# Patient Record
Sex: Male | Born: 1935 | Race: Black or African American | Hispanic: No | Marital: Married | Smoking: Never smoker
Health system: Southern US, Community
[De-identification: ages and names within clinical notes are randomized; demographics above are authoritative.]

## PROBLEM LIST (undated history)

## (undated) DIAGNOSIS — D473 Essential (hemorrhagic) thrombocythemia: Secondary | ICD-10-CM

## (undated) DIAGNOSIS — R911 Solitary pulmonary nodule: Secondary | ICD-10-CM

## (undated) DIAGNOSIS — K635 Polyp of colon: Secondary | ICD-10-CM

## (undated) DIAGNOSIS — E785 Hyperlipidemia, unspecified: Secondary | ICD-10-CM

## (undated) DIAGNOSIS — I10 Essential (primary) hypertension: Secondary | ICD-10-CM

## (undated) HISTORY — DX: Essential (hemorrhagic) thrombocythemia: D47.3

## (undated) HISTORY — DX: Hyperlipidemia, unspecified: E78.5

## (undated) HISTORY — DX: Essential (primary) hypertension: I10

## (undated) HISTORY — DX: Polyp of colon: K63.5

## (undated) HISTORY — DX: Solitary pulmonary nodule: R91.1

## (undated) HISTORY — PX: COLONOSCOPY: SHX174

---

## 2002-03-01 LAB — HM COLONOSCOPY

## 2004-12-30 DIAGNOSIS — R911 Solitary pulmonary nodule: Secondary | ICD-10-CM

## 2004-12-30 HISTORY — DX: Solitary pulmonary nodule: R91.1

## 2005-01-26 ENCOUNTER — Ambulatory Visit: Payer: Self-pay | Admitting: Internal Medicine

## 2005-06-08 ENCOUNTER — Other Ambulatory Visit: Payer: Self-pay

## 2005-06-08 ENCOUNTER — Ambulatory Visit: Payer: Self-pay | Admitting: Otolaryngology

## 2005-06-10 ENCOUNTER — Ambulatory Visit: Payer: Self-pay | Admitting: Otolaryngology

## 2005-06-11 HISTORY — PX: NASAL SINUS SURGERY: SHX719

## 2006-02-16 ENCOUNTER — Ambulatory Visit: Payer: Self-pay | Admitting: Gastroenterology

## 2006-04-27 ENCOUNTER — Ambulatory Visit: Payer: Self-pay | Admitting: Infectious Diseases

## 2006-09-22 ENCOUNTER — Other Ambulatory Visit: Payer: Self-pay

## 2006-09-22 ENCOUNTER — Emergency Department: Payer: Self-pay | Admitting: Emergency Medicine

## 2006-11-30 ENCOUNTER — Ambulatory Visit: Payer: Self-pay | Admitting: Internal Medicine

## 2006-12-14 ENCOUNTER — Ambulatory Visit: Payer: Self-pay | Admitting: Internal Medicine

## 2006-12-31 ENCOUNTER — Ambulatory Visit: Payer: Self-pay | Admitting: Internal Medicine

## 2007-01-18 HISTORY — PX: BONE MARROW BIOPSY: SHX199

## 2007-01-30 ENCOUNTER — Ambulatory Visit: Payer: Self-pay | Admitting: Internal Medicine

## 2007-03-02 ENCOUNTER — Ambulatory Visit: Payer: Self-pay | Admitting: Internal Medicine

## 2007-04-02 ENCOUNTER — Ambulatory Visit: Payer: Self-pay | Admitting: Internal Medicine

## 2007-04-19 ENCOUNTER — Ambulatory Visit: Payer: Self-pay | Admitting: Internal Medicine

## 2007-04-30 ENCOUNTER — Ambulatory Visit: Payer: Self-pay | Admitting: Internal Medicine

## 2007-05-08 ENCOUNTER — Ambulatory Visit: Payer: Self-pay | Admitting: Internal Medicine

## 2007-05-31 ENCOUNTER — Ambulatory Visit: Payer: Self-pay | Admitting: Internal Medicine

## 2007-06-30 ENCOUNTER — Ambulatory Visit: Payer: Self-pay | Admitting: Internal Medicine

## 2007-07-31 ENCOUNTER — Ambulatory Visit: Payer: Self-pay | Admitting: Internal Medicine

## 2007-08-09 ENCOUNTER — Ambulatory Visit: Payer: Self-pay | Admitting: Specialist

## 2007-08-30 ENCOUNTER — Ambulatory Visit: Payer: Self-pay | Admitting: Internal Medicine

## 2007-09-13 ENCOUNTER — Ambulatory Visit: Payer: Self-pay | Admitting: Internal Medicine

## 2007-09-30 ENCOUNTER — Ambulatory Visit: Payer: Self-pay | Admitting: Internal Medicine

## 2007-10-31 ENCOUNTER — Ambulatory Visit: Payer: Self-pay | Admitting: Internal Medicine

## 2007-11-30 ENCOUNTER — Ambulatory Visit: Payer: Self-pay | Admitting: Internal Medicine

## 2007-12-08 ENCOUNTER — Ambulatory Visit: Payer: Self-pay | Admitting: Orthopedic Surgery

## 2007-12-31 ENCOUNTER — Ambulatory Visit: Payer: Self-pay | Admitting: Internal Medicine

## 2007-12-31 ENCOUNTER — Ambulatory Visit: Payer: Self-pay | Admitting: Orthopedic Surgery

## 2008-01-30 ENCOUNTER — Ambulatory Visit: Payer: Self-pay | Admitting: Orthopedic Surgery

## 2008-01-30 ENCOUNTER — Ambulatory Visit: Payer: Self-pay | Admitting: Internal Medicine

## 2008-03-01 ENCOUNTER — Ambulatory Visit: Payer: Self-pay | Admitting: Internal Medicine

## 2008-03-05 ENCOUNTER — Ambulatory Visit: Payer: Self-pay | Admitting: Internal Medicine

## 2008-03-12 ENCOUNTER — Ambulatory Visit: Payer: Self-pay | Admitting: Gastroenterology

## 2008-04-01 ENCOUNTER — Ambulatory Visit: Payer: Self-pay | Admitting: Internal Medicine

## 2008-04-29 ENCOUNTER — Ambulatory Visit: Payer: Self-pay | Admitting: Internal Medicine

## 2008-05-30 ENCOUNTER — Ambulatory Visit: Payer: Self-pay | Admitting: Internal Medicine

## 2008-06-29 ENCOUNTER — Ambulatory Visit: Payer: Self-pay | Admitting: Internal Medicine

## 2008-07-16 ENCOUNTER — Ambulatory Visit: Payer: Self-pay | Admitting: Internal Medicine

## 2008-07-30 ENCOUNTER — Ambulatory Visit: Payer: Self-pay | Admitting: Internal Medicine

## 2008-08-29 ENCOUNTER — Ambulatory Visit: Payer: Self-pay | Admitting: Internal Medicine

## 2008-09-12 ENCOUNTER — Ambulatory Visit: Payer: Self-pay | Admitting: Internal Medicine

## 2008-09-29 ENCOUNTER — Ambulatory Visit: Payer: Self-pay | Admitting: Internal Medicine

## 2008-10-30 ENCOUNTER — Ambulatory Visit: Payer: Self-pay | Admitting: Internal Medicine

## 2008-11-03 ENCOUNTER — Emergency Department: Payer: Self-pay | Admitting: Emergency Medicine

## 2008-11-29 ENCOUNTER — Ambulatory Visit: Payer: Self-pay | Admitting: Internal Medicine

## 2008-12-24 ENCOUNTER — Ambulatory Visit: Payer: Self-pay | Admitting: Internal Medicine

## 2008-12-30 ENCOUNTER — Ambulatory Visit: Payer: Self-pay | Admitting: Internal Medicine

## 2009-01-29 ENCOUNTER — Ambulatory Visit: Payer: Self-pay | Admitting: Internal Medicine

## 2009-02-26 ENCOUNTER — Ambulatory Visit: Payer: Self-pay | Admitting: Internal Medicine

## 2009-03-01 ENCOUNTER — Ambulatory Visit: Payer: Self-pay | Admitting: Internal Medicine

## 2009-04-29 ENCOUNTER — Ambulatory Visit: Payer: Self-pay | Admitting: Internal Medicine

## 2009-05-27 ENCOUNTER — Ambulatory Visit: Payer: Self-pay | Admitting: Internal Medicine

## 2009-05-30 ENCOUNTER — Ambulatory Visit: Payer: Self-pay | Admitting: Internal Medicine

## 2009-07-30 ENCOUNTER — Ambulatory Visit: Payer: Self-pay | Admitting: Internal Medicine

## 2009-08-19 ENCOUNTER — Ambulatory Visit: Payer: Self-pay | Admitting: Internal Medicine

## 2009-08-29 ENCOUNTER — Ambulatory Visit: Payer: Self-pay | Admitting: Internal Medicine

## 2009-10-14 ENCOUNTER — Ambulatory Visit: Payer: Self-pay | Admitting: Ophthalmology

## 2009-10-21 ENCOUNTER — Ambulatory Visit: Payer: Self-pay | Admitting: Ophthalmology

## 2009-10-30 ENCOUNTER — Ambulatory Visit: Payer: Self-pay | Admitting: Internal Medicine

## 2009-11-25 ENCOUNTER — Ambulatory Visit: Payer: Self-pay | Admitting: Internal Medicine

## 2009-11-29 ENCOUNTER — Ambulatory Visit: Payer: Self-pay | Admitting: Internal Medicine

## 2010-03-03 ENCOUNTER — Ambulatory Visit: Payer: Self-pay | Admitting: Internal Medicine

## 2010-04-01 ENCOUNTER — Ambulatory Visit: Payer: Self-pay | Admitting: Internal Medicine

## 2010-04-14 ENCOUNTER — Ambulatory Visit: Payer: Self-pay | Admitting: Internal Medicine

## 2010-04-24 ENCOUNTER — Ambulatory Visit: Payer: Self-pay | Admitting: Internal Medicine

## 2010-06-09 ENCOUNTER — Ambulatory Visit: Payer: Self-pay | Admitting: Internal Medicine

## 2010-06-30 ENCOUNTER — Ambulatory Visit: Payer: Self-pay | Admitting: Internal Medicine

## 2010-09-22 ENCOUNTER — Ambulatory Visit: Payer: Self-pay | Admitting: Internal Medicine

## 2010-09-30 ENCOUNTER — Ambulatory Visit: Payer: Self-pay | Admitting: Internal Medicine

## 2010-10-13 ENCOUNTER — Encounter: Payer: Self-pay | Admitting: Internal Medicine

## 2010-11-20 ENCOUNTER — Ambulatory Visit (INDEPENDENT_AMBULATORY_CARE_PROVIDER_SITE_OTHER): Payer: PRIVATE HEALTH INSURANCE | Admitting: Internal Medicine

## 2010-11-20 ENCOUNTER — Encounter: Payer: Self-pay | Admitting: Internal Medicine

## 2010-11-20 DIAGNOSIS — L723 Sebaceous cyst: Secondary | ICD-10-CM

## 2010-11-20 DIAGNOSIS — L729 Follicular cyst of the skin and subcutaneous tissue, unspecified: Secondary | ICD-10-CM

## 2010-11-20 MED ORDER — DOXYCYCLINE HYCLATE 50 MG PO CAPS: 50.0000 mg | ORAL_CAPSULE | Freq: Two times a day (BID) | ORAL | Status: AC

## 2010-11-20 NOTE — Patient Instructions (Signed)
You may change the dressing tomorrow and apply a bandage.  There were no tick particles found in the cyst.   I have prescribed doxycycline to take twice daily with food for the next 7 days to prevent infection.

## 2010-11-20 NOTE — Progress Notes (Signed)
Subjective:    Patient ID: Jimmy Jimenez, male    DOB: 03/13/1925, 77 y.o.   MRN: [id number]  HPI  77 yo AA male with history of ET, hypertension, presents with small cyst on his shoulder that has been present for one to two months and occurred after his wife removed a tick from the site. He is concerned that there are retained tick parts in the area that is causing the swelling.  He denies redness, pain, arthralgias, rashes  and fevers since receiving the tick bite.   Past Medical History  Diagnosis Date  . Lung nodule 12/2004    found on CXR  . Essential thrombocytosis   . Hyperlipidemia   . Hypertension   . Osteoporosis    Current Outpatient Prescriptions on File Prior to Visit  Medication Sig Dispense Refill  . alendronate (FOSAMAX) 70 MG tablet Take 70 mg by mouth every 7 (seven) days. Take with a full glass of water on an empty stomach.       [name] aspirin 81 MG tablet Take 81 mg by mouth daily.        . calcium carbonate (OS-CAL) 600 MG TABS Take 600 mg by mouth 2 (two) times daily with a meal.        . hydrALAZINE (APRESOLINE) 50 MG tablet Take 50 mg by mouth 3 (three) times daily.        [name] lisinopril (PRINIVIL,ZESTRIL) 40 MG tablet Take 40 mg by mouth daily.        [name] losartan (COZAAR) 100 MG tablet Take 100 mg by mouth daily.        [name] lovastatin (MEVACOR) 40 MG tablet Take 40 mg by mouth at bedtime.        . Nebivolol HCl (BYSTOLIC) 20 MG TABS Take 1 tablet by mouth daily.           Review of Systems  Constitutional: Negative for fever, chills, diaphoresis, activity change, appetite change, fatigue and unexpected weight change.  HENT: Negative for hearing loss, ear pain, nosebleeds, congestion, sore throat, facial swelling, rhinorrhea, sneezing, drooling, mouth sores, trouble swallowing, neck pain, neck stiffness, dental problem, voice change, postnasal drip, sinus pressure, tinnitus and ear discharge.   Eyes: Negative for photophobia, pain, discharge, redness, itching and visual  disturbance.  Respiratory: Negative for apnea, cough, choking, chest tightness, shortness of breath, wheezing and stridor.   Cardiovascular: Negative for chest pain, palpitations and leg swelling.  Gastrointestinal: Negative for nausea, vomiting, abdominal pain, diarrhea, constipation, blood in stool, abdominal distention, anal bleeding and rectal pain.  Genitourinary: Negative for dysuria, urgency, frequency, hematuria, flank pain, decreased urine volume, scrotal swelling, difficulty urinating and testicular pain.  Musculoskeletal: Negative for myalgias, back pain, joint swelling, arthralgias and gait problem.  Skin: Negative for color change, rash and wound.  Neurological: Negative for dizziness, tremors, seizures, syncope, speech difficulty, weakness, light-headedness, numbness and headaches.  Psychiatric/Behavioral: Negative for suicidal ideas, hallucinations, behavioral problems, confusion, sleep disturbance, dysphoric mood, decreased concentration and agitation. The patient is not nervous/anxious.   All other systems reviewed and are negative.      BP 171/72  Pulse 57  Temp(Src) 98.2 F (36.8 C) (Oral)  Resp 14  Wt 183 lb 4 oz (83.122 kg)  SpO2 96%  Objective:   Physical Exam  Constitutional: He is oriented to person, place, and time.  HENT:  Head: Normocephalic and atraumatic.  Mouth/Throat: Oropharynx is clear and moist.  Eyes: Conjunctivae and EOM are normal.  Neck: Normal range of  motion. Neck supple. No JVD present. No thyromegaly present.  Cardiovascular: Normal rate, regular rhythm and normal heart sounds.   Pulmonary/Chest: Effort normal and breath sounds normal. He has no wheezes. He has no rales.  Abdominal: Soft. Bowel sounds are normal. He exhibits no mass. There is no tenderness. There is no rebound.  Musculoskeletal: Normal range of motion. He exhibits no edema.  Neurological: He is alert and oriented to person, place, and time.  Skin: Skin is warm and dry.       Psychiatric: He has a normal mood and affect.          Assessment & Plan:  Cyst:  The cyst was incised and probed today under sterile conditions after informed consent was obtained.  No immediate vomplicatiosn and no tick parts were found.                                                                                     [facility] [address] [place] [address] Phone: [phone] Fax: [phone]   11/20/2010   I hereby consent to, and authorize:  Duncan Dull, MD  to perform the following procedure:   Incision and drainage of cyst  on patient:  Jimmy Jimenez [id number]   utilizing the following anesthetic or anesthesia:  Local  administered by the following individual:  Duncan Dull, MD  My physician has explained the following to me in language that I understand:  The nature of the treatment/procedure, the risks of the procedure, the possible complications of the procedure, the expected benefits or effects of the treatment/procedure, and any alternatives to the procedure and their risks and benefits.  I understand that no guarantees have been made to me concerning the results of treatment, surgery, or other procedures.  If unforeseen conditions require additional procedures, and it is not reasonably practical to obtain my consent, I authorize my physician to proceed as he/she considers advisable and in my best interest, unless otherwise specified as follows.  I have read the previous information, and I understand it.  Any questions which may have occurred to me have been answered to my satisfaction.     _______________________________________________  ________________  ___________ Patient Signature        Date        Time  _______________________________________________  ________________  ___________ Print Name of Parent/Guardian/Legal Representative    Date         Time (relationship)  _______________________________________________   Signature of Parent/Guardian/Legal Representative      _______________________________________________  ________________  ___________ Print Name of Witness       Date       Time  _______________________________________________ Signature of Witness

## 2010-11-21 ENCOUNTER — Encounter: Payer: Self-pay | Admitting: Internal Medicine

## 2010-11-21 DIAGNOSIS — I10 Essential (primary) hypertension: Secondary | ICD-10-CM | POA: Insufficient documentation

## 2010-11-21 DIAGNOSIS — L729 Follicular cyst of the skin and subcutaneous tissue, unspecified: Secondary | ICD-10-CM | POA: Insufficient documentation

## 2010-11-21 DIAGNOSIS — D473 Essential (hemorrhagic) thrombocythemia: Secondary | ICD-10-CM | POA: Insufficient documentation

## 2010-11-21 DIAGNOSIS — E785 Hyperlipidemia, unspecified: Secondary | ICD-10-CM | POA: Insufficient documentation

## 2010-11-21 DIAGNOSIS — M810 Age-related osteoporosis without current pathological fracture: Secondary | ICD-10-CM | POA: Insufficient documentation

## 2010-11-21 NOTE — Assessment & Plan Note (Signed)
The cyst on his shoulder occurred after his wife removed a tick 2 months ago.  After receiving informed consent,  The area was cleaned with alcohol, injected with lidocaine/epi, and opened an d probed with a scalpel.  No tick body parts were founf and only cear fluid was obtained above very fibrous tissue.  Minimal bleeding occurred. Empiric doxycyline to cover against MRSA was rxd for one week.

## 2010-11-25 ENCOUNTER — Telehealth: Payer: Self-pay | Admitting: *Deleted

## 2010-11-25 NOTE — Telephone Encounter (Signed)
Patient notified

## 2010-11-25 NOTE — Telephone Encounter (Signed)
Patient was started on doxycycline on Friday. He says that since he has been itching allover and thinks that it is from the medication. He is asking if he can be switched to something else. Uses [facility] [address].

## 2010-11-25 NOTE — Telephone Encounter (Signed)
Yes, please have him stop the doxycyline.,  He does not need any more antibiotics.

## 2011-01-01 ENCOUNTER — Other Ambulatory Visit: Payer: Self-pay | Admitting: Internal Medicine

## 2011-01-01 DIAGNOSIS — I10 Essential (primary) hypertension: Secondary | ICD-10-CM

## 2011-01-01 NOTE — Telephone Encounter (Signed)
Wife called req RF of bystolic. She says patient was told to take 2 a day if BP is over 150.

## 2011-01-01 NOTE — Telephone Encounter (Signed)
Refill sent.

## 2011-01-05 ENCOUNTER — Other Ambulatory Visit: Payer: Self-pay | Admitting: Internal Medicine

## 2011-01-19 ENCOUNTER — Ambulatory Visit: Payer: Self-pay | Admitting: Internal Medicine

## 2011-01-30 ENCOUNTER — Ambulatory Visit: Payer: Self-pay | Admitting: Internal Medicine

## 2011-02-16 ENCOUNTER — Telehealth: Payer: Self-pay | Admitting: Internal Medicine

## 2011-02-16 ENCOUNTER — Ambulatory Visit (INDEPENDENT_AMBULATORY_CARE_PROVIDER_SITE_OTHER): Payer: Medicare Other | Admitting: Internal Medicine

## 2011-02-16 ENCOUNTER — Encounter: Payer: Self-pay | Admitting: Internal Medicine

## 2011-02-16 VITALS — BP 156/60 | HR 71 | Temp 98.4°F | Wt 188.0 lb

## 2011-02-16 DIAGNOSIS — M545 Low back pain: Secondary | ICD-10-CM

## 2011-02-16 DIAGNOSIS — R6883 Chills (without fever): Secondary | ICD-10-CM

## 2011-02-16 DIAGNOSIS — J069 Acute upper respiratory infection, unspecified: Secondary | ICD-10-CM | POA: Insufficient documentation

## 2011-02-16 LAB — POCT URINALYSIS DIPSTICK
Blood, UA: NEGATIVE
Spec Grav, UA: 1.02
Urobilinogen, UA: 4
pH, UA: 6

## 2011-02-16 MED ORDER — DOXYCYCLINE HYCLATE 100 MG PO TABS: 100.0000 mg | ORAL_TABLET | Freq: Two times a day (BID) | ORAL | Status: AC

## 2011-02-16 MED ORDER — BENZONATATE 200 MG PO CAPS: 200.0000 mg | ORAL_CAPSULE | Freq: Two times a day (BID) | ORAL | Status: AC | PRN

## 2011-02-16 NOTE — Telephone Encounter (Signed)
Patient's wife called stating that the patient has low back pain, no burning with urination or other urinary symptoms. Patient also has a productive cough-white, no fever, but thinks that he may have the flu. Please advise. Pharmacy [facility]

## 2011-02-16 NOTE — Telephone Encounter (Signed)
[phone] Pt called wanted to be seen today for  Back pain/chest congestion/

## 2011-02-16 NOTE — Progress Notes (Signed)
Subjective:    Patient ID: Jimmy Jimenez, male    DOB: 03/13/1925, 77 y.o.   MRN: [id number]  HPI Jimmy Jimenez is an elderly male with a hitory of hypertensin, ET who presents with cold symptoms for 2 weeks,including cough productive of yellow sputum,,  Nasal drainage often streaked with blood, and occasional back pain.  He has ben using mucines and a sinus rinse twice daily  With no major improvement.  Denies feeers and myalgias.  Has had his flu shot./   Past Medical History  Diagnosis Date  . Lung nodule 12/2004    found on CXR  . Essential thrombocytosis   . Hyperlipidemia   . Hypertension   . Osteoporosis   . Essential thrombocytosis    Current Outpatient Prescriptions on File Prior to Visit  Medication Sig Dispense Refill  . alendronate (FOSAMAX) 70 MG tablet Take 70 mg by mouth every 7 (seven) days. Take with a full glass of water on an empty stomach.       [name] aspirin 81 MG tablet Take 81 mg by mouth daily.        [name] BYSTOLIC 20 MG TABS TAKE ONE TABLET BY MOUTH EVERY DAY  45 each  5  . calcium carbonate (OS-CAL) 600 MG TABS Take 600 mg by mouth 2 (two) times daily with a meal.        . hydrALAZINE (APRESOLINE) 50 MG tablet Take 50 mg by mouth 3 (three) times daily.        [name] lisinopril (PRINIVIL,ZESTRIL) 40 MG tablet Take 40 mg by mouth daily.        [name] losartan (COZAAR) 100 MG tablet Take 100 mg by mouth daily.        [name] lovastatin (MEVACOR) 40 MG tablet Take 40 mg by mouth at bedtime.          Review of Systems  Constitutional: Negative for fever, chills, diaphoresis, activity change, appetite change, fatigue and unexpected weight change.  HENT: Positive for congestion and postnasal drip. Negative for hearing loss, ear pain, nosebleeds, sore throat, facial swelling, rhinorrhea, sneezing, drooling, mouth sores, trouble swallowing, neck pain, neck stiffness, dental problem, voice change, sinus pressure, tinnitus and ear discharge.   Eyes: Negative for photophobia, pain, discharge,  redness, itching and visual disturbance.  Respiratory: Positive for cough. Negative for apnea, choking, chest tightness, shortness of breath, wheezing and stridor.   Cardiovascular: Negative for chest pain, palpitations and leg swelling.  Gastrointestinal: Negative for nausea, vomiting, abdominal pain, diarrhea, constipation, blood in stool, abdominal distention, anal bleeding and rectal pain.  Genitourinary: Negative for dysuria, urgency, frequency, hematuria, flank pain, decreased urine volume, scrotal swelling, difficulty urinating and testicular pain.  Musculoskeletal: Positive for back pain. Negative for myalgias, joint swelling, arthralgias and gait problem.  Skin: Negative for color change, rash and wound.  Neurological: Negative for dizziness, tremors, seizures, syncope, speech difficulty, weakness, light-headedness, numbness and headaches.  Psychiatric/Behavioral: Negative for suicidal ideas, hallucinations, behavioral problems, confusion, sleep disturbance, dysphoric mood, decreased concentration and agitation. The patient is not nervous/anxious.        Objective:   Physical Exam  Constitutional: He is oriented to person, place, and time.  HENT:  Head: Normocephalic and atraumatic.  Mouth/Throat: Oropharynx is clear and moist.  Eyes: Conjunctivae and EOM are normal.  Neck: Normal range of motion. Neck supple. No JVD present. No thyromegaly present.  Cardiovascular: Normal rate, regular rhythm and normal heart sounds.   Pulmonary/Chest: Effort normal and breath sounds normal. He has  no wheezes. He has no rales.  Abdominal: Soft. Bowel sounds are normal. He exhibits no mass. There is no tenderness. There is no rebound.  Musculoskeletal: Normal range of motion. He exhibits no edema.  Neurological: He is alert and oriented to person, place, and time.  Skin: Skin is warm and dry.  Psychiatric: He has a normal mood and affect.          Assessment & Plan:

## 2011-02-16 NOTE — Patient Instructions (Addendum)
I am treating your with doxycycline,  an antibiotic for an upper respiratory infection, and tessalon,  A pill for the cough.  Continue the sinus rinse and mucinex and try moistening the inside of your nose with vaseline on a q  tip

## 2011-02-16 NOTE — Telephone Encounter (Signed)
Please give him the 4:14 SLOT TODAY,  REMIND HIM THAT THIS IS A QUICK VISIT.  GET A RAPID FLU TEST ON HIM AND A UA AS SOON AS HE COMES IN SO I HAVE THE RESULTS WHEN I SEE HIM

## 2011-02-16 NOTE — Assessment & Plan Note (Signed)
No symptoms concerning for influenza, but given the chronicity of his symptoms will treat empirically for mucopurulent bronchitis with doxycycline

## 2011-02-16 NOTE — Telephone Encounter (Signed)
Patient notified as instructed by telephone. Appointment scheduled for today per Dr. Darrick Huntsman.

## 2011-02-16 NOTE — Telephone Encounter (Signed)
Message left on machine for patient to call back.

## 2011-02-17 NOTE — Progress Notes (Signed)
Addended by: Jobie Quaker on: 02/17/2011 05:58 PM   Modules accepted: Orders

## 2011-03-23 ENCOUNTER — Other Ambulatory Visit: Payer: Self-pay | Admitting: Internal Medicine

## 2011-04-23 ENCOUNTER — Ambulatory Visit (INDEPENDENT_AMBULATORY_CARE_PROVIDER_SITE_OTHER): Payer: Medicare Other | Admitting: Internal Medicine

## 2011-04-23 ENCOUNTER — Encounter: Payer: Self-pay | Admitting: Internal Medicine

## 2011-04-23 VITALS — BP 122/52 | HR 50 | Temp 98.9°F | Wt 184.0 lb

## 2011-04-23 DIAGNOSIS — J069 Acute upper respiratory infection, unspecified: Secondary | ICD-10-CM

## 2011-04-23 DIAGNOSIS — J329 Chronic sinusitis, unspecified: Secondary | ICD-10-CM

## 2011-04-23 MED ORDER — AZITHROMYCIN 500 MG PO TABS: 500.0000 mg | ORAL_TABLET | Freq: Every day | ORAL | Status: DC

## 2011-04-23 MED ORDER — NEBIVOLOL HCL 20 MG PO TABS: 1.0000 | ORAL_TABLET | Freq: Every day | ORAL | Status: DC

## 2011-04-23 NOTE — Patient Instructions (Addendum)
Please use Benadryl (dipenhydramine) 25 mg every 8 hours for the runny nose,  And  Use Afrin nasal spray once ot twice daily for 5 days total for nasal congestion  I wil call in an antibiotic to take for 7 days  Azithromycin  one tablet  Daily for 7 days   After you finish the antbiotic, you may continue the benadryl for treatment of allergic rhinitis. If it makes you too sleepy, you can try OTC Allegra instead or claritin or zyrtec ;  They areall once daily antihistamines for allergic rhinitis.

## 2011-04-23 NOTE — Progress Notes (Signed)
Subjective:    Patient ID: Jimmy Jimenez, male    DOB: 03/13/1925, 78 y.o.   MRN: [id number]  HPI  Jimmy Jimenez is an 78 yr old white male with a history of hypertension, Essential thrombocytosis who presents with a 2 week history of persistent sinus congestion and drainage.  Symptoms are accompanied bynonproductive cough  purulent drainage occasionally streaked with blood.  He has been using saline nasal rinse twice daily since his last episode of sinusitis two months ago.  Symptoms have imporved with last antibiotic treatment using doxycycline but never completely resolved, and he felt a significant change in symptoms 2 weeks ago.   Past Medical History  Diagnosis Date  . Lung nodule 12/2004    found on CXR  . Essential thrombocytosis   . Hyperlipidemia   . Hypertension   . Osteoporosis   . Essential thrombocytosis    Current Outpatient Prescriptions on File Prior to Visit  Medication Sig Dispense Refill  . alendronate (FOSAMAX) 70 MG tablet Take 70 mg by mouth every 7 (seven) days. Take with a full glass of water on an empty stomach.       [name] aspirin 81 MG tablet Take 81 mg by mouth daily.        . calcium carbonate (OS-CAL) 600 MG TABS Take 600 mg by mouth 2 (two) times daily with a meal.        . hydrALAZINE (APRESOLINE) 50 MG tablet Take 50 mg by mouth 3 (three) times daily.        [name] lisinopril (PRINIVIL,ZESTRIL) 40 MG tablet Take 40 mg by mouth daily.        [name] losartan (COZAAR) 100 MG tablet TAKE ONE TABLET BY MOUTH EVERY DAY  30 tablet  5  . lovastatin (MEVACOR) 40 MG tablet Take 40 mg by mouth at bedtime.            Review of Systems  Constitutional: Negative for fever, chills, diaphoresis, activity change, appetite change, fatigue and unexpected weight change.  HENT: Positive for congestion, rhinorrhea and postnasal drip. Negative for hearing loss, ear pain, nosebleeds, sore throat, facial swelling, sneezing, drooling, mouth sores, trouble swallowing, neck pain, neck stiffness,  dental problem, voice change, sinus pressure, tinnitus and ear discharge.   Eyes: Negative for photophobia, pain, discharge, redness, itching and visual disturbance.  Respiratory: Positive for cough. Negative for apnea, choking, chest tightness, shortness of breath, wheezing and stridor.   Cardiovascular: Negative for chest pain, palpitations and leg swelling.  Gastrointestinal: Negative for nausea, vomiting, abdominal pain, diarrhea, constipation, blood in stool, abdominal distention, anal bleeding and rectal pain.  Genitourinary: Negative for dysuria, urgency, frequency, hematuria, flank pain, decreased urine volume, scrotal swelling, difficulty urinating and testicular pain.  Musculoskeletal: Negative for myalgias, back pain, joint swelling, arthralgias and gait problem.  Skin: Negative for color change, rash and wound.  Neurological: Negative for dizziness, tremors, seizures, syncope, speech difficulty, weakness, light-headedness, numbness and headaches.  Psychiatric/Behavioral: Negative for suicidal ideas, hallucinations, behavioral problems, confusion, sleep disturbance, dysphoric mood, decreased concentration and agitation. The patient is not nervous/anxious.        Objective:   Physical Exam  Constitutional: He is oriented to person, place, and time.  HENT:  Head: Normocephalic and atraumatic.  Mouth/Throat: Oropharynx is clear and moist.  Eyes: Conjunctivae and EOM are normal.  Neck: Normal range of motion. Neck supple. No JVD present. No thyromegaly present.  Cardiovascular: Normal rate, regular rhythm and normal heart sounds.   Pulmonary/Chest: Effort normal and  breath sounds normal. He has no wheezes. He has no rales.  Abdominal: Soft. Bowel sounds are normal. He exhibits no mass. There is no tenderness. There is no rebound.  Musculoskeletal: Normal range of motion. He exhibits no edema.  Neurological: He is alert and oriented to person, place, and time.  Skin: Skin is warm and  dry.  Psychiatric: He has a normal mood and affect.      Assessment & Plan:   Upper respiratory infection Currently appears to be viral syndrome,  Will treat symptoms, add azithromycin in a few days if no improvement     Updated Medication List Outpatient Encounter Prescriptions as of 04/23/2011  Medication Sig Dispense Refill  . alendronate (FOSAMAX) 70 MG tablet Take 70 mg by mouth every 7 (seven) days. Take with a full glass of water on an empty stomach.       [name] aspirin 81 MG tablet Take 81 mg by mouth daily.        . calcium carbonate (OS-CAL) 600 MG TABS Take 600 mg by mouth 2 (two) times daily with a meal.        . Calcium Citrate-Vitamin D (CITRACAL + D PO) Take 2 by mouth daily, calcium 630 and vitamin D 500 each.      . Cholecalciferol (VITAMIN D3) 1000 UNITS CAPS Take 2 by mouth daily      . hydrALAZINE (APRESOLINE) 50 MG tablet Take 50 mg by mouth 3 (three) times daily.        [name] lisinopril (PRINIVIL,ZESTRIL) 40 MG tablet Take 40 mg by mouth daily.        [name] losartan (COZAAR) 100 MG tablet TAKE ONE TABLET BY MOUTH EVERY DAY  30 tablet  5  . lovastatin (MEVACOR) 40 MG tablet Take 40 mg by mouth at bedtime.        . Nebivolol HCl (BYSTOLIC) 20 MG TABS Take 1 tablet (20 mg total) by mouth daily.  60 each  5  . DISCONTD: BYSTOLIC 20 MG TABS TAKE ONE TABLET BY MOUTH EVERY DAY  45 each  5  . azithromycin (ZITHROMAX) 500 MG tablet Take 1 tablet (500 mg total) by mouth daily.  7 tablet  0

## 2011-04-25 NOTE — Assessment & Plan Note (Signed)
Currently appears to be viral syndrome,  Will treat symptoms, add azithromycin in a few days if no improvement

## 2011-05-19 ENCOUNTER — Ambulatory Visit: Payer: Self-pay | Admitting: Internal Medicine

## 2011-05-19 LAB — COMPREHENSIVE METABOLIC PANEL
Albumin: 4.1 g/dL (ref 3.4–5.0)
Alkaline Phosphatase: 74 U/L (ref 50–136)
Anion Gap: 5 — ABNORMAL LOW (ref 7–16)
BUN: 8 mg/dL (ref 7–18)
Bilirubin,Total: 1.2 mg/dL — ABNORMAL HIGH (ref 0.2–1.0)
Chloride: 99 mmol/L (ref 98–107)
Co2: 32 mmol/L (ref 21–32)
EGFR (African American): >60
EGFR (Non-African Amer.): >60
Potassium: 4.3 mmol/L (ref 3.5–5.1)
SGOT(AST): 27 U/L (ref 15–37)
SGPT (ALT): 21 U/L
Total Protein: 7.4 g/dL (ref 6.4–8.2)

## 2011-05-19 LAB — CBC CANCER CENTER
Basophil %: 0.4 %
Eosinophil #: 0.2 x10 3/mm (ref 0.0–0.7)
Eosinophil %: 3.2 %
HCT: 42.1 % (ref 40.0–52.0)
HGB: 14.4 g/dL (ref 13.0–18.0)
Lymphocyte #: 1.8 x10 3/mm (ref 1.0–3.6)
MCH: 37.9 pg — ABNORMAL HIGH (ref 26.0–34.0)
MCHC: 34.3 g/dL (ref 32.0–36.0)
MCV: 111 fL — ABNORMAL HIGH (ref 80–100)
Monocyte #: 0.6 x10 3/mm (ref 0.0–0.7)
Neutrophil #: 4.6 x10 3/mm (ref 1.4–6.5)
RBC: 3.8 10*6/uL — ABNORMAL LOW (ref 4.40–5.90)

## 2011-05-23 ENCOUNTER — Other Ambulatory Visit: Payer: Self-pay | Admitting: Internal Medicine

## 2011-05-31 ENCOUNTER — Ambulatory Visit: Payer: Self-pay | Admitting: Internal Medicine

## 2011-07-01 ENCOUNTER — Telehealth: Payer: Self-pay | Admitting: Internal Medicine

## 2011-07-01 ENCOUNTER — Other Ambulatory Visit: Payer: Self-pay | Admitting: Internal Medicine

## 2011-07-01 MED ORDER — ALENDRONATE SODIUM 70 MG PO TABS: 70.0000 mg | ORAL_TABLET | ORAL | Status: DC

## 2011-07-01 NOTE — Telephone Encounter (Signed)
Done

## 2011-07-01 NOTE — Telephone Encounter (Signed)
Rx called in 

## 2011-07-01 NOTE — Telephone Encounter (Signed)
[phone] Pt spouse called he needs refill on alendronate [facility] [address]

## 2011-09-03 ENCOUNTER — Other Ambulatory Visit: Payer: Self-pay | Admitting: Internal Medicine

## 2011-09-03 NOTE — Telephone Encounter (Signed)
Spouse calling for refill on hydralazine 50 mg, has enough to last throughout the weekend, uses [facility] [phone], has already contacted pharmacy.

## 2011-09-07 ENCOUNTER — Other Ambulatory Visit: Payer: Self-pay | Admitting: Internal Medicine

## 2011-09-08 ENCOUNTER — Ambulatory Visit: Payer: Self-pay | Admitting: Internal Medicine

## 2011-09-08 LAB — CBC CANCER CENTER
Basophil %: 0.7 %
Eosinophil #: 0.2 x10 3/mm (ref 0.0–0.7)
Eosinophil %: 2.4 %
Lymphocyte #: 1.7 x10 3/mm (ref 1.0–3.6)
MCHC: 33.4 g/dL (ref 32.0–36.0)
MCV: 113 fL — ABNORMAL HIGH (ref 80–100)
Monocyte #: 0.7 x10 3/mm (ref 0.2–1.0)
Platelet: 230 x10 3/mm (ref 150–440)
RDW: 12.4 % (ref 11.5–14.5)
WBC: 6.8 x10 3/mm (ref 3.8–10.6)

## 2011-09-08 LAB — HEPATIC FUNCTION PANEL A (ARMC)
Alkaline Phosphatase: 76 U/L (ref 50–136)
Bilirubin,Total: 1.1 mg/dL — ABNORMAL HIGH (ref 0.2–1.0)
SGPT (ALT): 20 U/L
Total Protein: 7.3 g/dL (ref 6.4–8.2)

## 2011-09-08 LAB — CREATININE, SERUM
Creatinine: 1.17 mg/dL (ref 0.60–1.30)
EGFR (African American): >60
EGFR (Non-African Amer.): 57 — ABNORMAL LOW

## 2011-09-23 ENCOUNTER — Other Ambulatory Visit: Payer: Self-pay | Admitting: Internal Medicine

## 2011-09-30 ENCOUNTER — Ambulatory Visit: Payer: Self-pay | Admitting: Internal Medicine

## 2011-10-07 ENCOUNTER — Encounter: Payer: Self-pay | Admitting: Internal Medicine

## 2011-10-28 ENCOUNTER — Encounter: Payer: Self-pay | Admitting: Internal Medicine

## 2011-10-28 ENCOUNTER — Ambulatory Visit (INDEPENDENT_AMBULATORY_CARE_PROVIDER_SITE_OTHER): Payer: Medicare Other | Admitting: Internal Medicine

## 2011-10-28 VITALS — BP 158/64 | HR 50 | Temp 98.3°F | Resp 14 | Wt 184.5 lb

## 2011-10-28 DIAGNOSIS — L309 Dermatitis, unspecified: Secondary | ICD-10-CM

## 2011-10-28 DIAGNOSIS — D473 Essential (hemorrhagic) thrombocythemia: Secondary | ICD-10-CM

## 2011-10-28 DIAGNOSIS — L259 Unspecified contact dermatitis, unspecified cause: Secondary | ICD-10-CM

## 2011-10-28 DIAGNOSIS — R609 Edema, unspecified: Secondary | ICD-10-CM

## 2011-10-28 DIAGNOSIS — R600 Localized edema: Secondary | ICD-10-CM

## 2011-10-28 DIAGNOSIS — I10 Essential (primary) hypertension: Secondary | ICD-10-CM

## 2011-10-28 MED ORDER — HYDROCHLOROTHIAZIDE 12.5 MG PO TABS: ORAL_TABLET | ORAL | Status: DC

## 2011-10-28 NOTE — Progress Notes (Signed)
Patient ID: Jimmy Jimenez, male   DOB: 03/13/1925, 78 y.o.   MRN: [id number]  Patient Active Problem List  Diagnosis  . Essential thrombocytosis  . Hyperlipidemia  . Hypertension  . Osteoporosis  . Upper respiratory infection  . Edema of both legs  . Dermatitis    Subjective:  CC:   Chief Complaint  Patient presents with  . Joint Swelling    HPI:   Jimmy Jimenez a 78 y.o. male who presents for followup on hypertension.   He has been having bilateral ankle edema accompanied by itching of lower extremities.  Symptoms have been present for 5 months. No new medications or insect bites.  No redness,  joint pain , fevers or malaise. He wears compression knee highs occasionally but edema occurs in spite of wearing them.  The edema is Somewhat better in the morning,  but becomes worse as the day progressions.   it is partially relieved with leg elevation. He has developed itching and scaling of the skin in his lower extremities bilaterally. He has been applying hydrocortisone cream with no significant change in symptoms or appearance.     Past Medical History  Diagnosis Date  . Lung nodule 12/2004    found on CXR  . Essential thrombocytosis   . Hyperlipidemia   . Hypertension   . Osteoporosis   . Essential thrombocytosis     Past Surgical History  Procedure Date  . Nasal sinus surgery          The following portions of the patient's history were reviewed and updated as appropriate: Allergies, current medications, and problem list.    Review of Systems:   12 Pt  review of systems was negative except those addressed in the HPI,     History   Social History  . Marital Status: Married    Spouse Name: N/A    Number of Children: N/A  . Years of Education: N/A   Occupational History  . Retired     former Hotel manager, Electronics engineer   Social History Main Topics  . Smoking status: Never Smoker   . Smokeless tobacco: Never Used  . Alcohol Use: No  . Drug Use: No  .  Sexually Active: Not on file   Other Topics Concern  . Not on file   Social History Narrative  . No narrative on file    Objective:  BP 158/64  Pulse 50  Temp 98.3 F (36.8 C) (Oral)  Resp 14  Wt 184 lb 8 oz (83.689 kg)  SpO2 93%  General appearance: alert, cooperative and appears stated age Neck: no adenopathy, no carotid bruit, supple, symmetrical, trachea midline and thyroid not enlarged, symmetric, no tenderness/mass/nodules Back: symmetric, no curvature. ROM normal. No CVA tenderness. Lungs: clear to auscultation bilaterally Heart: regular rate and rhythm, S1, S2 normal, no murmur, click, rub or gallop Abdomen: soft, non-tender; bowel sounds normal; no masses,  no organomegaly Pulses: 2+ and symmetric Skin: Skin color, normal.  Nonpitting edema to lower third tibia bilaterally. Dry skin noted. No rashes or lesions.  MSK: Full range of motion in both ankles without pain or effusions.  Lymph nodes: Cervical, supraclavicular, and axillary nodes normal.  Assessment and Plan:  Edema of both legs Secondary to underlying venous insufficiency, aggravated by use of vasodilators for management of hypertension. He has multiple intolerances to alternative antihypertensives including dizziness and electrolyte deficiencies to amlodipine and furosemide as well as Maxzide. I recommended continued use of compression stockings daily and use of low-dose  hydrochlorothiazide every other day.  Dermatitis He has no rash on exam only excessively dry skin. Recommended he stop using the hydrocortisone cream start using a good moisturizer like Eucerin.   Essential thrombocytosis His myeloproliferative disorder is essential thrombocytosis ptosis/polycythemia vera. Managed with hydroxyurea. He has been reassigned to Dr. candidate after the loss of Dr. Haze Rushing left [facility] He has had no recent TIAs and his counts are stable. Ideally he should have CBCs every 2 months but he does not want to go to the  cancer Center more than every 4 months.  Hypertension His blood pressure is elevated today but her home checks he is within range for his age on current regimen. Renal function is checked today as well as electrolytes. He will return in one month for repeat the medicines we are resuming every other day hydrochlorothiazide for management of edema.   Updated Medication List Outpatient Encounter Prescriptions as of 10/28/2011  Medication Sig Dispense Refill  . alendronate (FOSAMAX) 70 MG tablet Take 1 tablet (70 mg total) by mouth every 7 (seven) days. Take with a full glass of water on an empty stomach.  12 tablet  1  . aspirin 81 MG tablet Take 81 mg by mouth daily.        . Calcium Citrate-Vitamin D (CITRACAL + D PO) Take 2 by mouth daily, calcium 630 and vitamin D 500 each.      . Cholecalciferol (VITAMIN D3) 1000 UNITS CAPS Take 2 by mouth daily      . hydrALAZINE (APRESOLINE) 50 MG tablet TAKE ONE TABLET BY MOUTH THREE TIMES DAILY  270 tablet  3  . hydroxyurea (HYDREA) 500 MG capsule Take 500 mg by mouth daily. May take with food to minimize GI side effects.      [name] lisinopril (PRINIVIL,ZESTRIL) 40 MG tablet TAKE ONE TABLET BY MOUTH EVERY DAY  90 tablet  3  . losartan (COZAAR) 100 MG tablet TAKE ONE TABLET BY MOUTH EVERY DAY  30 tablet  5  . lovastatin (MEVACOR) 40 MG tablet TAKE ONE TABLET BY MOUTH EVERY DAY  90 tablet  2  . Nebivolol HCl (BYSTOLIC) 20 MG TABS Take 1 tablet (20 mg total) by mouth daily.  60 each  5  . hydrochlorothiazide (HYDRODIURIL) 12.5 MG tablet Every other day  In the morning  30 tablet  1  . DISCONTD: azithromycin (ZITHROMAX) 500 MG tablet Take 1 tablet (500 mg total) by mouth daily.  7 tablet  0  . DISCONTD: calcium carbonate (OS-CAL) 600 MG TABS Take 600 mg by mouth 2 (two) times daily with a meal.

## 2011-10-28 NOTE — Patient Instructions (Addendum)
Your legs do not have a rash. . The are itching because of dry skin.  Try Eucerin skin cream ; apply after  your shower .  Avoid soapsl like Dial because thery are drying.,  Lever 2000 is fine.    Your swelling is probably due to hydralazine which you take for blood pressure.  We are limited in what else we can try  Because of your previous intoleranaces to other blood pressure medications,  So continue taking the hydralazine and add hctz every other day in the morning.  I am checking your urine and your sodium levels today.    Please return in one month for a repeat sodium and blood pressure check.   Continue wearing your compression stockings as often as you can daily

## 2011-10-29 LAB — BASIC METABOLIC PANEL
BUN: 16 mg/dL (ref 6–23)
Chloride: 101 mEq/L (ref 96–112)
Creatinine, Ser: 1 mg/dL (ref 0.4–1.5)
Glucose, Bld: 89 mg/dL (ref 70–99)
Potassium: 4 mEq/L (ref 3.5–5.1)

## 2011-10-29 LAB — MICROALBUMIN / CREATININE URINE RATIO: Creatinine,U: 129.4 mg/dL

## 2011-10-30 ENCOUNTER — Encounter: Payer: Self-pay | Admitting: Internal Medicine

## 2011-10-30 DIAGNOSIS — L309 Dermatitis, unspecified: Secondary | ICD-10-CM | POA: Insufficient documentation

## 2011-10-30 DIAGNOSIS — R600 Localized edema: Secondary | ICD-10-CM | POA: Insufficient documentation

## 2011-10-30 NOTE — Assessment & Plan Note (Signed)
His blood pressure is elevated today but her home checks he is within range for his age on current regimen. Renal function is checked today as well as electrolytes. He will return in one month for repeat the medicines we are resuming every other day hydrochlorothiazide for management of edema.

## 2011-10-30 NOTE — Assessment & Plan Note (Signed)
He has no rash on exam only excessively dry skin. Recommended he stop using the hydrocortisone cream start using a good moisturizer like Eucerin.

## 2011-10-30 NOTE — Assessment & Plan Note (Addendum)
Secondary to underlying venous insufficiency, aggravated by use of vasodilators for management of hypertension. He has multiple intolerances to alternative antihypertensives including dizziness and electrolyte deficiencies to amlodipine and furosemide as well as Maxzide. I recommended continued use of compression stockings daily and use of low-dose hydrochlorothiazide every other day.

## 2011-10-30 NOTE — Assessment & Plan Note (Signed)
His myeloproliferative disorder is essential thrombocytosis ptosis/polycythemia vera. Managed with hydroxyurea. He has been reassigned to Dr. candidate after the loss of Dr. Haze Rushing left [facility]. He has had no recent TIAs and his counts are stable. Ideally he should have CBCs every 2 months but he does not want to go to the cancer Center more than every 4 months.

## 2011-11-11 ENCOUNTER — Ambulatory Visit (INDEPENDENT_AMBULATORY_CARE_PROVIDER_SITE_OTHER): Payer: Medicare Other | Admitting: Internal Medicine

## 2011-11-11 DIAGNOSIS — Z23 Encounter for immunization: Secondary | ICD-10-CM

## 2011-11-23 ENCOUNTER — Ambulatory Visit (INDEPENDENT_AMBULATORY_CARE_PROVIDER_SITE_OTHER): Payer: Medicare Other | Admitting: Internal Medicine

## 2011-11-23 DIAGNOSIS — Z23 Encounter for immunization: Secondary | ICD-10-CM

## 2011-11-24 ENCOUNTER — Telehealth: Payer: Self-pay | Admitting: Internal Medicine

## 2011-11-24 NOTE — Telephone Encounter (Signed)
[facility] called and stated patients insurance is rejecting his BP meds because they said he should not be on both lisinopril and losartan.  Please advise on why he is on both so insurance will pay for his medications.

## 2011-11-24 NOTE — Telephone Encounter (Signed)
He is on both because he has multiple intolerances to other options, and his kidney function is stable.

## 2011-11-25 NOTE — Telephone Encounter (Signed)
Pharmacy notified.

## 2011-11-30 ENCOUNTER — Encounter: Payer: Self-pay | Admitting: Internal Medicine

## 2011-11-30 ENCOUNTER — Ambulatory Visit (INDEPENDENT_AMBULATORY_CARE_PROVIDER_SITE_OTHER): Payer: Medicare Other | Admitting: Internal Medicine

## 2011-11-30 VITALS — BP 144/67 | HR 47 | Temp 98.8°F | Ht 69.0 in | Wt 180.0 lb

## 2011-11-30 DIAGNOSIS — R609 Edema, unspecified: Secondary | ICD-10-CM

## 2011-11-30 DIAGNOSIS — R600 Localized edema: Secondary | ICD-10-CM

## 2011-11-30 DIAGNOSIS — Z79899 Other long term (current) drug therapy: Secondary | ICD-10-CM

## 2011-11-30 DIAGNOSIS — E871 Hypo-osmolality and hyponatremia: Secondary | ICD-10-CM

## 2011-11-30 DIAGNOSIS — I10 Essential (primary) hypertension: Secondary | ICD-10-CM

## 2011-11-30 LAB — BASIC METABOLIC PANEL
GFR: 72.19 mL/min (ref >60.00)
Potassium: 4 mEq/L (ref 3.5–5.1)
Sodium: 133 mEq/L — ABNORMAL LOW (ref 135–145)

## 2011-11-30 MED ORDER — NEBIVOLOL HCL 20 MG PO TABS: ORAL_TABLET | ORAL | Status: DC

## 2011-11-30 NOTE — Progress Notes (Signed)
Patient ID: Jimmy Jimenez, male   DOB: 03/13/1925, 78 y.o.   MRN: [id number] Patient Active Problem List  Diagnosis  . Essential thrombocytosis  . Hyperlipidemia  . Hypertension  . Osteoporosis  . Upper respiratory infection  . Edema of both legs  . Dermatitis  . Hyponatremia    Subjective:  CC:   Chief Complaint  Patient presents with  . Follow-up    blood pressure    HPI:   Jimmy Jimenez a 78 y.o. male who presents One month followup on hypertension and lower extremity edema. Last month but resumed every other day low-dose hydrochlorothiazide for management of lower extremity edema. This has worked well for him. He states that with the current regimen his penis is resolved. He has run out of his losartan and has not been able to refill it because his insurance is now requesting prior authorization even though he's been on for over a year. He has multiple intolerances to other blood pressure medications including amlodipine, atenolol and metoprolol.   Past Medical History  Diagnosis Date  . Lung nodule 12/2004    found on CXR  . Essential thrombocytosis   . Hyperlipidemia   . Hypertension   . Osteoporosis   . Essential thrombocytosis     Past Surgical History  Procedure Date  . Nasal sinus surgery      The following portions of the patient's history were reviewed and updated as appropriate: Allergies, current medications, and problem list.    Review of Systems:   12 Pt  review of systems was negative .     History   Social History  . Marital Status: Married    Spouse Name: N/A    Number of Children: N/A  . Years of Education: N/A   Occupational History  . Retired     former Hotel manager, Electronics engineer   Social History Main Topics  . Smoking status: Never Smoker   . Smokeless tobacco: Never Used  . Alcohol Use: No  . Drug Use: No  . Sexually Active: Not on file   Other Topics Concern  . Not on file   Social History Narrative  . No narrative on file     Objective:  BP 144/67  Pulse 47  Temp 98.8 F (37.1 C)  Ht 5\' 9"  (1.753 m)  Wt 180 lb (81.647 kg)  BMI 26.58 kg/m2  SpO2 97%  General appearance: alert, cooperative and appears stated age Ears: normal TM's and external ear canals both ears Throat: lips, mucosa, and tongue normal; teeth and gums normal Neck: no adenopathy, no carotid bruit, supple, symmetrical, trachea midline and thyroid not enlarged, symmetric, no tenderness/mass/nodules Back: symmetric, no curvature. ROM normal. No CVA tenderness. Lungs: clear to auscultation bilaterally Heart: regular rate and rhythm, S1, S2 normal, no murmur, click, rub or gallop Abdomen: soft, non-tender; bowel sounds normal; no masses,  no organomegaly Pulses: 2+ and symmetric Skin: Skin color, texture, turgor normal. No rashes or lesions Lymph nodes: Cervical, supraclavicular, and axillary nodes normal.  Assessment and Plan:  Edema of both legs imporved with every other day hctz low dose.  Aggravated by use of hydralazine. Checking BMET today  Hypertension Well-controlled on current regimen. However his losartan has now been refused by his insurance company to prior authorization proving intolerance to other medications has been done. This has been done today. Repeat be made will be done today for  Hyponatremia Recurrent but mild. Continue hydrochlorothiazide every other day.   Updated Medication List Outpatient Encounter  Prescriptions as of 11/30/2011  Medication Sig Dispense Refill  . alendronate (FOSAMAX) 70 MG tablet Take 1 tablet (70 mg total) by mouth every 7 (seven) days. Take with a full glass of water on an empty stomach.  12 tablet  1  . aspirin 81 MG tablet Take 81 mg by mouth daily.        . Calcium Citrate-Vitamin D (CITRACAL + D PO) Take 2 by mouth daily, calcium 630 and vitamin D 500 each.      . Cholecalciferol (VITAMIN D3) 1000 UNITS CAPS Take 2 by mouth daily      . hydrALAZINE (APRESOLINE) 50 MG tablet TAKE ONE  TABLET BY MOUTH THREE TIMES DAILY  270 tablet  3  . hydrochlorothiazide (HYDRODIURIL) 12.5 MG tablet Every other day  In the morning  30 tablet  1  . hydroxyurea (HYDREA) 500 MG capsule Take 500 mg by mouth daily. May take with food to minimize GI side effects.      [name] lisinopril (PRINIVIL,ZESTRIL) 40 MG tablet TAKE ONE TABLET BY MOUTH EVERY DAY  90 tablet  3  . losartan (COZAAR) 100 MG tablet TAKE ONE TABLET BY MOUTH EVERY DAY  30 tablet  5  . lovastatin (MEVACOR) 40 MG tablet TAKE ONE TABLET BY MOUTH EVERY DAY  90 tablet  2  . Nebivolol HCl (BYSTOLIC) 20 MG TABS 1 or 2 tablets daily  60 each  5  . DISCONTD: Nebivolol HCl (BYSTOLIC) 20 MG TABS Take 1 tablet (20 mg total) by mouth daily.  60 each  5  . DISCONTD: Nebivolol HCl (BYSTOLIC) 20 MG TABS 1 or 2 tablets daily  60 each  5     Orders Placed This Encounter  Procedures  . Basic metabolic panel    No Follow-up on file.

## 2011-11-30 NOTE — Assessment & Plan Note (Signed)
imporved with every other day hctz low dose.  Aggravated by use of hydralazine. Checking BMET today

## 2011-12-01 ENCOUNTER — Encounter: Payer: Self-pay | Admitting: Internal Medicine

## 2011-12-01 DIAGNOSIS — E871 Hypo-osmolality and hyponatremia: Secondary | ICD-10-CM | POA: Insufficient documentation

## 2011-12-01 NOTE — Assessment & Plan Note (Signed)
Well-controlled on current regimen. However his losartan has now been refused by his insurance company to prior authorization proving intolerance to other medications has been done. This has been done today. Repeat be made will be done today for

## 2011-12-01 NOTE — Assessment & Plan Note (Signed)
Recurrent but mild. Continue hydrochlorothiazide every other day.

## 2011-12-03 NOTE — Progress Notes (Signed)
Quick Note:  Patient Informed and voiced understanding ______ 

## 2011-12-08 ENCOUNTER — Other Ambulatory Visit: Payer: Self-pay

## 2011-12-08 MED ORDER — LOSARTAN POTASSIUM 100 MG PO TABS: 100.0000 mg | ORAL_TABLET | Freq: Every day | ORAL | Status: DC

## 2011-12-08 NOTE — Telephone Encounter (Signed)
Losartan 100 mg #30 5 R sent in to [facility] pharmacy [facility])

## 2011-12-29 ENCOUNTER — Ambulatory Visit: Payer: Self-pay | Admitting: Internal Medicine

## 2011-12-29 LAB — CBC CANCER CENTER
Basophil %: 0.7 %
Eosinophil #: 0.2 x10 3/mm (ref 0.0–0.7)
Eosinophil %: 2.5 %
HCT: 41.7 % (ref 40.0–52.0)
HGB: 13.9 g/dL (ref 13.0–18.0)
Lymphocyte #: 1.4 x10 3/mm (ref 1.0–3.6)
Lymphocyte %: 23.4 %
Neutrophil %: 62.3 %
Platelet: 247 x10 3/mm (ref 150–440)
RBC: 3.76 10*6/uL — ABNORMAL LOW (ref 4.40–5.90)

## 2011-12-29 LAB — HEPATIC FUNCTION PANEL A (ARMC)
Albumin: 4 g/dL (ref 3.4–5.0)
Alkaline Phosphatase: 66 U/L (ref 50–136)
Bilirubin, Direct: 0.2 mg/dL (ref 0.00–0.20)
Bilirubin,Total: 1 mg/dL (ref 0.2–1.0)
SGOT(AST): 19 U/L (ref 15–37)
Total Protein: 7 g/dL (ref 6.4–8.2)

## 2011-12-29 LAB — CREATININE, SERUM
Creatinine: 1.14 mg/dL (ref 0.60–1.30)
EGFR (African American): >60
EGFR (Non-African Amer.): 59 — ABNORMAL LOW

## 2011-12-31 ENCOUNTER — Ambulatory Visit: Payer: Self-pay | Admitting: Internal Medicine

## 2012-01-06 ENCOUNTER — Other Ambulatory Visit: Payer: Self-pay | Admitting: Internal Medicine

## 2012-01-07 ENCOUNTER — Other Ambulatory Visit: Payer: Self-pay

## 2012-04-18 ENCOUNTER — Ambulatory Visit: Payer: Self-pay | Admitting: Internal Medicine

## 2012-04-19 LAB — CBC CANCER CENTER
Eosinophil #: 0.2 x10 3/mm (ref 0.0–0.7)
Eosinophil %: 2.9 %
Lymphocyte #: 2 x10 3/mm (ref 1.0–3.6)
Lymphocyte %: 28.1 %
MCHC: 34.3 g/dL (ref 32.0–36.0)
MCV: 110 fL — ABNORMAL HIGH (ref 80–100)
Monocyte %: 10.4 %
Neutrophil #: 4.1 x10 3/mm (ref 1.4–6.5)
Neutrophil %: 57.5 %
RDW: 13.2 % (ref 11.5–14.5)

## 2012-04-19 LAB — HEPATIC FUNCTION PANEL A (ARMC)
Albumin: 4 g/dL (ref 3.4–5.0)
Bilirubin,Total: 0.9 mg/dL (ref 0.2–1.0)
SGOT(AST): 20 U/L (ref 15–37)
SGPT (ALT): 20 U/L (ref 12–78)

## 2012-04-19 LAB — CREATININE, SERUM
Creatinine: 1.19 mg/dL (ref 0.60–1.30)
EGFR (Non-African Amer.): 55 — ABNORMAL LOW

## 2012-04-29 ENCOUNTER — Ambulatory Visit: Payer: Self-pay | Admitting: Internal Medicine

## 2012-05-03 ENCOUNTER — Other Ambulatory Visit: Payer: Self-pay | Admitting: Internal Medicine

## 2012-05-03 NOTE — Telephone Encounter (Signed)
Ok to fill 

## 2012-07-03 ENCOUNTER — Other Ambulatory Visit: Payer: Self-pay | Admitting: Internal Medicine

## 2012-07-30 ENCOUNTER — Ambulatory Visit: Payer: Self-pay | Admitting: Internal Medicine

## 2012-08-09 LAB — CBC CANCER CENTER
Basophil #: 0 x10 3/mm (ref 0.0–0.1)
Eosinophil #: 0.1 x10 3/mm (ref 0.0–0.7)
Eosinophil %: 2.5 %
HCT: 38.2 % — ABNORMAL LOW (ref 40.0–52.0)
HGB: 13.3 g/dL (ref 13.0–18.0)
Lymphocyte %: 22.9 %
MCHC: 34.8 g/dL (ref 32.0–36.0)
Monocyte #: 0.6 x10 3/mm (ref 0.2–1.0)
Monocyte %: 10.7 %
Neutrophil #: 3.7 x10 3/mm (ref 1.4–6.5)
Platelet: 214 x10 3/mm (ref 150–440)
RBC: 3.45 10*6/uL — ABNORMAL LOW (ref 4.40–5.90)
RDW: 12.4 % (ref 11.5–14.5)

## 2012-08-09 LAB — HEPATIC FUNCTION PANEL A (ARMC)
Albumin: 3.9 g/dL (ref 3.4–5.0)
Alkaline Phosphatase: 54 U/L (ref 50–136)
Bilirubin, Direct: 0.2 mg/dL (ref 0.00–0.20)
Bilirubin,Total: 1 mg/dL (ref 0.2–1.0)

## 2012-08-09 LAB — CREATININE, SERUM: Creatinine: 1.32 mg/dL — ABNORMAL HIGH (ref 0.60–1.30)

## 2012-08-11 ENCOUNTER — Telehealth: Payer: Self-pay | Admitting: Internal Medicine

## 2012-08-11 NOTE — Telephone Encounter (Signed)
Tried to reach patient no answer and no voicemail. 

## 2012-08-11 NOTE — Telephone Encounter (Signed)
Received labs from Dr Darden Dates offcie noting there had been a change in his kidney function.  He was due for 6 month follow up in April with me. Ask him to make appt.

## 2012-08-21 NOTE — Telephone Encounter (Signed)
Patient has 6 month follow up scheduled for 08/30/12

## 2012-08-29 ENCOUNTER — Ambulatory Visit: Payer: Self-pay | Admitting: Internal Medicine

## 2012-08-29 ENCOUNTER — Ambulatory Visit: Payer: Medicare Other | Admitting: Internal Medicine

## 2012-08-30 ENCOUNTER — Encounter: Payer: Self-pay | Admitting: *Deleted

## 2012-08-30 ENCOUNTER — Ambulatory Visit (INDEPENDENT_AMBULATORY_CARE_PROVIDER_SITE_OTHER): Payer: Medicare Other | Admitting: Internal Medicine

## 2012-08-30 VITALS — BP 158/60 | HR 47 | Temp 97.8°F | Resp 14 | Wt 176.8 lb

## 2012-08-30 DIAGNOSIS — R001 Bradycardia, unspecified: Secondary | ICD-10-CM

## 2012-08-30 DIAGNOSIS — I498 Other specified cardiac arrhythmias: Secondary | ICD-10-CM

## 2012-08-30 DIAGNOSIS — Z79899 Other long term (current) drug therapy: Secondary | ICD-10-CM

## 2012-08-30 DIAGNOSIS — I10 Essential (primary) hypertension: Secondary | ICD-10-CM

## 2012-08-30 DIAGNOSIS — M7989 Other specified soft tissue disorders: Secondary | ICD-10-CM

## 2012-08-30 LAB — MICROALBUMIN / CREATININE URINE RATIO
Creatinine,U: 140 mg/dL
Microalb Creat Ratio: 0.5 mg/g (ref 0.0–30.0)

## 2012-08-30 LAB — COMPREHENSIVE METABOLIC PANEL
ALT: 13 U/L (ref 0–53)
CO2: 30 mEq/L (ref 19–32)
Calcium: 9.9 mg/dL (ref 8.4–10.5)
Chloride: 99 mEq/L (ref 96–112)
GFR: 69.73 mL/min (ref >60.00)
Potassium: 4.7 mEq/L (ref 3.5–5.1)
Sodium: 136 mEq/L (ref 135–145)
Total Protein: 7.4 g/dL (ref 6.0–8.3)

## 2012-08-30 NOTE — Patient Instructions (Addendum)
Your blood pressures are varaible but fine.,  No changes today  We are checking your  kidneys today with urine and blood tests.   We are getting an ultrasound of your right brachial artery/vein to make sure you don't have an aneursym in that area.   You can use Afrin nasal spray once or twice daily for no more than 5 days to resolve your congestion  You can use simply saline twice as long as lon gas you like to flush sinuses    Avoid "sinus" type medications (anything with a  "D"added at the end  needs to be avoided: they  contain phenylephrine,  pseuodephedrine or neosynephrine : thse are bad for blood pressure)  nitiall

## 2012-08-30 NOTE — Progress Notes (Signed)
Patient ID: Jimmy Jimenez, male   DOB: 03/13/1925, 78 y.o.   MRN: [id number]   Patient Active Problem List   Diagnosis Date Noted  . Bradycardia, sinus 09/02/2012  . Hyponatremia 12/01/2011  . Edema of both legs 10/30/2011  . Dermatitis 10/30/2011  . Upper respiratory infection 02/16/2011  . Essential thrombocytosis   . Hyperlipidemia   . Hypertension   . Osteoporosis     Subjective:  CC:   Chief Complaint  Patient presents with  . Follow-up    6 month follow up/ Patient concerned about pulse rate being low.    HPI:  Patient Active Problem List   Diagnosis Date Noted  . Bradycardia, sinus 09/02/2012  . Swelling of arm 09/02/2012  . Hyponatremia 12/01/2011  . Edema of both legs 10/30/2011  . Dermatitis 10/30/2011  . Upper respiratory infection 02/16/2011  . Essential thrombocytosis   . Hyperlipidemia   . Hypertension   . Osteoporosis     Subjective:  CC:   Chief Complaint  Patient presents with  . Follow-up    6 month follow up/ Patient concerned about pulse rate being low.    HPI:   Jimmy Jimenez a 79 y.o. male who presents Follow up on hypertension.  Last seen 9 months ago.  Multiple intolerances to medications.  Taking current medications, as instructed.  Home bps have been recorded and  Brought to visit and range from 130 systolic to 160 ,  pulse has been in the low 50s.  No syncopal events,  No complaints of fatigue or presyncope.   2) Right antecubital swelling, has been present for the past  6 to 7  Months,  Initially occurred after having a phlebotomist at the Cancer center draw blood from the brachial vein. It has not resolved.  He is not receiving any blood draws from that side anymore .  No workup was done for aneurysm or DVT.   Past Medical History  Diagnosis Date  . Lung nodule 12/2004    found on CXR  . Essential thrombocytosis   . Hyperlipidemia   . Hypertension   . Osteoporosis   . Essential thrombocytosis     Past Surgical History   Procedure Laterality Date  . Nasal sinus surgery         The following portions of the patient's history were reviewed and updated as appropriate: Allergies, current medications, and problem list.    Review of Systems:   Patient denies headache, fevers, malaise, unintentional weight loss, skin rash, eye pain, sinus congestion and sinus pain, sore throat, dysphagia,  hemoptysis , cough, dyspnea, wheezing, chest pain, palpitations, orthopnea, edema, abdominal pain, nausea, melena, diarrhea, constipation, flank pain, dysuria, hematuria, urinary  Frequency, nocturia, numbness, tingling, seizures,  Focal weakness, Loss of consciousness,  Tremor, insomnia, depression, anxiety, and suicidal ideation.     History   Social History  . Marital Status: Married    Spouse Name: N/A    Number of Children: N/A  . Years of Education: N/A   Occupational History  . Retired     former Hotel manager, Electronics engineer   Social History Main Topics  . Smoking status: Never Smoker   . Smokeless tobacco: Never Used  . Alcohol Use: No  . Drug Use: No  . Sexually Active: Not on file   Other Topics Concern  . Not on file   Social History Narrative  . No narrative on file    Objective:  BP 158/60  Pulse 47  Temp(Src) 97.8 F (  36.6 C) (Oral)  Resp 14  Wt 176 lb 12 oz (80.173 kg)  BMI 26.09 kg/m2  SpO2 96%  General appearance: alert, cooperative and appears stated age Ears: normal TM's and external ear canals both ears Throat: lips, mucosa, and tongue normal; teeth and gums normal Neck: no adenopathy, no carotid bruit, supple, symmetrical, trachea midline and thyroid not enlarged, symmetric, no tenderness/mass/nodules Back: symmetric, no curvature. ROM normal. No CVA tenderness. Lungs: clear to auscultation bilaterally Heart: regular rate and rhythm, S1, S2 normal, no murmur, click, rub or gallop Abdomen: soft, non-tender; bowel sounds normal; no masses,  no organomegaly Pulses: 2+ and symmetric Ext:  right antecubital fossa swelling without  Erythema , pulsatile Skin: Skin color, texture, turgor normal. No rashes or lesions Lymph nodes: Cervical, supraclavicular, and axillary nodes normal.  Assessment and Plan:  Bradycardia, sinus Mild, without symptoms .  He is takign bystolic as part of his antihypertensive regimen due to multiple intolerances to other medications.  No changes today unless he becomes symptomatic.  Hypertension Well-controlled on current regimen for his age.  continue cureent medications unless his bradycardia becomes symptomatic.  Prior intolerances to amlodipine, furosemide and maxzide due to excessive edema an dhypnatremia.,  Using hctz every other day . Sodium and lytes are normal.  Swelling of arm His subacute antecubital swelling may be due to aneurysm of brachial artery or a brachial vein thrombosis.  Ordering an ultrasound of the area for  further evaluation.   Osteoporosis Resolved with alternate day use of HCTZ.    Updated Medication List Outpatient Encounter Prescriptions as of 08/30/2012  Medication Sig Dispense Refill  . alendronate (FOSAMAX) 70 MG tablet Take 1 tablet (70 mg total) by mouth every 7 (seven) days. Take with a full glass of water on an empty stomach.  12 tablet  1  . alendronate (FOSAMAX) 70 MG tablet TAKE ONE TABLET BY MOUTH ONCE A WEEK IN THE MORNING TAKE WITH WATER 40 MINUTES PRIOR TO ANY FOOD AND REMAIN SITTING UP  12 tablet  0  . aspirin 81 MG tablet Take 81 mg by mouth daily.        . Calcium Citrate-Vitamin D (CITRACAL + D PO) Take 2 by mouth daily, calcium 630 and vitamin D 500 each.      . Cholecalciferol (VITAMIN D3) 1000 UNITS CAPS Take 2 by mouth daily      . hydrALAZINE (APRESOLINE) 50 MG tablet TAKE ONE TABLET BY MOUTH THREE TIMES DAILY  270 tablet  3  . hydroxyurea (HYDREA) 500 MG capsule Take 500 mg by mouth daily. May take with food to minimize GI side effects.      [name] lisinopril (PRINIVIL,ZESTRIL) 40 MG tablet TAKE ONE  TABLET BY MOUTH EVERY DAY  90 tablet  0  . lovastatin (MEVACOR) 40 MG tablet TAKE ONE TABLET BY MOUTH EVERY DAY  90 tablet  0  . Nebivolol HCl (BYSTOLIC) 20 MG TABS 1 or 2 tablets daily  60 each  5  . hydrochlorothiazide (HYDRODIURIL) 12.5 MG tablet Every other day  In the morning  30 tablet  1  . losartan (COZAAR) 100 MG tablet Take 1 tablet (100 mg total) by mouth daily.  30 tablet  5   No facility-administered encounter medications on file as of 08/30/2012.     Orders Placed This Encounter  Procedures  . Comprehensive metabolic panel  . Microalbumin / creatinine urine ratio  . Ambulatory referral to Vascular Surgery    No Follow-up on file.

## 2012-09-02 ENCOUNTER — Encounter: Payer: Self-pay | Admitting: Internal Medicine

## 2012-09-02 DIAGNOSIS — R001 Bradycardia, unspecified: Secondary | ICD-10-CM | POA: Insufficient documentation

## 2012-09-02 DIAGNOSIS — M7989 Other specified soft tissue disorders: Secondary | ICD-10-CM | POA: Insufficient documentation

## 2012-09-02 NOTE — Assessment & Plan Note (Addendum)
Mild, without symptoms .  He is takign bystolic as part of his antihypertensive regimen due to multiple intolerances to other medications.  No changes today unless he becomes symptomatic.

## 2012-09-02 NOTE — Assessment & Plan Note (Addendum)
Well-controlled on current regimen for his age.  continue cureent medications unless his bradycardia becomes symptomatic.  Prior intolerances to amlodipine, furosemide and maxzide due to excessive edema an dhypnatremia.,  Using hctz every other day . Sodium and lytes are normal.

## 2012-09-02 NOTE — Assessment & Plan Note (Signed)
Resolved with alternate day use of HCTZ.

## 2012-09-02 NOTE — Assessment & Plan Note (Signed)
His subacute antecubital swelling may be due to aneurysm of brachial artery or a brachial vein thrombosis.  Ordering an ultrasound of the area for  further evaluation.

## 2012-09-08 MED ORDER — ALENDRONATE SODIUM 70 MG PO TABS: ORAL_TABLET | ORAL | Status: DC

## 2012-09-11 ENCOUNTER — Other Ambulatory Visit: Payer: Self-pay | Admitting: Internal Medicine

## 2012-09-15 ENCOUNTER — Other Ambulatory Visit: Payer: Self-pay | Admitting: Internal Medicine

## 2012-10-02 ENCOUNTER — Other Ambulatory Visit: Payer: Self-pay | Admitting: *Deleted

## 2012-10-02 ENCOUNTER — Other Ambulatory Visit: Payer: Self-pay | Admitting: Internal Medicine

## 2012-10-02 DIAGNOSIS — E785 Hyperlipidemia, unspecified: Secondary | ICD-10-CM

## 2012-10-02 DIAGNOSIS — Z79899 Other long term (current) drug therapy: Secondary | ICD-10-CM

## 2012-10-02 MED ORDER — HYDRALAZINE HCL 50 MG PO TABS: ORAL_TABLET | ORAL | Status: DC

## 2012-10-02 NOTE — Telephone Encounter (Signed)
Received refill request for Lovastatin. Last lipid panel 5/12. Do you want me to order lipid panel, anything else?

## 2012-10-13 NOTE — Addendum Note (Signed)
Addended by: Sherlene Shams on: 10/13/2012 11:35 AM   Modules accepted: Orders

## 2012-10-16 ENCOUNTER — Ambulatory Visit (INDEPENDENT_AMBULATORY_CARE_PROVIDER_SITE_OTHER): Payer: Medicare Other | Admitting: Internal Medicine

## 2012-10-16 ENCOUNTER — Encounter: Payer: Self-pay | Admitting: Internal Medicine

## 2012-10-16 VITALS — BP 142/68 | HR 55 | Temp 98.2°F | Resp 12 | Wt 176.0 lb

## 2012-10-16 DIAGNOSIS — R351 Nocturia: Secondary | ICD-10-CM

## 2012-10-16 DIAGNOSIS — R0989 Other specified symptoms and signs involving the circulatory and respiratory systems: Secondary | ICD-10-CM

## 2012-10-16 DIAGNOSIS — I110 Hypertensive heart disease with heart failure: Secondary | ICD-10-CM | POA: Insufficient documentation

## 2012-10-16 DIAGNOSIS — R911 Solitary pulmonary nodule: Secondary | ICD-10-CM | POA: Insufficient documentation

## 2012-10-16 DIAGNOSIS — M7989 Other specified soft tissue disorders: Secondary | ICD-10-CM

## 2012-10-16 DIAGNOSIS — I10 Essential (primary) hypertension: Secondary | ICD-10-CM

## 2012-10-16 DIAGNOSIS — Z0000 Encounter for general adult medical examination without abnormal findings: Secondary | ICD-10-CM

## 2012-10-16 LAB — URINALYSIS
Hgb urine dipstick: NEGATIVE
Ketones, ur: NEGATIVE
Urine Glucose: NEGATIVE
Urobilinogen, UA: 0.2 (ref 0.0–1.0)

## 2012-10-16 MED ORDER — TETANUS-DIPHTH-ACELL PERTUSSIS 5-2.5-18.5 LF-MCG/0.5 IM SUSP: 0.5000 mL | Freq: Once | INTRAMUSCULAR | Status: DC

## 2012-10-16 MED ORDER — DOXAZOSIN MESYLATE 1 MG PO TABS: 1.0000 mg | ORAL_TABLET | Freq: Every day | ORAL | Status: DC

## 2012-10-16 NOTE — Assessment & Plan Note (Signed)
Well controlled on current regimen. Renal function stable, no changes today. 

## 2012-10-16 NOTE — Assessment & Plan Note (Signed)
No evidence of DVT by recent vascular evaluation

## 2012-10-16 NOTE — Assessment & Plan Note (Signed)
With cxr suggestive of chf.  BNP, Echo ordered

## 2012-10-16 NOTE — Progress Notes (Signed)
Patient ID: Jimmy Jimenez, male   DOB: 03/13/1925, 79 y.o.   MRN: [id number] Patient Active Problem List   Diagnosis Date Noted  . Solitary pulmonary nodule 10/16/2012  . Abnormal chest sounds 10/16/2012  . Bradycardia, sinus 09/02/2012  . Swelling of arm 09/02/2012  . Hyponatremia 12/01/2011  . Edema of both legs 10/30/2011  . Dermatitis 10/30/2011  . Upper respiratory infection 02/16/2011  . Essential thrombocytosis   . Hyperlipidemia   . Hypertension   . Osteoporosis     Subjective:  CC:   Chief Complaint  Patient presents with  . Follow-up    [facility]  follow up on X-ray    HPI:   Jimmy Jimenez a 79 y.o. male who presents Follow up on hypertension.  Last seen 9 months ago.  Multiple intolerances to medications.  Taking current medications, as instructed.  Home bps have been recorded and  Brought to visit and range from 130 systolic to 160 ,  pulse has been in the low 50s.  No syncopal events,  No complaints of fatigue or presyncope.   2) Right antecubital swelling, has been present for the past  6 to 7  Months,  Initially occurred after [facility] draw blood from the brachial vein. It has not resolved.  He is not receiving any blood draws from that side anymore .  No workup was done for aneurysm or DVT.    Past Medical History  Diagnosis Date  . Lung nodule 12/2004    found on CXR  . Essential thrombocytosis   . Hyperlipidemia   . Hypertension   . Osteoporosis   . Essential thrombocytosis     Past Surgical History  Procedure Laterality Date  . Nasal sinus surgery         The following portions of the patient's history were reviewed and updated as appropriate: Allergies, current medications, and problem list.    Review of Systems:   12 Pt  review of systems was negative except those addressed in the HPI,     History   Social History  . Marital Status: Married    Spouse Name: N/A    Number of Children: N/A  . Years of  Education: N/A   Occupational History  . Retired     former Hotel manager, Electronics engineer   Social History Main Topics  . Smoking status: Never Smoker   . Smokeless tobacco: Never Used  . Alcohol Use: No  . Drug Use: No  . Sexual Activity: Not on file   Other Topics Concern  . Not on file   Social History Narrative  . No narrative on file    Objective:  Filed Vitals:   10/16/12 0952  BP: 142/68  Pulse: 55  Temp: 98.2 F (36.8 C)  Resp: 12     General appearance: alert, cooperative and appears stated age Ears: normal TM's and external ear canals both ears Throat: lips, mucosa, and tongue normal; teeth and gums normal Neck: no adenopathy, no carotid bruit, supple, symmetrical, trachea midline and thyroid not enlarged, symmetric, no tenderness/mass/nodules Back: symmetric, no curvature. ROM normal. No CVA tenderness. Lungs: rales bilaterally at the bases Heart: regular rate and rhythm, S1, S2 normal, no murmur, click, rub or gallop Abdomen: soft, non-tender; bowel sounds normal; no masses,  no organomegaly Pulses: 2+ and symmetric Skin: Skin color, texture, turgor normal. No rashes or lesions Lymph nodes: Cervical, supraclavicular, and axillary nodes normal.  Assessment and Plan:  Abnormal chest sounds  With cxr suggestive of chf.  BNP, Echo ordered    Swelling of arm No evidence of DVT by recent vascular evaluation   Hypertension Well controlled on current regimen. Renal function stable, no changes today.  A total of 40 minutes was spent with patient more than half of which was spent in counseling, reviewing records from other prviders and coordination of care.  Updated Medication List Outpatient Encounter Prescriptions as of 10/16/2012  Medication Sig Dispense Refill  . alendronate (FOSAMAX) 70 MG tablet Take 1 tablet (70 mg total) by mouth every 7 (seven) days. Take with a full glass of water on an empty stomach.  12 tablet  1  . aspirin 81 MG tablet Take 81 mg by mouth  daily.        . Calcium Citrate-Vitamin D (CITRACAL + D PO) Take 2 by mouth daily, calcium 630 and vitamin D 500 each.      . cetirizine (ZYRTEC) 10 MG tablet Take 1 tablet by mouth daily.      . Cholecalciferol (VITAMIN D3) 1000 UNITS CAPS Take 2 by mouth daily      . doxycycline (VIBRAMYCIN) 100 MG capsule Take 1 capsule by mouth 2 (two) times daily.      . fluticasone (FLONASE) 50 MCG/ACT nasal spray Place 2 sprays into the nose daily.      . hydrALAZINE (APRESOLINE) 50 MG tablet TAKE ONE TABLET BY MOUTH THREE TIMES DAILY  270 tablet  1  . hydroxyurea (HYDREA) 500 MG capsule Take 500 mg by mouth daily. May take with food to minimize GI side effects.      [name] lisinopril (PRINIVIL,ZESTRIL) 40 MG tablet TAKE ONE TABLET BY MOUTH EVERY DAY  90 tablet  0  . lovastatin (MEVACOR) 40 MG tablet TAKE ONE TABLET BY MOUTH ONCE DAILY *NEEDS OFFICE VISIT*  30 tablet  0  . Nebivolol HCl (BYSTOLIC) 20 MG TABS 1 or 2 tablets daily  60 each  5  . doxazosin (CARDURA) 1 MG tablet Take 1 tablet (1 mg total) by mouth at bedtime.  30 tablet  3  . hydrochlorothiazide (HYDRODIURIL) 12.5 MG tablet Every other day  In the morning  30 tablet  1  . losartan (COZAAR) 100 MG tablet Take 1 tablet (100 mg total) by mouth daily.  30 tablet  5  . TDaP (BOOSTRIX) 5-2.5-18.5 LF-MCG/0.5 injection Inject 0.5 mL into the muscle once.  0.5 mL  0  . [DISCONTINUED] alendronate (FOSAMAX) 70 MG tablet TAKE ONE TABLET BY MOUTH ONCE A WEEK IN THE MORNING  12 tablet  0  . [DISCONTINUED] alendronate (FOSAMAX) 70 MG tablet TAKE ONE TABLET BY MOUTH ONCE A WEEK IN THE MORNING. TAKE WITH WATER 40 MINUTES PRIOR TO ANY FOOD AND REMAIN SITTING UP  12 tablet  4   No facility-administered encounter medications on file as of 10/16/2012.

## 2012-10-16 NOTE — Patient Instructions (Addendum)
I am [facility] an [facility] of [facility] to see if  You have heart failure   I am starting doxazosin once tablet daily for your prostate .  Take it in the evening with your lovastatin. If it makes you dizzy or swell,  Stop it and we will try an alternative medication     Take your calcium supplement with meals  Take your alendronate by itself once a week in the morning well before breakfast and stay sitting up for at least 40 minutes  All of your other medications that are taken once daily can be taken in the morning.  You need to get your TDaP vaccine at the [facility]

## 2012-10-18 ENCOUNTER — Other Ambulatory Visit: Payer: Self-pay | Admitting: Internal Medicine

## 2012-10-31 ENCOUNTER — Other Ambulatory Visit: Payer: Medicare Other

## 2012-11-10 ENCOUNTER — Other Ambulatory Visit: Payer: Self-pay | Admitting: Internal Medicine

## 2012-11-10 NOTE — Telephone Encounter (Signed)
Eprescribed.

## 2012-11-15 ENCOUNTER — Ambulatory Visit (INDEPENDENT_AMBULATORY_CARE_PROVIDER_SITE_OTHER): Payer: Medicare Other | Admitting: *Deleted

## 2012-11-15 DIAGNOSIS — Z23 Encounter for immunization: Secondary | ICD-10-CM

## 2012-11-21 ENCOUNTER — Ambulatory Visit (INDEPENDENT_AMBULATORY_CARE_PROVIDER_SITE_OTHER): Payer: Medicare Other | Admitting: *Deleted

## 2012-11-21 DIAGNOSIS — R609 Edema, unspecified: Secondary | ICD-10-CM

## 2012-11-21 DIAGNOSIS — R0989 Other specified symptoms and signs involving the circulatory and respiratory systems: Secondary | ICD-10-CM

## 2012-11-21 DIAGNOSIS — I509 Heart failure, unspecified: Secondary | ICD-10-CM

## 2012-11-23 ENCOUNTER — Other Ambulatory Visit: Payer: Self-pay | Admitting: *Deleted

## 2012-11-23 MED ORDER — NEBIVOLOL HCL 20 MG PO TABS: ORAL_TABLET | ORAL | Status: DC

## 2012-11-23 NOTE — Telephone Encounter (Signed)
Eprescribed.

## 2012-12-08 ENCOUNTER — Other Ambulatory Visit: Payer: Medicare Other

## 2012-12-12 ENCOUNTER — Ambulatory Visit (INDEPENDENT_AMBULATORY_CARE_PROVIDER_SITE_OTHER): Payer: Medicare Other | Admitting: Internal Medicine

## 2012-12-12 ENCOUNTER — Telehealth: Payer: Self-pay | Admitting: Internal Medicine

## 2012-12-12 ENCOUNTER — Encounter: Payer: Self-pay | Admitting: Internal Medicine

## 2012-12-12 VITALS — BP 184/76 | HR 63 | Temp 98.8°F | Resp 12 | Wt 178.0 lb

## 2012-12-12 DIAGNOSIS — D473 Essential (hemorrhagic) thrombocythemia: Secondary | ICD-10-CM

## 2012-12-12 DIAGNOSIS — J310 Chronic rhinitis: Secondary | ICD-10-CM

## 2012-12-12 DIAGNOSIS — Z79899 Other long term (current) drug therapy: Secondary | ICD-10-CM

## 2012-12-12 DIAGNOSIS — I10 Essential (primary) hypertension: Secondary | ICD-10-CM

## 2012-12-12 DIAGNOSIS — E871 Hypo-osmolality and hyponatremia: Secondary | ICD-10-CM

## 2012-12-12 DIAGNOSIS — E785 Hyperlipidemia, unspecified: Secondary | ICD-10-CM

## 2012-12-12 DIAGNOSIS — I272 Pulmonary hypertension, unspecified: Secondary | ICD-10-CM

## 2012-12-12 DIAGNOSIS — I110 Hypertensive heart disease with heart failure: Secondary | ICD-10-CM

## 2012-12-12 DIAGNOSIS — M7989 Other specified soft tissue disorders: Secondary | ICD-10-CM

## 2012-12-12 DIAGNOSIS — I2789 Other specified pulmonary heart diseases: Secondary | ICD-10-CM

## 2012-12-12 DIAGNOSIS — I5030 Unspecified diastolic (congestive) heart failure: Secondary | ICD-10-CM

## 2012-12-12 LAB — COMPREHENSIVE METABOLIC PANEL
AST: 21 U/L (ref 0–37)
Albumin: 4.4 g/dL (ref 3.5–5.2)
Alkaline Phosphatase: 45 U/L (ref 39–117)
BUN: 11 mg/dL (ref 6–23)
CO2: 33 mEq/L — ABNORMAL HIGH (ref 19–32)
Calcium: 10.1 mg/dL (ref 8.4–10.5)
Creatinine, Ser: 0.9 mg/dL (ref 0.4–1.5)
GFR: 82.96 mL/min (ref >60.00)
Glucose, Bld: 104 mg/dL — ABNORMAL HIGH (ref 70–99)
Potassium: 4.8 mEq/L (ref 3.5–5.1)

## 2012-12-12 MED ORDER — LOVASTATIN 40 MG PO TABS: ORAL_TABLET | ORAL | Status: DC

## 2012-12-12 NOTE — Telephone Encounter (Signed)
Pt forgot to mention at appt this morning that he is out of lovastatin, has been completely out for 2+ weeks.  [facility] [address]

## 2012-12-12 NOTE — Progress Notes (Signed)
Patient ID: Jimmy Jimenez, male   DOB: 03/13/1925, 79 y.o.   MRN: [id number]  Patient Active Problem List   Diagnosis Date Noted  . Pulmonary hypertension, mild 12/14/2012  . Rhinitis, nonallergic 12/14/2012  . Solitary pulmonary nodule 10/16/2012  . Heart failure, diastolic, due to HTN 10/16/2012  . Bradycardia, sinus 09/02/2012  . Swelling of arm 09/02/2012  . Hyponatremia 12/01/2011  . Edema of both legs 10/30/2011  . Dermatitis 10/30/2011  . Essential thrombocytosis   . Hyperlipidemia   . Hypertension   . Osteoporosis     Subjective:  CC:   Chief Complaint  Patient presents with  . Sinusitis    x 2-4 weeks    HPI:   Jimmy Jimenez a 79 y.o. male who presents for follow up on acute and chronic issues.    1)2 to 3 week history of sinus drainage.  Symptoms started with sneezing, sore throat.  No fevers or facial pain   Sore throat. A little better   "my nose is running and I m having excessive saliva. Not taking anything for it currently.  Tried zyrtec and mucinex which helped with the thick mucus. Drinking gatorade one or two daily due to recent hyponatremia.   2) Hypertension:  Home readings reviewed.  History of labile HTN  With intolerances to multiple medications.    Last 10 days  Systolics have ranged from  125 to 158 .  Recent ECHO showed normal EF, mild  concentric LVH with impaired relaxation  and elevated right sided arterial pressures indicative of pulmonary hypertension  (70 mm hb)  3) Hyponatremia:  patient was advised to liberalize salt in diet,  Has been drinking gatorade, repeat Na due     Past Medical History  Diagnosis Date  . Lung nodule 12/2004    found on CXR  . Essential thrombocytosis   . Hyperlipidemia   . Hypertension   . Osteoporosis   . Essential thrombocytosis     Past Surgical History  Procedure Laterality Date  . Nasal sinus surgery         The following portions of the patient's history were reviewed and updated as appropriate:  Allergies, current medications, and problem list.    Review of Systems:   12 Pt  review of systems was negative except those addressed in the HPI,     History   Social History  . Marital Status: Married    Spouse Name: N/A    Number of Children: N/A  . Years of Education: N/A   Occupational History  . Retired     former Hotel manager, Electronics engineer   Social History Main Topics  . Smoking status: Never Smoker   . Smokeless tobacco: Never Used  . Alcohol Use: No  . Drug Use: No  . Sexual Activity: Not on file   Other Topics Concern  . Not on file   Social History Narrative  . No narrative on file    Objective:  Filed Vitals:   12/12/12 1153  BP: 184/76  Pulse: 63  Temp: 98.8 F (37.1 C)  Resp: 12     General appearance: alert, cooperative and appears stated age Ears: normal TM's and external ear canals both ears Throat: lips, mucosa, and tongue normal; teeth and gums normal Neck: no adenopathy, no carotid bruit, supple, symmetrical, trachea midline and thyroid not enlarged, symmetric, no tenderness/mass/nodules Back: symmetric, no curvature. ROM normal. No CVA tenderness. Lungs: clear to auscultation bilaterally Heart: regular rate and rhythm, S1, S2 normal,  no murmur, click, rub or gallop Abdomen: soft, non-tender; bowel sounds normal; no masses,  no organomegaly Pulses: 2+ and symmetric Skin: Skin color, texture, turgor normal. No rashes or lesions Lymph nodes: Cervical, supraclavicular, and axillary nodes normal.  Assessment and Plan:  Heart failure, diastolic, due to HTN Mild concentric LVH with impaired relaxation.  Tolerating nebivolol, losartan, lisinopril and hydralazine .  INtolerant or metoprolol., amlodipine .  No changes today  Pulmonary hypertension, mild Asymptomatic currently.  No history of PE or COPD.  Right sided pressures noted on ECHO  Swelling of arm DVT ruled out.  Swelling resolved.   Essential thrombocytosis Managed with hydroxyurea and  periodic phlebotomy by Dr Sher[facility]lyn Hock at [facility]     Hyponatremia Secondary to use of hctz, . Currently resolved with drink gatorade daily   Rhinitis, nonallergic Trial of benadryl   Hypertension reasonably well controlled on current regime conmplicated by multiple drug intolerances .  Renal function stable, no changes today.  Hyperlipidemia Managed with lovastatin.  Liver enzymes normal.  Refill today  A total of 40 minutes was spent with patient more than half of which was spent in counseling, reviewing records from other prviders and coordination of care. Updated Medication List Outpatient Encounter Prescriptions as of 12/12/2012  Medication Sig Dispense Refill  . alendronate (FOSAMAX) 70 MG tablet Take 1 tablet (70 mg total) by mouth every 7 (seven) days. Take with a full glass of water on an empty stomach.  12 tablet  1  . aspirin 81 MG tablet Take 81 mg by mouth daily.        . Calcium Citrate-Vitamin D (CITRACAL + D PO) Take 2 by mouth daily, calcium 630 and vitamin D 500 each.      . cetirizine (ZYRTEC) 10 MG tablet Take 1 tablet by mouth daily.      . Cholecalciferol (VITAMIN D3) 1000 UNITS CAPS Take 2 by mouth daily      . doxazosin (CARDURA) 1 MG tablet Take 1 tablet (1 mg total) by mouth at bedtime.  30 tablet  3  . fluticasone (FLONASE) 50 MCG/ACT nasal spray Place 2 sprays into the nose daily.      . hydrALAZINE (APRESOLINE) 50 MG tablet TAKE ONE TABLET BY MOUTH THREE TIMES DAILY  270 tablet  1  . hydroxyurea (HYDREA) 500 MG capsule Take 500 mg by mouth daily. May take with food to minimize GI side effects.      [name] lisinopril (PRINIVIL,ZESTRIL) 40 MG tablet TAKE ONE TABLET BY MOUTH ONCE DAILY *NEEDS OFFICE VISIT*  90 tablet  1  . losartan (COZAAR) 100 MG tablet Take 1 tablet (100 mg total) by mouth daily.  30 tablet  5  . Nebivolol HCl 20 MG TABS       . [DISCONTINUED] lovastatin (MEVACOR) 40 MG tablet TAKE ONE TABLET BY MOUTH ONCE DAILY*NEEDS OFFICE VISIT*  15 tablet  0  .  [DISCONTINUED] Nebivolol HCl (BYSTOLIC) 20 MG TABS 1 or 2 tablets daily  60 each  5  . lovastatin (MEVACOR) 40 MG tablet TAKE ONE TABLET BY MOUTH ONCE DAILY  30 tablet  5  . TDaP (BOOSTRIX) 5-2.5-18.5 LF-MCG/0.5 injection Inject 0.5 mL into the muscle once.  0.5 mL  0  . [DISCONTINUED] doxycycline (VIBRAMYCIN) 100 MG capsule Take 1 capsule by mouth 2 (two) times daily.      . [DISCONTINUED] hydrochlorothiazide (HYDRODIURIL) 12.5 MG tablet Every other day  In the morning  30 tablet  1   No facility-administered encounter  medications on file as of 12/12/2012.     Orders Placed This Encounter  Procedures  . Comp Met (CMET)    No Follow-up on file.

## 2012-12-12 NOTE — Patient Instructions (Signed)
Your morning cough and sore throat  may be coming from  post nasal drip (PND).  PND can be treated with benadryl (dipenhydramine 25 mg) taken at bedtime .  You can continue daytime  Zyrtec for allergies if it helps your sneezing  Please use  Simply Saline or Neil's Sinus rinse to flush your sinuses twice daily;  This helpd prevent congestion and infection from viruses  And bacterai that you come into contact with

## 2012-12-12 NOTE — Telephone Encounter (Signed)
Rx sent to pharmacy by escript  

## 2012-12-13 ENCOUNTER — Encounter: Payer: Self-pay | Admitting: *Deleted

## 2012-12-14 DIAGNOSIS — I272 Pulmonary hypertension, unspecified: Secondary | ICD-10-CM | POA: Insufficient documentation

## 2012-12-14 DIAGNOSIS — J310 Chronic rhinitis: Secondary | ICD-10-CM | POA: Insufficient documentation

## 2012-12-14 MED ORDER — LOVASTATIN 40 MG PO TABS: ORAL_TABLET | ORAL | Status: DC

## 2012-12-14 NOTE — Assessment & Plan Note (Addendum)
Secondary to use of hctz, . Currently resolved with drink gatorade daily

## 2012-12-14 NOTE — Assessment & Plan Note (Signed)
Managed with lovastatin.  Liver enzymes normal.  Refill today

## 2012-12-14 NOTE — Assessment & Plan Note (Signed)
DVT ruled out.  Swelling resolved.

## 2012-12-14 NOTE — Assessment & Plan Note (Signed)
Trial of benadryl

## 2012-12-14 NOTE — Assessment & Plan Note (Addendum)
Managed with hydroxyurea and periodic phlebotomy by Dr Sherrlyn Hock at [facility]

## 2012-12-14 NOTE — Assessment & Plan Note (Signed)
Asymptomatic currently.  No history of PE or COPD.  Right sided pressures noted on ECHO

## 2012-12-14 NOTE — Assessment & Plan Note (Addendum)
Mild concentric LVH with impaired relaxation.  Tolerating nebivolol, losartan, lisinopril and hydralazine .  INtolerant or metoprolol., amlodipine .  No changes today

## 2012-12-14 NOTE — Assessment & Plan Note (Signed)
reasonably well controlled on current regime conmplicated by multiple drug intolerances .  Renal function stable, no changes today.

## 2013-01-19 ENCOUNTER — Ambulatory Visit: Payer: Self-pay | Admitting: Internal Medicine

## 2013-01-19 LAB — CBC CANCER CENTER
Basophil #: 0.1 x10 3/mm (ref 0.0–0.1)
Basophil %: 1.2 %
Eosinophil #: 0.2 x10 3/mm (ref 0.0–0.7)
Eosinophil %: 3.1 %
HCT: 40.3 % (ref 40.0–52.0)
Lymphocyte #: 1.3 x10 3/mm (ref 1.0–3.6)
Lymphocyte %: 21.6 %
MCHC: 33.9 g/dL (ref 32.0–36.0)
MCV: 108 fL — ABNORMAL HIGH (ref 80–100)
Neutrophil #: 3.9 x10 3/mm (ref 1.4–6.5)
Neutrophil %: 63.7 %
Platelet: 255 x10 3/mm (ref 150–440)
RBC: 3.72 10*6/uL — ABNORMAL LOW (ref 4.40–5.90)
RDW: 11.4 % — ABNORMAL LOW (ref 11.5–14.5)

## 2013-01-19 LAB — HEPATIC FUNCTION PANEL A (ARMC): Alkaline Phosphatase: 58 U/L (ref 50–136)

## 2013-01-19 LAB — CREATININE, SERUM: Creatinine: 1.15 mg/dL (ref 0.60–1.30)

## 2013-01-29 ENCOUNTER — Ambulatory Visit: Payer: Self-pay | Admitting: Internal Medicine

## 2013-02-05 ENCOUNTER — Telehealth: Payer: Self-pay | Admitting: *Deleted

## 2013-02-05 NOTE — Telephone Encounter (Signed)
Refill Request  Bystolic 20mg  tab  #60   Take one or two tablets by mouth once daily

## 2013-04-09 ENCOUNTER — Other Ambulatory Visit: Payer: Self-pay | Admitting: Internal Medicine

## 2013-05-10 ENCOUNTER — Ambulatory Visit: Payer: Self-pay | Admitting: Internal Medicine

## 2013-05-11 LAB — HEPATIC FUNCTION PANEL A (ARMC)
AST: 19 U/L (ref 15–37)
Albumin: 4.1 g/dL (ref 3.4–5.0)
Alkaline Phosphatase: 55 U/L
BILIRUBIN DIRECT: 0.2 mg/dL (ref 0.00–0.20)
Bilirubin,Total: 0.9 mg/dL (ref 0.2–1.0)
SGPT (ALT): 14 U/L (ref 12–78)
TOTAL PROTEIN: 7.1 g/dL (ref 6.4–8.2)

## 2013-05-11 LAB — CBC CANCER CENTER
BASOS ABS: 0.1 x10 3/mm (ref 0.0–0.1)
Basophil %: 0.9 %
EOS PCT: 1.5 %
Eosinophil #: 0.1 x10 3/mm (ref 0.0–0.7)
HCT: 41.1 % (ref 40.0–52.0)
HGB: 13.8 g/dL (ref 13.0–18.0)
Lymphocyte #: 1.6 x10 3/mm (ref 1.0–3.6)
Lymphocyte %: 23.4 %
MCH: 35.9 pg — ABNORMAL HIGH (ref 26.0–34.0)
MCHC: 33.5 g/dL (ref 32.0–36.0)
MCV: 107 fL — ABNORMAL HIGH (ref 80–100)
Monocyte #: 0.7 x10 3/mm (ref 0.2–1.0)
Monocyte %: 10 %
Neutrophil #: 4.3 x10 3/mm (ref 1.4–6.5)
Neutrophil %: 64.2 %
Platelet: 271 x10 3/mm (ref 150–440)
RBC: 3.84 10*6/uL — ABNORMAL LOW (ref 4.40–5.90)
RDW: 15.7 % — AB (ref 11.5–14.5)
WBC: 6.7 x10 3/mm (ref 3.8–10.6)

## 2013-05-11 LAB — CREATININE, SERUM
CREATININE: 1.23 mg/dL (ref 0.60–1.30)
EGFR (African American): >60
GFR CALC NON AF AMER: 53 — AB

## 2013-05-30 ENCOUNTER — Ambulatory Visit: Payer: Self-pay | Admitting: Internal Medicine

## 2013-08-21 ENCOUNTER — Telehealth: Payer: Self-pay | Admitting: *Deleted

## 2013-08-21 NOTE — Telephone Encounter (Signed)
Faxed refill request from pharmacy requesting Hydrea 500mg  #30. Last OV 10.14.14.  Does not appear med has been Rxd by you before. Please advise refill

## 2013-08-21 NOTE — Telephone Encounter (Signed)
Correct,  It needs to be filled by his hematologist

## 2013-08-21 NOTE — Telephone Encounter (Signed)
See unrouted note re refill on hydrea,

## 2013-09-03 ENCOUNTER — Ambulatory Visit: Payer: Self-pay | Admitting: Internal Medicine

## 2013-09-03 LAB — CREATININE, SERUM
Creatinine: 1.29 mg/dL (ref 0.60–1.30)
GFR CALC AF AMER: 58 — AB
GFR CALC NON AF AMER: 50 — AB

## 2013-09-03 LAB — CBC CANCER CENTER
Basophil #: 0.1 x10 3/mm (ref 0.0–0.1)
Basophil %: 0.8 %
Eosinophil #: 0.1 x10 3/mm (ref 0.0–0.7)
Eosinophil %: 1.8 %
HCT: 42.3 % (ref 40.0–52.0)
HGB: 14.3 g/dL (ref 13.0–18.0)
LYMPHS PCT: 20.8 %
Lymphocyte #: 1.5 x10 3/mm (ref 1.0–3.6)
MCH: 38.5 pg — ABNORMAL HIGH (ref 26.0–34.0)
MCHC: 33.9 g/dL (ref 32.0–36.0)
MCV: 114 fL — AB (ref 80–100)
MONO ABS: 0.6 x10 3/mm (ref 0.2–1.0)
Monocyte %: 7.9 %
Neutrophil #: 4.9 x10 3/mm (ref 1.4–6.5)
Neutrophil %: 68.7 %
PLATELETS: 270 x10 3/mm (ref 150–440)
RBC: 3.72 10*6/uL — ABNORMAL LOW (ref 4.40–5.90)
RDW: 12.9 % (ref 11.5–14.5)
WBC: 7.1 x10 3/mm (ref 3.8–10.6)

## 2013-09-03 LAB — HEPATIC FUNCTION PANEL A (ARMC)
ALBUMIN: 4 g/dL (ref 3.4–5.0)
ALK PHOS: 56 U/L
BILIRUBIN TOTAL: 0.9 mg/dL (ref 0.2–1.0)
Bilirubin, Direct: 0.2 mg/dL (ref 0.00–0.20)
SGOT(AST): 25 U/L (ref 15–37)
SGPT (ALT): 16 U/L (ref 12–78)
Total Protein: 7.2 g/dL (ref 6.4–8.2)

## 2013-09-26 ENCOUNTER — Ambulatory Visit (INDEPENDENT_AMBULATORY_CARE_PROVIDER_SITE_OTHER): Payer: Medicare Other | Admitting: Internal Medicine

## 2013-09-26 ENCOUNTER — Encounter: Payer: Self-pay | Admitting: Internal Medicine

## 2013-09-26 ENCOUNTER — Encounter (INDEPENDENT_AMBULATORY_CARE_PROVIDER_SITE_OTHER): Payer: Self-pay

## 2013-09-26 VITALS — BP 160/66 | HR 74 | Temp 98.2°F | Resp 14 | Ht 69.0 in | Wt 161.8 lb

## 2013-09-26 DIAGNOSIS — R5383 Other fatigue: Secondary | ICD-10-CM

## 2013-09-26 DIAGNOSIS — Z0000 Encounter for general adult medical examination without abnormal findings: Secondary | ICD-10-CM

## 2013-09-26 DIAGNOSIS — M810 Age-related osteoporosis without current pathological fracture: Secondary | ICD-10-CM

## 2013-09-26 DIAGNOSIS — Z789 Other specified health status: Secondary | ICD-10-CM

## 2013-09-26 DIAGNOSIS — J310 Chronic rhinitis: Secondary | ICD-10-CM

## 2013-09-26 DIAGNOSIS — Z23 Encounter for immunization: Secondary | ICD-10-CM

## 2013-09-26 DIAGNOSIS — I10 Essential (primary) hypertension: Secondary | ICD-10-CM

## 2013-09-26 DIAGNOSIS — R609 Edema, unspecified: Secondary | ICD-10-CM

## 2013-09-26 DIAGNOSIS — Z1159 Encounter for screening for other viral diseases: Secondary | ICD-10-CM

## 2013-09-26 DIAGNOSIS — Z888 Allergy status to other drugs, medicaments and biological substances status: Secondary | ICD-10-CM

## 2013-09-26 DIAGNOSIS — E871 Hypo-osmolality and hyponatremia: Secondary | ICD-10-CM

## 2013-09-26 DIAGNOSIS — R5381 Other malaise: Secondary | ICD-10-CM

## 2013-09-26 DIAGNOSIS — R600 Localized edema: Secondary | ICD-10-CM

## 2013-09-26 DIAGNOSIS — Z135 Encounter for screening for eye and ear disorders: Secondary | ICD-10-CM

## 2013-09-26 DIAGNOSIS — E785 Hyperlipidemia, unspecified: Secondary | ICD-10-CM

## 2013-09-26 LAB — COMPREHENSIVE METABOLIC PANEL
ALBUMIN: 4.3 g/dL (ref 3.5–5.2)
ALT: 12 U/L (ref 0–53)
AST: 23 U/L (ref 0–37)
Alkaline Phosphatase: 54 U/L (ref 39–117)
BUN: 17 mg/dL (ref 6–23)
CALCIUM: 9.4 mg/dL (ref 8.4–10.5)
CO2: 30 meq/L (ref 19–32)
Chloride: 99 mEq/L (ref 96–112)
Creatinine, Ser: 1 mg/dL (ref 0.4–1.5)
GFR: 76.08 mL/min (ref >60.00)
GLUCOSE: 124 mg/dL — AB (ref 70–99)
POTASSIUM: 4.7 meq/L (ref 3.5–5.1)
Sodium: 135 mEq/L (ref 135–145)
Total Bilirubin: 0.9 mg/dL (ref 0.2–1.2)
Total Protein: 6.8 g/dL (ref 6.0–8.3)

## 2013-09-26 LAB — LDL CHOLESTEROL, DIRECT: Direct LDL: 103.7 mg/dL

## 2013-09-26 LAB — VITAMIN D 25 HYDROXY (VIT D DEFICIENCY, FRACTURES): VITD: 64.04 ng/mL (ref 30.00–100.00)

## 2013-09-26 LAB — TSH: TSH: 1.49 u[IU]/mL (ref 0.35–4.50)

## 2013-09-26 MED ORDER — LISINOPRIL 40 MG PO TABS: ORAL_TABLET | ORAL | Status: DC

## 2013-09-26 MED ORDER — AMLODIPINE BESYLATE 5 MG PO TABS: 5.0000 mg | ORAL_TABLET | Freq: Every day | ORAL | Status: DC

## 2013-09-26 MED ORDER — HYDRALAZINE HCL 50 MG PO TABS: ORAL_TABLET | ORAL | Status: DC

## 2013-09-26 NOTE — Patient Instructions (Signed)
You had your annual Medicare wellness exam today   For your sinus congestion and drainage  1) Please start using Simply Saline or Neil's sinus rinse twice daily to flush both sinuses (Do over the sink ) 2) follow with Afrin nasal spray twice daily for congestion 3) If no improvement in your sinuses in 2 weeks , let me know  Your urinary frequency may be due to eating canteloupe and watermelon with dinner  Please eat  Your fruit earlier in the day  Your elevations in blood pressure can be managed with  A dose of amlodipine taken ONLY FOR bp > 160  You are overdue for eye exam.  We will schedule for you   Health Maintenance A healthy lifestyle and preventative care can promote health and wellness.  Maintain regular health, dental, and eye exams.  Eat a healthy diet. Foods like vegetables, fruits, whole grains, low-fat dairy products, and lean protein foods contain the nutrients you need and are low in calories. Decrease your intake of foods high in solid fats, added sugars, and salt. Get information about a proper diet from your health care provider, if necessary.  Regular physical exercise is one of the most important things you can do for your health. Most adults should get at least 150 minutes of moderate-intensity exercise (any activity that increases your heart rate and causes you to sweat) each week. In addition, most adults need muscle-strengthening exercises on 2 or more days a week.   Maintain a healthy weight. The body mass index (BMI) is a screening tool to identify possible weight problems. It provides an estimate of body fat based on height and weight. Your health care provider can find your BMI and can help you achieve or maintain a healthy weight. For males 20 years and older:  A BMI below 18.5 is considered underweight.  A BMI of 18.5 to 24.9 is normal.  A BMI of 25 to 29.9 is considered overweight.  A BMI of 30 and above is considered obese.  Maintain normal blood  lipids and cholesterol by exercising and minimizing your intake of saturated fat. Eat a balanced diet with plenty of fruits and vegetables. Blood tests for lipids and cholesterol should begin at age 41 and be repeated every 5 years. If your lipid or cholesterol levels are high, you are over age 44, or you are at high risk for heart disease, you may need your cholesterol levels checked more frequently.Ongoing high lipid and cholesterol levels should be treated with medicines if diet and exercise are not working.  If you smoke, find out from your health care provider how to quit. If you do not use tobacco, do not start.  Lung cancer screening is recommended for adults aged 59-80 years who are at high risk for developing lung cancer because of a history of smoking. A yearly low-dose CT scan of the lungs is recommended for people who have at least a 30-pack-year history of smoking and are current smokers or have quit within the past 15 years. A pack year of smoking is smoking an average of 1 pack of cigarettes a day for 1 year (for example, a 30-pack-year history of smoking could mean smoking 1 pack a day for 30 years or 2 packs a day for 15 years). Yearly screening should continue until the smoker has stopped smoking for at least 15 years. Yearly screening should be stopped for people who develop a health problem that would prevent them from having lung cancer treatment.  If you choose to drink alcohol, do not have more than 2 drinks per day. One drink is considered to be 12 oz (360 mL) of beer, 5 oz (150 mL) of wine, or 1.5 oz (45 mL) of liquor.  Avoid the use of street drugs. Do not share needles with anyone. Ask for help if you need support or instructions about stopping the use of drugs.  High blood pressure causes heart disease and increases the risk of stroke. Blood pressure should be checked at least every 1-2 years. Ongoing high blood pressure should be treated with medicines if weight loss and  exercise are not effective.  If you are 61-12 years old, ask your health care provider if you should take aspirin to prevent heart disease.  Diabetes screening involves taking a blood sample to check your fasting blood sugar level. This should be done once every 3 years after age 19 if you are at a normal weight and without risk factors for diabetes. Testing should be considered at a younger age or be carried out more frequently if you are overweight and have at least 1 risk factor for diabetes.  Colorectal cancer can be detected and often prevented. Most routine colorectal cancer screening begins at the age of 65 and continues through age 32. However, your health care provider may recommend screening at an earlier age if you have risk factors for colon cancer. On a yearly basis, your health care provider may provide home test kits to check for hidden blood in the stool. A small camera at the end of a tube may be used to directly examine the colon (sigmoidoscopy or colonoscopy) to detect the earliest forms of colorectal cancer. Talk to your health care provider about this at age 7 when routine screening begins. A direct exam of the colon should be repeated every 5-10 years through age 64, unless early forms of precancerous polyps or small growths are found.  People who are at an increased risk for hepatitis B should be screened for this virus. You are considered at high risk for hepatitis B if:  You were born in a country where hepatitis B occurs often. Talk with your health care provider about which countries are considered high risk.  Your parents were born in a high-risk country and you have not received a shot to protect against hepatitis B (hepatitis B vaccine).  You have HIV or AIDS.  You use needles to inject street drugs.  You live with, or have sex with, someone who has hepatitis B.  You are a man who has sex with other men (MSM).  You get hemodialysis treatment.  You take certain  medicines for conditions like cancer, organ transplantation, and autoimmune conditions.  Hepatitis C blood testing is recommended for all people born from [birth year] through 1963 and any individual with known risk factors for hepatitis C.  Healthy men should no longer receive prostate-specific antigen (PSA) blood tests as part of routine cancer screening. Talk to your health care provider about prostate cancer screening.  Testicular cancer screening is not recommended for adolescents or adult males who have no symptoms. Screening includes self-exam, a health care provider exam, and other screening tests. Consult with your health care provider about any symptoms you have or any concerns you have about testicular cancer.  Practice safe sex. Use condoms and avoid high-risk sexual practices to reduce the spread of sexually transmitted infections (STIs).  You should be screened for STIs, including gonorrhea and chlamydia if:  You  are sexually active and are younger than 24 years.  You are older than 24 years, and your health care provider tells you that you are at risk for this type of infection.  Your sexual activity has changed since you were last screened, and you are at an increased risk for chlamydia or gonorrhea. Ask your health care provider if you are at risk.  If you are at risk of being infected with HIV, it is recommended that you take a prescription medicine daily to prevent HIV infection. This is called pre-exposure prophylaxis (PrEP). You are considered at risk if:  You are a man who has sex with other men (MSM).  You are a heterosexual man who is sexually active with multiple partners.  You take drugs by injection.  You are sexually active with a partner who has HIV.  Talk with your health care provider about whether you are at high risk of being infected with HIV. If you choose to begin PrEP, you should first be tested for HIV. You should then be tested every 3 months for as long as  you are taking PrEP.  Use sunscreen. Apply sunscreen liberally and repeatedly throughout the day. You should seek shade when your shadow is shorter than you. Protect yourself by wearing long sleeves, pants, a wide-brimmed hat, and sunglasses year round whenever you are outdoors.  Tell your health care provider of new moles or changes in moles, especially if there is a change in shape or color. Also, tell your health care provider if a mole is larger than the size of a pencil eraser.  A one-time screening for abdominal aortic aneurysm (AAA) and surgical repair of large AAAs by ultrasound is recommended for men aged 52-75 years who are current or former smokers.  Stay current with your vaccines (immunizations). Document Released: 08/14/2007 Document Revised: 02/20/2013 Document Reviewed: 07/13/2010 [facility] Patient Information 2015 [place], [place]. This information is not intended to replace advice given to you by your health care provider. Make sure you discuss any questions you have with your health care provider.

## 2013-09-26 NOTE — Assessment & Plan Note (Addendum)
Home bp measurements have ranged from s 127 to 147 on current regimen of hydralazine, nebivolol  and lisinopril . Prn use of amlodipine for elvations,  Not daily,  discussed  Lab Results  Component Value Date   MICROALBUR 0.7 08/30/2012   Lab Results  Component Value Date   CREATININE 1.0 09/26/2013   Lab Results  Component Value Date   NA 135 09/26/2013   K 4.7 09/26/2013   CL 99 09/26/2013   CO2 30 09/26/2013

## 2013-09-26 NOTE — Assessment & Plan Note (Signed)
Stopped statin a month ago due ot muscle weakness which has now improved.

## 2013-09-26 NOTE — Progress Notes (Signed)
Patient ID: Jimmy Jimenez, male   DOB: 03/13/1925, 80 y.o.   MRN: [id number] The patient is here for annual Medicare wellness examination and management of other chronic and acute problems.  1) Has been having bloody sinus drainage  In the morning whe he clears sinuses/  He rpoerts persistent congestion without sinus pain or fevers.  He is not using any otc antihistamines or sinus sprays    2) Nocturia x 4 for the past 3 months .  Urinates sonly once or twice from morning to 6 pm,  Drinks iced tea with dinner.   3) Elevations in blood pressure , transient.  Taking medications as directed.    The risk factors are reflected in the social history.  The roster of all physicians providing medical care to patient - is listed in the Snapshot section of the chart.  Activities of daily living:  The patient is 100% independent in all ADLs: dressing, toileting, feeding as well as independent mobility  Home safety : The patient has smoke detectors in the home. They wear seatbelts.  There are no firearms at home. There is no violence in the home.   There is no risks for hepatitis, STDs or HIV. There is no   history of blood transfusion. They have no travel history to infectious disease endemic areas of the world.  The patient has seen their dentist in the last six month. They have seen their eye doctor in the last year. They admit to slight hearing difficulty with regard to whispered voices and some television programs.  They have deferred audiologic testing in the last year.  They do not  have excessive sun exposure. Discussed the need for sun protection: hats, long sleeves and use of sunscreen if there is significant sun exposure.   Diet: the importance of a healthy diet is discussed. They do have a healthy diet.  The benefits of regular aerobic exercise were discussed. She walks 4 times per week ,  20 minutes.   Depression screen: there are no signs or vegative symptoms of depression- irritability,  change in appetite, anhedonia, sadness/tearfullness.  Cognitive assessment: the patient manages all their financial and personal affairs and is actively engaged. They could relate day,date,year and events; recalled 2/3 objects at 3 minutes; performed clock-face test normally.  The following portions of the patient's history were reviewed and updated as appropriate: allergies, current medications, past family history, past medical history,  past surgical history, past social history  and problem list.  Visual acuity was not assessed per patient preference since she has regular follow up with her ophthalmologist. Hearing and body mass index were assessed and reviewed.   During the course of the visit the patient was educated and counseled about appropriate screening and preventive services including : fall prevention , diabetes screening, nutrition counseling, colorectal cancer screening, and recommended immunizations.    Objective:    Hypertension Home bp measurements have ranged from s 127 to 147 on current regimen of hydralazine, nebivolol  and lisinopril . Prn use of amlodipine for elvations,  Not daily,  discussed  Lab Results  Component Value Date   MICROALBUR 0.7 08/30/2012   Lab Results  Component Value Date   CREATININE 1.0 09/26/2013   Lab Results  Component Value Date   NA 135 09/26/2013   K 4.7 09/26/2013   CL 99 09/26/2013   CO2 30 09/26/2013     Hyperlipidemia Stopped statin a month ago due to muscle weakness which has now improved.  Rhinitis, nonallergic With congestion,  blood streaked sinus drainage morning only, without headaches, severs or sinus pain .Not using a steroid nasal spray. Adding saline flushes and afrin prn   Edema of both legs Improved, Medication induced.  Avoiding daily use of amlodipine ,  May be causing his nocturia.   Hyponatremia Secondary to use of hctz, . Currently resolved with drink gatorade daily   Lab Results  Component Value Date    NA 135 09/26/2013   K 4.7 09/26/2013   CL 99 09/26/2013   CO2 30 09/26/2013     Medicare annual wellness visit, subsequent Annual wellness visit was done.  Age apprpriate Health maintenance screenings were discussed and handout given.    Updated Medication List Outpatient Encounter Prescriptions as of 09/26/2013  Medication Sig  . alendronate (FOSAMAX) 70 MG tablet Take 1 tablet (70 mg total) by mouth every 7 (seven) days. Take with a full glass of water on an empty stomach.  [name] aspirin 81 MG tablet Take 81 mg by mouth daily.    . Calcium Citrate-Vitamin D (CITRACAL + D PO) Take 2 by mouth daily, calcium 630 and vitamin D 500 each.  . Cholecalciferol (VITAMIN D3) 1000 UNITS CAPS Take 2 by mouth daily  . hydrALAZINE (APRESOLINE) 50 MG tablet TAKE ONE TABLET BY MOUTH THREE TIMES DAILY  . hydroxyurea (HYDREA) 500 MG capsule Take 500 mg by mouth daily. May take with food to minimize GI side effects.  [name] lisinopril (PRINIVIL,ZESTRIL) 40 MG tablet TAKE ONE TABLET BY MOUTH ONCE DAILY  . Nebivolol HCl 20 MG TABS   . TDaP (BOOSTRIX) 5-2.5-18.5 LF-MCG/0.5 injection Inject 0.5 mL into the muscle once.  . [DISCONTINUED] hydrALAZINE (APRESOLINE) 50 MG tablet TAKE ONE TABLET BY MOUTH THREE TIMES DAILY  . [DISCONTINUED] lisinopril (PRINIVIL,ZESTRIL) 40 MG tablet TAKE ONE TABLET BY MOUTH ONCE DAILY  . amLODipine (NORVASC) 5 MG tablet Take 1 tablet (5 mg total) by mouth daily. As needed for BP > 160  . cetirizine (ZYRTEC) 10 MG tablet Take 1 tablet by mouth daily.  . [DISCONTINUED] doxazosin (CARDURA) 1 MG tablet Take 1 tablet (1 mg total) by mouth at bedtime.  . [DISCONTINUED] fluticasone (FLONASE) 50 MCG/ACT nasal spray Place 2 sprays into the nose daily.  . [DISCONTINUED] losartan (COZAAR) 100 MG tablet Take 1 tablet (100 mg total) by mouth daily.  . [DISCONTINUED] lovastatin (MEVACOR) 40 MG tablet TAKE ONE TABLET BY MOUTH ONCE DAILY

## 2013-09-26 NOTE — Progress Notes (Signed)
Pre-visit discussion using our clinic review tool. No additional management support is needed unless otherwise documented below in the visit note.  

## 2013-09-27 LAB — HEPATITIS C ANTIBODY: HCV Ab: NEGATIVE

## 2013-09-28 ENCOUNTER — Encounter: Payer: Self-pay | Admitting: *Deleted

## 2013-09-28 DIAGNOSIS — Z0000 Encounter for general adult medical examination without abnormal findings: Secondary | ICD-10-CM | POA: Insufficient documentation

## 2013-09-28 NOTE — Assessment & Plan Note (Signed)
Secondary to use of hctz, . Currently resolved with drink gatorade daily   Lab Results  Component Value Date   NA 135 09/26/2013   K 4.7 09/26/2013   CL 99 09/26/2013   CO2 30 09/26/2013

## 2013-09-28 NOTE — Assessment & Plan Note (Signed)
With congestion,  blood streaked sinus drainage morning only, without headaches, severs or sinus pain .Not using a steroid nasal spray. Adding saline flushes and afrin prn

## 2013-09-28 NOTE — Assessment & Plan Note (Signed)
Annual wellness visit was done.  Age apprpriate Health maintenance screenings were discussed and handout given.

## 2013-09-28 NOTE — Assessment & Plan Note (Signed)
Medication induced.  Avi=oiding daily use of amlodipine ,  May be causing his nocturia.

## 2013-09-29 ENCOUNTER — Ambulatory Visit: Payer: Self-pay | Admitting: Internal Medicine

## 2013-12-03 ENCOUNTER — Other Ambulatory Visit: Payer: Self-pay | Admitting: Internal Medicine

## 2013-12-17 ENCOUNTER — Ambulatory Visit (INDEPENDENT_AMBULATORY_CARE_PROVIDER_SITE_OTHER): Payer: Medicare Other | Admitting: Internal Medicine

## 2013-12-17 ENCOUNTER — Encounter: Payer: Self-pay | Admitting: Internal Medicine

## 2013-12-17 VITALS — BP 168/64 | HR 86 | Temp 98.3°F | Resp 14 | Ht 69.0 in | Wt 161.8 lb

## 2013-12-17 DIAGNOSIS — L853 Xerosis cutis: Secondary | ICD-10-CM

## 2013-12-17 LAB — HEPATIC FUNCTION PANEL
ALT: 19 U/L (ref 10–40)
AST: 23 U/L (ref 14–40)
Alkaline Phosphatase: 57 U/L (ref 25–125)
Bilirubin, Direct: 0.2 mg/dL (ref 0.01–0.4)
Bilirubin, Total: 0.9 mg/dL

## 2013-12-17 LAB — CBC AND DIFFERENTIAL
HCT: 41 % (ref 41–53)
HEMOGLOBIN: 13.7 g/dL (ref 13.5–17.5)
Neutrophils Absolute: 68 /uL
PLATELETS: 286 10*3/uL (ref 150–399)
WBC: 6.1 10^3/mL

## 2013-12-17 LAB — BASIC METABOLIC PANEL: Creatinine: 1.2 mg/dL (ref 0.6–1.3)

## 2013-12-17 NOTE — Progress Notes (Signed)
Patient ID: Jimmy Jimenez, male   DOB: 03/13/1925, 80 y.o.   MRN: [id number]   Patient Active Problem List   Diagnosis Date Noted  . Xerosis of skin 12/18/2013  . Medicare annual wellness visit, subsequent 09/28/2013  . Statin intolerance 09/26/2013  . Pulmonary hypertension, mild 12/14/2012  . Rhinitis, nonallergic 12/14/2012  . Solitary pulmonary nodule 10/16/2012  . Heart failure, diastolic, due to HTN 40/98/1191  . Bradycardia, sinus 09/02/2012  . Hyponatremia 12/01/2011  . Edema of both legs 10/30/2011  . Essential thrombocytosis   . Hyperlipidemia   . Hypertension   . Osteoporosis, unspecified     Subjective:  CC:   Chief Complaint  Patient presents with  . Rash    Bilateral legs and arms small red bumps and then skin cracks, legs itching.    HPI:   Jimmy Jimenez is a 80 y.o. male who presents for  Dry cracking skin,  Skin has been cracking open and getting red and itchy for several weeks Uses Dial soap.  Drinks 40 ounces of noncaffeinated beverages daily plus coffee,  Heat now turned on in home and no humidifier,  No history of eczema or psoriasis. Takes zyrtec for allergic rnihitis, chronic,     Past Medical History  Diagnosis Date  . Lung nodule 12/2004    found on CXR  . Essential thrombocytosis   . Hyperlipidemia   . Hypertension   . Osteoporosis   . Essential thrombocytosis     Past Surgical History  Procedure Laterality Date  . Nasal sinus surgery         The following portions of the patient's history were reviewed and updated as appropriate: Allergies, current medications, and problem list.    Review of Systems:   Patient denies headache, fevers, malaise, unintentional weight loss, skin rash, eye pain, sinus congestion and sinus pain, sore throat, dysphagia,  hemoptysis , cough, dyspnea, wheezing, chest pain, palpitations, orthopnea, edema, abdominal pain, nausea, melena, diarrhea, constipation, flank pain, dysuria, hematuria, urinary   Frequency, nocturia, numbness, tingling, seizures,  Focal weakness, Loss of consciousness,  Tremor, insomnia, depression, anxiety, and suicidal ideation.     History   Social History  . Marital Status: Married    Spouse Name: N/A    Number of Children: N/A  . Years of Education: N/A   Occupational History  . Retired     former Nature conservation officer, Corporate treasurer   Social History Main Topics  . Smoking status: Never Smoker   . Smokeless tobacco: Never Used  . Alcohol Use: No  . Drug Use: No  . Sexual Activity: Not on file   Other Topics Concern  . Not on file   Social History Narrative  . No narrative on file    Objective:  Filed Vitals:   12/17/13 1626  BP: 168/64  Pulse: 86  Temp: 98.3 F (36.8 C)  Resp: 14     General appearance: alert, cooperative and appears stated age Skin: Skin color pale.  Texture extremelt dry , cracking,  No rashes or lesions Lymph nodes: Cervical, supraclavicular, and axillary nodes normal.  Assessment and Plan:  Xerosis of skin Thyroid and renal function were normal in July and reviewed today, Recommended change in soap to Aveeno and use of aveeno liquid sopa and aveeno colloidal oatmeal moisturizer post cleansing,  Stop zyrtec,  yse nasal steroids,  Increase water intake and add humidifier to house.   Derm referral if no improvement in 2-4 weeks    Updated Medication List Outpatient  Encounter Prescriptions as of 12/17/2013  Medication Sig  . alendronate (FOSAMAX) 70 MG tablet Take 1 tablet (70 mg total) by mouth every 7 (seven) days. Take with a full glass of water on an empty stomach.  [name] amLODipine (NORVASC) 5 MG tablet Take 1 tablet (5 mg total) by mouth daily. As needed for BP > 160  . aspirin 81 MG tablet Take 81 mg by mouth daily.    . Calcium Citrate-Vitamin D (CITRACAL + D PO) Take 2 by mouth daily, calcium 630 and vitamin D 500 each.  . cetirizine (ZYRTEC) 10 MG tablet Take 1 tablet by mouth daily.  . Cholecalciferol (VITAMIN D3) 1000 UNITS  CAPS Take 2 by mouth daily  . hydrALAZINE (APRESOLINE) 50 MG tablet TAKE ONE TABLET BY MOUTH THREE TIMES DAILY  . hydroxyurea (HYDREA) 500 MG capsule Take 500 mg by mouth daily. May take with food to minimize GI side effects.  [name] lisinopril (PRINIVIL,ZESTRIL) 40 MG tablet TAKE ONE TABLET BY MOUTH ONCE DAILY  . [DISCONTINUED] TDaP (BOOSTRIX) 5-2.5-18.5 LF-MCG/0.5 injection Inject 0.5 mL into the muscle once.  . Nebivolol HCl 20 MG TABS      No orders of the defined types were placed in this encounter.    No Follow-up on file.

## 2013-12-17 NOTE — Patient Instructions (Addendum)
You are suffering from Xerosis (extrremely dry skin )    Stop the dial soap.  It is too drying   Use Aveeno body wash  And Aveeno moisturizer with oatmeal.  Increase your water or  G2 to 3 20 ounce servings daily    Try taking fish oil one capsule daily with dinner    You can also try omitting your antihistamine Zyrtec and  Using Nasocort instead  (2 squiets each side once daily)  .

## 2013-12-18 DIAGNOSIS — L853 Xerosis cutis: Secondary | ICD-10-CM | POA: Insufficient documentation

## 2013-12-18 NOTE — Assessment & Plan Note (Signed)
Recommended change in sap to Aveeno and use of aveeno liquid sopa and aveeno colloidal oatmeal moisturizer post cleansing,  Stop zyrtec,  yse nasal steroids,  Increase water intake and add humidifier to house.   Derm referral if no improvement in 2-4 weeks

## 2013-12-24 ENCOUNTER — Other Ambulatory Visit: Payer: Self-pay | Admitting: Internal Medicine

## 2013-12-24 ENCOUNTER — Ambulatory Visit: Payer: Self-pay | Admitting: Internal Medicine

## 2013-12-24 LAB — CBC CANCER CENTER
Basophil #: 0 x10 3/mm (ref 0.0–0.1)
Basophil %: 0.8 %
Eosinophil #: 0.1 x10 3/mm (ref 0.0–0.7)
Eosinophil %: 1.6 %
HCT: 41.1 % (ref 40.0–52.0)
HGB: 13.7 g/dL (ref 13.0–18.0)
LYMPHS PCT: 20.5 %
Lymphocyte #: 1.3 x10 3/mm (ref 1.0–3.6)
MCH: 38 pg — AB (ref 26.0–34.0)
MCHC: 33.5 g/dL (ref 32.0–36.0)
MCV: 114 fL — ABNORMAL HIGH (ref 80–100)
Monocyte #: 0.6 x10 3/mm (ref 0.2–1.0)
Monocyte %: 9.5 %
Neutrophil #: 4.1 x10 3/mm (ref 1.4–6.5)
Neutrophil %: 67.6 %
PLATELETS: 286 x10 3/mm (ref 150–440)
RBC: 3.62 10*6/uL — AB (ref 4.40–5.90)
RDW: 12.5 % (ref 11.5–14.5)
WBC: 6.1 x10 3/mm (ref 3.8–10.6)

## 2013-12-24 LAB — CREATININE, SERUM
Creatinine: 1.15 mg/dL (ref 0.60–1.30)
EGFR (Non-African Amer.): >60

## 2013-12-24 LAB — HEPATIC FUNCTION PANEL A (ARMC)
ALK PHOS: 57 U/L
ALT: 19 U/L
Albumin: 4 g/dL (ref 3.4–5.0)
BILIRUBIN DIRECT: 0.2 mg/dL (ref 0.0–0.2)
Bilirubin,Total: 0.9 mg/dL (ref 0.2–1.0)
SGOT(AST): 23 U/L (ref 15–37)
Total Protein: 6.8 g/dL (ref 6.4–8.2)

## 2013-12-25 ENCOUNTER — Telehealth: Payer: Self-pay | Admitting: Internal Medicine

## 2013-12-25 NOTE — Telephone Encounter (Signed)
Labs received from [facility] and are normal.   Please abstract.  2) i cannot order the CTS ordered by the [facility] of chest abd and pelvis bc I do not know why they have been requested

## 2013-12-26 NOTE — Telephone Encounter (Signed)
Patient notified and voiced understanding.

## 2013-12-30 ENCOUNTER — Ambulatory Visit: Payer: Self-pay | Admitting: Internal Medicine

## 2014-01-03 ENCOUNTER — Other Ambulatory Visit: Payer: Self-pay | Admitting: *Deleted

## 2014-01-03 ENCOUNTER — Other Ambulatory Visit: Payer: Self-pay | Admitting: Internal Medicine

## 2014-01-03 MED ORDER — LISINOPRIL 40 MG PO TABS: ORAL_TABLET | ORAL | Status: DC

## 2014-03-26 ENCOUNTER — Other Ambulatory Visit: Payer: Self-pay | Admitting: *Deleted

## 2014-03-26 MED ORDER — ALENDRONATE SODIUM 70 MG PO TABS: ORAL_TABLET | ORAL | Status: DC

## 2014-04-16 ENCOUNTER — Ambulatory Visit: Payer: Self-pay | Admitting: Internal Medicine

## 2014-04-30 ENCOUNTER — Ambulatory Visit
Admit: 2014-04-30 | Disposition: A | Discharge status: Home / Self Care | Payer: Self-pay | Attending: Internal Medicine | Admitting: Internal Medicine

## 2014-07-15 ENCOUNTER — Other Ambulatory Visit: Payer: Self-pay | Admitting: Internal Medicine

## 2014-08-02 ENCOUNTER — Other Ambulatory Visit: Payer: Self-pay

## 2014-08-02 DIAGNOSIS — D473 Essential (hemorrhagic) thrombocythemia: Secondary | ICD-10-CM

## 2014-08-05 ENCOUNTER — Inpatient Hospital Stay: Payer: PPO | Attending: Internal Medicine

## 2014-08-05 ENCOUNTER — Inpatient Hospital Stay (HOSPITAL_BASED_OUTPATIENT_CLINIC_OR_DEPARTMENT_OTHER): Payer: PPO | Admitting: Internal Medicine

## 2014-08-05 VITALS — BP 186/70 | HR 71 | Temp 96.2°F | Resp 18 | Ht 69.0 in | Wt 157.8 lb

## 2014-08-05 DIAGNOSIS — D473 Essential (hemorrhagic) thrombocythemia: Secondary | ICD-10-CM

## 2014-08-05 DIAGNOSIS — D7589 Other specified diseases of blood and blood-forming organs: Secondary | ICD-10-CM | POA: Insufficient documentation

## 2014-08-05 DIAGNOSIS — R634 Abnormal weight loss: Secondary | ICD-10-CM

## 2014-08-05 DIAGNOSIS — D45 Polycythemia vera: Secondary | ICD-10-CM | POA: Diagnosis not present

## 2014-08-05 DIAGNOSIS — Z800 Family history of malignant neoplasm of digestive organs: Secondary | ICD-10-CM

## 2014-08-05 DIAGNOSIS — Z7982 Long term (current) use of aspirin: Secondary | ICD-10-CM | POA: Insufficient documentation

## 2014-08-05 DIAGNOSIS — Z79899 Other long term (current) drug therapy: Secondary | ICD-10-CM | POA: Diagnosis not present

## 2014-08-05 DIAGNOSIS — I10 Essential (primary) hypertension: Secondary | ICD-10-CM | POA: Diagnosis not present

## 2014-08-05 DIAGNOSIS — Z86010 Personal history of colonic polyps: Secondary | ICD-10-CM | POA: Insufficient documentation

## 2014-08-05 DIAGNOSIS — Z8673 Personal history of transient ischemic attack (TIA), and cerebral infarction without residual deficits: Secondary | ICD-10-CM

## 2014-08-05 DIAGNOSIS — E785 Hyperlipidemia, unspecified: Secondary | ICD-10-CM | POA: Insufficient documentation

## 2014-08-05 DIAGNOSIS — M810 Age-related osteoporosis without current pathological fracture: Secondary | ICD-10-CM

## 2014-08-05 LAB — CBC WITH DIFFERENTIAL/PLATELET
BASOS ABS: 0.1 10*3/uL (ref 0–0.1)
Basophils Relative: 1 %
EOS PCT: 2 %
Eosinophils Absolute: 0.1 10*3/uL (ref 0–0.7)
HEMATOCRIT: 40 % (ref 40.0–52.0)
Hemoglobin: 13.4 g/dL (ref 13.0–18.0)
LYMPHS PCT: 23 %
Lymphs Abs: 1.3 10*3/uL (ref 1.0–3.6)
MCH: 37.4 pg — ABNORMAL HIGH (ref 26.0–34.0)
MCHC: 33.5 g/dL (ref 32.0–36.0)
MCV: 111.4 fL — ABNORMAL HIGH (ref 80.0–100.0)
Monocytes Absolute: 0.6 10*3/uL (ref 0.2–1.0)
Monocytes Relative: 10 %
Neutro Abs: 3.9 10*3/uL (ref 1.4–6.5)
Neutrophils Relative %: 64 %
Platelets: 280 10*3/uL (ref 150–440)
RBC: 3.59 MIL/uL — ABNORMAL LOW (ref 4.40–5.90)
RDW: 12.9 % (ref 11.5–14.5)
WBC: 6 10*3/uL (ref 3.8–10.6)

## 2014-08-05 LAB — CREATININE, SERUM
CREATININE: 0.95 mg/dL (ref 0.61–1.24)
GFR calc Af Amer: >60 mL/min (ref >60)
GFR calc non Af Amer: >60 mL/min (ref >60)

## 2014-08-05 LAB — HEPATIC FUNCTION PANEL
ALK PHOS: 60 U/L (ref 38–126)
ALT: 12 U/L — AB (ref 17–63)
AST: 21 U/L (ref 15–41)
Albumin: 4.3 g/dL (ref 3.5–5.0)
Bilirubin, Direct: 0.1 mg/dL (ref 0.1–0.5)
Indirect Bilirubin: 0.7 mg/dL (ref 0.3–0.9)
Total Bilirubin: 0.8 mg/dL (ref 0.3–1.2)
Total Protein: 6.7 g/dL (ref 6.5–8.1)

## 2014-08-05 NOTE — Progress Notes (Signed)
   08/05/14 1000  Clinical Encounter Type  Visited With Patient and family together  Visit Type Initial  Spiritual Encounters  Spiritual Needs Prayer;Emotional  Provided pastoral presence and support.  Wife asked for prayers for her sister who has some health issues.  Prayed with wife.  Patient said he was doing fine.  Both thanked me for my visit.  [place] [phone]

## 2014-08-16 ENCOUNTER — Telehealth: Payer: Self-pay

## 2014-08-16 NOTE — Telephone Encounter (Signed)
The patient called and is hoping he can get an order for a colonoscopy placed.

## 2014-08-16 NOTE — Telephone Encounter (Signed)
According to health maintenance not due til 2017, left message for patient to call office.

## 2014-08-19 ENCOUNTER — Other Ambulatory Visit: Payer: Self-pay | Admitting: Internal Medicine

## 2014-08-19 ENCOUNTER — Encounter: Payer: Self-pay | Admitting: Internal Medicine

## 2014-08-19 ENCOUNTER — Ambulatory Visit (INDEPENDENT_AMBULATORY_CARE_PROVIDER_SITE_OTHER): Payer: PPO | Admitting: Internal Medicine

## 2014-08-19 VITALS — BP 148/72 | HR 76 | Temp 98.1°F | Resp 14 | Ht 69.0 in | Wt 158.5 lb

## 2014-08-19 DIAGNOSIS — Z1159 Encounter for screening for other viral diseases: Secondary | ICD-10-CM

## 2014-08-19 DIAGNOSIS — R634 Abnormal weight loss: Secondary | ICD-10-CM

## 2014-08-19 DIAGNOSIS — E559 Vitamin D deficiency, unspecified: Secondary | ICD-10-CM | POA: Diagnosis not present

## 2014-08-19 NOTE — Patient Instructions (Signed)
  We are investigating your weight loss with bloodwork to start  Please follow up next week to discuss the results.  Please consider trying the following meal supplements to add calories and protein to your daily meals :  Boost Carnation instant breakfast drink Muscle Milk Premier Protein

## 2014-08-19 NOTE — Telephone Encounter (Signed)
Patient coming into office today.

## 2014-08-19 NOTE — Progress Notes (Signed)
Subjective:  Patient ID: Jimmy Jimenez, male    DOB: 03/13/1925  Age: 81 y.o. MRN: [id number]  CC: The primary encounter diagnosis was Loss of weight. Diagnoses of Need for hepatitis C screening test and Vitamin D deficiency were also pertinent to this visit.  HPI Jimmy Jimenez presents for weight loss . He has lost 3 lbs in the last year but is down  20 lbs compared to 2014.  IInitially tried to lose weight at 180,  But weight has contineud to fall.    Has a good appetite and snacks on fruit in season,     Nocturia x 4,  Chronic .  Has chronic constipation , takes miralax.    Denies anxiety,  No hair  Loss.  Has penile pain occasionally brougt on yb no particular activity    Outpatient Prescriptions Prior to Visit  Medication Sig Dispense Refill  . alendronate (FOSAMAX) 70 MG tablet Take 1 tablet (70 mg total) by mouth every 7 (seven) days. Take with a full glass of water on an empty stomach. 12 tablet 1  . amLODipine (NORVASC) 5 MG tablet Take 1 tablet (5 mg total) by mouth daily. As needed for BP > 160 30 tablet 3  . aspirin 81 MG tablet Take 81 mg by mouth daily.      . Calcium Citrate-Vitamin D (CITRACAL + D PO) Take 2 by mouth daily, calcium 630 and vitamin D 500 each.    . Cholecalciferol (VITAMIN D3) 1000 UNITS CAPS Take 2 by mouth daily    . hydrALAZINE (APRESOLINE) 50 MG tablet TAKE ONE TABLET BY MOUTH THREE TIMES DAILY 90 tablet 5  . hydroxyurea (HYDREA) 500 MG capsule Take 500 mg by mouth daily. May take with food to minimize GI side effects.    [name] lisinopril (PRINIVIL,ZESTRIL) 40 MG tablet TAKE ONE TABLET BY MOUTH ONCE DAILY 30 tablet 5  . cetirizine (ZYRTEC) 10 MG tablet Take 1 tablet by mouth daily.    . Nebivolol HCl 20 MG TABS     . alendronate (FOSAMAX) 70 MG tablet TAKE ONE TABLET BY MOUTH ONCE A WEEK IN THE MORNING, TAKE WITH WATER 40 MINUTES PRIOR TO ANY FOOD AND REMAIN SITTING UP 12 tablet 1   No facility-administered medications prior to visit.    Review of  Systems;  Patient denies headache, fevers, malaise, unintentional weight loss, skin rash, eye pain, sinus congestion and sinus pain, sore throat, dysphagia,  hemoptysis , cough, dyspnea, wheezing, chest pain, palpitations, orthopnea, edema, abdominal pain, nausea, melena, diarrhea, constipation, flank pain, dysuria, hematuria, urinary  Frequency, nocturia, numbness, tingling, seizures,  Focal weakness, Loss of consciousness,  Tremor, insomnia, depression, anxiety, and suicidal ideation.      Objective:  BP 148/72 mmHg  Pulse 76  Temp(Src) 98.1 F (36.7 C) (Oral)  Resp 14  Ht 5\' 9"  (1.753 m)  Wt 158 lb 8 oz (71.895 kg)  BMI 23.40 kg/m2  SpO2 96%  BP Readings from Last 3 Encounters:  08/19/14 148/72  08/05/14 186/70  12/17/13 168/64    Wt Readings from Last 3 Encounters:  08/19/14 158 lb 8 oz (71.895 kg)  08/05/14 157 lb 13.6 oz (71.6 kg)  12/17/13 161 lb 12 oz (73.369 kg)    General appearance: alert, cooperative and appears stated age Ears: normal TM's and external ear canals both ears Throat: lips, mucosa, and tongue normal; teeth and gums normal Neck: no adenopathy, no carotid bruit, supple, symmetrical, trachea midline and thyroid not enlarged, symmetric, no  tenderness/mass/nodules Back: symmetric, no curvature. ROM normal. No CVA tenderness. Lungs: clear to auscultation bilaterally Heart: regular rate and rhythm, S1, S2 normal, no murmur, click, rub or gallop Abdomen: soft, non-tender; bowel sounds normal; no masses,  no organomegaly Pulses: 2+ and symmetric Skin: Skin color, texture, turgor normal. No rashes or lesions Lymph nodes: Cervical, supraclavicular, and axillary nodes normal.  No results found for: HGBA1C  Lab Results  Component Value Date   CREATININE 0.95 08/05/2014   CREATININE 1.15 12/24/2013   CREATININE 1.2 12/17/2013    Lab Results  Component Value Date   WBC 6.0 08/05/2014   HGB 13.4 08/05/2014   HCT 40.0 08/05/2014   PLT 280 08/05/2014    GLUCOSE 124* 09/26/2013   LDLDIRECT 103.7 09/26/2013   ALT 12* 08/05/2014   AST 21 08/05/2014   NA 135 09/26/2013   K 4.7 09/26/2013   CL 99 09/26/2013   CREATININE 0.95 08/05/2014   BUN 17 09/26/2013   CO2 30 09/26/2013   TSH 1.49 09/26/2013   MICROALBUR 0.7 08/30/2012    No results found.  Assessment & Plan:   Problem List Items Addressed This Visit    Loss of weight - Primary    20 lb wt loss in the last 2 years . Screening labs for hyperthyroidism,  Deficiencies.  Not depressed and per wife appetite is good. However review of diet suggests that his caloric intake is low.  Supplements advised and consider Remeron trial. Screening for occult malignancies with PSA, as well, will consider CT chest abdomen and pelvis if weight loss continues given his relatively excellent health.      Relevant Orders   Comprehensive metabolic panel   TSH   PSA, Medicare   Magnesium   POC Hemoccult Bld/Stl (3-Cd Home Screen)    Other Visit Diagnoses    Need for hepatitis C screening test        Relevant Orders    Hepatitis C antibody    Vitamin D deficiency        Relevant Orders    Vit D  25 hydroxy (rtn osteoporosis monitoring)       I am having Jimmy Jimenez maintain his aspirin, Calcium Citrate-Vitamin D (CITRACAL + D PO), Vitamin D3, alendronate, hydroxyurea, cetirizine, Nebivolol HCl, amLODipine, lisinopril, and hydrALAZINE.  No orders of the defined types were placed in this encounter.    Medications Discontinued During This Encounter  Medication Reason  . alendronate (FOSAMAX) 70 MG tablet Duplicate    Follow-up: Return in about 1 week (around 08/26/2014).   Crecencio Mc, MD

## 2014-08-20 ENCOUNTER — Encounter: Payer: Self-pay | Admitting: Internal Medicine

## 2014-08-20 DIAGNOSIS — R634 Abnormal weight loss: Secondary | ICD-10-CM | POA: Insufficient documentation

## 2014-08-20 LAB — PSA, MEDICARE: PSA: 0.88 ng/ml (ref 0.10–4.00)

## 2014-08-20 LAB — COMPREHENSIVE METABOLIC PANEL
ALT: 12 U/L (ref 0–53)
AST: 25 U/L (ref 0–37)
Albumin: 4.3 g/dL (ref 3.5–5.2)
Alkaline Phosphatase: 64 U/L (ref 39–117)
BUN: 13 mg/dL (ref 6–23)
CHLORIDE: 97 meq/L (ref 96–112)
CO2: 30 mEq/L (ref 19–32)
Calcium: 9.6 mg/dL (ref 8.4–10.5)
Creatinine, Ser: 0.95 mg/dL (ref 0.40–1.50)
GFR: 79.63 mL/min (ref >60.00)
Glucose, Bld: 71 mg/dL (ref 70–99)
Potassium: 4 mEq/L (ref 3.5–5.1)
Sodium: 134 mEq/L — ABNORMAL LOW (ref 135–145)
TOTAL PROTEIN: 7.1 g/dL (ref 6.0–8.3)
Total Bilirubin: 0.6 mg/dL (ref 0.2–1.2)

## 2014-08-20 LAB — VITAMIN D 25 HYDROXY (VIT D DEFICIENCY, FRACTURES): VITD: 39.61 ng/mL (ref 30.00–100.00)

## 2014-08-20 LAB — MAGNESIUM: MAGNESIUM: 2.2 mg/dL (ref 1.5–2.5)

## 2014-08-20 NOTE — Assessment & Plan Note (Signed)
20 lb wt loss in the last 2 years . Screening labs for hyperthyroidism,  Deficiencies.  Not depressed and per wife appetite is good. However review of diet suggests that his caloric intake is low.  Supplements advised and consider Remeron trial. Screening for occult malignancies with PSA, as well, will consider CT chest abdomen and pelvis if weight loss continues given his relatively excellent health.

## 2014-08-21 ENCOUNTER — Other Ambulatory Visit: Payer: Self-pay | Admitting: *Deleted

## 2014-08-21 DIAGNOSIS — Z1211 Encounter for screening for malignant neoplasm of colon: Secondary | ICD-10-CM

## 2014-08-21 LAB — TSH: TSH: 4.014 u[IU]/mL (ref 0.350–4.500)

## 2014-08-21 LAB — HEPATITIS C ANTIBODY: HCV Ab: NEGATIVE

## 2014-08-22 NOTE — Progress Notes (Signed)
[place]  [phone] [phone]     ID: Jimmy Jimenez OB: 03/13/1925  MR#: [id number]  ZYS#:[id number]  Patient Care Team: Crecencio Mc, MD as PCP - General (Internal Medicine)  CHIEF COMPLAINT/DIAGNOSIS:  Essential thrombocytosis. 01/18/07 Bone marrow biopsy and aspirate: Thrombocytosis. Mildly hypercellular bone marrow for age with slightly increased number of enlarged megakaryocytes. Cytogenetics 45X, -Y - 46XY. 01/18/07 negative for BCR-ABL. Negative for JAK2 mutation. Trace stainable iron. 12/14/06-11/08 Phlebotomy. 01/31/07 Hydrea 500 mg p.o. b.i.d. 01/23/08 Hydrea 500 mg po daily    HISTORY OF PRESENT ILLNESS:  Patient returns for hematology followup for essential thrombocytosis as described above. States he continues to take Hydrea 500 mg by mouth daily and denies any new side effects from this.  No mouth sores, dizziness, imbalance or diarrhea.  No fevers or night sweats.  No new bone pains. Ambulates slowly. Denies any recent thromboembolic phenomena since last visit here.  Denies any dyspnea, cough, chest pain.  No bleeding issues. Appetite is steady. No skin rash.  REVIEW OF SYSTEMS:   ROS As in HPI above. In addition, no fever, chills or sweats. No new headaches or focal weakness.  No sore throat, cough, shortness of breath, sputum, hemoptysis or chest pain. No dizziness or palpitation. No abdominal pain, constipation, diarrhea, dysuria or hematuria. No new skin rash or bleeding symptoms. No new paresthesias in extremities.   PAST MEDICAL HISTORY: Reviewed Past Medical History  Diagnosis Date  . Lung nodule 12/2004    found on CXR  . Essential thrombocytosis   . Hyperlipidemia   . Hypertension   . Osteoporosis   . Essential thrombocytosis           HTN  Hyperlipidemia  History of colonic polyps, last colonoscopy in 2007 by Dr. Allen Norris  Essential thrombocytosis  PAST SURGICAL HISTORY:Reviewed Past Surgical History  Procedure  Laterality Date  . Nasal sinus surgery    Image guided sinus surgery 06-11-05  FAMILY HISTORY:Reviewed Family History  Problem Relation Age of Onset  . Cancer Brother     colon  . Cancer Mother     colon  Significant for Diabetes. Colon cancer.   ADVANCED DIRECTIVES:  <no information>  SOCIAL HISTORY: Reviewed History  Substance Use Topics  . Smoking status: Never Smoker   . Smokeless tobacco: Never Used  . Alcohol Use: No  Married and lives with his wife. Denies Tobacco or ETOH use.  Allergies  Allergen Reactions  . Amlodipine     swelling  . Atenolol     swelling  . Furosemide     Dizziness   . Hctz [Hydrochlorothiazide]     hyponatremia  . Levofloxacin     Causes sleepiness  . Metoprolol     dizziness  . Micardis [Telmisartan]     Causes swelling  . Penicillins   . Terazosin     Causes swelling in legs and feet  . Triamterene-Hctz     Patient stated is causes low sodium    Current Outpatient Prescriptions  Medication Sig Dispense Refill  . alendronate (FOSAMAX) 70 MG tablet Take 1 tablet (70 mg total) by mouth every 7 (seven) days. Take with a full glass of water on an empty stomach. 12 tablet 1  . amLODipine (NORVASC) 5 MG tablet Take 1 tablet (5 mg total) by mouth daily. As needed for BP > 160 30 tablet 3  . aspirin 81 MG tablet Take 81 mg by mouth daily.      [name]  Calcium Citrate-Vitamin D (CITRACAL + D PO) Take 2 by mouth daily, calcium 630 and vitamin D 500 each.    . cetirizine (ZYRTEC) 10 MG tablet Take 1 tablet by mouth daily.    . Cholecalciferol (VITAMIN D3) 1000 UNITS CAPS Take 2 by mouth daily    . hydrALAZINE (APRESOLINE) 50 MG tablet TAKE ONE TABLET BY MOUTH THREE TIMES DAILY 90 tablet 5  . hydroxyurea (HYDREA) 500 MG capsule Take 500 mg by mouth daily. May take with food to minimize GI side effects.    [name] lisinopril (PRINIVIL,ZESTRIL) 40 MG tablet TAKE ONE TABLET BY MOUTH ONCE DAILY 30 tablet 5  . Nebivolol HCl 20 MG TABS      No current  facility-administered medications for this visit.    OBJECTIVE: Filed Vitals:   08/05/14 0959  BP: 186/70  Pulse:   Temp:   Resp:      Body mass index is 23.3 kg/(m^2).      GENERAL: Patient is alert and oriented and in no acute distress. There is no icterus. HEENT: EOMs intact. No cervical lymphadenopathy. CVS: S1S2, regular LUNGS: Bilaterally clear to auscultation, no rhonchi. ABDOMEN: Soft, nontender. No hepatosplenomegaly clinically.  EXTREMITIES: No pedal edema. LYMPHATICS: No palpable adenopathy in axillary or inguinal areas.   LAB RESULTS:    Component Value Date/Time   NA 134* 08/19/2014 1845   NA 136 05/19/2011 1049   K 4.0 08/19/2014 1845   K 4.3 05/19/2011 1049   CL 97 08/19/2014 1845   CL 99 05/19/2011 1049   CO2 30 08/19/2014 1845   CO2 32 05/19/2011 1049   GLUCOSE 71 08/19/2014 1845   GLUCOSE 116* 05/19/2011 1049   BUN 13 08/19/2014 1845   BUN 8 05/19/2011 1049   CREATININE 0.95 08/19/2014 1845   CREATININE 1.15 12/24/2013 0921   CREATININE 1.2 12/17/2013 0921   CALCIUM 9.6 08/19/2014 1845   CALCIUM 9.0 05/19/2011 1049   PROT 7.1 08/19/2014 1845   PROT 6.8 12/24/2013 0921   ALBUMIN 4.3 08/19/2014 1845   ALBUMIN 4.0 12/24/2013 0921   AST 25 08/19/2014 1845   AST 23 12/24/2013 0921   ALT 12 08/19/2014 1845   ALT 19 12/24/2013 0921   ALKPHOS 64 08/19/2014 1845   ALKPHOS 57 12/24/2013 0921   BILITOT 0.6 08/19/2014 1845   GFRNONAA >60 08/05/2014 0930   GFRNONAA 50* 09/03/2013 0930   GFRAA >60 08/05/2014 0930   GFRAA 58* 09/03/2013 0930   Lab Results  Component Value Date   WBC 6.0 08/05/2014   NEUTROABS 3.9 08/05/2014   HGB 13.4 08/05/2014   HCT 40.0 08/05/2014   MCV 111.4* 08/05/2014   PLT 280 08/05/2014    ASSESSMENT / PLAN:   1. Myeloproliferative Disorder (Essential thrombocytosis/Polycythemia vera. Had evidence of erythrocytosis and leukocytosis) -  Reviewed labs from today and d/w patient. Overall doing steady overall without any new  side effects from hydrea. No history of recurrent thromboembolic phenomena since last evaluation ( he has history of few TIAs prior to starting Hydrea for thrombocytosis, but has not had any recurrent TIAs since).  Blood counts are under good control, macrocytosis secondary to Hydrea side effect. Plan at this time is to continue current treatment with Hydrea 500 mg once daily.  Have recommended that we monitor CBC more frequently at least q 8 weeks, patient does not want to come more frequently than what he has been doing.  Plan therefore is to get labs at 16 weeks, and will see him back  at 32 weeks with repeat CBC, creatinine, LFT. 2. Macrocytosis - secondary to Hydrea effect. 3. Weight loss - has stabilized since October, denies symptoms to suggest any underlying malignancy, advised to monitor weight at home and notify if unintentional weight loss.  4. In between visits, the patient has been advised to call or come to the ER in case of fevers, bleeding, acute sickness or new symptoms. Patient is agreeable to this plan.   Leia Alf, MD   08/22/2014 4:42 AM

## 2014-08-23 ENCOUNTER — Encounter: Payer: Self-pay | Admitting: *Deleted

## 2014-08-29 ENCOUNTER — Encounter: Payer: Self-pay | Admitting: Internal Medicine

## 2014-08-29 ENCOUNTER — Ambulatory Visit (INDEPENDENT_AMBULATORY_CARE_PROVIDER_SITE_OTHER): Payer: PPO | Admitting: Internal Medicine

## 2014-08-29 VITALS — BP 164/80 | HR 73 | Temp 98.2°F | Resp 14 | Ht 69.0 in | Wt 156.8 lb

## 2014-08-29 DIAGNOSIS — R634 Abnormal weight loss: Secondary | ICD-10-CM

## 2014-08-29 DIAGNOSIS — R300 Dysuria: Secondary | ICD-10-CM

## 2014-08-29 LAB — POCT URINALYSIS DIPSTICK
Bilirubin, UA: NEGATIVE
GLUCOSE UA: NEGATIVE
Ketones, UA: NEGATIVE
Leukocytes, UA: NEGATIVE
Nitrite, UA: NEGATIVE
PROTEIN UA: NEGATIVE
RBC UA: NEGATIVE
SPEC GRAV UA: 1.01
Urobilinogen, UA: 0.2
pH, UA: 6

## 2014-08-29 LAB — URINALYSIS, ROUTINE W REFLEX MICROSCOPIC
BILIRUBIN URINE: NEGATIVE
Hgb urine dipstick: NEGATIVE
Ketones, ur: NEGATIVE
LEUKOCYTES UA: NEGATIVE
Nitrite: NEGATIVE
RBC / HPF: NONE SEEN (ref 0–?)
SPECIFIC GRAVITY, URINE: 1.015 (ref 1.000–1.030)
TOTAL PROTEIN, URINE-UPE24: NEGATIVE
URINE GLUCOSE: NEGATIVE
Urobilinogen, UA: 0.2 (ref 0.0–1.0)
WBC UA: NONE SEEN (ref 0–?)
pH: 6 (ref 5.0–8.0)

## 2014-08-29 MED ORDER — MIRTAZAPINE 7.5 MG PO TABS: 7.5000 mg | ORAL_TABLET | Freq: Every day | ORAL | Status: DC

## 2014-08-29 NOTE — Progress Notes (Signed)
Subjective:  Patient ID: Jimmy Jimenez, male    DOB: 03/13/1925  Age: 81 y.o. MRN: [id number]  CC: The primary encounter diagnosis was Dysuria. A diagnosis of Loss of weight was also pertinent to this visit.  HPI Jimmy Jimenez presents for follow up on weight loss , unintentional.  All screening labs were normal except total protein was a little low. Did not return the stool card. , but mailed it back,  And results are pending,  Has lost 2 more pounds. Screening for occult malignancy with CT chest abdomen and pelvis was discussed, patient states that the [facility] did a CT scan  A few month ago for the same evaluation and it was reportedly normal. it was normal.      Recent episode  of dysuria lasting < 24 hours.    Outpatient Prescriptions Prior to Visit  Medication Sig Dispense Refill  . alendronate (FOSAMAX) 70 MG tablet Take 1 tablet (70 mg total) by mouth every 7 (seven) days. Take with a full glass of water on an empty stomach. 12 tablet 1  . amLODipine (NORVASC) 5 MG tablet Take 1 tablet (5 mg total) by mouth daily. As needed for BP > 160 30 tablet 3  . aspirin 81 MG tablet Take 81 mg by mouth daily.      . Calcium Citrate-Vitamin D (CITRACAL + D PO) Take 2 by mouth daily, calcium 630 and vitamin D 500 each.    . cetirizine (ZYRTEC) 10 MG tablet Take 1 tablet by mouth daily.    . Cholecalciferol (VITAMIN D3) 1000 UNITS CAPS Take 2 by mouth daily    . hydrALAZINE (APRESOLINE) 50 MG tablet TAKE ONE TABLET BY MOUTH THREE TIMES DAILY 90 tablet 5  . hydroxyurea (HYDREA) 500 MG capsule Take 500 mg by mouth daily. May take with food to minimize GI side effects.    [name] lisinopril (PRINIVIL,ZESTRIL) 40 MG tablet TAKE ONE TABLET BY MOUTH ONCE DAILY 30 tablet 5  . Nebivolol HCl 20 MG TABS      No facility-administered medications prior to visit.    Review of Systems;  Patient denies headache, fevers, malaise, unintentional weight loss, skin rash, eye pain, sinus congestion and sinus pain,  sore throat, dysphagia,  hemoptysis , cough, dyspnea, wheezing, chest pain, palpitations, orthopnea, edema, abdominal pain, nausea, melena, diarrhea, constipation, flank pain, dysuria, hematuria, urinary  Frequency, nocturia, numbness, tingling, seizures,  Focal weakness, Loss of consciousness,  Tremor, insomnia, depression, anxiety, and suicidal ideation.      Objective:  BP 164/80 mmHg  Pulse 73  Temp(Src) 98.2 F (36.8 C) (Oral)  Resp 14  Ht 5\' 9"  (1.753 m)  Wt 156 lb 12 oz (71.101 kg)  BMI 23.14 kg/m2  SpO2 98%  BP Readings from Last 3 Encounters:  08/29/14 164/80  08/19/14 148/72  08/05/14 186/70    Wt Readings from Last 3 Encounters:  08/29/14 156 lb 12 oz (71.101 kg)  08/19/14 158 lb 8 oz (71.895 kg)  08/05/14 157 lb 13.6 oz (71.6 kg)    General appearance: alert, cooperative and appears stated age Ears: normal TM's and external ear canals both ears Throat: lips, mucosa, and tongue normal; teeth and gums normal Neck: no adenopathy, no carotid bruit, supple, symmetrical, trachea midline and thyroid not enlarged, symmetric, no tenderness/mass/nodules Back: symmetric, no curvature. ROM normal. No CVA tenderness. Lungs: clear to auscultation bilaterally Heart: regular rate and rhythm, S1, S2 normal, no murmur, click, rub or gallop Abdomen: soft, non-tender; bowel sounds  normal; no masses,  no organomegaly Pulses: 2+ and symmetric Skin: Skin color, texture, turgor normal. No rashes or lesions Lymph nodes: Cervical, supraclavicular, and axillary nodes normal.  No results found for: HGBA1C  Lab Results  Component Value Date   CREATININE 0.95 08/19/2014   CREATININE 0.95 08/05/2014   CREATININE 1.15 12/24/2013    Lab Results  Component Value Date   WBC 6.0 08/05/2014   HGB 13.4 08/05/2014   HCT 40.0 08/05/2014   PLT 280 08/05/2014   GLUCOSE 71 08/19/2014   LDLDIRECT 103.7 09/26/2013   ALT 12 08/19/2014   AST 25 08/19/2014   NA 134* 08/19/2014   K 4.0  08/19/2014   CL 97 08/19/2014   CREATININE 0.95 08/19/2014   BUN 13 08/19/2014   CO2 30 08/19/2014   TSH 4.014 08/19/2014   PSA 0.88 08/19/2014   MICROALBUR 0.7 08/30/2012    No results found.  Assessment & Plan:   Problem List Items Addressed This Visit      Unprioritized   Loss of weight    Reviewed recent labs which have been normal . Awaiting records from [facility] .  Diet reviewed,  Trial of mirtazipine        Other Visit Diagnoses    Dysuria    -  Primary    Relevant Orders    POCT Urinalysis Dipstick (Completed)    Urine Culture    Urinalysis, Routine w reflex microscopic (Completed)       I am having Mr. Ohair start on mirtazapine. I am also having him maintain his aspirin, Calcium Citrate-Vitamin D (CITRACAL + D PO), Vitamin D3, alendronate, hydroxyurea, cetirizine, Nebivolol HCl, amLODipine, lisinopril, and hydrALAZINE.  Meds ordered this encounter  Medications  . mirtazapine (REMERON) 7.5 MG tablet    Sig: Take 1 tablet (7.5 mg total) by mouth at bedtime.    Dispense:  30 tablet    Refill:  3    There are no discontinued medications.  Follow-up: Return in about 4 weeks (around 09/26/2014).   Crecencio Mc, MD

## 2014-08-29 NOTE — Patient Instructions (Signed)
Your labs were normal,  Since the [place] did the imaging test we discussed today,  You do not need any additional testing at this time.  I am starting you on a medication to improve your appetite called mirtazapine .  Take it at bedtime  Try to include peanut butter and cheese every day in your diet, between meals.  You can also add Boost or Ensure daily   Return in one month

## 2014-08-31 ENCOUNTER — Encounter: Payer: Self-pay | Admitting: Internal Medicine

## 2014-08-31 NOTE — Assessment & Plan Note (Signed)
Reviewed recent labs which have been normal . Awaiting records from [facility] .  Diet reviewed,  Trial of mirtazipine

## 2014-09-01 LAB — URINE CULTURE
Colony Count: NO GROWTH
ORGANISM ID, BACTERIA: NO GROWTH

## 2014-09-05 ENCOUNTER — Ambulatory Visit: Payer: PPO | Admitting: Internal Medicine

## 2014-09-26 ENCOUNTER — Encounter: Payer: Self-pay | Admitting: Internal Medicine

## 2014-09-26 ENCOUNTER — Ambulatory Visit (INDEPENDENT_AMBULATORY_CARE_PROVIDER_SITE_OTHER): Payer: PPO | Admitting: Internal Medicine

## 2014-09-26 VITALS — BP 144/60 | HR 78 | Temp 98.4°F | Resp 16 | Ht 69.0 in | Wt 165.0 lb

## 2014-09-26 DIAGNOSIS — R634 Abnormal weight loss: Secondary | ICD-10-CM

## 2014-09-26 DIAGNOSIS — I10 Essential (primary) hypertension: Secondary | ICD-10-CM | POA: Diagnosis not present

## 2014-09-26 DIAGNOSIS — Z0000 Encounter for general adult medical examination without abnormal findings: Secondary | ICD-10-CM

## 2014-09-26 NOTE — Progress Notes (Signed)
Subjective:  Patient ID: Jimmy Jimenez, male    DOB: 03/13/1925  Age: 81 y.o. MRN: [id number]  CC: The primary encounter diagnosis was Medicare annual wellness visit, subsequent. Diagnoses of Loss of weight and Essential hypertension were also pertinent to this visit.  HPI Jimmy Jimenez presents for follow up on unintentional weight loss.  In the past month he has gained 7 lbs with addition of mirtazipine .  Has more energy  And pants are now staying up,  enis pain except occasional pain in left groin  Which is fleeting  Bowels have been moving every other day,  Occasional urinary hesitancy . He denies pain, shortness of breath and feelings of depression       Outpatient Prescriptions Prior to Visit  Medication Sig Dispense Refill  . alendronate (FOSAMAX) 70 MG tablet Take 1 tablet (70 mg total) by mouth every 7 (seven) days. Take with a full glass of water on an empty stomach. 12 tablet 1  . amLODipine (NORVASC) 5 MG tablet Take 1 tablet (5 mg total) by mouth daily. As needed for BP > 160 30 tablet 3  . aspirin 81 MG tablet Take 81 mg by mouth daily.      . Calcium Citrate-Vitamin D (CITRACAL + D PO) Take 2 by mouth daily, calcium 630 and vitamin D 500 each.    . cetirizine (ZYRTEC) 10 MG tablet Take 1 tablet by mouth daily.    . Cholecalciferol (VITAMIN D3) 1000 UNITS CAPS Take 2 by mouth daily    . hydrALAZINE (APRESOLINE) 50 MG tablet TAKE ONE TABLET BY MOUTH THREE TIMES DAILY 90 tablet 5  . hydroxyurea (HYDREA) 500 MG capsule Take 500 mg by mouth daily. May take with food to minimize GI side effects.    [name] lisinopril (PRINIVIL,ZESTRIL) 40 MG tablet TAKE ONE TABLET BY MOUTH ONCE DAILY 30 tablet 5  . mirtazapine (REMERON) 7.5 MG tablet Take 1 tablet (7.5 mg total) by mouth at bedtime. 30 tablet 3  . Nebivolol HCl 20 MG TABS      No facility-administered medications prior to visit.    Review of Systems;  Patient denies headache, fevers, malaise, unintentional weight loss, skin rash,  eye pain, sinus congestion and sinus pain, sore throat, dysphagia,  hemoptysis , cough, dyspnea, wheezing, chest pain, palpitations, orthopnea, edema, abdominal pain, nausea, melena, diarrhea, constipation, flank pain, dysuria, hematuria, urinary  Frequency, nocturia, numbness, tingling, seizures,  Focal weakness, Loss of consciousness,  Tremor, insomnia, depression, anxiety, and suicidal ideation.      Objective:  BP 144/60 mmHg  Pulse 78  Temp(Src) 98.4 F (36.9 C) (Oral)  Resp 16  Ht 5\' 9"  (1.753 m)  Wt 165 lb (74.844 kg)  BMI 24.36 kg/m2  SpO2 95%  BP Readings from Last 3 Encounters:  09/26/14 144/60  08/29/14 164/80  08/19/14 148/72    Wt Readings from Last 3 Encounters:  09/26/14 165 lb (74.844 kg)  08/29/14 156 lb 12 oz (71.101 kg)  08/19/14 158 lb 8 oz (71.895 kg)   BP 144/60 mmHg  Pulse 78  Temp(Src) 98.4 F (36.9 C) (Oral)  Resp 16  Ht 5\' 9"  (1.753 m)  Wt 165 lb (74.844 kg)  BMI 24.36 kg/m2  SpO2 95%  General Appearance:    Alert, cooperative, no distress, appears stated age  Head:    Normocephalic, without obvious abnormality, atraumatic  Eyes:    PERRL, conjunctiva/corneas clear, EOM's intact, fundi    benign, both eyes  Ears:    Normal TM's and external ear canals, both ears  Nose:   Nares normal, septum midline, mucosa normal, no drainage   or sinus tenderness  Throat:   Lips, mucosa, and tongue normal; teeth and gums normal  Neck:   Supple, symmetrical, trachea midline, no adenopathy;       thyroid:  No enlargement/tenderness/nodules; no carotid   bruit or JVD  Back:     Symmetric, no curvature, ROM normal, no CVA tenderness  Lungs:     Clear to auscultation bilaterally, respirations unlabored  Chest wall:    No tenderness or deformity  Heart:    Regular rate and rhythm, S1 and S2 normal, no murmur, rub   or gallop  Abdomen:     Soft, non-tender, bowel sounds active all four quadrants,    no masses, no organomegaly  Genitalia:    Normal male  without lesion, discharge or tenderness  Rectal:    Normal tone, normal prostate, no masses or tenderness;   guaiac negative stool  Extremities:   Extremities normal, atraumatic, no cyanosis or edema  Pulses:   2+ and symmetric all extremities  Skin:   Skin color, texture, turgor normal, no rashes or lesions  Lymph nodes:   Cervical, supraclavicular, and axillary nodes normal  Neurologic:   CNII-XII intact. Normal strength, sensation and reflexes      throughout    No results found for: HGBA1C  Lab Results  Component Value Date   CREATININE 0.95 08/19/2014   CREATININE 0.95 08/05/2014   CREATININE 1.15 12/24/2013    Lab Results  Component Value Date   WBC 6.0 08/05/2014   HGB 13.4 08/05/2014   HCT 40.0 08/05/2014   PLT 280 08/05/2014   GLUCOSE 71 08/19/2014   LDLDIRECT 103.7 09/26/2013   ALT 12 08/19/2014   AST 25 08/19/2014   NA 134* 08/19/2014   K 4.0 08/19/2014   CL 97 08/19/2014   CREATININE 0.95 08/19/2014   BUN 13 08/19/2014   CO2 30 08/19/2014   TSH 4.014 08/19/2014   PSA 0.88 08/19/2014   MICROALBUR 0.7 08/30/2012    No results found.  Assessment & Plan:   Problem List Items Addressed This Visit      Unprioritized   Hypertension    .BP 144/60 mmHg  Pulse 78  Temp(Src) 98.4 F (36.9 C) (Oral)  Resp 16  Ht 5\' 9"  (1.753 m)  Wt 165 lb (74.844 kg)  BMI 24.36 kg/m2  SpO2 95% Optimal control has been difficult due to multiple intolerances      Loss of weight    Resolved with initiation of remeron , low dose..   Continue current dose.       Medicare annual wellness visit, subsequent - Primary    Annual Medicare wellness  exam was done as well as a comprehensive physical exam and management of acute and chronic conditions .  During the course of the visit the patient was educated and counseled about appropriate screening and preventive services including : fall prevention , diabetes screening, nutrition counseling, colorectal cancer screening, and  recommended immunizations.  Printed recommendations for health maintenance screenings was given.          I am having Jimmy Jimenez maintain his aspirin, Calcium Citrate-Vitamin D (CITRACAL + D PO), Vitamin D3, alendronate, hydroxyurea, cetirizine, Nebivolol HCl, amLODipine, lisinopril, hydrALAZINE, and mirtazapine.  No orders of the defined types were placed in this encounter.    There are no discontinued medications.  Follow-up:  Return in about 4 weeks (around 10/24/2014).   Crecencio Mc, MD

## 2014-09-26 NOTE — Assessment & Plan Note (Signed)

## 2014-09-26 NOTE — Patient Instructions (Addendum)
Your prostate exam was normal.  You have gained weight since starting the medicine.!!  We will continue it for 6 months

## 2014-09-26 NOTE — Progress Notes (Signed)
Pre-visit discussion using our clinic review tool. No additional management support is needed unless otherwise documented below in the visit note.  

## 2014-09-28 NOTE — Assessment & Plan Note (Signed)
.  BP 144/60 mmHg  Pulse 78  Temp(Src) 98.4 F (36.9 C) (Oral)  Resp 16  Ht 5\' 9"  (1.753 m)  Wt 165 lb (74.844 kg)  BMI 24.36 kg/m2  SpO2 95% Optimal control has been difficult due to multiple intolerances

## 2014-09-28 NOTE — Assessment & Plan Note (Signed)
Resolved with initiation of remeron , low dose..   Continue current dose.

## 2014-10-03 ENCOUNTER — Other Ambulatory Visit: Payer: Self-pay | Admitting: Internal Medicine

## 2014-10-05 ENCOUNTER — Other Ambulatory Visit: Payer: Self-pay | Admitting: Internal Medicine

## 2014-11-25 ENCOUNTER — Inpatient Hospital Stay: Payer: PPO | Attending: Internal Medicine

## 2014-12-07 ENCOUNTER — Observation Stay
Admission: EM | Admit: 2014-12-07 | Discharge: 2014-12-08 | Disposition: A | Discharge status: Home / Self Care | Payer: PPO | Attending: Specialist | Admitting: Specialist

## 2014-12-07 ENCOUNTER — Encounter: Payer: Self-pay | Admitting: Emergency Medicine

## 2014-12-07 DIAGNOSIS — E871 Hypo-osmolality and hyponatremia: Secondary | ICD-10-CM | POA: Insufficient documentation

## 2014-12-07 DIAGNOSIS — R600 Localized edema: Secondary | ICD-10-CM | POA: Insufficient documentation

## 2014-12-07 DIAGNOSIS — D473 Essential (hemorrhagic) thrombocythemia: Secondary | ICD-10-CM | POA: Diagnosis not present

## 2014-12-07 DIAGNOSIS — R001 Bradycardia, unspecified: Secondary | ICD-10-CM | POA: Diagnosis not present

## 2014-12-07 DIAGNOSIS — Z881 Allergy status to other antibiotic agents status: Secondary | ICD-10-CM | POA: Insufficient documentation

## 2014-12-07 DIAGNOSIS — I10 Essential (primary) hypertension: Secondary | ICD-10-CM | POA: Insufficient documentation

## 2014-12-07 DIAGNOSIS — K922 Gastrointestinal hemorrhage, unspecified: Principal | ICD-10-CM | POA: Insufficient documentation

## 2014-12-07 DIAGNOSIS — I272 Other secondary pulmonary hypertension: Secondary | ICD-10-CM | POA: Diagnosis not present

## 2014-12-07 DIAGNOSIS — J310 Chronic rhinitis: Secondary | ICD-10-CM | POA: Insufficient documentation

## 2014-12-07 DIAGNOSIS — M810 Age-related osteoporosis without current pathological fracture: Secondary | ICD-10-CM | POA: Insufficient documentation

## 2014-12-07 DIAGNOSIS — Z880 Allergy status to penicillin: Secondary | ICD-10-CM | POA: Diagnosis not present

## 2014-12-07 DIAGNOSIS — Z800 Family history of malignant neoplasm of digestive organs: Secondary | ICD-10-CM | POA: Diagnosis not present

## 2014-12-07 DIAGNOSIS — Z23 Encounter for immunization: Secondary | ICD-10-CM | POA: Insufficient documentation

## 2014-12-07 DIAGNOSIS — R911 Solitary pulmonary nodule: Secondary | ICD-10-CM | POA: Diagnosis not present

## 2014-12-07 DIAGNOSIS — L853 Xerosis cutis: Secondary | ICD-10-CM | POA: Diagnosis not present

## 2014-12-07 DIAGNOSIS — K625 Hemorrhage of anus and rectum: Secondary | ICD-10-CM | POA: Diagnosis present

## 2014-12-07 DIAGNOSIS — Z7982 Long term (current) use of aspirin: Secondary | ICD-10-CM | POA: Diagnosis not present

## 2014-12-07 DIAGNOSIS — Z79899 Other long term (current) drug therapy: Secondary | ICD-10-CM | POA: Diagnosis not present

## 2014-12-07 DIAGNOSIS — Z888 Allergy status to other drugs, medicaments and biological substances status: Secondary | ICD-10-CM | POA: Diagnosis not present

## 2014-12-07 DIAGNOSIS — E785 Hyperlipidemia, unspecified: Secondary | ICD-10-CM | POA: Diagnosis not present

## 2014-12-07 LAB — CBC
HEMATOCRIT: 43.2 % (ref 40.0–52.0)
HEMOGLOBIN: 14.3 g/dL (ref 13.0–18.0)
MCH: 36.2 pg — AB (ref 26.0–34.0)
MCHC: 33.2 g/dL (ref 32.0–36.0)
MCV: 109.2 fL — ABNORMAL HIGH (ref 80.0–100.0)
Platelets: 356 10*3/uL (ref 150–440)
RBC: 3.96 MIL/uL — ABNORMAL LOW (ref 4.40–5.90)
RDW: 13.3 % (ref 11.5–14.5)
WBC: 13.2 10*3/uL — ABNORMAL HIGH (ref 3.8–10.6)

## 2014-12-07 LAB — TYPE AND SCREEN
ABO/RH(D): O POS
ANTIBODY SCREEN: NEGATIVE

## 2014-12-07 LAB — COMPREHENSIVE METABOLIC PANEL
ALBUMIN: 4.3 g/dL (ref 3.5–5.0)
ALK PHOS: 68 U/L (ref 38–126)
ALT: 13 U/L — ABNORMAL LOW (ref 17–63)
AST: 26 U/L (ref 15–41)
Anion gap: 9 (ref 5–15)
BILIRUBIN TOTAL: 1 mg/dL (ref 0.3–1.2)
BUN: 12 mg/dL (ref 6–20)
CO2: 28 mmol/L (ref 22–32)
Calcium: 9.5 mg/dL (ref 8.9–10.3)
Chloride: 97 mmol/L — ABNORMAL LOW (ref 101–111)
Creatinine, Ser: 0.97 mg/dL (ref 0.61–1.24)
GFR calc Af Amer: >60 mL/min (ref >60)
GFR calc non Af Amer: >60 mL/min (ref >60)
GLUCOSE: 126 mg/dL — AB (ref 65–99)
Potassium: 4.3 mmol/L (ref 3.5–5.1)
SODIUM: 134 mmol/L — AB (ref 135–145)
TOTAL PROTEIN: 6.9 g/dL (ref 6.5–8.1)

## 2014-12-07 LAB — ABO/RH: ABO/RH(D): O POS

## 2014-12-07 LAB — HEMOGLOBIN
HEMOGLOBIN: 13.9 g/dL (ref 13.0–18.0)
HEMOGLOBIN: 13.2 g/dL (ref 13.0–18.0)

## 2014-12-07 LAB — TROPONIN I

## 2014-12-07 MED ORDER — ACETAMINOPHEN 325 MG PO TABS: 650.0000 mg | ORAL_TABLET | Freq: Four times a day (QID) | ORAL | Status: DC | PRN

## 2014-12-07 MED ORDER — ACETAMINOPHEN 650 MG RE SUPP: 650.0000 mg | Freq: Four times a day (QID) | RECTAL | Status: DC | PRN

## 2014-12-07 MED ORDER — LISINOPRIL 20 MG PO TABS
40.0000 mg | ORAL_TABLET | Freq: Every day | ORAL | Status: DC
  Administered 2014-12-07 – 2014-12-08 (×2): 40 mg via ORAL
  Filled 2014-12-07 (×2): qty 2

## 2014-12-07 MED ORDER — ONDANSETRON HCL 4 MG/2ML IJ SOLN: 4.0000 mg | Freq: Four times a day (QID) | INTRAMUSCULAR | Status: DC | PRN

## 2014-12-07 MED ORDER — ONDANSETRON HCL 4 MG PO TABS: 4.0000 mg | ORAL_TABLET | Freq: Four times a day (QID) | ORAL | Status: DC | PRN

## 2014-12-07 MED ORDER — PANTOPRAZOLE SODIUM 40 MG IV SOLR
80.0000 mg | Freq: Once | INTRAVENOUS | Status: AC
  Administered 2014-12-07: 80 mg via INTRAVENOUS
  Filled 2014-12-07: qty 80

## 2014-12-07 MED ORDER — SODIUM CHLORIDE 0.9 % IV SOLN
8.0000 mg/h | INTRAVENOUS | Status: DC
  Administered 2014-12-07 (×2): 8 mg/h via INTRAVENOUS
  Filled 2014-12-07 (×2): qty 80

## 2014-12-07 MED ORDER — VITAMIN D 1000 UNITS PO TABS
1000.0000 [IU] | ORAL_TABLET | Freq: Two times a day (BID) | ORAL | Status: DC
  Administered 2014-12-07 – 2014-12-08 (×3): 1000 [IU] via ORAL
  Filled 2014-12-07 (×3): qty 1

## 2014-12-07 MED ORDER — HYDROXYUREA 500 MG PO CAPS
500.0000 mg | ORAL_CAPSULE | Freq: Every day | ORAL | Status: DC
  Administered 2014-12-08: 500 mg via ORAL
  Filled 2014-12-07 (×2): qty 1

## 2014-12-07 MED ORDER — INFLUENZA VAC SPLIT QUAD 0.5 ML IM SUSY
0.5000 mL | PREFILLED_SYRINGE | INTRAMUSCULAR | Status: AC
  Administered 2014-12-08: 0.5 mL via INTRAMUSCULAR
  Filled 2014-12-07: qty 0.5

## 2014-12-07 MED ORDER — HYDRALAZINE HCL 50 MG PO TABS
50.0000 mg | ORAL_TABLET | Freq: Three times a day (TID) | ORAL | Status: DC
  Administered 2014-12-07 – 2014-12-08 (×4): 50 mg via ORAL
  Filled 2014-12-07 (×4): qty 1

## 2014-12-07 MED ORDER — AMLODIPINE BESYLATE 5 MG PO TABS: 5.0000 mg | ORAL_TABLET | Freq: Every day | ORAL | Status: DC | PRN

## 2014-12-07 MED ORDER — ALENDRONATE SODIUM 70 MG PO TABS: 70.0000 mg | ORAL_TABLET | ORAL | Status: DC

## 2014-12-07 NOTE — H&P (Signed)
[facility] [place] NAME: Jimmy Jimenez    MR#:  [id number]  DATE OF BIRTH:  03/13/1925  DATE OF ADMISSION:  12/07/2014  PRIMARY CARE PHYSICIAN: Crecencio Mc, MD   REQUESTING/REFERRING PHYSICIAN: Dr. Lisa Roca  CHIEF COMPLAINT:   Chief Complaint  Patient presents with  . Rectal Bleeding    HISTORY OF PRESENT ILLNESS:  Jimmy Jimenez  is a 81 y.o. male with a known history of hypertension, hyperlipidemia, osteoporosis, essential thrombocytosis, history of lung nodule who presents to the hospital with rectal bleeding. Patient says he developed 2 episodes of watery diarrhea last night. He did not have any abdominal pain nausea vomiting fevers or chills. He didn't woke up this morning and had an episode of large bloody bowel movement. He was a bit concerned and therefore came to the ER for further evaluation. Patient has had no further episodes while in the emergency room. He denies ever having rectal bleeding with this in the past. He did have a colonoscopy within the past 10 years which she says was within normal limits. He denies any chest pain, shortness of breath, fevers, chills or any other associated symptoms presently.  PAST MEDICAL HISTORY:   Past Medical History  Diagnosis Date  . Lung nodule 12/2004    found on CXR  . Essential thrombocytosis ([place])   . Hyperlipidemia   . Hypertension   . Osteoporosis   . Essential thrombocytosis ([place])     PAST SURGICAL HISTORY:   Past Surgical History  Procedure Laterality Date  . Nasal sinus surgery      SOCIAL HISTORY:   Social History  Substance Use Topics  . Smoking status: Never Smoker   . Smokeless tobacco: Never Used  . Alcohol Use: No    FAMILY HISTORY:   Family History  Problem Relation Age of Onset  . Cancer Brother     colon  . Cancer Mother     colon    DRUG ALLERGIES:   Allergies  Allergen Reactions  . Amlodipine     swelling  . Atenolol      swelling  . Erythromycin     Other reaction(s): Hallucination  . Furosemide     Dizziness   . Hctz [Hydrochlorothiazide]     hyponatremia  . Levofloxacin     Causes sleepiness  . Metoprolol     dizziness  . Micardis [Telmisartan]     Causes swelling  . Penicillins Itching    Has patient had a PCN reaction causing immediate rash, facial/tongue/throat swelling, SOB or lightheadedness with hypotension: No Has patient had a PCN reaction causing severe rash involving mucus membranes or skin necrosis: No Has patient had a PCN reaction that required hospitalization No Has patient had a PCN reaction occurring within the last 10 years: No If all of the above answers are "NO", then may proceed with Cephalosporin use.  . Terazosin     Causes swelling in legs and feet  . Triamterene-Hctz     Patient stated is causes low sodium    REVIEW OF SYSTEMS:   Review of Systems  Constitutional: Negative for fever and weight loss.  HENT: Negative for congestion, nosebleeds and tinnitus.   Eyes: Negative for blurred vision, double vision and redness.  Respiratory: Negative for cough, hemoptysis and shortness of breath.   Cardiovascular: Negative for chest pain, orthopnea, leg swelling and PND.  Gastrointestinal: Positive for diarrhea and blood in stool. Negative for nausea, vomiting,  abdominal pain and melena.  Genitourinary: Negative for dysuria, urgency and hematuria.  Musculoskeletal: Negative for joint pain and falls.  Neurological: Negative for dizziness, tingling, sensory change, focal weakness, seizures, weakness and headaches.  Endo/Heme/Allergies: Negative for polydipsia. Does not bruise/bleed easily.  Psychiatric/Behavioral: Negative for depression and memory loss. The patient is not nervous/anxious.     MEDICATIONS AT HOME:   Prior to Admission medications   Medication Sig Start Date End Date Taking? Authorizing Provider  alendronate (FOSAMAX) 70 MG tablet TAKE ONE TABLET BY MOUTH  ONCE A WEEK IN THE MORNING. TAKE WITH WATER 40 MINUTES PRIOR TO ANY FOOD AND REMAIN SITTING UP 10/07/14  Yes Crecencio Mc, MD  amLODipine (NORVASC) 5 MG tablet Take 1 tablet (5 mg total) by mouth daily. As needed for BP > 160 09/26/13  Yes Crecencio Mc, MD  aspirin EC 81 MG tablet Take 81 mg by mouth daily.   Yes Historical Provider, MD  Calcium Citrate-Vitamin D (CITRACAL + D PO) Take 1 tablet by mouth 2 (two) times daily. Take 2 by mouth daily, calcium 630 and vitamin D 500 each.   Yes Historical Provider, MD  Cholecalciferol (VITAMIN D3) 1000 UNITS CAPS Take 1,000 Units by mouth 2 (two) times daily. Take 2 by mouth daily   Yes Historical Provider, MD  hydrALAZINE (APRESOLINE) 50 MG tablet TAKE ONE TABLET BY MOUTH THREE TIMES DAILY 07/15/14  Yes Crecencio Mc, MD  hydroxyurea (HYDREA) 500 MG capsule Take 500 mg by mouth daily. May take with food to minimize GI side effects.   Yes Historical Provider, MD  lisinopril (PRINIVIL,ZESTRIL) 40 MG tablet TAKE ONE TABLET BY MOUTH ONCE DAILY 07/15/14  Yes Crecencio Mc, MD  alendronate (FOSAMAX) 70 MG tablet Take 1 tablet (70 mg total) by mouth every 7 (seven) days. Take with a full glass of water on an empty stomach. 07/01/11   Crecencio Mc, MD  alendronate (FOSAMAX) 70 MG tablet TAKE ONE TABLET BY MOUTH ONCE A WEEK IN THE MORNING. TAKE WITH WATER 40 MINUTES PRIOR TO ANY FOOD AND REMAIN SITTING UP 10/03/14   Crecencio Mc, MD  mirtazapine (REMERON) 7.5 MG tablet Take 1 tablet (7.5 mg total) by mouth at bedtime. 08/29/14   Crecencio Mc, MD      VITAL SIGNS:  Blood pressure 156/61, pulse 63, temperature 98.5 F (36.9 C), temperature source Oral, resp. rate 20, height 5\' 10"  (1.778 m), weight 72.576 kg (160 lb), SpO2 97 %.  PHYSICAL EXAMINATION:  Physical Exam  GENERAL:  81 y.o.-year-old patient lying in the bed with no acute distress.  EYES: Pupils equal, round, reactive to light and accommodation. No scleral icterus. Extraocular muscles intact.   HEENT: Head atraumatic, normocephalic. Oropharynx and nasopharynx clear. No oropharyngeal erythema, moist oral mucosa  NECK:  Supple, no jugular venous distention. No thyroid enlargement, no tenderness.  LUNGS: Normal breath sounds bilaterally, no wheezing, rales, rhonchi. No use of accessory muscles of respiration.  CARDIOVASCULAR: S1, S2 RRR. No murmurs, rubs, gallops, clicks.  ABDOMEN: Soft, nontender, nondistended. Bowel sounds present. No organomegaly or mass.  EXTREMITIES: No pedal edema, cyanosis, or clubbing. + 2 pedal & radial pulses b/l.   NEUROLOGIC: Cranial nerves II through XII are intact. No focal Motor or sensory deficits appreciated b/l PSYCHIATRIC: The patient is alert and oriented x 3. Good affect.  SKIN: No obvious rash, lesion, or ulcer.   LABORATORY PANEL:   CBC  Recent Labs Lab 12/07/14 0741  WBC 13.2*  HGB 14.3  HCT 43.2  PLT 356   ------------------------------------------------------------------------------------------------------------------  Chemistries   Recent Labs Lab 12/07/14 0741  NA 134*  K 4.3  CL 97*  CO2 28  GLUCOSE 126*  BUN 12  CREATININE 0.97  CALCIUM 9.5  AST 26  ALT 13*  ALKPHOS 68  BILITOT 1.0   ------------------------------------------------------------------------------------------------------------------  Cardiac Enzymes  Recent Labs Lab 12/07/14 0741  TROPONINI <0.03   ------------------------------------------------------------------------------------------------------------------  RADIOLOGY:  No results found.   IMPRESSION AND PLAN:   81 year old male with past medical history of hypertension, hyperlipidemia, essential thrombocytosis, history of lung nodule, osteoporosis who presents to the hospital due to rectal bleeding.  #1 GI bleed-this is likely a lower GI bleed given the patient's rectal bleeding. I suspect this is likely either diverticular or related to hemorrhoidal bleeding. Patient's hemoglobin  is stable. He denies any abdominal pain, nausea, vomiting. -I will observe him and follow serial hemoglobin. Hold aspirin. -If he continues to have significant bleeding will consult gastroenterology and get a bleeding scan.  #2 hypertension-continue Norvasc, hydralazine.  #3 history of essential thrombocytosis-continue hydroxyurea.  #4 osteoporosis-continue Fosamax.   All the records are reviewed and case discussed with ED provider. Management plans discussed with the patient, family and they are in agreement.  CODE STATUS: Full  TOTAL TIME TAKING CARE OF THIS PATIENT: 45 minutes.    Henreitta Leber M.D on 12/07/2014 at 9:36 AM  Between 7am to 6pm - Pager - [phone]  After 6pm go to [email or web address] - password [email or web address] [facility]  Office  [phone]  CC: Primary care physician; Crecencio Mc, MD

## 2014-12-07 NOTE — ED Notes (Signed)
Pt states an episode of rectal bleeding this AM, states diaherra last night, denies use of blood thinners, denies any SOB, weakness or dizziness

## 2014-12-07 NOTE — ED Notes (Signed)
Reports having an episode of diarrhea last pm.  This am felt like he needed to go again but bright red blood came out.  Reports mild abd pain.  Skin w/d.

## 2014-12-07 NOTE — ED Provider Notes (Signed)
[facility] Emergency Department Provider Note   ____________________________________________  Time seen: 7:50 AM I have reviewed the triage vital signs and the triage nursing note.  HISTORY  Chief Complaint Rectal Bleeding   Historian Patient and wife  HPI Jimmy Jimenez is a 81 y.o. male who is here for evaluation of rectal bleeding. He had a large episode of watery diarrhea last night. This morning he had the urge to have a bowel movement and he had dark red clots come out and then bright red blood. No rectal pain.  Occasional mild abdominal cramping, but none currently. No history of GI bleeding. Patient does take a baby aspirin daily. No known hemorrhoids. History of colonoscopy, but "many" years ago. PCP is Dr. Derrel Nip.    Past Medical History  Diagnosis Date  . Lung nodule 12/2004    found on CXR  . Essential thrombocytosis ([place])   . Hyperlipidemia   . Hypertension   . Osteoporosis   . Essential thrombocytosis [facility])     Patient Active Problem List   Diagnosis Date Noted  . Loss of weight 08/20/2014  . Xerosis of skin 12/18/2013  . Medicare annual wellness visit, subsequent 09/28/2013  . Statin intolerance 09/26/2013  . Pulmonary hypertension, mild ([place]) 12/14/2012  . Rhinitis, nonallergic 12/14/2012  . Solitary pulmonary nodule 10/16/2012  . Heart failure, diastolic, due to HTN ([place]) 10/16/2012  . Bradycardia, sinus 09/02/2012  . Hyponatremia 12/01/2011  . Edema of both legs 10/30/2011  . Essential thrombocytosis ([place])   . Hyperlipidemia   . Hypertension   . Osteoporosis, unspecified     Past Surgical History  Procedure Laterality Date  . Nasal sinus surgery      Current Outpatient Rx  Name  Route  Sig  Dispense  Refill  . alendronate (FOSAMAX) 70 MG tablet      TAKE ONE TABLET BY MOUTH ONCE A WEEK IN THE MORNING. TAKE WITH WATER 40 MINUTES PRIOR TO ANY FOOD AND REMAIN SITTING UP   12 tablet   4   . amLODipine (NORVASC) 5 MG  tablet   Oral   Take 1 tablet (5 mg total) by mouth daily. As needed for BP > 160   30 tablet   3   . aspirin EC 81 MG tablet   Oral   Take 81 mg by mouth daily.         . Calcium Citrate-Vitamin D (CITRACAL + D PO)   Oral   Take 1 tablet by mouth 2 (two) times daily. Take 2 by mouth daily, calcium 630 and vitamin D 500 each.         . Cholecalciferol (VITAMIN D3) 1000 UNITS CAPS   Oral   Take 1,000 Units by mouth 2 (two) times daily. Take 2 by mouth daily         . hydrALAZINE (APRESOLINE) 50 MG tablet      TAKE ONE TABLET BY MOUTH THREE TIMES DAILY   90 tablet   5   . hydroxyurea (HYDREA) 500 MG capsule   Oral   Take 500 mg by mouth daily. May take with food to minimize GI side effects.         [name] lisinopril (PRINIVIL,ZESTRIL) 40 MG tablet      TAKE ONE TABLET BY MOUTH ONCE DAILY   30 tablet   5   . alendronate (FOSAMAX) 70 MG tablet   Oral   Take 1 tablet (70 mg total) by mouth every 7 (seven) days.  Take with a full glass of water on an empty stomach.   12 tablet   1   . alendronate (FOSAMAX) 70 MG tablet      TAKE ONE TABLET BY MOUTH ONCE A WEEK IN THE MORNING. TAKE WITH WATER 40 MINUTES PRIOR TO ANY FOOD AND REMAIN SITTING UP   12 tablet   0   . mirtazapine (REMERON) 7.5 MG tablet   Oral   Take 1 tablet (7.5 mg total) by mouth at bedtime.   30 tablet   3     Allergies Amlodipine; Atenolol; Erythromycin; Furosemide; Hctz; Levofloxacin; Metoprolol; Micardis; Penicillins; Terazosin; and Triamterene-hctz  Family History  Problem Relation Age of Onset  . Cancer Brother     colon  . Cancer Mother     colon    Social History Social History  Substance Use Topics  . Smoking status: Never Smoker   . Smokeless tobacco: Never Used  . Alcohol Use: No    Review of Systems  Constitutional: Negative for fever. Eyes: Negative for visual changes. ENT: Negative for sore throat. Cardiovascular: Negative for chest pain. Respiratory: Negative for  shortness of breath. Gastrointestinal: Negative for vomiting Genitourinary: Negative for dysuria. Musculoskeletal: Negative for back pain. Skin: Negative for rash. Neurological: Negative for headache. 10 point Review of Systems otherwise negative ____________________________________________   PHYSICAL EXAM:  VITAL SIGNS: ED Triage Vitals  Enc Vitals Group     BP 12/07/14 0714 176/72 mmHg     Pulse Rate 12/07/14 0714 95     Resp 12/07/14 0714 16     Temp 12/07/14 0714 98.5 F (36.9 C)     Temp Source 12/07/14 0714 Oral     SpO2 12/07/14 0714 94 %     Weight 12/07/14 0714 160 lb (72.576 kg)     Height 12/07/14 0714 5\' 10"  (1.778 m)     Head Cir --      Peak Flow --      Pain Score --      Pain Loc --      Pain Edu? --      Excl. in [place]? --      Constitutional: Alert and oriented. Well appearing and in no distress. Eyes: Conjunctivae are normal. PERRL. Normal extraocular movements. ENT   Head: Normocephalic and atraumatic.   Nose: No congestion/rhinnorhea.   Mouth/Throat: Mucous membranes are moist.   Neck: No stridor. Cardiovascular/Chest: Normal rate, regular rhythm.  No murmurs, rubs, or gallops. Respiratory: Normal respiratory effort without tachypnea nor retractions. Breath sounds are clear and equal bilaterally. No wheezes/rales/rhonchi. Gastrointestinal: Soft. No distention, no guarding, no rebound. Nontender.  Genitourinary/rectal: No external hemorrhoids. Nontender rectal exam. Brown flecks of stool, strongly heme positive secretions. No visible blood. Musculoskeletal: Nontender with normal range of motion in all extremities. No joint effusions.  No lower extremity tenderness.  No edema. Neurologic:  Normal speech and language. No gross or focal neurologic deficits are appreciated. Skin:  Skin is warm, dry and intact. No rash noted. Psychiatric: Mood and affect are normal. Speech and behavior are normal. Patient exhibits appropriate insight and  judgment.  ____________________________________________   EKG I, Lisa Roca, MD, the attending physician have personally viewed and interpreted all ECGs.  65 bpm. Normal sinus rhythm. Narrow QRS. Left axis deviation. Nonspecific T wave. ____________________________________________  LABS (pertinent positives/negatives)  Comprehensive metabolic panel significant for sodium 134, chloride 97, glucose 126 and otherwise within normal limits White blood cell count 13.2, hemoglobin 14.3 and platelet count 356 Troponin  less than 0.03  ____________________________________________  RADIOLOGY All Xrays were viewed by me. Imaging interpreted by Radiologist.  None __________________________________________  PROCEDURES  Procedure(s) performed: None  Critical Care performed: None  ____________________________________________   ED COURSE / ASSESSMENT AND PLAN  CONSULTATIONS: Hospitalist for admission.  Pertinent labs & imaging results that were available during my care of the patient were reviewed by me and considered in my medical decision making (see chart for details).   Patient's here with complaint of GI bleeding. Suspect lower GI bleed rather than upper GI bleed, however I will go ahead and start him Protonix in case of possible upper source. Patient's vital signs are stable. His hemoglobin initially is 14.3. Type and screen was sent. I discussed with the family need for observation to monitor bleeding and vital signs as well as hemoglobin.  Patient / Family / Caregiver informed of clinical course, medical decision-making process, and agree with plan.    ___________________________________________   FINAL CLINICAL IMPRESSION(S) / ED DIAGNOSES   Final diagnoses:  Rectal bleeding       Lisa Roca, MD 12/07/14 [phone]

## 2014-12-07 NOTE — Progress Notes (Signed)
CONCERNING: P&T Medication Policy Regarding Oral Bisphosphonates  RECOMMENDATION: Your order for alendronate (Fosamax), ibandronate (Boniva), or risedronate (Actonel) has been discontinued at this time.  If the patient's post-hospital medical condition warrants safe use of this class of drugs, please resume the pre-hospital regimen upon discharge.  DESCRIPTION:  Alendronate (Fosamax), ibandronate (Boniva), and risedronate (Actonel) can cause severe esophageal erosions in patients who are unable to remain upright at least 30 minutes after taking this medication.   Since brief interruptions in therapy are thought to have minimal impact on bone mineral density, the [place] has established that bisphosphonate orders should be routinely discontinued during hospitalization.   To override this safety policy and permit administration of Boniva, Fosamax, or Actonel in the hospital, prescribers must write "DO NOT HOLD" in the comments section when placing the order for this class of medications.  Rexene Edison, PharmD Clinical Pharmacist  12/07/2014 10:38 AM

## 2014-12-08 LAB — CBC
HEMATOCRIT: 37.2 % — AB (ref 40.0–52.0)
HEMOGLOBIN: 12.6 g/dL — AB (ref 13.0–18.0)
MCH: 37.1 pg — AB (ref 26.0–34.0)
MCHC: 33.8 g/dL (ref 32.0–36.0)
MCV: 109.7 fL — ABNORMAL HIGH (ref 80.0–100.0)
Platelets: 273 10*3/uL (ref 150–440)
RBC: 3.39 MIL/uL — ABNORMAL LOW (ref 4.40–5.90)
RDW: 13.3 % (ref 11.5–14.5)
WBC: 9.9 10*3/uL (ref 3.8–10.6)

## 2014-12-08 NOTE — Progress Notes (Signed)
MD order received in [facility] to discharge pt home today; verbally reviewed AVS with pt including medications/no new Rxs; diet; activity level and follow up appointment with Dr Derrel Nip for 1 week/pt to call Dr Lupita Dawn office on 12/09/14 to schedule; no questions voiced at this time; pt discharged via wheelchair by auxillary to the visitor's entrance

## 2014-12-08 NOTE — Discharge Summary (Signed)
[facility] [place] NAME: Jimmy Jimenez    MR#:  [id number]  DATE OF BIRTH:  03/13/1925  DATE OF ADMISSION:  12/07/2014 ADMITTING PHYSICIAN: Henreitta Leber, MD  DATE OF DISCHARGE: 12/08/2014 10:20 AM  PRIMARY CARE PHYSICIAN: Crecencio Mc, MD    ADMISSION DIAGNOSIS:  Rectal bleeding [K62.5]  DISCHARGE DIAGNOSIS:  Active Problems:   GI bleed   SECONDARY DIAGNOSIS:   Past Medical History  Diagnosis Date  . Lung nodule 12/2004    found on CXR  . Essential thrombocytosis ([place])   . Hyperlipidemia   . Hypertension   . Osteoporosis   . Essential thrombocytosis [facility])     HOSPITAL COURSE:   81 year old male with past medical history of hypertension, hyperlipidemia, essential thrombocytosis, history of lung nodule, osteoporosis who presents to the hospital due to rectal bleeding.  #1 GI bleed-this is likely a lower GI bleed given the patient's rectal bleeding. Patient was observed overnight in the hospital. He had no further evidence of rectal bleeding. I suspect this is likely a self-limited diverticular bleed. His hemoglobin remained stable. His diet was advanced and he tolerated it well without any further abdominal pain nausea vomiting and therefore discharged home.  #2 hypertension-continue Norvasc, hydralazine.  #3 history of essential thrombocytosis-continue hydroxyurea.  #4 osteoporosis-continue Fosamax.  DISCHARGE CONDITIONS:   Stable  CONSULTS OBTAINED:     DRUG ALLERGIES:   Allergies  Allergen Reactions  . Amlodipine     swelling  . Atenolol     swelling  . Erythromycin     Other reaction(s): Hallucination  . Furosemide     Dizziness   . Hctz [Hydrochlorothiazide]     hyponatremia  . Levofloxacin     Causes sleepiness  . Metoprolol     dizziness  . Micardis [Telmisartan]     Causes swelling  . Penicillins Itching    Has patient had a PCN reaction causing immediate rash,  facial/tongue/throat swelling, SOB or lightheadedness with hypotension: No Has patient had a PCN reaction causing severe rash involving mucus membranes or skin necrosis: No Has patient had a PCN reaction that required hospitalization No Has patient had a PCN reaction occurring within the last 10 years: No If all of the above answers are "NO", then may proceed with Cephalosporin use.  . Terazosin     Causes swelling in legs and feet  . Triamterene-Hctz     Patient stated is causes low sodium    DISCHARGE MEDICATIONS:   Discharge Medication List as of 12/08/2014 10:12 AM    CONTINUE these medications which have NOT CHANGED   Details  alendronate (FOSAMAX) 70 MG tablet TAKE ONE TABLET BY MOUTH ONCE A WEEK IN THE MORNING. TAKE WITH WATER 40 MINUTES PRIOR TO ANY FOOD AND REMAIN SITTING UP, Normal    amLODipine (NORVASC) 5 MG tablet Take 1 tablet (5 mg total) by mouth daily. As needed for BP > 160, Starting 09/26/2013, Until Discontinued, Normal    aspirin EC 81 MG tablet Take 81 mg by mouth daily., Until Discontinued, Historical Med    Calcium Citrate-Vitamin D (CITRACAL + D PO) Take 1 tablet by mouth 2 (two) times daily. Take 2 by mouth daily, calcium 630 and vitamin D 500 each., Until Discontinued, Historical Med    Cholecalciferol (VITAMIN D3) 1000 UNITS CAPS Take 1,000 Units by mouth 2 (two) times daily. Take 2 by mouth daily, Until Discontinued, Historical Med    hydrALAZINE (APRESOLINE) 50  MG tablet TAKE ONE TABLET BY MOUTH THREE TIMES DAILY, Normal    hydroxyurea (HYDREA) 500 MG capsule Take 500 mg by mouth daily. May take with food to minimize GI side effects., Until Discontinued, Historical Med    lisinopril (PRINIVIL,ZESTRIL) 40 MG tablet TAKE ONE TABLET BY MOUTH ONCE DAILY, Normal    mirtazapine (REMERON) 7.5 MG tablet Take 1 tablet (7.5 mg total) by mouth at bedtime., Starting 08/29/2014, Until Discontinued, Normal         DISCHARGE INSTRUCTIONS:   DIET:  Cardiac  diet  DISCHARGE CONDITION:  Stable  ACTIVITY:  Activity as tolerated  OXYGEN:  Home Oxygen: No.   Oxygen Delivery: room air  DISCHARGE LOCATION:  home   If you experience worsening of your admission symptoms, develop shortness of breath, life threatening emergency, suicidal or homicidal thoughts you must seek medical attention immediately by calling 911 or calling your MD immediately  if symptoms less severe.  You Must read complete instructions/literature along with all the possible adverse reactions/side effects for all the Medicines you take and that have been prescribed to you. Take any new Medicines after you have completely understood and accpet all the possible adverse reactions/side effects.   Please note  You were cared for by a hospitalist during your hospital stay. If you have any questions about your discharge medications or the care you received while you were in the hospital after you are discharged, you can call the unit and asked to speak with the hospitalist on call if the hospitalist that took care of you is not available. Once you are discharged, your primary care physician will handle any further medical issues. Please note that NO REFILLS for any discharge medications will be authorized once you are discharged, as it is imperative that you return to your primary care physician (or establish a relationship with a primary care physician if you do not have one) for your aftercare needs so that they can reassess your need for medications and monitor your lab values.     Today   No further bleeding overnight. Hemoglobin stable. No abdominal pain, nausea, vomiting.  VITAL SIGNS:  Blood pressure 136/56, pulse 62, temperature 98.2 F (36.8 C), temperature source Oral, resp. rate 18, height 5\' 10"  (1.778 m), weight 72.576 kg (160 lb), SpO2 97 %.  I/O:   Intake/Output Summary (Last 24 hours) at 12/08/14 1313 Last data filed at 12/08/14 0925  Gross per 24 hour   Intake   1253 ml  Output     55 ml  Net   1198 ml    PHYSICAL EXAMINATION:  GENERAL:  81 y.o.-year-old patient lying in the bed with no acute distress.  EYES: Pupils equal, round, reactive to light and accommodation. No scleral icterus. Extraocular muscles intact.  HEENT: Head atraumatic, normocephalic. Oropharynx and nasopharynx clear.  NECK:  Supple, no jugular venous distention. No thyroid enlargement, no tenderness.  LUNGS: Normal breath sounds bilaterally, no wheezing, rales,rhonchi. No use of accessory muscles of respiration.  CARDIOVASCULAR: S1, S2 normal. No murmurs, rubs, or gallops.  ABDOMEN: Soft, non-tender, non-distended. Bowel sounds present. No organomegaly or mass.  EXTREMITIES: No pedal edema, cyanosis, or clubbing.  NEUROLOGIC: Cranial nerves II through XII are intact. No focal motor or sensory defecits b/l.  PSYCHIATRIC: The patient is alert and oriented x 3. Good affect.  SKIN: No obvious rash, lesion, or ulcer.   DATA REVIEW:   CBC  Recent Labs Lab 12/08/14 0418  WBC 9.9  HGB 12.6*  HCT 37.2*  PLT 273    Chemistries   Recent Labs Lab 12/07/14 0741  NA 134*  K 4.3  CL 97*  CO2 28  GLUCOSE 126*  BUN 12  CREATININE 0.97  CALCIUM 9.5  AST 26  ALT 13*  ALKPHOS 68  BILITOT 1.0    Cardiac Enzymes  Recent Labs Lab 12/07/14 0741  TROPONINI <0.03    Microbiology Results  Results for orders placed or performed in visit on 08/29/14  Urine Culture     Status: None   Collection Time: 08/29/14 10:50 AM  Result Value Ref Range Status   Colony Count NO GROWTH  Final   Organism ID, Bacteria NO GROWTH  Final    RADIOLOGY:  No results found.    Management plans discussed with the patient, family and they are in agreement.  CODE STATUS:     Code Status Orders        Start     Ordered   12/07/14 1035  Full code   Continuous     12/07/14 1034      TOTAL TIME TAKING CARE OF THIS PATIENT: 40 minutes.    Henreitta Leber M.D on  12/08/2014 at 1:13 PM  Between 7am to 6pm - Pager - [phone]  After 6pm go to [email or web address] - password [email or web address] [facility]  Office  [phone]  CC: Primary care physician; Crecencio Mc, MD

## 2014-12-08 NOTE — Discharge Instructions (Signed)

## 2014-12-09 ENCOUNTER — Telehealth: Payer: Self-pay

## 2014-12-09 ENCOUNTER — Telehealth: Payer: Self-pay | Admitting: Internal Medicine

## 2014-12-09 NOTE — Telephone Encounter (Signed)
Pt needs an appt for hospital follow up for rectal bleeding was discharged on Saturday..please advise where to schedule.[name]

## 2014-12-09 NOTE — Telephone Encounter (Signed)
TCM call made and appointment scheduled for 12/12/14.

## 2014-12-09 NOTE — Telephone Encounter (Signed)
Transition Care Management Follow-up Telephone Call   Date discharged? 12/08/14   How have you been since you were released from the hospital? Doing okay. No bleeding at all.    Do you understand why you were in the hospital? Yes   Do you understand the discharge instructions? Yes   Where were you discharged to? Home   Items Reviewed:  Medications reviewed: Yes  Allergies reviewed: Yes  Dietary changes reviewed: Yes  Referrals reviewed: Yes   Functional Questionnaire:   Activities of Daily Living (ADLs):   He states they are independent in the following: All ADLs States they require assistance with the following: No assistance required at this time.   Any transportation issues/concerns?: No   Any patient concerns? No   Confirmed importance and date/time of follow-up visits scheduled Yes. Appointment to be booked with PCP.  Will call patient back with availability.  Provider Appointment to be booked with Dr. Derrel Nip (PCP)  Confirmed with patient if condition begins to worsen call PCP or go to the ER.  Patient was given the office number and encouraged to call back with question or concerns.  : Yes, patient verbalized understanding.

## 2014-12-09 NOTE — Telephone Encounter (Signed)
FYI

## 2014-12-09 NOTE — Telephone Encounter (Signed)
Will follow up with TCM call and schedule accordingly. 

## 2014-12-12 ENCOUNTER — Ambulatory Visit (INDEPENDENT_AMBULATORY_CARE_PROVIDER_SITE_OTHER): Payer: PPO | Admitting: Internal Medicine

## 2014-12-12 ENCOUNTER — Encounter: Payer: Self-pay | Admitting: Internal Medicine

## 2014-12-12 ENCOUNTER — Encounter: Payer: Self-pay | Admitting: *Deleted

## 2014-12-12 VITALS — BP 158/62 | HR 72 | Temp 97.6°F | Resp 12 | Ht 70.0 in | Wt 165.5 lb

## 2014-12-12 DIAGNOSIS — K521 Toxic gastroenteritis and colitis: Secondary | ICD-10-CM

## 2014-12-12 DIAGNOSIS — K625 Hemorrhage of anus and rectum: Secondary | ICD-10-CM | POA: Diagnosis not present

## 2014-12-12 DIAGNOSIS — T3695XA Adverse effect of unspecified systemic antibiotic, initial encounter: Secondary | ICD-10-CM

## 2014-12-12 DIAGNOSIS — J8410 Pulmonary fibrosis, unspecified: Secondary | ICD-10-CM | POA: Diagnosis not present

## 2014-12-12 DIAGNOSIS — M810 Age-related osteoporosis without current pathological fracture: Secondary | ICD-10-CM

## 2014-12-12 DIAGNOSIS — K529 Noninfective gastroenteritis and colitis, unspecified: Secondary | ICD-10-CM

## 2014-12-12 DIAGNOSIS — K5900 Constipation, unspecified: Secondary | ICD-10-CM

## 2014-12-12 DIAGNOSIS — E871 Hypo-osmolality and hyponatremia: Secondary | ICD-10-CM

## 2014-12-12 LAB — CBC WITH DIFFERENTIAL/PLATELET
BASOS ABS: 0.1 10*3/uL (ref 0.0–0.1)
Basophils Relative: 1.7 % (ref 0.0–3.0)
EOS PCT: 2.2 % (ref 0.0–5.0)
Eosinophils Absolute: 0.2 10*3/uL (ref 0.0–0.7)
HCT: 40.9 % (ref 39.0–52.0)
Hemoglobin: 13.5 g/dL (ref 13.0–17.0)
LYMPHS ABS: 1.5 10*3/uL (ref 0.7–4.0)
Lymphocytes Relative: 18.6 % (ref 12.0–46.0)
MCHC: 33 g/dL (ref 30.0–36.0)
MONOS PCT: 10.3 % (ref 3.0–12.0)
Monocytes Absolute: 0.8 10*3/uL (ref 0.1–1.0)
NEUTROS ABS: 5.5 10*3/uL (ref 1.4–7.7)
NEUTROS PCT: 67.2 % (ref 43.0–77.0)
PLATELETS: 385 10*3/uL (ref 150.0–400.0)
RBC: 3.65 Mil/uL — ABNORMAL LOW (ref 4.22–5.81)
RDW: 13.6 % (ref 11.5–15.5)
WBC: 8.2 10*3/uL (ref 4.0–10.5)

## 2014-12-12 LAB — BASIC METABOLIC PANEL
BUN: 12 mg/dL (ref 6–23)
CHLORIDE: 97 meq/L (ref 96–112)
CO2: 32 meq/L (ref 19–32)
Calcium: 9.5 mg/dL (ref 8.4–10.5)
Creatinine, Ser: 1 mg/dL (ref 0.40–1.50)
GFR: 74.99 mL/min (ref >60.00)
GLUCOSE: 98 mg/dL (ref 70–99)
POTASSIUM: 4.7 meq/L (ref 3.5–5.1)
SODIUM: 136 meq/L (ref 135–145)

## 2014-12-12 LAB — MAGNESIUM: Magnesium: 2.1 mg/dL (ref 1.5–2.5)

## 2014-12-12 LAB — TSH: TSH: 1.42 u[IU]/mL (ref 0.35–4.50)

## 2014-12-12 MED ORDER — LACTULOSE 20 GM/30ML PO SOLN: ORAL | Status: DC

## 2014-12-12 NOTE — Patient Instructions (Signed)
Please start using the lactulose today to relieve your constipation..    You can resume Miralax starting tomorrow,  Up to twice daily  Referral to Dr Shirlyn Goltz is in process   Please take a probiotic ( Align, [place] or [place]), or  the generic version of one of these  For a minimum of 3 weeks to whenever you are put on an antibiotics, to prevent a serious antibiotic associated diarrhea  Called clostridium dificile colitis

## 2014-12-12 NOTE — Progress Notes (Signed)
Subjective:  Patient ID: Jimmy Jimenez, male    DOB: 03/13/1925  Age: 81 y.o. MRN: [id number]  CC: The primary encounter diagnosis was Pulmonary fibrosis ([place]). Diagnoses of Rectal bleeding, Constipation, unspecified constipation type, Osteoporosis, Age-related osteoporosis without current pathological fracture, Hyponatremia, and Antibiotic-associated diarrhea were also pertinent to this visit.  HPI Jimmy Jimenez presents for hospital follow up . pateint was admitted to [facility] on Oct 8 and discharged in Oct 9th to home.  The admitting diagnosis was rectal bleed and the dc diagnosis was GI bleed.   History of present illness:  2 weeks PTA was treated for an URI with doxycycline starting on Sept 13 by Urgent care  for 10 days with no probiotic added.  Per records  He had been having a dry cough, and scratchy throat without fevers or dyspnea for nearly a month.  He finished the antibiotics  But developed diarrhea several days PTA and the morning of admission passed a large amount of blood .  He was [facility] to [facility] on 10/8 and  observed overnight.   No bleeding overnight,  hgb was stable and he was discharged home. With diagnosis of self limiting diverticular bleed. He was not seen by GI or Gen Surg.   He reports another episode of passing BRBPR  after he was discharged home but states that he has had no stool in 6 days.   He denies nausea and his appetite fine.    Lab Results  Component Value Date   WBC 8.2 12/12/2014   HGB 13.5 12/12/2014   HCT 40.9 12/12/2014   MCV 111.9 Repeated and verified X2.* 12/12/2014   PLT 385.0 12/12/2014     Outpatient Prescriptions Prior to Visit  Medication Sig Dispense Refill  . alendronate (FOSAMAX) 70 MG tablet TAKE ONE TABLET BY MOUTH ONCE A WEEK IN THE MORNING. TAKE WITH WATER 40 MINUTES PRIOR TO ANY FOOD AND REMAIN SITTING UP 12 tablet 4  . amLODipine (NORVASC) 5 MG tablet Take 1 tablet (5 mg total) by mouth daily. As needed for BP > 160 30 tablet 3  .  Calcium Citrate-Vitamin D (CITRACAL + D PO) Take 1 tablet by mouth 2 (two) times daily. Take 2 by mouth daily, calcium 630 and vitamin D 500 each.    . Cholecalciferol (VITAMIN D3) 1000 UNITS CAPS Take 1,000 Units by mouth 2 (two) times daily. Take 2 by mouth daily    . hydrALAZINE (APRESOLINE) 50 MG tablet TAKE ONE TABLET BY MOUTH THREE TIMES DAILY 90 tablet 5  . hydroxyurea (HYDREA) 500 MG capsule Take 500 mg by mouth daily. May take with food to minimize GI side effects.    [name] lisinopril (PRINIVIL,ZESTRIL) 40 MG tablet TAKE ONE TABLET BY MOUTH ONCE DAILY 30 tablet 5  . aspirin EC 81 MG tablet Take 81 mg by mouth daily.    . mirtazapine (REMERON) 7.5 MG tablet Take 1 tablet (7.5 mg total) by mouth at bedtime. (Patient not taking: Reported on 12/12/2014) 30 tablet 3   No facility-administered medications prior to visit.    Review of Systems;  Patient denies headache, fevers, malaise, unintentional weight loss, skin rash, eye pain, sinus congestion and sinus pain, sore throat, dysphagia,  hemoptysis , cough, dyspnea, wheezing, chest pain, palpitations, orthopnea, edema, abdominal pain, nausea, melena, diarrhea, constipation, flank pain, dysuria, hematuria, urinary  Frequency, nocturia, numbness, tingling, seizures,  Focal weakness, Loss of consciousness,  Tremor, insomnia, depression, anxiety, and suicidal ideation.      Objective:  BP 158/62 mmHg  Pulse 72  Temp(Src) 97.6 F (36.4 C) (Oral)  Resp 12  Ht 5\' 10"  (1.778 m)  Wt 165 lb 8 oz (75.07 kg)  BMI 23.75 kg/m2  SpO2 97%  BP Readings from Last 3 Encounters:  12/12/14 158/62  12/08/14 136/56  09/26/14 144/60    Wt Readings from Last 3 Encounters:  12/12/14 165 lb 8 oz (75.07 kg)  12/07/14 160 lb (72.576 kg)  09/26/14 165 lb (74.844 kg)    General appearance: alert, cooperative and appears stated age Ears: normal TM's and external ear canals both ears Throat: lips, mucosa, and tongue normal; teeth and gums normal Neck: no  adenopathy, no carotid bruit, supple, symmetrical, trachea midline and thyroid not enlarged, symmetric, no tenderness/mass/nodules Back: symmetric, no curvature. ROM normal. No CVA tenderness. Lungs: clear to auscultation bilaterally Heart: regular rate and rhythm, S1, S2 normal, no murmur, click, rub or gallop Abdomen: soft, non-tender; bowel sounds normal; no masses,  no organomegaly Pulses: 2+ and symmetric Skin: Skin color, texture, turgor normal. No rashes or lesions Lymph nodes: Cervical, supraclavicular, and axillary nodes normal.  No results found for: HGBA1C  Lab Results  Component Value Date   CREATININE 1.00 12/12/2014   CREATININE 0.97 12/07/2014   CREATININE 0.95 08/19/2014    Lab Results  Component Value Date   WBC 8.2 12/12/2014   HGB 13.5 12/12/2014   HCT 40.9 12/12/2014   PLT 385.0 12/12/2014   GLUCOSE 98 12/12/2014   LDLDIRECT 103.7 09/26/2013   ALT 13* 12/07/2014   AST 26 12/07/2014   NA 136 12/12/2014   K 4.7 12/12/2014   CL 97 12/12/2014   CREATININE 1.00 12/12/2014   BUN 12 12/12/2014   CO2 32 12/12/2014   TSH 1.42 12/12/2014   PSA 0.88 08/19/2014   MICROALBUR 0.7 08/30/2012    No results found.  Assessment & Plan:   Problem List Items Addressed This Visit    Osteoporosis    Managed with weekly alendronate.  He has not had a DXA scan in over 4 years . Normal vitamin D level in June       Hyponatremia    Recurrent, Secondary to use of hctz, . Currently resolved with drink gatorade daily   Lab Results  Component Value Date   NA 136 12/12/2014   K 4.7 12/12/2014   CL 97 12/12/2014   CO2 32 12/12/2014           Rectal bleeding    Presumed secondary to diverticular bleed; however there  Is no available colonoscopy report from his 2007  colonoscopy  By Dr Allen Norris to confirm diverticulosis.  I am prescribing lactulose for constipation  And referring to [facility] for anuscopy and further evaluation.  Hg has not dropped  CBC Latest  Ref Rng 12/12/2014 12/08/2014 12/07/2014  WBC 4.0 - 10.5 K/uL 8.2 9.9 -  Hemoglobin 13.0 - 17.0 g/dL 13.5 12.6(L) 13.2  Hematocrit 39.0 - 52.0 % 40.9 37.2(L) -  Platelets 150.0 - 400.0 K/uL 385.0 273 -         Relevant Orders   CBC with Differential/Platelet (Completed)   Ambulatory referral to General Surgery   Antibiotic-associated diarrhea    Now resolved,  Daily use of Probiotics for  3 weeks advised to reduce risk of C dificile colitis.       Pulmonary fibrosis ([place]) - Primary    Other Visit Diagnoses    Constipation, unspecified constipation type  Relevant Orders    TSH (Completed)    Basic metabolic panel (Completed)    Magnesium (Completed)    Age-related osteoporosis without current pathological fracture        Relevant Orders    DG Bone Density       I am having Jimmy Jimenez start on Lactulose. I am also having him maintain his Calcium Citrate-Vitamin D (CITRACAL + D PO), Vitamin D3, hydroxyurea, amLODipine, lisinopril, hydrALAZINE, mirtazapine, alendronate, and aspirin EC.  Meds ordered this encounter  Medications  . Lactulose 20 GM/30ML SOLN    Sig: 30 ml every 4 hours until constipation is relieved    Dispense:  236 mL    Refill:  3    There are no discontinued medications.  Follow-up: No Follow-up on file.   Crecencio Mc, MD

## 2014-12-12 NOTE — Progress Notes (Signed)
Pre-visit discussion using our clinic review tool. No additional management support is needed unless otherwise documented below in the visit note.  

## 2014-12-15 DIAGNOSIS — T3695XA Adverse effect of unspecified systemic antibiotic, initial encounter: Secondary | ICD-10-CM

## 2014-12-15 DIAGNOSIS — K521 Toxic gastroenteritis and colitis: Secondary | ICD-10-CM | POA: Insufficient documentation

## 2014-12-15 NOTE — Assessment & Plan Note (Signed)
Now resolved,  Daily use of Probiotics for  3 weeks advised to reduce risk of C dificile colitis.

## 2014-12-15 NOTE — Assessment & Plan Note (Addendum)
Presumed secondary to diverticular bleed; however there  Is no available colonoscopy report from his 2007  colonoscopy  By Dr Allen Norris to confirm diverticulosis.  I am prescribing lactulose for constipation  And referring to [facility] for anuscopy and further evaluation.  Hg has not dropped  CBC Latest Ref Rng 12/12/2014 12/08/2014 12/07/2014  WBC 4.0 - 10.5 K/uL 8.2 9.9 -  Hemoglobin 13.0 - 17.0 g/dL 13.5 12.6(L) 13.2  Hematocrit 39.0 - 52.0 % 40.9 37.2(L) -  Platelets 150.0 - 400.0 K/uL 385.0 273 -

## 2014-12-15 NOTE — Assessment & Plan Note (Signed)
Recurrent, Secondary to use of hctz, . Currently resolved with drink gatorade daily   Lab Results  Component Value Date   NA 136 12/12/2014   K 4.7 12/12/2014   CL 97 12/12/2014   CO2 32 12/12/2014

## 2014-12-15 NOTE — Assessment & Plan Note (Addendum)
Managed with weekly alendronate.  He has not had a DXA scan in over 4 years . Normal vitamin D level in June

## 2015-01-09 ENCOUNTER — Encounter: Payer: Self-pay | Admitting: General Surgery

## 2015-01-09 ENCOUNTER — Ambulatory Visit (INDEPENDENT_AMBULATORY_CARE_PROVIDER_SITE_OTHER): Payer: PPO | Admitting: General Surgery

## 2015-01-09 ENCOUNTER — Telehealth: Payer: Self-pay | Admitting: Internal Medicine

## 2015-01-09 VITALS — BP 122/64 | HR 78 | Resp 14 | Ht 70.0 in | Wt 164.0 lb

## 2015-01-09 DIAGNOSIS — K625 Hemorrhage of anus and rectum: Secondary | ICD-10-CM | POA: Diagnosis not present

## 2015-01-09 LAB — POC HEMOCCULT BLD/STL (OFFICE/1-CARD/DIAGNOSTIC): Fecal Occult Blood, POC: NEGATIVE

## 2015-01-09 NOTE — Progress Notes (Signed)
Patient ID: Jimmy Jimenez, male   DOB: 03/13/1925, 81 y.o.   MRN: [id number]  Chief Complaint  Patient presents with  . Rectal Bleeding    HPI Jimmy Jimenez is a 81 y.o. male here today for evaluation of a single episode of rectal bleeding. The blood was in the toilet (bright red). He first noticed this on December 06 2004. He states he has not had an episode of blood in his stool since that event.  He states he does have constipation occasionally, he is taking miralax which has helped.   Bowels move a least three times a week. Denies abdomen  pain. Denies nausea or vomiting.  The patient did not appreciate any dizziness or lightheadedness during the event.     HPI  Past Medical History  Diagnosis Date  . Lung nodule 12/2004    found on CXR  . Essential thrombocytosis ([place])   . Hyperlipidemia   . Hypertension   . Osteoporosis   . Essential thrombocytosis [facility])     Past Surgical History  Procedure Laterality Date  . Nasal sinus surgery    . Colonoscopy  2007, 2010    Dr Allen Norris    Family History  Problem Relation Age of Onset  . Cancer Brother     colon age 38's  . Cancer Mother     colon age 79's  . Peripheral vascular disease Father     Social History Social History  Substance Use Topics  . Smoking status: Never Smoker   . Smokeless tobacco: Never Used  . Alcohol Use: No    Allergies  Allergen Reactions  . Amlodipine     swelling  . Atenolol     swelling  . Erythromycin     Other reaction(s): Hallucination  . Furosemide     Dizziness   . Hctz [Hydrochlorothiazide]     hyponatremia  . Levofloxacin     Causes sleepiness  . Metoprolol     dizziness  . Micardis [Telmisartan]     Causes swelling  . Penicillins Itching    Has patient had a PCN reaction causing immediate rash, facial/tongue/throat swelling, SOB or lightheadedness with hypotension: No Has patient had a PCN reaction causing severe rash involving mucus membranes or skin necrosis: No Has  patient had a PCN reaction that required hospitalization No Has patient had a PCN reaction occurring within the last 10 years: No If all of the above answers are "NO", then may proceed with Cephalosporin use.  . Terazosin     Causes swelling in legs and feet  . Triamterene-Hctz     Patient stated is causes low sodium    Current Outpatient Prescriptions  Medication Sig Dispense Refill  . alendronate (FOSAMAX) 70 MG tablet TAKE ONE TABLET BY MOUTH ONCE A WEEK IN THE MORNING. TAKE WITH WATER 40 MINUTES PRIOR TO ANY FOOD AND REMAIN SITTING UP 12 tablet 4  . amLODipine (NORVASC) 5 MG tablet Take 1 tablet (5 mg total) by mouth daily. As needed for BP > 160 30 tablet 3  . aspirin EC 81 MG tablet Take 81 mg by mouth daily.    . Calcium Citrate-Vitamin D (CITRACAL + D PO) Take 1 tablet by mouth 2 (two) times daily. Take 2 by mouth daily, calcium 630 and vitamin D 500 each.    . Cholecalciferol (VITAMIN D3) 1000 UNITS CAPS Take 1,000 Units by mouth 2 (two) times daily. Take 2 by mouth daily    . hydrALAZINE (APRESOLINE) 50 MG tablet  TAKE ONE TABLET BY MOUTH THREE TIMES DAILY 90 tablet 5  . hydroxyurea (HYDREA) 500 MG capsule Take 500 mg by mouth daily. May take with food to minimize GI side effects.    [name] lisinopril (PRINIVIL,ZESTRIL) 40 MG tablet TAKE ONE TABLET BY MOUTH ONCE DAILY 30 tablet 5  . mirtazapine (REMERON) 7.5 MG tablet Take 1 tablet (7.5 mg total) by mouth at bedtime. 30 tablet 3  . polyethylene glycol (MIRALAX / GLYCOLAX) packet Take 17 g by mouth daily as needed.      No current facility-administered medications for this visit.    Review of Systems Review of Systems  Constitutional: Negative.   Respiratory: Negative.   Cardiovascular: Negative.   Gastrointestinal: Positive for constipation. Negative for nausea, vomiting and diarrhea.    Blood pressure 122/64, pulse 78, resp. rate 14, height 5\' 10"  (1.778 m), weight 164 lb (74.39 kg).  Physical Exam Physical Exam   Constitutional: He is oriented to person, place, and time. He appears well-developed and well-nourished.  HENT:  Mouth/Throat: Oropharynx is clear and moist.  Eyes: Conjunctivae are normal. No scleral icterus.  Neck: Neck supple.  Cardiovascular: Normal rate, regular rhythm and normal heart sounds.   Pulmonary/Chest: Effort normal and breath sounds normal.  Abdominal: Soft. Bowel sounds are normal. There is no tenderness.  Genitourinary: Prostate normal. Rectal exam shows no mass, no tenderness and anal tone normal. Guaiac negative stool.  Lymphadenopathy:    He has no cervical adenopathy.  Neurological: He is alert and oriented to person, place, and time.  Skin: Skin is dry.  Psychiatric: His behavior is normal.    Data Reviewed Hospitalization record reviewed.  Screening colonoscopy dated 02/16/2006 showed a few diverticuli.  Follow-up colonoscopy January 2010 for personal history of polyps showed a sessile polyp in the cecum. Pathology showed a hyperplastic polyp.  Assessment    Likely diverticular bleed, less likely internal hemorrhoid, resolved.    Plan    With a single episode of bright red rectal bleeding, negative stool Hemoccult today and a benign clinical exam I'm reluctant to put him through a formal colonoscopy unless further stool samples are positive for blood. He'll submit 6 slides for review, and if normal will not pursue any further investigation unless he has recurrent bleeding.  The patient was encouraged to call promptly if another episode of bleeding occurs.    Instructions for home stool sample guaiac cards.  PCP: Deborra Medina, MD  Robert Bellow 01/10/2015, 6:27 AM

## 2015-01-09 NOTE — Patient Instructions (Addendum)
The patient is aware to call back for any questions or concerns. Stool sample cards to be brought back in once completed.

## 2015-01-09 NOTE — Telephone Encounter (Signed)
Left msg to call office to schedule Medicare Annual Wellness Visit/msn

## 2015-01-21 ENCOUNTER — Ambulatory Visit (INDEPENDENT_AMBULATORY_CARE_PROVIDER_SITE_OTHER): Payer: PPO

## 2015-01-21 VITALS — BP 126/62 | HR 77 | Temp 98.0°F | Resp 14 | Ht 67.0 in | Wt 163.0 lb

## 2015-01-21 DIAGNOSIS — Z0000 Encounter for general adult medical examination without abnormal findings: Secondary | ICD-10-CM | POA: Diagnosis not present

## 2015-01-21 NOTE — Progress Notes (Signed)
  I have reviewed the above information and agree with above.   Daritza Brees, MD 

## 2015-01-21 NOTE — Patient Instructions (Addendum)
[name],   Thank you for taking time to come for your Medicare Wellness Visit.  I appreciate your ongoing commitment to your health goals. Please review the following plan we discussed and let me know if I can assist you in the future.  Follow up with PCP as needed   Fall Prevention in the Home  Falls can cause injuries. They can happen to people of all ages. There are many things you can do to make your home safe and to help prevent falls.  WHAT CAN I DO ON THE OUTSIDE OF MY HOME?  Regularly fix the edges of walkways and driveways and fix any cracks.  Remove anything that might make you trip as you walk through a door, such as a raised step or threshold.  Trim any bushes or trees on the path to your home.  Use bright outdoor lighting.  Clear any walking paths of anything that might make someone trip, such as rocks or tools.  Regularly check to see if handrails are loose or broken. Make sure that both sides of any steps have handrails.  Any raised decks and porches should have guardrails on the edges.  Have any leaves, snow, or ice cleared regularly.  Use sand or salt on walking paths during winter.  Clean up any spills in your garage right away. This includes oil or grease spills. WHAT CAN I DO IN THE BATHROOM?   Use night lights.  Install grab bars by the toilet and in the tub and shower. Do not use towel bars as grab bars.  Use non-skid mats or decals in the tub or shower.  If you need to sit down in the shower, use a plastic, non-slip stool.  Keep the floor dry. Clean up any water that spills on the floor as soon as it happens.  Remove soap buildup in the tub or shower regularly.  Attach bath mats securely with double-sided non-slip rug tape.  Do not have throw rugs and other things on the floor that can make you trip. WHAT CAN I DO IN THE BEDROOM?  Use night lights.  Make sure that you have a light by your bed that is easy to reach.  Do not use any sheets  or blankets that are too big for your bed. They should not hang down onto the floor.  Have a firm chair that has side arms. You can use this for support while you get dressed.  Do not have throw rugs and other things on the floor that can make you trip. WHAT CAN I DO IN THE KITCHEN?  Clean up any spills right away.  Avoid walking on wet floors.  Keep items that you use a lot in easy-to-reach places.  If you need to reach something above you, use a strong step stool that has a grab bar.  Keep electrical cords out of the way.  Do not use floor polish or wax that makes floors slippery. If you must use wax, use non-skid floor wax.  Do not have throw rugs and other things on the floor that can make you trip. WHAT CAN I DO WITH MY STAIRS?  Do not leave any items on the stairs.  Make sure that there are handrails on both sides of the stairs and use them. Fix handrails that are broken or loose. Make sure that handrails are as long as the stairways.  Check any carpeting to make sure that it is firmly attached to the stairs. Fix any  carpet that is loose or worn.  Avoid having throw rugs at the top or bottom of the stairs. If you do have throw rugs, attach them to the floor with carpet tape.  Make sure that you have a light switch at the top of the stairs and the bottom of the stairs. If you do not have them, ask someone to add them for you. WHAT ELSE CAN I DO TO HELP PREVENT FALLS?  Wear shoes that:  Do not have high heels.  Have rubber bottoms.  Are comfortable and fit you well.  Are closed at the toe. Do not wear sandals.  If you use a stepladder:  Make sure that it is fully opened. Do not climb a closed stepladder.  Make sure that both sides of the stepladder are locked into place.  Ask someone to hold it for you, if possible.  Clearly mark and make sure that you can see:  Any grab bars or handrails.  First and last steps.  Where the edge of each step is.  Use  tools that help you move around (mobility aids) if they are needed. These include:  Canes.  Walkers.  Scooters.  Crutches.  Turn on the lights when you go into a dark area. Replace any light bulbs as soon as they burn out.  Set up your furniture so you have a clear path. Avoid moving your furniture around.  If any of your floors are uneven, fix them.  If there are any pets around you, be aware of where they are.  Review your medicines with your doctor. Some medicines can make you feel dizzy. This can increase your chance of falling. Ask your doctor what other things that you can do to help prevent falls.   This information is not intended to replace advice given to you by your health care provider. Make sure you discuss any questions you have with your health care provider.   Document Released: 12/12/2008 Document Revised: 07/02/2014 Document Reviewed: 03/22/2014 Elsevier Interactive Patient Education 2016 Jimmy Jimenez Maintenance, Male A healthy lifestyle and preventative care can promote health and wellness.  Maintain regular health, dental, and eye exams.  Eat a healthy diet. Foods like vegetables, fruits, whole grains, low-fat dairy products, and lean protein foods contain the nutrients you need and are low in calories. Decrease your intake of foods high in solid fats, added sugars, and salt. Get information about a proper diet from your health care provider, if necessary.  Regular physical exercise is one of the most important things you can do for your health. Most adults should get at least 150 minutes of moderate-intensity exercise (any activity that increases your heart rate and causes you to sweat) each week. In addition, most adults need muscle-strengthening exercises on 2 or more days a week.   Maintain a healthy weight. The body mass index (BMI) is a screening tool to identify possible weight problems. It provides an estimate of body fat based on height and  weight. Your health care provider can find your BMI and can help you achieve or maintain a healthy weight. For males 20 years and older:  A BMI below 18.5 is considered underweight.  A BMI of 18.5 to 24.9 is normal.  A BMI of 25 to 29.9 is considered overweight.  A BMI of 30 and above is considered obese.  Maintain normal blood lipids and cholesterol by exercising and minimizing your intake of saturated fat. Eat a balanced diet with plenty of  fruits and vegetables. Blood tests for lipids and cholesterol should begin at age 60 and be repeated every 5 years. If your lipid or cholesterol levels are high, you are over age 23, or you are at high risk for heart disease, you may need your cholesterol levels checked more frequently.Ongoing high lipid and cholesterol levels should be treated with medicines if diet and exercise are not working.  If you smoke, find out from your health care provider how to quit. If you do not use tobacco, do not start.  Lung cancer screening is recommended for adults aged 40-80 years who are at high risk for developing lung cancer because of a history of smoking. A yearly low-dose CT scan of the lungs is recommended for people who have at least a 30-pack-year history of smoking and are current smokers or have quit within the past 15 years. A pack year of smoking is smoking an average of 1 pack of cigarettes a day for 1 year (for example, a 30-pack-year history of smoking could mean smoking 1 pack a day for 30 years or 2 packs a day for 15 years). Yearly screening should continue until the smoker has stopped smoking for at least 15 years. Yearly screening should be stopped for people who develop a health problem that would prevent them from having lung cancer treatment.  If you choose to drink alcohol, do not have more than 2 drinks per day. One drink is considered to be 12 oz (360 mL) of beer, 5 oz (150 mL) of wine, or 1.5 oz (45 mL) of liquor.  Avoid the use of street  drugs. Do not share needles with anyone. Ask for help if you need support or instructions about stopping the use of drugs.  High blood pressure causes heart disease and increases the risk of stroke. High blood pressure is more likely to develop in:  People who have blood pressure in the end of the normal range (100-139/85-89 mm Hg).  People who are overweight or obese.  People who are African American.  If you are 42-42 years of age, have your blood pressure checked every 3-5 years. If you are 7 years of age or older, have your blood pressure checked every year. You should have your blood pressure measured twice--once when you are at a hospital or clinic, and once when you are not at a hospital or clinic. Record the average of the two measurements. To check your blood pressure when you are not at a hospital or clinic, you can use:  An automated blood pressure machine at a pharmacy.  A home blood pressure monitor.  If you are 32-68 years old, ask your health care provider if you should take aspirin to prevent heart disease.  Diabetes screening involves taking a blood sample to check your fasting blood sugar level. This should be done once every 3 years after age 54 if you are at a normal weight and without risk factors for diabetes. Testing should be considered at a younger age or be carried out more frequently if you are overweight and have at least 1 risk factor for diabetes.  Colorectal cancer can be detected and often prevented. Most routine colorectal cancer screening begins at the age of 63 and continues through age 50. However, your health care provider may recommend screening at an earlier age if you have risk factors for colon cancer. On a yearly basis, your health care provider may provide home test kits to check for hidden blood in  the stool. A small camera at the end of a tube may be used to directly examine the colon (sigmoidoscopy or colonoscopy) to detect the earliest forms of  colorectal cancer. Talk to your health care provider about this at age 63 when routine screening begins. A direct exam of the colon should be repeated every 5-10 years through age 6, unless early forms of precancerous polyps or small growths are found.  People who are at an increased risk for hepatitis B should be screened for this virus. You are considered at high risk for hepatitis B if:  You were born in a country where hepatitis B occurs often. Talk with your health care provider about which countries are considered high risk.  Your parents were born in a high-risk country and you have not received a shot to protect against hepatitis B (hepatitis B vaccine).  You have HIV or AIDS.  You use needles to inject street drugs.  You live with, or have sex with, someone who has hepatitis B.  You are a man who has sex with other men (MSM).  You get hemodialysis treatment.  You take certain medicines for conditions like cancer, organ transplantation, and autoimmune conditions.  Hepatitis C blood testing is recommended for all people born from [birth year] through 1963 and any individual with known risk factors for hepatitis C.  Healthy men should no longer receive prostate-specific antigen (PSA) blood tests as part of routine cancer screening. Talk to your health care provider about prostate cancer screening.  Testicular cancer screening is not recommended for adolescents or adult males who have no symptoms. Screening includes self-exam, a health care provider exam, and other screening tests. Consult with your health care provider about any symptoms you have or any concerns you have about testicular cancer.  Practice safe sex. Use condoms and avoid high-risk sexual practices to reduce the spread of sexually transmitted infections (STIs).  You should be screened for STIs, including gonorrhea and chlamydia if:  You are sexually active and are younger than 24 years.  You are older than 24 years, and  your health care provider tells you that you are at risk for this type of infection.  Your sexual activity has changed since you were last screened, and you are at an increased risk for chlamydia or gonorrhea. Ask your health care provider if you are at risk.  If you are at risk of being infected with HIV, it is recommended that you take a prescription medicine daily to prevent HIV infection. This is called pre-exposure prophylaxis (PrEP). You are considered at risk if:  You are a man who has sex with other men (MSM).  You are a heterosexual man who is sexually active with multiple partners.  You take drugs by injection.  You are sexually active with a partner who has HIV.  Talk with your health care provider about whether you are at high risk of being infected with HIV. If you choose to begin PrEP, you should first be tested for HIV. You should then be tested every 3 months for as long as you are taking PrEP.  Use sunscreen. Apply sunscreen liberally and repeatedly throughout the day. You should seek shade when your shadow is shorter than you. Protect yourself by wearing long sleeves, pants, a wide-brimmed hat, and sunglasses year round whenever you are outdoors.  Tell your health care provider of new moles or changes in moles, especially if there is a change in shape or color. Also, tell your health  care provider if a mole is larger than the size of a pencil eraser.  A one-time screening for abdominal aortic aneurysm (AAA) and surgical repair of large AAAs by ultrasound is recommended for men aged 4-75 years who are current or former smokers.  Stay current with your vaccines (immunizations).   This information is not intended to replace advice given to you by your health care provider. Make sure you discuss any questions you have with your health care provider.   Document Released: 08/14/2007 Document Revised: 03/08/2014 Document Reviewed: 07/13/2010 Elsevier Interactive Patient  Education [employer].

## 2015-01-21 NOTE — Progress Notes (Signed)
Subjective:   Jimmy Jimenez is a 81 y.o. male who presents for Medicare Annual/Subsequent preventive examination.  Review of Systems:  No ROS.  Medicare Wellness Visit. Cardiac Risk Factors include: hypertension;male gender     Objective:    Vitals: BP 126/62 mmHg  Pulse 77  Temp(Src) 98 F (36.7 C) (Oral)  Resp 14  Ht 5\' 7"  (1.702 m)  Wt 163 lb (73.936 kg)  BMI 25.52 kg/m2  SpO2 95%  Tobacco History  Smoking status  . Never Smoker   Smokeless tobacco  . Never Used     Counseling given: Not Answered   Past Medical History  Diagnosis Date  . Lung nodule 12/2004    found on CXR  . Essential thrombocytosis ([place])   . Hyperlipidemia   . Hypertension   . Osteoporosis   . Essential thrombocytosis [facility])    Past Surgical History  Procedure Laterality Date  . Nasal sinus surgery    . Colonoscopy  2007, 2010    Dr Allen Norris   Family History  Problem Relation Age of Onset  . Cancer Brother     colon age 51's  . Cancer Mother     colon age 41's  . Peripheral vascular disease Father    History  Sexual Activity  . Sexual Activity: Not Currently    Outpatient Encounter Prescriptions as of 01/21/2015  Medication Sig  . alendronate (FOSAMAX) 70 MG tablet TAKE ONE TABLET BY MOUTH ONCE A WEEK IN THE MORNING. TAKE WITH WATER 40 MINUTES PRIOR TO ANY FOOD AND REMAIN SITTING UP  . amLODipine (NORVASC) 5 MG tablet Take 1 tablet (5 mg total) by mouth daily. As needed for BP > 160  . aspirin EC 81 MG tablet Take 81 mg by mouth daily.  . Calcium Citrate-Vitamin D (CITRACAL + D PO) Take 1 tablet by mouth 2 (two) times daily. Take 2 by mouth daily, calcium 630 and vitamin D 500 each.  . Cholecalciferol (VITAMIN D3) 1000 UNITS CAPS Take 1,000 Units by mouth 2 (two) times daily. Take 2 by mouth daily  . hydrALAZINE (APRESOLINE) 50 MG tablet TAKE ONE TABLET BY MOUTH THREE TIMES DAILY  . hydroxyurea (HYDREA) 500 MG capsule Take 500 mg by mouth daily. May take with food to minimize GI  side effects.  [name] lisinopril (PRINIVIL,ZESTRIL) 40 MG tablet TAKE ONE TABLET BY MOUTH ONCE DAILY  . mirtazapine (REMERON) 7.5 MG tablet Take 1 tablet (7.5 mg total) by mouth at bedtime.  . polyethylene glycol (MIRALAX / GLYCOLAX) packet Take 17 g by mouth daily as needed.    No facility-administered encounter medications on file as of 01/21/2015.    Activities of Daily Living In your present state of health, do you have any difficulty performing the following activities: 01/21/2015 12/07/2014  Hearing? N Y  Vision? N N  Difficulty concentrating or making decisions? N N  Walking or climbing stairs? N N  Dressing or bathing? N N  Doing errands, shopping? N N  Preparing Food and eating ? N -  Using the Toilet? N -  In the past six months, have you accidently leaked urine? N -  Do you have problems with loss of bowel control? N -  Managing your Medications? N -  Managing your Finances? N -  Housekeeping or managing your Housekeeping? N -    Patient Care Team: Crecencio Mc, MD as PCP - General (Internal Medicine) Robert Bellow, MD as Consulting Physician (General Surgery) Crecencio Mc, MD  as Referring Physician (Internal Medicine) Leia Alf, MD as Medical Oncologist (Internal Medicine)   Assessment:   This is a routine wellness examination for [place]. The goal of the wellness visit is to assist the patient how to close the gaps in care and create a preventative care plan for the patient.   Bone Density/Risk for Osteoporosis discussed/taking Calcium VIT D as appropriate.  Taking meds without issues; no barriers identified.  ZOSTAVAX postponed, per patient request.  Safety issues reviewed; smoke detectors in the home. No firearms in the home. Wears seatbelts when driving or riding with others. No violence in the home.  The patient was oriented x 3; appropriate in dress and manner and no objective failures at ADL's or IADL's.   Chr pulmon heart dis NEC-stable and  followed by Dr Derrel Nip (PCP) Hyp ht dis NOS w ht fail -stable and followed by PCP Diastolc hrt failure NOS -stable and followed by PCP Hypertensive heart disease with heart failure -stable and followed by PCP Unspecified diastolic (congestive) heart failure -stable and followed by PCP Essential (hemorrhagic) thrombocythemia -stable and followed by Dr Trevor Iha  Patient Concerns: None at this time.  Follow up as needed.   Exercise Activities and Dietary recommendations Current Exercise Habits:: Home exercise routine, Type of exercise: walking, Time (Minutes): 30, Frequency (Times/Week): 1, Weekly Exercise (Minutes/Week): 30, Intensity: Mild  Goals    . Increase water intake     Currently drinks 1 cup of water daily.  Patient center goal is to increase water intake up to 2 cups daily      Fall Risk Fall Risk  01/21/2015 09/26/2013  Falls in the past year? No No   Depression Screen PHQ 2/9 Scores 01/21/2015 09/26/2013  PHQ - 2 Score 0 0    Cognitive Testing MMSE - Mini Mental State Exam 01/21/2015  Orientation to time 5  Orientation to Place 5  Registration 3  Attention/ Calculation 5  Recall 3  Language- name 2 objects 2  Language- repeat 1  Language- follow 3 step command 3  Language- read & follow direction 1  Write a sentence 1  Copy design 1  Total score 30    Immunization History  Administered Date(s) Administered  . Influenza Split 11/23/2011, 11/18/2013  . Influenza,inj,Quad PF,36+ Mos 11/15/2012, 12/08/2014  . Pneumococcal Conjugate-13 09/26/2013  . Pneumococcal Polysaccharide-23 11/11/2011  . Tdap 11/27/2012   Screening Tests Health Maintenance  Topic Date Due  . ZOSTAVAX  12/31/2015 (Originally [birth year]/1987)  . INFLUENZA VACCINE  09/30/2015  . TETANUS/TDAP  11/28/2022  . PNA vac Low Risk Adult  Completed      Plan:    End of life planning; Advance aging; Advanced directives was discussed. Plans to return a current copy of Living Will.  Follow up with  PCP as needed.  During the course of the visit the patient was educated and counseled about the following appropriate screening and preventive services:   Vaccines to include Pneumoccal, Influenza, Hepatitis B, Td, Zostavax, HCV  Electrocardiogram  Cardiovascular Disease  Colorectal cancer screening  Diabetes screening  Prostate Cancer Screening  Glaucoma screening  Nutrition counseling   Smoking cessation counseling  Patient Instructions (the written plan) was given to the patient.    Varney Biles, LPN  [id number]

## 2015-02-06 ENCOUNTER — Ambulatory Visit (INDEPENDENT_AMBULATORY_CARE_PROVIDER_SITE_OTHER): Payer: PPO | Admitting: *Deleted

## 2015-02-06 DIAGNOSIS — K625 Hemorrhage of anus and rectum: Secondary | ICD-10-CM

## 2015-02-06 LAB — POC HEMOCCULT BLD/STL (HOME/3-CARD/SCREEN)
Card #3 Fecal Occult Blood, POC: NEGATIVE
Card #3 Fecal Occult Blood, POC: NEGATIVE
FECAL OCCULT BLD: NEGATIVE
FECAL OCCULT BLD: NEGATIVE
FECAL OCCULT BLD: NEGATIVE
Fecal Occult Blood, POC: NEGATIVE

## 2015-02-06 NOTE — Progress Notes (Signed)
All stool cards negative. 

## 2015-02-07 ENCOUNTER — Other Ambulatory Visit: Payer: Self-pay | Admitting: Internal Medicine

## 2015-02-10 ENCOUNTER — Other Ambulatory Visit: Payer: Self-pay | Admitting: Internal Medicine

## 2015-03-02 DIAGNOSIS — J069 Acute upper respiratory infection, unspecified: Secondary | ICD-10-CM | POA: Diagnosis not present

## 2015-03-02 DIAGNOSIS — H6123 Impacted cerumen, bilateral: Secondary | ICD-10-CM | POA: Diagnosis not present

## 2015-03-05 ENCOUNTER — Other Ambulatory Visit: Payer: Self-pay | Admitting: Internal Medicine

## 2015-03-13 DIAGNOSIS — J069 Acute upper respiratory infection, unspecified: Secondary | ICD-10-CM | POA: Diagnosis not present

## 2015-03-13 DIAGNOSIS — R05 Cough: Secondary | ICD-10-CM | POA: Diagnosis not present

## 2015-03-17 ENCOUNTER — Inpatient Hospital Stay: Payer: PPO | Admitting: Internal Medicine

## 2015-03-17 ENCOUNTER — Other Ambulatory Visit: Payer: Self-pay | Admitting: *Deleted

## 2015-03-17 ENCOUNTER — Inpatient Hospital Stay: Payer: PPO

## 2015-03-17 ENCOUNTER — Encounter: Payer: Self-pay | Admitting: *Deleted

## 2015-03-17 DIAGNOSIS — D473 Essential (hemorrhagic) thrombocythemia: Secondary | ICD-10-CM

## 2015-03-18 ENCOUNTER — Encounter: Payer: Self-pay | Admitting: General Surgery

## 2015-03-24 ENCOUNTER — Ambulatory Visit (INDEPENDENT_AMBULATORY_CARE_PROVIDER_SITE_OTHER): Payer: PPO | Admitting: Internal Medicine

## 2015-03-24 ENCOUNTER — Encounter: Payer: Self-pay | Admitting: Internal Medicine

## 2015-03-24 VITALS — BP 144/84 | HR 78 | Temp 97.9°F | Ht 70.0 in

## 2015-03-24 DIAGNOSIS — J069 Acute upper respiratory infection, unspecified: Secondary | ICD-10-CM | POA: Diagnosis not present

## 2015-03-24 DIAGNOSIS — H6123 Impacted cerumen, bilateral: Secondary | ICD-10-CM

## 2015-03-24 DIAGNOSIS — H918X9 Other specified hearing loss, unspecified ear: Secondary | ICD-10-CM

## 2015-03-24 DIAGNOSIS — H6120 Impacted cerumen, unspecified ear: Secondary | ICD-10-CM | POA: Diagnosis not present

## 2015-03-24 DIAGNOSIS — B9789 Other viral agents as the cause of diseases classified elsewhere: Principal | ICD-10-CM

## 2015-03-24 MED ORDER — PREDNISONE 10 MG PO TABS: ORAL_TABLET | ORAL | Status: DC

## 2015-03-24 MED ORDER — BENZONATATE 100 MG PO CAPS: 100.0000 mg | ORAL_CAPSULE | Freq: Three times a day (TID) | ORAL | Status: DC | PRN

## 2015-03-24 NOTE — Patient Instructions (Signed)
Prednisone taper x 6 days  tessalon cough capsules every 8 hours as needed   flush your sinuses twice daily with Simply Saline or [employer]'s sinus rinse  (do over the sink because if you do it right you will spit out globs of mucus)   If not better in a week,  Call for chest x ray   2) Your ears are stopped up with wax.  Please make a Nurse visit appointment to get them cleaned out

## 2015-03-24 NOTE — Progress Notes (Signed)
Subjective:  Patient ID: Jimmy Jimenez, male    DOB: 03/13/1925  Age: 82 y.o. MRN: [id number]  CC: The primary encounter diagnosis was Viral URI with cough. A diagnosis of Bilateral hearing loss due to cerumen impaction was also pertinent to this visit.  HPI Jimmy Jimenez presents for cough and congestion lasting several weeks  Started Jan 1. Runny nose,  Sinus drainage is clear but occasionally streaked with blood .  Coughing,  Feeling hot and cold but no fevers,  No intense body aches  Not short of breath,  Not staying up all night coughing. "I just felel lousy" appetite ok .  hasnot lost any weight,  No nausea or diarrhea.    Outpatient Prescriptions Prior to Visit  Medication Sig Dispense Refill  . alendronate (FOSAMAX) 70 MG tablet TAKE ONE TABLET BY MOUTH ONCE A WEEK IN THE MORNING. TAKE WITH WATER 40 MINUTES PRIOR TO ANY FOOD AND REMAIN SITTING UP 12 tablet 4  . amLODipine (NORVASC) 5 MG tablet TAKE ONE TABLET BY MOUTH ONCE DAILY AS NEEDED FOR BLOOD PRESSURE OVER 160 30 tablet 3  . aspirin EC 81 MG tablet Take 81 mg by mouth daily.    . Calcium Citrate-Vitamin D (CITRACAL + D PO) Take 1 tablet by mouth 2 (two) times daily. Take 2 by mouth daily, calcium 630 and vitamin D 500 each.    . Cholecalciferol (VITAMIN D3) 1000 UNITS CAPS Take 1,000 Units by mouth 2 (two) times daily. Take 2 by mouth daily    . hydrALAZINE (APRESOLINE) 50 MG tablet TAKE ONE TABLET BY MOUTH THREE TIMES DAILY 90 tablet 1  . hydroxyurea (HYDREA) 500 MG capsule Take 500 mg by mouth daily. May take with food to minimize GI side effects.    [name] lisinopril (PRINIVIL,ZESTRIL) 40 MG tablet TAKE ONE TABLET BY MOUTH ONCE DAILY 30 tablet 11  . mirtazapine (REMERON) 7.5 MG tablet Take 1 tablet (7.5 mg total) by mouth at bedtime. 30 tablet 3  . polyethylene glycol (MIRALAX / GLYCOLAX) packet Take 17 g by mouth daily as needed.      No facility-administered medications prior to visit.    Review of Systems;  Patient denies  headache, fevers, malaise, unintentional weight loss, skin rash, eye pain, sinus congestion and sinus pain, sore throat, dysphagia,  hemoptysis , cough, dyspnea, wheezing, chest pain, palpitations, orthopnea, edema, abdominal pain, nausea, melena, diarrhea, constipation, flank pain, dysuria, hematuria, urinary  Frequency, nocturia, numbness, tingling, seizures,  Focal weakness, Loss of consciousness,  Tremor, insomnia, depression, anxiety, and suicidal ideation.      Objective:  BP 144/84 mmHg  Pulse 78  Temp(Src) 97.9 F (36.6 C) (Oral)  Ht 5\' 10"  (1.778 m)  SpO2 94%  BP Readings from Last 3 Encounters:  03/24/15 144/84  01/21/15 126/62  01/09/15 122/64    Wt Readings from Last 3 Encounters:  01/21/15 163 lb (73.936 kg)  01/09/15 164 lb (74.39 kg)  12/12/14 165 lb 8 oz (75.07 kg)    General appearance: alert, cooperative and appears stated age Ears: normal TM's and external ear canals both ears Throat: lips, mucosa, and tongue normal; teeth and gums normal Neck: no adenopathy, no carotid bruit, supple, symmetrical, trachea midline and thyroid not enlarged, symmetric, no tenderness/mass/nodules Back: symmetric, no curvature. ROM normal. No CVA tenderness. Lungs: clear to auscultation bilaterally Heart: regular rate and rhythm, S1, S2 normal, no murmur, click, rub or gallop Abdomen: soft, non-tender; bowel sounds normal; no masses,  no organomegaly Pulses: 2+ and  symmetric Skin: Skin color, texture, turgor normal. No rashes or lesions Lymph nodes: Cervical, supraclavicular, and axillary nodes normal.  No results found for: HGBA1C  Lab Results  Component Value Date   CREATININE 1.00 12/12/2014   CREATININE 0.97 12/07/2014   CREATININE 0.95 08/19/2014    Lab Results  Component Value Date   WBC 8.2 12/12/2014   HGB 13.5 12/12/2014   HCT 40.9 12/12/2014   PLT 385.0 12/12/2014   GLUCOSE 98 12/12/2014   LDLDIRECT 103.7 09/26/2013   ALT 13* 12/07/2014   AST 26 12/07/2014    NA 136 12/12/2014   K 4.7 12/12/2014   CL 97 12/12/2014   CREATININE 1.00 12/12/2014   BUN 12 12/12/2014   CO2 32 12/12/2014   TSH 1.42 12/12/2014   PSA 0.88 08/19/2014   MICROALBUR 0.7 08/30/2012    No results found.  Assessment & Plan:   Problem List Items Addressed This Visit    Viral URI with cough - Primary    URI is most likely viral given the mild HEENT  Symptoms  And normal exam.   I have explained that in viral URIS, an antibiotic will not help the symptoms and will increase the risk of developing diarrhea.,  Continue oral and nasal decongestants, and tylenol 650 mq 8 hrs for aches and pains,  And will prednisone  taper for inflammation      Bilateral hearing loss due to cerumen impaction    Patient will return for irrigation          I am having Mr. Ritthaler start on predniSONE and benzonatate. I am also having him maintain his Calcium Citrate-Vitamin D (CITRACAL + D PO), Vitamin D3, hydroxyurea, mirtazapine, alendronate, aspirin EC, polyethylene glycol, lisinopril, hydrALAZINE, and amLODipine.  Meds ordered this encounter  Medications  . predniSONE (DELTASONE) 10 MG tablet    Sig: 6 tablets on Day 1 , then reduce by 1 tablet daily until gone    Dispense:  21 tablet    Refill:  0  . benzonatate (TESSALON) 100 MG capsule    Sig: Take 1 capsule (100 mg total) by mouth 3 (three) times daily as needed for cough.    Dispense:  30 capsule    Refill:  0   .[id number] There are no discontinued medications.  Follow-up: No Follow-up on file.   Jimmy Mc, MD

## 2015-03-24 NOTE — Progress Notes (Signed)
Pre visit review using our clinic review tool, if applicable. No additional management support is needed unless otherwise documented below in the visit note. 

## 2015-03-25 ENCOUNTER — Ambulatory Visit (INDEPENDENT_AMBULATORY_CARE_PROVIDER_SITE_OTHER): Payer: PPO

## 2015-03-25 DIAGNOSIS — H6123 Impacted cerumen, bilateral: Secondary | ICD-10-CM

## 2015-03-25 NOTE — Progress Notes (Signed)
Patient came in for bilateral cerumen impaction.  Visualized left ear, copious amounts of dark, black dark orange wax noted in ear canal.  Irrigated with warm water and peroxide.  After irrigating for approximately 5 minutes all wax removed.  Visualized ear drum in left ear.  Inspected right ear, noted thick orange wax in ear.  Irrigated ear and removed thick copious wax from it also.  Patient commented that he could hear again once completed with both ears.   Please advise.

## 2015-03-26 ENCOUNTER — Telehealth: Payer: Self-pay | Admitting: *Deleted

## 2015-03-26 DIAGNOSIS — H6123 Impacted cerumen, bilateral: Secondary | ICD-10-CM | POA: Insufficient documentation

## 2015-03-26 NOTE — Assessment & Plan Note (Signed)
Patient will return for irrigation

## 2015-03-26 NOTE — Assessment & Plan Note (Signed)
URI is most likely viral given the mild HEENT  Symptoms  And normal exam.   I have explained that in viral URIS, an antibiotic will not help the symptoms and will increase the risk of developing diarrhea.,  Continue oral and nasal decongestants, and tylenol 650 mq 8 hrs for aches and pains,  And will prednisone  taper for inflammation 

## 2015-03-26 NOTE — Progress Notes (Signed)
Clinical history was suggestive of a diverticular versus hemorrhoidal bleeding. One-time event. Stool Hemoccults negative 6. Previous colonoscopies showed no adenomatous polyps.  Low likelihood of finding significant pathology on colonoscopy.  Would defer repeat colonoscopy unless new episode of bleeding.

## 2015-03-26 NOTE — Progress Notes (Signed)
   Recommend to patient that he use Debrox (available OTC) once a week in each ear to prevent buildip  I have reviewed the above information and agree with above.   Deborra Medina, MD

## 2015-03-26 NOTE — Telephone Encounter (Signed)
-----   Message from Robert Bellow, MD sent at 03/26/2015  8:48 AM EST ----- Please apologize to the patient for the delay in getting the report regarding the stool Hemoccults. They are all negative. Unless he has another episode of bleeding would not recommend a colonoscopy at this time.

## 2015-03-26 NOTE — Telephone Encounter (Signed)
Notified patient as instructed, patient pleased. Discussed follow-up appointments as needed, patient agrees  

## 2015-03-27 ENCOUNTER — Inpatient Hospital Stay: Payer: PPO

## 2015-03-27 ENCOUNTER — Inpatient Hospital Stay: Payer: PPO | Admitting: Internal Medicine

## 2015-03-31 ENCOUNTER — Encounter: Payer: Self-pay | Admitting: Family Medicine

## 2015-03-31 ENCOUNTER — Ambulatory Visit (INDEPENDENT_AMBULATORY_CARE_PROVIDER_SITE_OTHER): Payer: PPO | Admitting: Family Medicine

## 2015-03-31 VITALS — BP 132/74 | HR 83 | Temp 97.8°F | Ht 70.0 in | Wt 160.4 lb

## 2015-03-31 DIAGNOSIS — K5900 Constipation, unspecified: Secondary | ICD-10-CM

## 2015-03-31 DIAGNOSIS — K5909 Other constipation: Secondary | ICD-10-CM | POA: Insufficient documentation

## 2015-03-31 MED ORDER — LACTULOSE 20 GM/30ML PO SOLN: 20.0000 g | Freq: Every day | ORAL | Status: DC

## 2015-03-31 NOTE — Progress Notes (Signed)
Pre visit review using our clinic review tool, if applicable. No additional management support is needed unless otherwise documented below in the visit note. 

## 2015-03-31 NOTE — Patient Instructions (Signed)
Nice to meet you. Please start taking the lactulose to help with her bowel movements. You can continue to take the MiraLAX. If you develop fever, nausea, vomiting, diarrhea, blood in your stool, abdominal pain, or any new or change in symptoms please seek medical attention.

## 2015-03-31 NOTE — Progress Notes (Signed)
Patient ID: Jimmy Jimenez, male   DOB: 03/13/1925, 82 y.o.   MRN: [id number]  Jimmy Rumps, MD Phone: [phone]  Jimmy Jimenez is a 82 y.o. male who presents today for same-day visit.  Constipation: Patient notes at least for the last week he has been constipated. He notes an uncomfortable sensation in his stomach, though no abdominal pain. Notes he feels full of stool. He notes he had used an enema this morning to help and he had a bowel movement. No blood in his stool. No fevers. No nausea. No vomiting. No dysuria. No diarrhea. No focal discomfort. Does note his appetite is slightly decreased with this. He otherwise feels well overall. He has been using some MiraLAX. He has a history of constipation. He is passing small amounts of gas. He has not ever had any abdominal surgery. He used an enema to have a bowel movement this morning. PMH: nonsmoker.   ROS see history of present illness  Objective  Physical Exam Filed Vitals:   03/31/15 1527 03/31/15 1555  BP: 158/80 132/74  Pulse: 83   Temp: 97.8 F (36.6 C)    Physical Exam  Constitutional: He is well-developed, well-nourished, and in no distress.  HENT:  Head: Normocephalic and atraumatic.  Cardiovascular: Normal rate, regular rhythm and normal heart sounds.  Exam reveals no gallop and no friction rub.   No murmur heard. Pulmonary/Chest: Effort normal and breath sounds normal. No respiratory distress. He has no wheezes. He has no rales.  Abdominal: Soft. Bowel sounds are normal. He exhibits no distension. There is no tenderness. There is no rebound and no guarding.  Neurological: He is alert. Gait normal.  Skin: Skin is warm and dry. He is not diaphoretic.     Assessment/Plan: Please see individual problem list.  Constipation Patient's symptoms are likely related to constipation. His abdominal exam is benign today. Denies blood in his stool. He has a prior history of constipation. Had a bowel movement this morning and  is passing small amounts of gas, unlikely to be obstructed given this and normal bowel sounds. We'll treat with continued MiraLAX and add lactulose. I discussed obtaining a x-ray of his abdomen to evaluate for stool burden and to ensure that he is not obstructed, though patient opted to treat the constipation first prior to doing this. He is given return precautions.    Meds ordered this encounter  Medications  . Lactulose 20 GM/30ML SOLN    Sig: Take 30 mLs (20 g total) by mouth daily.    Dispense:  473 mL    Refill:  0     Jimmy Jimenez

## 2015-03-31 NOTE — Assessment & Plan Note (Signed)
Patient's symptoms are likely related to constipation. His abdominal exam is benign today. Denies blood in his stool. He has a prior history of constipation. Had a bowel movement this morning and is passing small amounts of gas, unlikely to be obstructed given this and normal bowel sounds. We'll treat with continued MiraLAX and add lactulose. I discussed obtaining a x-ray of his abdomen to evaluate for stool burden and to ensure that he is not obstructed, though patient opted to treat the constipation first prior to doing this. He is given return precautions.

## 2015-04-07 ENCOUNTER — Other Ambulatory Visit: Payer: Self-pay | Admitting: *Deleted

## 2015-04-07 MED ORDER — HYDROXYUREA 500 MG PO CAPS: 500.0000 mg | ORAL_CAPSULE | Freq: Every day | ORAL | Status: DC

## 2015-04-07 NOTE — Telephone Encounter (Signed)
Has an appt on 2/13 to see Dr Rogue Bussing

## 2015-04-14 ENCOUNTER — Inpatient Hospital Stay: Payer: PPO | Attending: Internal Medicine

## 2015-04-14 ENCOUNTER — Encounter: Payer: Self-pay | Admitting: Internal Medicine

## 2015-04-14 ENCOUNTER — Other Ambulatory Visit: Payer: Self-pay | Admitting: *Deleted

## 2015-04-14 ENCOUNTER — Inpatient Hospital Stay (HOSPITAL_BASED_OUTPATIENT_CLINIC_OR_DEPARTMENT_OTHER): Payer: PPO | Admitting: Internal Medicine

## 2015-04-14 VITALS — BP 175/75 | HR 75 | Temp 97.6°F | Resp 18 | Ht 70.0 in | Wt 161.2 lb

## 2015-04-14 DIAGNOSIS — Z79899 Other long term (current) drug therapy: Secondary | ICD-10-CM | POA: Insufficient documentation

## 2015-04-14 DIAGNOSIS — D7589 Other specified diseases of blood and blood-forming organs: Secondary | ICD-10-CM | POA: Insufficient documentation

## 2015-04-14 DIAGNOSIS — Z880 Allergy status to penicillin: Secondary | ICD-10-CM

## 2015-04-14 DIAGNOSIS — Z86010 Personal history of colonic polyps: Secondary | ICD-10-CM

## 2015-04-14 DIAGNOSIS — D473 Essential (hemorrhagic) thrombocythemia: Secondary | ICD-10-CM

## 2015-04-14 DIAGNOSIS — E785 Hyperlipidemia, unspecified: Secondary | ICD-10-CM

## 2015-04-14 DIAGNOSIS — Z7982 Long term (current) use of aspirin: Secondary | ICD-10-CM | POA: Diagnosis not present

## 2015-04-14 DIAGNOSIS — M810 Age-related osteoporosis without current pathological fracture: Secondary | ICD-10-CM | POA: Diagnosis not present

## 2015-04-14 DIAGNOSIS — Z8673 Personal history of transient ischemic attack (TIA), and cerebral infarction without residual deficits: Secondary | ICD-10-CM | POA: Insufficient documentation

## 2015-04-14 DIAGNOSIS — I10 Essential (primary) hypertension: Secondary | ICD-10-CM

## 2015-04-14 LAB — HEPATIC FUNCTION PANEL
ALBUMIN: 4.3 g/dL (ref 3.5–5.0)
ALK PHOS: 56 U/L (ref 38–126)
ALT: 11 U/L — AB (ref 17–63)
AST: 17 U/L (ref 15–41)
Bilirubin, Direct: 0.2 mg/dL (ref 0.1–0.5)
Indirect Bilirubin: 0.9 mg/dL (ref 0.3–0.9)
TOTAL PROTEIN: 6.8 g/dL (ref 6.5–8.1)
Total Bilirubin: 1.1 mg/dL (ref 0.3–1.2)

## 2015-04-14 LAB — CBC WITH DIFFERENTIAL/PLATELET
BASOS ABS: 0 10*3/uL (ref 0–0.1)
Basophils Relative: 1 %
EOS PCT: 2 %
Eosinophils Absolute: 0.1 10*3/uL (ref 0–0.7)
HEMATOCRIT: 39.4 % — AB (ref 40.0–52.0)
Hemoglobin: 13.8 g/dL (ref 13.0–18.0)
LYMPHS ABS: 1.3 10*3/uL (ref 1.0–3.6)
LYMPHS PCT: 20 %
MCH: 39.6 pg — AB (ref 26.0–34.0)
MCHC: 35 g/dL (ref 32.0–36.0)
MCV: 113.1 fL — AB (ref 80.0–100.0)
Monocytes Absolute: 0.5 10*3/uL (ref 0.2–1.0)
Monocytes Relative: 8 %
NEUTROS ABS: 4.4 10*3/uL (ref 1.4–6.5)
Neutrophils Relative %: 69 %
PLATELETS: 278 10*3/uL (ref 150–440)
RBC: 3.48 MIL/uL — AB (ref 4.40–5.90)
RDW: 13.2 % (ref 11.5–14.5)
WBC: 6.3 10*3/uL (ref 3.8–10.6)

## 2015-04-14 LAB — CREATININE, SERUM
Creatinine, Ser: 1.08 mg/dL (ref 0.61–1.24)
GFR calc non Af Amer: 59 mL/min — ABNORMAL LOW (ref >60)

## 2015-04-14 NOTE — Progress Notes (Signed)
[place] OFFICE PROGRESS NOTE  Patient Care Team: Crecencio Mc, MD as PCP - General (Internal Medicine) Robert Bellow, MD as Consulting Physician (General Surgery) Crecencio Mc, MD as Referring Physician (Internal Medicine) Leia Alf, MD as Medical Oncologist (Internal Medicine)   SUMMARY OF HEMATOLOGIC HISTORY:  # 2008- ESSENTIAL THROMBOCYTOSIS- Hydrea 55m/d; NOV 2008- BMBx- slight increase in enlarged megakayocytes; bcr-abl-Neg;Jak-2 NEG   # previous TIA   INTERVAL HISTORY:  This is my first interaction with the patient since I joined the practice September 2016. I reviewed the patient's prior charts/pertinent labs/ findings are summarized above. Patient denies any recent blood clots or any strokes or TIAs. Denies any burning pain in his extremities.  No weight loss no night sweats or any fevers. Patient is remarkably active for his age.   More recently patient had" sinus congestion".   REVIEW OF SYSTEMS:  A complete 10 point review of system is done which is negative except mentioned above/history of present illness.   PAST MEDICAL HISTORY :  Past Medical History  Diagnosis Date  . Lung nodule 12/2004    found on CXR  . Essential thrombocytosis ([facility]   . Hyperlipidemia   . Hypertension   . Osteoporosis   . Essential thrombocytosis ([facility]   . Colon polyps     PAST SURGICAL HISTORY :   Past Surgical History  Procedure Laterality Date  . Nasal sinus surgery  06/11/05  . Colonoscopy  2007, 2010    Dr WAllen Norris . Bone marrow biopsy  01/18/07    FAMILY HISTORY :   Family History  Problem Relation Age of Onset  . Colon cancer Brother     colon age 80's . Colon cancer Mother     colon age 80's . Peripheral vascular disease Father   . Diabetes      SOCIAL HISTORY:   Social History  Substance Use Topics  . Smoking status: Never Smoker   . Smokeless tobacco: Never Used  . Alcohol Use: No    ALLERGIES:  is allergic to amlodipine;  atenolol; erythromycin; furosemide; hctz; levofloxacin; metoprolol; micardis; penicillins; terazosin; and triamterene-hctz.  MEDICATIONS:  Current Outpatient Prescriptions  Medication Sig Dispense Refill  . alendronate (FOSAMAX) 70 MG tablet TAKE ONE TABLET BY MOUTH ONCE A WEEK IN THE MORNING. TAKE WITH WATER 40 MINUTES PRIOR TO ANY FOOD AND REMAIN SITTING UP 12 tablet 4  . amLODipine (NORVASC) 5 MG tablet TAKE ONE TABLET BY MOUTH ONCE DAILY AS NEEDED FOR BLOOD PRESSURE OVER 160 30 tablet 3  . aspirin EC 81 MG tablet Take 81 mg by mouth daily.    . benzonatate (TESSALON) 100 MG capsule Take 1 capsule (100 mg total) by mouth 3 (three) times daily as needed for cough. 30 capsule 0  . Calcium Citrate-Vitamin D (CITRACAL + D PO) Take 1 tablet by mouth 2 (two) times daily. Take 2 by mouth daily, calcium 630 and vitamin D 500 each.    . Cholecalciferol (VITAMIN D3) 1000 UNITS CAPS Take 1,000 Units by mouth 2 (two) times daily. Take 2 by mouth daily    . hydrALAZINE (APRESOLINE) 50 MG tablet TAKE ONE TABLET BY MOUTH THREE TIMES DAILY 90 tablet 1  . hydroxyurea (HYDREA) 500 MG capsule Take 1 capsule (500 mg total) by mouth daily. May take with food to minimize GI side effects. 30 capsule 1  . Lactulose 20 GM/30ML SOLN Take 30 mLs (20 g total) by mouth daily. 473 mL  0  . lisinopril (PRINIVIL,ZESTRIL) 40 MG tablet TAKE ONE TABLET BY MOUTH ONCE DAILY 30 tablet 11  . mirtazapine (REMERON) 7.5 MG tablet Take 1 tablet (7.5 mg total) by mouth at bedtime. 30 tablet 3  . polyethylene glycol (MIRALAX / GLYCOLAX) packet Take 17 g by mouth daily as needed.      No current facility-administered medications for this visit.    PHYSICAL EXAMINATION:   BP 175/75 mmHg  Pulse 75  Temp(Src) 97.6 F (36.4 C) (Tympanic)  Resp 18  Ht 5' 10"  (1.778 m)  Wt 161 lb 2.5 oz (73.1 kg)  BMI 23.12 kg/m2  Filed Weights   04/14/15 0945  Weight: 161 lb 2.5 oz (73.1 kg)    GENERAL: Well-nourished well-developed; Alert, no  distress and comfortable. Accompanied by his wife. EYES: no pallor or icterus OROPHARYNX: no thrush or ulceration; good dentition  NECK: supple, no masses felt LYMPH:  no palpable lymphadenopathy in the cervical, axillary or inguinal regions LUNGS: clear to auscultation and  No wheeze or crackles HEART/CVS: regular rate & rhythm and no murmurs; No lower extremity edema ABDOMEN:abdomen soft, non-tender and normal bowel sounds Musculoskeletal:no cyanosis of digits and no clubbing  PSYCH: alert & oriented x 3 with fluent speech NEURO: no focal motor/sensory deficits SKIN:  no rashes or significant lesions  LABORATORY DATA:  I have reviewed the data as listed    Component Value Date/Time   NA 136 12/12/2014 0945   NA 136 05/19/2011 1049   K 4.7 12/12/2014 0945   K 4.3 05/19/2011 1049   CL 97 12/12/2014 0945   CL 99 05/19/2011 1049   CO2 32 12/12/2014 0945   CO2 32 05/19/2011 1049   GLUCOSE 98 12/12/2014 0945   GLUCOSE 116* 05/19/2011 1049   BUN 12 12/12/2014 0945   BUN 8 05/19/2011 1049   CREATININE 1.08 04/14/2015 0935   CREATININE 1.15 12/24/2013 0921   CREATININE 1.2 12/17/2013 0921   CALCIUM 9.5 12/12/2014 0945   CALCIUM 9.0 05/19/2011 1049   PROT 6.8 04/14/2015 0935   PROT 6.8 12/24/2013 0921   ALBUMIN 4.3 04/14/2015 0935   ALBUMIN 4.0 12/24/2013 0921   AST 17 04/14/2015 0935   AST 23 12/24/2013 0921   ALT 11* 04/14/2015 0935   ALT 19 12/24/2013 0921   ALKPHOS 56 04/14/2015 0935   ALKPHOS 57 12/24/2013 0921   BILITOT 1.1 04/14/2015 0935   BILITOT 0.9 12/24/2013 0921   GFRNONAA 59* 04/14/2015 0935   GFRNONAA >60 12/24/2013 0921   GFRNONAA 50* 09/03/2013 0930   GFRAA >60 04/14/2015 0935   GFRAA >60 12/24/2013 0921   GFRAA 58* 09/03/2013 0930    No results found for: SPEP, UPEP  Lab Results  Component Value Date   WBC 6.3 04/14/2015   NEUTROABS 4.4 04/14/2015   HGB 13.8 04/14/2015   HCT 39.4* 04/14/2015   MCV 113.1* 04/14/2015   PLT 278 04/14/2015       Chemistry      Component Value Date/Time   NA 136 12/12/2014 0945   NA 136 05/19/2011 1049   K 4.7 12/12/2014 0945   K 4.3 05/19/2011 1049   CL 97 12/12/2014 0945   CL 99 05/19/2011 1049   CO2 32 12/12/2014 0945   CO2 32 05/19/2011 1049   BUN 12 12/12/2014 0945   BUN 8 05/19/2011 1049   CREATININE 1.08 04/14/2015 0935   CREATININE 1.15 12/24/2013 0921   CREATININE 1.2 12/17/2013 0921      Component Value Date/Time  CALCIUM 9.5 12/12/2014 0945   CALCIUM 9.0 05/19/2011 1049   ALKPHOS 56 04/14/2015 0935   ALKPHOS 57 12/24/2013 0921   AST 17 04/14/2015 0935   AST 23 12/24/2013 0921   ALT 11* 04/14/2015 0935   ALT 19 12/24/2013 0921   BILITOT 1.1 04/14/2015 0935   BILITOT 0.9 12/24/2013 0921       RADIOGRAPHIC STUDIES: I have personally reviewed the radiological images as listed and agreed with the findings in the report. No results found.   ASSESSMENT & PLAN:   # Essential thrombocytosis- on Hydrea-500 mg once a day. Today CBC within normal limits-platelet count 278/macrocytosis from Hydrea. Creatinine stable at 1 LFTs normal. No other side effects noted. Continue Hydrea 500 mg once a day.  # Will plan to check MPL/CALR mutation at next visit. Check CBC CMP/follow-up in 6 months.      Cammie Sickle, MD 04/14/2015 10:06 AM

## 2015-04-21 ENCOUNTER — Other Ambulatory Visit: Payer: Self-pay | Admitting: Internal Medicine

## 2015-04-30 ENCOUNTER — Ambulatory Visit
Admission: RE | Admit: 2015-04-30 | Discharge: 2015-04-30 | Disposition: A | Discharge status: Home / Self Care | Payer: PPO | Source: Ambulatory Visit | Attending: Internal Medicine | Admitting: Internal Medicine

## 2015-04-30 DIAGNOSIS — Z13820 Encounter for screening for osteoporosis: Secondary | ICD-10-CM | POA: Diagnosis present

## 2015-04-30 DIAGNOSIS — M8580 Other specified disorders of bone density and structure, unspecified site: Secondary | ICD-10-CM | POA: Insufficient documentation

## 2015-04-30 DIAGNOSIS — M8589 Other specified disorders of bone density and structure, multiple sites: Secondary | ICD-10-CM | POA: Diagnosis not present

## 2015-04-30 DIAGNOSIS — M810 Age-related osteoporosis without current pathological fracture: Secondary | ICD-10-CM

## 2015-05-05 ENCOUNTER — Telehealth: Payer: Self-pay | Admitting: Internal Medicine

## 2015-05-05 NOTE — Telephone Encounter (Signed)
Patient notified and voiced understanding appointment made to discuss treatment.

## 2015-05-05 NOTE — Telephone Encounter (Signed)
Bone Density scores received,he has osteoporosis .  I recommend treating with medication but would need to discuss the pros and cons in an office visit continue calcium 1200 to 1800 mg daily through diet and supplements ,  2000 units of vitamin d and weight bearing exercise on a regular basis, and make appt to discuss

## 2015-05-19 ENCOUNTER — Ambulatory Visit (INDEPENDENT_AMBULATORY_CARE_PROVIDER_SITE_OTHER): Payer: PPO | Admitting: Internal Medicine

## 2015-05-19 ENCOUNTER — Encounter: Payer: Self-pay | Admitting: Internal Medicine

## 2015-05-19 VITALS — BP 168/68 | HR 69 | Temp 97.6°F | Resp 12 | Ht 67.0 in | Wt 162.2 lb

## 2015-05-19 DIAGNOSIS — R634 Abnormal weight loss: Secondary | ICD-10-CM | POA: Diagnosis not present

## 2015-05-19 DIAGNOSIS — E291 Testicular hypofunction: Secondary | ICD-10-CM | POA: Diagnosis not present

## 2015-05-19 DIAGNOSIS — M810 Age-related osteoporosis without current pathological fracture: Secondary | ICD-10-CM | POA: Diagnosis not present

## 2015-05-19 DIAGNOSIS — R7989 Other specified abnormal findings of blood chemistry: Secondary | ICD-10-CM

## 2015-05-19 LAB — TSH: TSH: 1.41 u[IU]/mL (ref 0.35–4.50)

## 2015-05-19 LAB — VITAMIN D 25 HYDROXY (VIT D DEFICIENCY, FRACTURES): VITD: 45.79 ng/mL (ref 30.00–100.00)

## 2015-05-19 NOTE — Progress Notes (Signed)
Subjective:  Patient ID: Jimmy Jimenez, male    DOB: 03/13/1925  Age: 82 y.o. MRN: [id number]  CC: The primary encounter diagnosis was Osteoporosis. Diagnoses of Loss of weight and Low serum testosterone were also pertinent to this visit.  HPI Cogan Vanzant presents for discussion of recent DEXA scan. . There has been no interval improvement in osteoporosis over the past 8 years despite use of fosamax .  He has no history of drug intolerance, and no  history of fractures.     He is walking daily and taking 3 calcium tablets daily and 2,000 IUs daily    Outpatient Prescriptions Prior to Visit  Medication Sig Dispense Refill  . alendronate (FOSAMAX) 70 MG tablet TAKE ONE TABLET BY MOUTH ONCE A WEEK IN THE MORNING. TAKE WITH WATER 40 MINUTES PRIOR TO ANY FOOD AND REMAIN SITTING UP 12 tablet 4  . amLODipine (NORVASC) 5 MG tablet TAKE ONE TABLET BY MOUTH ONCE DAILY AS NEEDED FOR BLOOD PRESSURE OVER 160 30 tablet 3  . aspirin EC 81 MG tablet Take 81 mg by mouth daily.    . benzonatate (TESSALON) 100 MG capsule Take 1 capsule (100 mg total) by mouth 3 (three) times daily as needed for cough. 30 capsule 0  . Calcium Citrate-Vitamin D (CITRACAL + D PO) Take 3 tablets by mouth daily. Take 2 by mouth daily, calcium 630 and vitamin D 500 each.    . Cholecalciferol (VITAMIN D3) 1000 UNITS CAPS Take 2,000 Units by mouth daily. Take 2 by mouth daily    . hydrALAZINE (APRESOLINE) 50 MG tablet TAKE ONE TABLET BY MOUTH THREE TIMES DAILY 90 tablet 1  . hydroxyurea (HYDREA) 500 MG capsule Take 1 capsule (500 mg total) by mouth daily. May take with food to minimize GI side effects. 30 capsule 1  . lisinopril (PRINIVIL,ZESTRIL) 40 MG tablet TAKE ONE TABLET BY MOUTH ONCE DAILY 30 tablet 11  . polyethylene glycol (MIRALAX / GLYCOLAX) packet Take 17 g by mouth daily as needed. Reported on 05/19/2015    . Lactulose 20 GM/30ML SOLN Take 30 mLs (20 g total) by mouth daily. (Patient not taking: Reported on 05/19/2015) 473  mL 0  . mirtazapine (REMERON) 7.5 MG tablet Take 1 tablet (7.5 mg total) by mouth at bedtime. (Patient not taking: Reported on 05/19/2015) 30 tablet 3   No facility-administered medications prior to visit.    Review of Systems;  Patient denies headache, fevers, malaise, unintentional weight loss, skin rash, eye pain, sinus congestion and sinus pain, sore throat, dysphagia,  hemoptysis , cough, dyspnea, wheezing, chest pain, palpitations, orthopnea, edema, abdominal pain, nausea, melena, diarrhea, constipation, flank pain, dysuria, hematuria, urinary  Frequency, nocturia, numbness, tingling, seizures,  Focal weakness, Loss of consciousness,  Tremor, insomnia, depression, anxiety, and suicidal ideation.      Objective:  BP 168/68 mmHg  Pulse 69  Temp(Src) 97.6 F (36.4 C) (Oral)  Resp 12  Ht 5\' 7"  (1.702 m)  Wt 162 lb 4 oz (73.596 kg)  BMI 25.41 kg/m2  SpO2 97%  BP Readings from Last 3 Encounters:  05/19/15 168/68  04/14/15 175/75  03/31/15 132/74    Wt Readings from Last 3 Encounters:  05/19/15 162 lb 4 oz (73.596 kg)  04/30/15 156 lb (70.761 kg)  04/14/15 161 lb 2.5 oz (73.1 kg)    General appearance: alert, cooperative and appears stated age Ears: normal TM's and external ear canals both ears Throat: lips, mucosa, and tongue normal; teeth and gums normal Neck:  no adenopathy, no carotid bruit, supple, symmetrical, trachea midline and thyroid not enlarged, symmetric, no tenderness/mass/nodules Back: symmetric, no curvature. ROM normal. No CVA tenderness. Lungs: clear to auscultation bilaterally Heart: regular rate and rhythm, S1, S2 normal, no murmur, click, rub or gallop Abdomen: soft, non-tender; bowel sounds normal; no masses,  no organomegaly Pulses: 2+ and symmetric Skin: Skin color, texture, turgor normal. No rashes or lesions Lymph nodes: Cervical, supraclavicular, and axillary nodes normal.  No results found for: HGBA1C  Lab Results  Component Value Date    CREATININE 1.08 04/14/2015   CREATININE 1.00 12/12/2014   CREATININE 0.97 12/07/2014    Lab Results  Component Value Date   WBC 6.3 04/14/2015   HGB 13.8 04/14/2015   HCT 39.4* 04/14/2015   PLT 278 04/14/2015   GLUCOSE 98 12/12/2014   LDLDIRECT 103.7 09/26/2013   ALT 11* 04/14/2015   AST 17 04/14/2015   NA 136 12/12/2014   K 4.7 12/12/2014   CL 97 12/12/2014   CREATININE 1.08 04/14/2015   BUN 12 12/12/2014   CO2 32 12/12/2014   TSH 1.41 05/19/2015   PSA 0.88 08/19/2014   MICROALBUR 0.7 08/30/2012    Dg Bone Density  04/30/2015  EXAM: DUAL X-RAY ABSORPTIOMETRY (DXA) FOR BONE MINERAL DENSITY IMPRESSION: Dear Dr. Derrel Nip, Your patient Saharsh Cobarrubias completed a BMD test on 04/30/2015 using the [place] (analysis version: 14.10) manufactured by [employer]. The following summarizes the results of our evaluation. PATIENT BIOGRAPHICAL: Name: Jimmy, Jimenez Patient ID: [id number] Birth Date: 03/13/1925 Height: 67.0 in. Gender: Male Exam Date: 04/30/2015 Weight: 156.0 lbs. Indications: Advanced Age, Height Loss Fractures: Treatments: CALCIUM VIT D ASSESSMENT: The BMD measured at Femur Neck Left is 0.764 g/cm2 with a T-score of -2.4 is low. Fracture risk is high. A follow up DXA test is recommended in one year to monitor response to therapy. Site Region Measured Measured WHO Young Adult BMD Date       Age      Classification T-score AP Spine L1-L4 04/30/2015 88.1 Osteopenia -2.1 0.975 g/cm2 AP Spine L1-L4 04/19/2007 80.1 Osteopenia -2.0 0.984 g/cm2 DualFemur Neck Left 04/30/2015 88.1 Osteopenia -2.4 0.764 g/cm2 DualFemur Neck Left 04/19/2007 80.1 Osteopenia -2.4 0.752 g/cm2 World Health Organization [facility]) criteria for post-menopausal, Caucasian Women: Normal:       T-score at or above -1 SD Osteopenia:   T-score between -1 and -2.5 SD Osteoporosis: T-score at or below -2.5 SD RECOMMENDATIONS: [facility] recommends that FDA-approved medical therapies be considered in  postmenopausal women and men age 29 or older with a: 1. Hip or vertebral (clinical or morphometric) fracture. 2. T-score of < -2.5 at the spine or hip. 3. Ten-year fracture probability by FRAX of 3% or greater for hip fracture or 20% or greater for major osteoporotic fracture. All treatment decisions require clinical judgment and consideration of individual patient factors, including patient preferences, co-morbidities, previous drug use, risk factors not captured in the FRAX model (e.g. falls, vitamin D deficiency, increased bone turnover, interval significant decline in bone density) and possible under - or over-estimation of fracture risk by FRAX. All patients should ensure an adequate intake of dietary calcium (1200 mg/d) and vitamin D (800 IU daily) unless contraindicated. FOLLOW-UP: People with diagnosed cases of osteoporosis or osteopenia should be regularly tested for bone mineral density. For patients eligible for Medicare, routine testing is allowed once every 2 years. The testing frequency can be increased to one year for patients who have rapidly progressing disease, or  for those who are receiving medical therapy to restore bone mass. I have reviewed this report, and agree with the above findings. [facility] Radiology Dear Dr. Derrel Nip, Your patient Johntavious Brickler completed a FRAX assessment on 04/30/2015 using the [place] (analysis version: 14.10) manufactured by [employer]. The following summarizes the results of our evaluation. PATIENT BIOGRAPHICAL: Name: Jaykub, Parsa Patient ID: [id number] Birth Date: 03/13/1925 Height:    67.0 in. Gender:     Male      Age:        89.1       Weight:    156.0 lbs. Ethnicity:  Black                            Exam Date: 04/30/2015 FRAX* RESULTS:  (version: 3.5) 10-year Probability of Fracture1 Major Osteoporotic Fracture2 Hip Fracture 3.9% 1.7% Population: [place] (Black) Risk Factors: None Based on Femur (Left) Neck BMD 1 -The 10-year probability of  fracture may be lower than reported if the patient has received treatment. 2 -Major Osteoporotic Fracture: Clinical Spine, Forearm, Hip or Shoulder *FRAX is a Materials engineer of the [employer] for Metabolic Bone Disease, a [place] (WHO) [facility]. ASSESSMENT: The probability of a major osteoporotic fracture is 3.9 within the next ten years. The probability of a hip fracture is 1.7 within the next ten years. . Electronically Signed   By: Lahoma Crocker M.D.   On: 04/30/2015 13:39    Assessment & Plan:   Problem List Items Addressed This Visit    Osteoporosis - Primary    No improvement despite alendronate for over 5 years.  Low testosterone levels may be contributing,  Will start supplementation. With Im injections,      Relevant Orders   Testosterone Total,Free,Bio, Males (Completed)   VITAMIN D 25 Hydroxy (Vit-D Deficiency, Fractures) (Completed)   N-Telopeptide, Serum (Completed)   Low serum testosterone    With osteoprosis failing to improve despite therapy.  Starting supplementation.       Loss of weight   Relevant Orders   N-Telopeptide, Serum (Completed)     A total of 25 minutes of face to face time was spent with patient more than half of which was spent in counselling about the above mentioned conditions  and coordination of care    I have discontinued Mr. Elgart mirtazapine and Lactulose. I am also having him start on testosterone cypionate. Additionally, I am having him maintain his Calcium Citrate-Vitamin D (CITRACAL + D PO), Vitamin D3, alendronate, aspirin EC, polyethylene glycol, lisinopril, amLODipine, benzonatate, hydroxyurea, and hydrALAZINE.  Meds ordered this encounter  Medications  . testosterone cypionate (DEPOTESTOSTERONE CYPIONATE) 200 MG/ML injection    Sig: Inject 0.5 mLs (100 mg total) into the muscle every 14 (fourteen) days.    Dispense:  10 mL    Refill:  0    PATIENT TO BRING TO OFFICE TO  RECEIVE BIWEEKLY INJECTIONS    Medications Discontinued During This Encounter  Medication Reason  . mirtazapine (REMERON) 7.5 MG tablet   . Lactulose 20 GM/30ML SOLN     Follow-up: No Follow-up on file.   Crecencio Mc, MD

## 2015-05-19 NOTE — Progress Notes (Signed)
Pre-visit discussion using our clinic review tool. No additional management support is needed unless otherwise documented below in the visit note.  

## 2015-05-19 NOTE — Patient Instructions (Signed)
If your testosterone level is very low,  I will recommend that we supplement it with injections   Testosterone injection What is this medicine? TESTOSTERONE (tes TOS ter one) is the main male hormone. It supports normal male development such as muscle growth, facial hair, and deep voice. It is used in males to treat low testosterone levels. This medicine may be used for other purposes; ask your health care provider or pharmacist if you have questions. What should I tell my health care provider before I take this medicine? They need to know if you have any of these conditions: -breast cancer -diabetes -heart disease -kidney disease -liver disease -lung disease -prostate cancer, enlargement -an unusual or allergic reaction to testosterone, other medicines, foods, dyes, or preservatives -pregnant or trying to get pregnant -breast-feeding How should I use this medicine? This medicine is for injection into a muscle. It is usually given by a health care professional in a hospital or clinic setting. Contact your pediatrician regarding the use of this medicine in children. While this medicine may be prescribed for children as young as 51 years of age for selected conditions, precautions do apply. Overdosage: If you think you have taken too much of this medicine contact a poison control center or emergency room at once. NOTE: This medicine is only for you. Do not share this medicine with others. What if I miss a dose? Try not to miss a dose. Your doctor or health care professional will tell you when your next injection is due. Notify the office if you are unable to keep an appointment. What may interact with this medicine? -medicines for diabetes -medicines that treat or prevent blood clots like warfarin -oxyphenbutazone -propranolol -steroid medicines like prednisone or cortisone This list may not describe all possible interactions. Give your health care provider a list of all the medicines,  herbs, non-prescription drugs, or dietary supplements you use. Also tell them if you smoke, drink alcohol, or use illegal drugs. Some items may interact with your medicine. What should I watch for while using this medicine? Visit your doctor or health care professional for regular checks on your progress. They will need to check the level of testosterone in your blood. This medicine is only approved for use in men who have low levels of testosterone related to certain medical conditions. Heart attacks and strokes have been reported with the use of this medicine. Notify your doctor or health care professional and seek emergency treatment if you develop breathing problems; changes in vision; confusion; chest pain or chest tightness; sudden arm pain; severe, sudden headache; trouble speaking or understanding; sudden numbness or weakness of the face, arm or leg; loss of balance or coordination. Talk to your doctor about the risks and benefits of this medicine. This medicine may affect blood sugar levels. If you have diabetes, check with your doctor or health care professional before you change your diet or the dose of your diabetic medicine. This drug is banned from use in athletes by most athletic organizations. What side effects may I notice from receiving this medicine? Side effects that you should report to your doctor or health care professional as soon as possible: -allergic reactions like skin rash, itching or hives, swelling of the face, lips, or tongue -breast enlargement -breathing problems -changes in mood, especially anger, depression, or rage -dark urine -general ill feeling or flu-like symptoms -light-colored stools -loss of appetite, nausea -nausea, vomiting -right upper belly pain -stomach pain -swelling of ankles -too frequent or persistent erections -  trouble passing urine or change in the amount of urine -unusually weak or tired -yellowing of the eyes or skin Additional side  effects that can occur in women include: -deep or hoarse voice -facial hair growth -irregular menstrual periods Side effects that usually do not require medical attention (report to your doctor or health care professional if they continue or are bothersome): -acne -change in sex drive or performance -hair loss -headache This list may not describe all possible side effects. Call your doctor for medical advice about side effects. You may report side effects to FDA at 1-800-FDA-1088. Where should I keep my medicine? Keep out of the reach of children. This medicine can be abused. Keep your medicine in a safe place to protect it from theft. Do not share this medicine with anyone. Selling or giving away this medicine is dangerous and against the law. Store at room temperature between 20 and 25 degrees C (68 and 77 degrees F). Do not freeze. Protect from light. Follow the directions for the product you are prescribed. Throw away any unused medicine after the expiration date. NOTE: This sheet is a summary. It may not cover all possible information. If you have questions about this medicine, talk to your doctor, pharmacist, or health care provider.    2016, Elsevier/Gold Standard. (2013-05-03 [id number])

## 2015-05-20 DIAGNOSIS — R7989 Other specified abnormal findings of blood chemistry: Secondary | ICD-10-CM | POA: Insufficient documentation

## 2015-05-20 LAB — TESTOSTERONE TOTAL,FREE,BIO, MALES
ALBUMIN: 4.2 g/dL (ref 3.6–5.1)
SEX HORMONE BINDING: 104 nmol/L — AB (ref 22–77)
TESTOSTERONE BIOAVAILABLE: 9.6 ng/dL — AB (ref 130.5–681.7)
TESTOSTERONE FREE: 5 pg/mL — AB (ref 47.0–244.0)
TESTOSTERONE: 111 ng/dL — AB (ref 250–827)

## 2015-05-20 LAB — N-TELOPEPTIDE, SERUM: NTX Telopeptide: 6.9 nmol BCE/L (ref 5.4–24.2)

## 2015-05-20 MED ORDER — TESTOSTERONE CYPIONATE 200 MG/ML IM SOLN: 100.0000 mg | INTRAMUSCULAR | Status: DC

## 2015-05-20 NOTE — Assessment & Plan Note (Signed)
With osteoprosis failing to improve despite therapy.  Starting supplementation.

## 2015-05-20 NOTE — Assessment & Plan Note (Signed)
No improvement despite alendronate for over 5 years.  Low testosterone levels may be contributing,  Will start supplementation. With Im injections,

## 2015-05-28 ENCOUNTER — Ambulatory Visit: Payer: PPO

## 2015-05-28 DIAGNOSIS — R7989 Other specified abnormal findings of blood chemistry: Secondary | ICD-10-CM

## 2015-05-28 MED ORDER — TESTOSTERONE CYPIONATE 200 MG/ML IM SOLN
100.0000 mg | INTRAMUSCULAR | Status: DC
  Administered 2015-05-28 – 2015-08-20 (×4): 100 mg via INTRAMUSCULAR

## 2015-05-28 NOTE — Progress Notes (Signed)
Patient ID: Jimmy Jimenez, male   DOB: 03/13/1925, 82 y.o.   MRN: [id number] Patient presented for first testosterone injection.  Administered R deltoid, tolerated well.  Verbalized understanding of the need to return every 2 weeks for injection, per physican's order.

## 2015-06-09 ENCOUNTER — Other Ambulatory Visit: Payer: Self-pay | Admitting: Internal Medicine

## 2015-06-09 NOTE — Telephone Encounter (Addendum)
   Ref Range 79mo ago    WBC 3.8 - 10.6 K/uL 6.3   RBC 4.40 - 5.90 MIL/uL 3.48 (L)   Hemoglobin 13.0 - 18.0 g/dL 13.8   HCT 40.0 - 52.0 % 39.4 (L)   MCV 80.0 - 100.0 fL 113.1 (H)   MCH 26.0 - 34.0 pg 39.6 (H)   MCHC 32.0 - 36.0 g/dL 35.0   RDW 11.5 - 14.5 % 13.2   Platelets 150 - 440 K/uL 278   Neutrophils Relative % % 69   Neutro Abs 1.4 - 6.5 K/uL 4.4   Lymphocytes Relative % 20   Lymphs Abs 1.0 - 3.6 K/uL 1.3   Monocytes Relative % 8   Monocytes Absolute 0.2 - 1.0 K/uL 0.5   Eosinophils Relative % 2   Eosinophils Absolute 0 - 0.7 K/uL 0.1   Basophils Relative % 1   Basophils Absolute 0 - 0.1 K/uL 0.0        Next appt in August

## 2015-06-11 ENCOUNTER — Ambulatory Visit (INDEPENDENT_AMBULATORY_CARE_PROVIDER_SITE_OTHER): Payer: PPO

## 2015-06-11 DIAGNOSIS — E291 Testicular hypofunction: Secondary | ICD-10-CM | POA: Diagnosis not present

## 2015-06-11 DIAGNOSIS — R7989 Other specified abnormal findings of blood chemistry: Secondary | ICD-10-CM

## 2015-06-11 MED ORDER — TESTOSTERONE CYPIONATE 100 MG/ML IM SOLN
100.0000 mg | INTRAMUSCULAR | Status: DC
  Administered 2015-06-11: 100 mg via INTRAMUSCULAR

## 2015-06-11 NOTE — Progress Notes (Signed)
Patient came in for testosterone injection, Patient brought medication with him.  Received injection in left deltoid.  Patient tolerated well.

## 2015-06-25 ENCOUNTER — Ambulatory Visit (INDEPENDENT_AMBULATORY_CARE_PROVIDER_SITE_OTHER): Payer: PPO

## 2015-06-25 DIAGNOSIS — R7989 Other specified abnormal findings of blood chemistry: Secondary | ICD-10-CM

## 2015-06-25 DIAGNOSIS — E291 Testicular hypofunction: Secondary | ICD-10-CM | POA: Diagnosis not present

## 2015-06-25 MED ORDER — TESTOSTERONE CYPIONATE 100 MG/ML IM SOLN
100.0000 mg | INTRAMUSCULAR | Status: DC
  Administered 2015-06-25 – 2016-04-15 (×8): 100 mg via INTRAMUSCULAR

## 2015-07-07 DIAGNOSIS — B9689 Other specified bacterial agents as the cause of diseases classified elsewhere: Secondary | ICD-10-CM | POA: Diagnosis not present

## 2015-07-07 DIAGNOSIS — J019 Acute sinusitis, unspecified: Secondary | ICD-10-CM | POA: Diagnosis not present

## 2015-07-08 ENCOUNTER — Other Ambulatory Visit: Payer: Self-pay | Admitting: Internal Medicine

## 2015-07-09 ENCOUNTER — Ambulatory Visit (INDEPENDENT_AMBULATORY_CARE_PROVIDER_SITE_OTHER): Payer: PPO | Admitting: Surgical

## 2015-07-09 DIAGNOSIS — E291 Testicular hypofunction: Secondary | ICD-10-CM | POA: Diagnosis not present

## 2015-07-09 DIAGNOSIS — R7989 Other specified abnormal findings of blood chemistry: Secondary | ICD-10-CM

## 2015-07-09 NOTE — Progress Notes (Signed)
Patient came in today for testosterone injection. 0.5 ml given in right gluteal. Patient tolerated well.

## 2015-07-10 ENCOUNTER — Other Ambulatory Visit: Payer: Self-pay | Admitting: Internal Medicine

## 2015-07-23 ENCOUNTER — Ambulatory Visit: Payer: PPO

## 2015-07-23 ENCOUNTER — Ambulatory Visit (INDEPENDENT_AMBULATORY_CARE_PROVIDER_SITE_OTHER): Payer: PPO

## 2015-07-23 DIAGNOSIS — R7989 Other specified abnormal findings of blood chemistry: Secondary | ICD-10-CM

## 2015-07-23 DIAGNOSIS — E291 Testicular hypofunction: Secondary | ICD-10-CM | POA: Diagnosis not present

## 2015-07-23 MED ORDER — TESTOSTERONE CYPIONATE 200 MG/ML IM SOLN
100.0000 mg | Freq: Once | INTRAMUSCULAR | Status: AC
  Administered 2015-07-23: 100 mg via INTRAMUSCULAR

## 2015-07-23 NOTE — Progress Notes (Signed)
Patient was in today receiving a testosterone injection in the left ventrogluteal muscle. Patient tolerated well and supplied his own medication.

## 2015-07-23 NOTE — Progress Notes (Signed)
07/09/2015 09:13 Given 100 mg Intramuscular Right Upper Outer Quadrant Durwin Glaze, CMA Crecencio Mc, MD      NDC: [id number]     Lot#: [id number]     Manufacturer: [employer]     Patient Supplied?: Yes

## 2015-07-24 DIAGNOSIS — J069 Acute upper respiratory infection, unspecified: Secondary | ICD-10-CM | POA: Diagnosis not present

## 2015-08-06 ENCOUNTER — Emergency Department: Payer: PPO

## 2015-08-06 ENCOUNTER — Emergency Department
Admission: EM | Admit: 2015-08-06 | Discharge: 2015-08-06 | Disposition: A | Discharge status: Home / Self Care | Payer: PPO | Attending: Emergency Medicine | Admitting: Emergency Medicine

## 2015-08-06 ENCOUNTER — Encounter: Payer: Self-pay | Admitting: Emergency Medicine

## 2015-08-06 ENCOUNTER — Inpatient Hospital Stay: Payer: PPO | Attending: Internal Medicine

## 2015-08-06 ENCOUNTER — Ambulatory Visit (INDEPENDENT_AMBULATORY_CARE_PROVIDER_SITE_OTHER): Payer: PPO | Admitting: Surgical

## 2015-08-06 DIAGNOSIS — Z79899 Other long term (current) drug therapy: Secondary | ICD-10-CM | POA: Diagnosis not present

## 2015-08-06 DIAGNOSIS — E785 Hyperlipidemia, unspecified: Secondary | ICD-10-CM | POA: Insufficient documentation

## 2015-08-06 DIAGNOSIS — M810 Age-related osteoporosis without current pathological fracture: Secondary | ICD-10-CM | POA: Insufficient documentation

## 2015-08-06 DIAGNOSIS — E291 Testicular hypofunction: Secondary | ICD-10-CM | POA: Insufficient documentation

## 2015-08-06 DIAGNOSIS — R042 Hemoptysis: Secondary | ICD-10-CM | POA: Diagnosis not present

## 2015-08-06 DIAGNOSIS — J8410 Pulmonary fibrosis, unspecified: Secondary | ICD-10-CM | POA: Diagnosis not present

## 2015-08-06 DIAGNOSIS — I270 Primary pulmonary hypertension: Secondary | ICD-10-CM | POA: Insufficient documentation

## 2015-08-06 DIAGNOSIS — Z7982 Long term (current) use of aspirin: Secondary | ICD-10-CM | POA: Diagnosis not present

## 2015-08-06 LAB — COMPREHENSIVE METABOLIC PANEL
ALK PHOS: 56 U/L (ref 38–126)
ALT: 11 U/L — AB (ref 17–63)
AST: 24 U/L (ref 15–41)
Albumin: 4.6 g/dL (ref 3.5–5.0)
Anion gap: 5 (ref 5–15)
BILIRUBIN TOTAL: 1.3 mg/dL — AB (ref 0.3–1.2)
BUN: 12 mg/dL (ref 6–20)
CALCIUM: 9.9 mg/dL (ref 8.9–10.3)
CO2: 31 mmol/L (ref 22–32)
CREATININE: 1.08 mg/dL (ref 0.61–1.24)
Chloride: 96 mmol/L — ABNORMAL LOW (ref 101–111)
GFR, EST NON AFRICAN AMERICAN: 59 mL/min — AB (ref >60)
Glucose, Bld: 108 mg/dL — ABNORMAL HIGH (ref 65–99)
Potassium: 5 mmol/L (ref 3.5–5.1)
Sodium: 132 mmol/L — ABNORMAL LOW (ref 135–145)
TOTAL PROTEIN: 7.2 g/dL (ref 6.5–8.1)

## 2015-08-06 LAB — CBC
HCT: 41.8 % (ref 40.0–52.0)
Hemoglobin: 14.6 g/dL (ref 13.0–18.0)
MCH: 42.1 pg — ABNORMAL HIGH (ref 26.0–34.0)
MCHC: 35 g/dL (ref 32.0–36.0)
MCV: 120.2 fL — AB (ref 80.0–100.0)
PLATELETS: 218 10*3/uL (ref 150–440)
RBC: 3.48 MIL/uL — AB (ref 4.40–5.90)
RDW: 14.9 % — AB (ref 11.5–14.5)
WBC: 5.6 10*3/uL (ref 3.8–10.6)

## 2015-08-06 NOTE — Discharge Instructions (Signed)
Return to the ER for new or worsening blood in her sputum, difficulty breathing, chest pain, feeling like you're going to pass out, fever or for any other concerns.   Hemoptysis Hemoptysis, which means coughing up blood, can be a sign of a minor problem or a serious medical condition. The blood that is coughed up may come from the lungs and airways. Coughed-up blood can also come from bleeding that occurs outside the lungs and airways. Blood can drain into the windpipe during a severe nosebleed or when blood is vomited from the stomach. Because hemoptysis can be a sign of something serious, a medical evaluation is required. For some people with hemoptysis, no definite cause is ever identified. CAUSES  The most common cause of hemoptysis is bronchitis. Some other common causes include:   A ruptured blood vessel caused by coughing or an infection.   A medical condition that causes damage to the large air passageways (bronchiectasis).   A blood clot in the lungs (pulmonary embolism).   Pneumonia.   Tuberculosis.   Breathing in a small foreign object.   Cancer. For some people with hemoptysis, no definite cause is ever identified.  HOME CARE INSTRUCTIONS  Only take over-the-counter or prescription medicines as directed by your caregiver. Do not use cough suppressants unless your caregiver approves.  If your caregiver prescribes antibiotic medicines, take them as directed. Finish them even if you start to feel better.  Do not smoke. Also avoid secondhand smoke.  Follow up with your caregiver as directed. SEEK IMMEDIATE MEDICAL CARE IF:   You cough up bloody mucus for longer than a week.  You have a blood-producing cough that is severe or getting worse.  You have a blood-producing cough thatcomes and goes over time.  You develop problems with your breathing.   You vomit blood.  You develop bloody or black-colored stools.  You have chest pain.   You develop night  sweats.  You feel faint or pass out.   You have a fever or persistent symptoms for more than 2-3 days.  You have a fever and your symptoms suddenly get worse. MAKE SURE YOU:  Understand these instructions.  Will watch your condition.  Will get help right away if you are not doing well or get worse.   This information is not intended to replace advice given to you by your health care provider. Make sure you discuss any questions you have with your health care provider.   Document Released: 04/26/2001 Document Revised: 02/02/2012 Document Reviewed: 12/03/2011 Elsevier Interactive Patient Education [employer].

## 2015-08-06 NOTE — ED Notes (Signed)
Pt presents with coughing up blood three times this am.

## 2015-08-06 NOTE — ED Provider Notes (Signed)
[facility] Emergency Department Provider Note ____________________________________________  Time seen: Approximately 12:20 PM  I have reviewed the triage vital signs and the nursing notes.   HISTORY  Chief Complaint Hematemesis  HPI Daxon Greason is a 82 y.o. male with a history of pulmonary fibrosis, hypertension, thrombocytosis who presents to the ER after he had sputum with a small blood clot around 3 or 4 AM. It happened 2 or 3 times and has not recurred. Patient was treated for a URI with doxycycline for 2 weeks ending a few days ago. His cough has gradually improved but has not yet resolved. He denies any fever, shortness of breath, chest pain, sore throat, or feeling ill.  Past Medical History  Diagnosis Date  . Lung nodule 12/2004    found on CXR  . Essential thrombocytosis ([place])   . Hyperlipidemia   . Hypertension   . Osteoporosis   . Essential thrombocytosis ([place])   . Colon polyps     Patient Active Problem List   Diagnosis Date Noted  . Testicular insufficiency 08/06/2015  . Low serum testosterone 05/20/2015  . Constipation 03/31/2015  . Bilateral hearing loss due to cerumen impaction 03/26/2015  . Pulmonary fibrosis ([place]) 12/12/2014  . Rectal bleeding 12/12/2014  . GI bleed 12/07/2014  . Loss of weight 08/20/2014  . Xerosis of skin 12/18/2013  . Medicare annual wellness visit, subsequent 09/28/2013  . Statin intolerance 09/26/2013  . Pulmonary hypertension, mild ([place]) 12/14/2012  . Rhinitis, nonallergic 12/14/2012  . Solitary pulmonary nodule 10/16/2012  . Heart failure, diastolic, due to HTN ([place]) 10/16/2012  . Bradycardia, sinus 09/02/2012  . Hyponatremia 12/01/2011  . Edema of both legs 10/30/2011  . Essential thrombocytosis ([place])   . Hyperlipidemia   . Hypertension   . Osteoporosis     Past Surgical History  Procedure Laterality Date  . Nasal sinus surgery  06/11/05  . Colonoscopy  2007, 2010    Dr Allen Norris  . Bone marrow  biopsy  01/18/07    Current Outpatient Rx  Name  Route  Sig  Dispense  Refill  . alendronate (FOSAMAX) 70 MG tablet      TAKE ONE TABLET BY MOUTH ONCE A WEEK IN THE MORNING. TAKE WITH WATER 40 MINUTES PRIOR TO ANY FOOD AND REMAIN SITTING UP   12 tablet   4   . amLODipine (NORVASC) 5 MG tablet      TAKE ONE TABLET BY MOUTH ONCE DAILY AS NEEDED FOR BLOOD PRESSURE OVER 160   30 tablet   3   . aspirin EC 81 MG tablet   Oral   Take 81 mg by mouth daily.         . benzonatate (TESSALON) 100 MG capsule   Oral   Take 1 capsule (100 mg total) by mouth 3 (three) times daily as needed for cough.   30 capsule   0   . Calcium Citrate-Vitamin D (CITRACAL + D PO)   Oral   Take 3 tablets by mouth daily. Take 2 by mouth daily, calcium 630 and vitamin D 500 each.         . Cholecalciferol (VITAMIN D3) 1000 UNITS CAPS   Oral   Take 2,000 Units by mouth daily. Take 2 by mouth daily         . hydrALAZINE (APRESOLINE) 50 MG tablet      TAKE ONE TABLET BY MOUTH THREE TIMES DAILY   90 tablet   0   . hydroxyurea (HYDREA)  500 MG capsule      TAKE ONE CAPSULE BY MOUTH ONCE DAILY WITH FOOD TO MINIMIZE GI SIDE EFFECTS   30 capsule   0   . lisinopril (PRINIVIL,ZESTRIL) 40 MG tablet      TAKE ONE TABLET BY MOUTH ONCE DAILY   30 tablet   11   . polyethylene glycol (MIRALAX / GLYCOLAX) packet   Oral   Take 17 g by mouth daily as needed. Reported on 05/19/2015         . testosterone cypionate (DEPOTESTOSTERONE CYPIONATE) 200 MG/ML injection   Intramuscular   Inject 0.5 mLs (100 mg total) into the muscle every 14 (fourteen) days.   10 mL   0     PATIENT TO BRING TO OFFICE TO RECEIVE BIWEEKLY INJ ...     Allergies Amlodipine; Atenolol; Erythromycin; Furosemide; Hctz; Levofloxacin; Metoprolol; Micardis; Penicillins; Terazosin; and Triamterene-hctz  Family History  Problem Relation Age of Onset  . Colon cancer Brother     colon age 34's  . Colon cancer Mother     colon age  80's  . Peripheral vascular disease Father   . Diabetes      Social History Social History  Substance Use Topics  . Smoking status: Never Smoker   . Smokeless tobacco: Never Used  . Alcohol Use: No    Review of Systems Constitutional: No fever/chills Eyes: No visual changes. ENT: No sore throat. Cardiovascular: Denies chest pain. Respiratory: Denies shortness of breath. Gastrointestinal: No abdominal pain.  No nausea, no vomiting.   Musculoskeletal: Negative for back pain. Skin: Negative for rash. Neurological: Negative for headaches, focal weakness or numbness.  10-point ROS otherwise negative.  ____________________________________________   PHYSICAL EXAM:  VITAL SIGNS: ED Triage Vitals  Enc Vitals Group     BP 08/06/15 1003 170/72 mmHg     Pulse Rate 08/06/15 1230 64     Resp 08/06/15 1003 16     Temp --      Temp src --      SpO2 08/06/15 1003 97 %     Weight 08/06/15 1003 160 lb (72.576 kg)     Height 08/06/15 1003 5\' 6"  (1.676 m)     Head Cir --      Peak Flow --      Pain Score --      Pain Loc --      Pain Edu? --      Excl. in [place]? --    Constitutional: Alert and oriented. Well appearing and in no acute distress. Eyes: Conjunctivae are normal. PERRL. EOMI. Head: Atraumatic. Nose: No congestion/rhinnorhea. Mouth/Throat: Mucous membranes are moist.  Oropharynx non-erythematous. Neck: No stridor.   Cardiovascular: Normal rate, regular rhythm. Grossly normal heart sounds.  Good peripheral circulation. Respiratory: Normal respiratory effort.  No retractions. Lungs With coarse breath sounds bilateral bases. Gastrointestinal: Soft and nontender. No distention. No abdominal bruits. No CVA tenderness. Musculoskeletal: No lower extremity tenderness nor edema.   Neurologic:  Normal speech and language. No gross focal neurologic deficits are appreciated. No gait instability. Skin:  Skin is warm, dry and intact. No rash noted. Psychiatric: Mood and affect are  normal. Speech and behavior are normal.  ____________________________________________   LABS (all labs ordered are listed, but only abnormal results are displayed)  Labs Reviewed  COMPREHENSIVE METABOLIC PANEL - Abnormal; Notable for the following:    Sodium 132 (*)    Chloride 96 (*)    Glucose, Bld 108 (*)    ALT  11 (*)    Total Bilirubin 1.3 (*)    GFR calc non Af Amer 59 (*)    All other components within normal limits  CBC - Abnormal; Notable for the following:    RBC 3.48 (*)    MCV 120.2 (*)    MCH 42.1 (*)    RDW 14.9 (*)    All other components within normal limits   ___________________  RADIOLOGY  cxr-IMPRESSION: Evidence of progressed pulmonary fibrosis since 2012. No superimposed acute findings are identified.   Electronically Signed By: Genevie Ann M.D. On: 08/06/2015 11:43 ____________________________________________  ____________________________________________   INITIAL IMPRESSION / ASSESSMENT AND PLAN / ED COURSE  Pertinent labs & imaging results that were available during my care of the patient were reviewed by me and considered in my medical decision making (see chart for details).  Scant hemoptysis likely related to recent URI or pulmonary fibrosis. Patient is stable. Can follow-up with primary care provider.  Filed Vitals:   08/06/15 1003 08/06/15 1230  BP: 170/72 167/63  Pulse:  64  Resp: 16 17    ____________________________________________   FINAL CLINICAL IMPRESSION(S) / ED DIAGNOSES Hemoptysis  New prescriptions started this visit New Prescriptions   No medications on file     Ponciano Ort, MD 08/06/15 1242

## 2015-08-06 NOTE — Progress Notes (Signed)
Patient came in for Testosterone injection given in right dorsal gluteal. Patient tolerated well

## 2015-08-09 ENCOUNTER — Other Ambulatory Visit: Payer: Self-pay | Admitting: Internal Medicine

## 2015-08-14 ENCOUNTER — Encounter: Payer: Self-pay | Admitting: Internal Medicine

## 2015-08-14 ENCOUNTER — Ambulatory Visit (INDEPENDENT_AMBULATORY_CARE_PROVIDER_SITE_OTHER): Payer: PPO | Admitting: Internal Medicine

## 2015-08-14 VITALS — BP 150/74 | HR 72 | Temp 97.9°F | Resp 12 | Ht 66.0 in | Wt 158.5 lb

## 2015-08-14 DIAGNOSIS — R040 Epistaxis: Secondary | ICD-10-CM | POA: Diagnosis not present

## 2015-08-14 DIAGNOSIS — R634 Abnormal weight loss: Secondary | ICD-10-CM | POA: Diagnosis not present

## 2015-08-14 DIAGNOSIS — I10 Essential (primary) hypertension: Secondary | ICD-10-CM

## 2015-08-14 DIAGNOSIS — R5383 Other fatigue: Secondary | ICD-10-CM | POA: Diagnosis not present

## 2015-08-14 DIAGNOSIS — R042 Hemoptysis: Secondary | ICD-10-CM

## 2015-08-14 DIAGNOSIS — E538 Deficiency of other specified B group vitamins: Secondary | ICD-10-CM | POA: Diagnosis not present

## 2015-08-14 LAB — CBC WITH DIFFERENTIAL/PLATELET
BASOS PCT: 0.5 % (ref 0.0–3.0)
Basophils Absolute: 0 10*3/uL (ref 0.0–0.1)
EOS ABS: 0.1 10*3/uL (ref 0.0–0.7)
EOS PCT: 2.3 % (ref 0.0–5.0)
HCT: 42.9 % (ref 39.0–52.0)
Hemoglobin: 14.3 g/dL (ref 13.0–17.0)
LYMPHS ABS: 1.2 10*3/uL (ref 0.7–4.0)
Lymphocytes Relative: 26.4 % (ref 12.0–46.0)
MCHC: 33.5 g/dL (ref 30.0–36.0)
MONO ABS: 0.6 10*3/uL (ref 0.1–1.0)
Monocytes Relative: 13.5 % — ABNORMAL HIGH (ref 3.0–12.0)
NEUTROS PCT: 57.3 % (ref 43.0–77.0)
Neutro Abs: 2.7 10*3/uL (ref 1.4–7.7)
PLATELETS: 260 10*3/uL (ref 150.0–400.0)
RBC: 3.5 Mil/uL — ABNORMAL LOW (ref 4.22–5.81)
RDW: 15.4 % (ref 11.5–15.5)
WBC: 4.7 10*3/uL (ref 4.0–10.5)

## 2015-08-14 LAB — COMPREHENSIVE METABOLIC PANEL
ALBUMIN: 4.4 g/dL (ref 3.5–5.2)
ALT: 8 U/L (ref 0–53)
AST: 15 U/L (ref 0–37)
Alkaline Phosphatase: 60 U/L (ref 39–117)
BUN: 14 mg/dL (ref 6–23)
CHLORIDE: 96 meq/L (ref 96–112)
CO2: 35 mEq/L — ABNORMAL HIGH (ref 19–32)
Calcium: 9.5 mg/dL (ref 8.4–10.5)
Creatinine, Ser: 1.21 mg/dL (ref 0.40–1.50)
GFR: 60.09 mL/min (ref >60.00)
GLUCOSE: 120 mg/dL — AB (ref 70–99)
POTASSIUM: 4.8 meq/L (ref 3.5–5.1)
SODIUM: 132 meq/L — AB (ref 135–145)
Total Bilirubin: 0.9 mg/dL (ref 0.2–1.2)
Total Protein: 6.7 g/dL (ref 6.0–8.3)

## 2015-08-14 LAB — VITAMIN B12: VITAMIN B 12: 164 pg/mL — AB (ref 211–911)

## 2015-08-14 LAB — TSH: TSH: 1.95 u[IU]/mL (ref 0.35–4.50)

## 2015-08-14 MED ORDER — CYANOCOBALAMIN 1000 MCG/ML IJ SOLN
1000.0000 ug | Freq: Once | INTRAMUSCULAR | Status: AC
  Administered 2015-08-14: 1000 ug via INTRAMUSCULAR

## 2015-08-14 MED ORDER — MIRTAZAPINE 15 MG PO TABS: 7.5000 mg | ORAL_TABLET | Freq: Every day | ORAL | Status: DC

## 2015-08-14 NOTE — Patient Instructions (Addendum)
Take the amlodipine every day for your blood pressure  Continue the hydralazine and the lisinopril as well   Referral to ENT for the nosebleeds    I am starting you on Remeron for our low appetite and low energy . Start with 1/2 tablet and increase to full tablet after one week

## 2015-08-14 NOTE — Progress Notes (Signed)
Pre-visit discussion using our clinic review tool. No additional management support is needed unless otherwise documented below in the visit note.  

## 2015-08-14 NOTE — Progress Notes (Signed)
Subjective:  Patient ID: Jimmy Jimenez, male    DOB: 03/13/1925  Age: 82 y.o. MRN: [id number]  CC: The primary encounter diagnosis was Recurrent epistaxis. Diagnoses of B12 nutritional deficiency, Other fatigue, Loss of weight, Essential hypertension, and Hemoptysis were also pertinent to this visit.  HPI Jimmy Jimenez presents for ER follow up for hemoptysis on June 7 .  symptoms occurred in the setting of sinus congestion and occasional epistaxis from the left nostril. He has been treated  2 weeks ago with doxycycline).  A cxr was done to rule out CA .  His chext x ray suggested that his pulmonary fibrosis has progressed since 2012 .  He denies dyspnea at rest but does note decreased appetite and fatigue  aggravated by exertion.  exertional fatigue.   2) Elevated blood pressure with history of intolerance tohHigher dose of norvasc, lasix and maxzide .     Outpatient Prescriptions Prior to Visit  Medication Sig Dispense Refill  . alendronate (FOSAMAX) 70 MG tablet TAKE ONE TABLET BY MOUTH ONCE A WEEK IN THE MORNING. TAKE WITH WATER 40 MINUTES PRIOR TO ANY FOOD AND REMAIN SITTING UP 12 tablet 4  . amLODipine (NORVASC) 5 MG tablet TAKE ONE TABLET BY MOUTH ONCE DAILY AS NEEDED FOR BLOOD PRESSURE OVER 160 30 tablet 3  . Calcium Citrate-Vitamin D (CITRACAL + D PO) Take 3 tablets by mouth daily. Take 2 by mouth daily, calcium 630 and vitamin D 500 each.    . Cholecalciferol (VITAMIN D3) 1000 UNITS CAPS Take 2,000 Units by mouth daily. Take 2 by mouth daily    . hydroxyurea (HYDREA) 500 MG capsule TAKE ONE CAPSULE BY MOUTH ONCE DAILY WITH FOOD TO  MINIMIZE  GI  SIDE  EFFECTS 30 capsule 0  . lisinopril (PRINIVIL,ZESTRIL) 40 MG tablet TAKE ONE TABLET BY MOUTH ONCE DAILY 30 tablet 11  . polyethylene glycol (MIRALAX / GLYCOLAX) packet Take 17 g by mouth daily as needed. Reported on 05/19/2015    . testosterone cypionate (DEPOTESTOSTERONE CYPIONATE) 200 MG/ML injection Inject 0.5 mLs (100 mg total) into the  muscle every 14 (fourteen) days. 10 mL 0  . hydrALAZINE (APRESOLINE) 50 MG tablet TAKE ONE TABLET BY MOUTH THREE TIMES DAILY 90 tablet 0  . aspirin EC 81 MG tablet Take 81 mg by mouth daily. Reported on 08/14/2015    . benzonatate (TESSALON) 100 MG capsule Take 1 capsule (100 mg total) by mouth 3 (three) times daily as needed for cough. (Patient not taking: Reported on 08/14/2015) 30 capsule 0   Facility-Administered Medications Prior to Visit  Medication Dose Route Frequency Provider Last Rate Last Dose  . testosterone cypionate (DEPOTESTOSTERONE CYPIONATE) injection 100 mg  100 mg Intramuscular Q14 Days Crecencio Mc, MD   100 mg at 08/06/15 0940  . testosterone cypionate (DEPOTESTOTERONE CYPIONATE) injection 100 mg  100 mg Intramuscular Q14 Days Crecencio Mc, MD   100 mg at 06/11/15 0910  . testosterone cypionate (DEPOTESTOTERONE CYPIONATE) injection 100 mg  100 mg Intramuscular Q14 Days Crecencio Mc, MD   100 mg at 06/25/15 1440    Review of Systems;  Patient denies headache, fevers, malaise, unintentional weight loss, skin rash, eye pain, sinus congestion and sinus pain, sore throat, dysphagia,  hemoptysis , cough, dyspnea, wheezing, chest pain, palpitations, orthopnea, edema, abdominal pain, nausea, melena, diarrhea, constipation, flank pain, dysuria, hematuria, urinary  Frequency, nocturia, numbness, tingling, seizures,  Focal weakness, Loss of consciousness,  Tremor, insomnia, depression, anxiety, and suicidal ideation.  Objective:  BP 150/74 mmHg  Pulse 72  Temp(Src) 97.9 F (36.6 C) (Oral)  Resp 12  Ht 5\' 6"  (1.676 m)  Wt 158 lb 8 oz (71.895 kg)  BMI 25.59 kg/m2  SpO2 96%  BP Readings from Last 3 Encounters:  08/14/15 150/74  08/06/15 158/100  05/19/15 168/68    Wt Readings from Last 3 Encounters:  08/14/15 158 lb 8 oz (71.895 kg)  08/06/15 160 lb (72.576 kg)  05/19/15 162 lb 4 oz (73.596 kg)    General appearance: alert, cooperative and appears stated  age Ears: normal TM's and external ear canals both ears Throat: lips, mucosa, and tongue normal; teeth and gums normal Neck: no adenopathy, no carotid bruit, supple, symmetrical, trachea midline and thyroid not enlarged, symmetric, no tenderness/mass/nodules Back: symmetric, no curvature. ROM normal. No CVA tenderness. Lungs: clear to auscultation bilaterally Heart: regular rate and rhythm, S1, S2 normal, no murmur, click, rub or gallop Abdomen: soft, non-tender; bowel sounds normal; no masses,  no organomegaly Pulses: 2+ and symmetric Skin: Skin color, texture, turgor normal. No rashes or lesions Lymph nodes: Cervical, supraclavicular, and axillary nodes normal.  No results found for: HGBA1C  Lab Results  Component Value Date   CREATININE 1.21 08/14/2015   CREATININE 1.08 08/06/2015   CREATININE 1.08 04/14/2015    Lab Results  Component Value Date   WBC 4.7 08/14/2015   HGB 14.3 08/14/2015   HCT 42.9 08/14/2015   PLT 260.0 08/14/2015   GLUCOSE 120* 08/14/2015   LDLDIRECT 103.7 09/26/2013   ALT 8 08/14/2015   AST 15 08/14/2015   NA 132* 08/14/2015   K 4.8 08/14/2015   CL 96 08/14/2015   CREATININE 1.21 08/14/2015   BUN 14 08/14/2015   CO2 35* 08/14/2015   TSH 1.95 08/14/2015   PSA 0.88 08/19/2014   MICROALBUR 0.7 08/30/2012    Dg Chest 2 View  08/06/2015  CLINICAL DATA:  82 year old male with hemoptysis since this morning. Initial encounter. Pulmonary fibrosis. EXAM: CHEST  2 VIEW COMPARISON:  Chest CT 04/24/2010 and earlier. FINDINGS: Progressed peripheral reticular pulmonary opacity since 2012. Stable lung volumes since that time. Mediastinal contours are stable and within normal limits. Visualized tracheal air column is within normal limits. No pneumothorax or pulmonary edema. No definite pleural effusion. No consolidation or definite superimposed acute pulmonary opacity. Osteopenia. Calcified aortic atherosclerosis. IMPRESSION: Evidence of progressed pulmonary fibrosis  since 2012. No superimposed acute findings are identified. Electronically Signed   By: Genevie Ann M.D.   On: 08/06/2015 11:43    Assessment & Plan:   Problem List Items Addressed This Visit    Hypertension    .BP 150/74 mmHg  Pulse 72  Temp(Src) 97.9 F (36.6 C) (Oral)  Resp 12  Ht 5\' 6"  (1.676 m)  Wt 158 lb 8 oz (71.895 kg)  BMI 25.59 kg/m2  SpO2 96% Optimal control has been difficult due to multiple intolerances.  He has been using amlodipien prn,  Will resume daily use of 5 mg .         Loss of weight    Secondary to fatigue and mild depression.  Trial of remeron .       Hemoptysis    Attributed by ER physician to pulmonary fibrosis,  But patient reports that he has had some sinus drainage that was bloody.  Referral to ENT        Other Visit Diagnoses    Recurrent epistaxis    -  Primary  Relevant Orders    Ambulatory referral to ENT    B12 nutritional deficiency        Relevant Medications    cyanocobalamin ((VITAMIN B-12)) injection 1,000 mcg (Completed)    Other Relevant Orders    Vitamin B12 (Completed)    Other fatigue        Relevant Orders    Comprehensive metabolic panel (Completed)    CBC with Differential/Platelet (Completed)    TSH (Completed)      A total of 25 minutes of face to face time was spent with patient more than half of which was spent in counselling about the above mentioned conditions  and coordination of care  I am having Mr. Everts start on mirtazapine. I am also having him maintain his Calcium Citrate-Vitamin D (CITRACAL + D PO), Vitamin D3, alendronate, aspirin EC, polyethylene glycol, lisinopril, amLODipine, benzonatate, testosterone cypionate, and hydroxyurea. We administered cyanocobalamin. We will continue to administer testosterone cypionate, testosterone cypionate, and testosterone cypionate.  Meds ordered this encounter  Medications  . mirtazapine (REMERON) 15 MG tablet    Sig: Take 0.5 tablets (7.5 mg total) by mouth at  bedtime. increase to 1 tablet after one week    Dispense:  30 tablet    Refill:  2  . cyanocobalamin ((VITAMIN B-12)) injection 1,000 mcg    Sig:     There are no discontinued medications.  Follow-up: Return in about 4 weeks (around 09/11/2015).   Crecencio Mc, MD

## 2015-08-15 ENCOUNTER — Other Ambulatory Visit: Payer: Self-pay | Admitting: Internal Medicine

## 2015-08-17 ENCOUNTER — Encounter: Payer: Self-pay | Admitting: Internal Medicine

## 2015-08-17 DIAGNOSIS — R042 Hemoptysis: Secondary | ICD-10-CM | POA: Insufficient documentation

## 2015-08-17 DIAGNOSIS — E538 Deficiency of other specified B group vitamins: Secondary | ICD-10-CM | POA: Insufficient documentation

## 2015-08-17 NOTE — Assessment & Plan Note (Addendum)
.  BP 150/74 mmHg  Pulse 72  Temp(Src) 97.9 F (36.6 C) (Oral)  Resp 12  Ht 5\' 6"  (1.676 m)  Wt 158 lb 8 oz (71.895 kg)  BMI 25.59 kg/m2  SpO2 96% Optimal control has been difficult due to multiple intolerances.  He has been using amlodipien prn,  Will resume daily use of 5 mg .

## 2015-08-17 NOTE — Assessment & Plan Note (Signed)
Secondary to fatigue and mild depression.  Trial of remeron .

## 2015-08-17 NOTE — Assessment & Plan Note (Signed)
Attributed by ER physician to pulmonary fibrosis,  But patient reports that he has had some sinus drainage that was bloody.  Referral to ENT

## 2015-08-18 ENCOUNTER — Encounter: Payer: Self-pay | Admitting: Internal Medicine

## 2015-08-19 ENCOUNTER — Ambulatory Visit (INDEPENDENT_AMBULATORY_CARE_PROVIDER_SITE_OTHER): Payer: PPO

## 2015-08-19 DIAGNOSIS — E538 Deficiency of other specified B group vitamins: Secondary | ICD-10-CM | POA: Diagnosis not present

## 2015-08-19 MED ORDER — CYANOCOBALAMIN 1000 MCG/ML IJ SOLN
1000.0000 ug | Freq: Once | INTRAMUSCULAR | Status: AC
  Administered 2015-08-19: 1000 ug via INTRAMUSCULAR

## 2015-08-19 NOTE — Progress Notes (Signed)
Patient came in for B12 injection.  Received in right deltoid.  Patient tolerated well.

## 2015-08-20 ENCOUNTER — Ambulatory Visit (INDEPENDENT_AMBULATORY_CARE_PROVIDER_SITE_OTHER): Payer: PPO

## 2015-08-20 DIAGNOSIS — E538 Deficiency of other specified B group vitamins: Secondary | ICD-10-CM

## 2015-08-20 DIAGNOSIS — E291 Testicular hypofunction: Secondary | ICD-10-CM | POA: Diagnosis not present

## 2015-08-20 MED ORDER — CYANOCOBALAMIN 1000 MCG/ML IJ SOLN
1000.0000 ug | Freq: Once | INTRAMUSCULAR | Status: AC
  Administered 2015-08-20: 1000 ug via INTRAMUSCULAR

## 2015-08-20 NOTE — Progress Notes (Signed)
Patient came in for both B12 and testosterone injections.  See details in the [facility].  Received the B12 in Right deltoid.  Received the Testosterone in the Left dorsal gluteal.  Patient tolerated both well.

## 2015-08-21 ENCOUNTER — Ambulatory Visit (INDEPENDENT_AMBULATORY_CARE_PROVIDER_SITE_OTHER): Payer: PPO | Admitting: *Deleted

## 2015-08-21 DIAGNOSIS — E538 Deficiency of other specified B group vitamins: Secondary | ICD-10-CM | POA: Diagnosis not present

## 2015-08-21 MED ORDER — CYANOCOBALAMIN 1000 MCG/ML IJ SOLN
1000.0000 ug | Freq: Once | INTRAMUSCULAR | Status: AC
  Administered 2015-08-21: 1000 ug via INTRAMUSCULAR

## 2015-08-22 ENCOUNTER — Ambulatory Visit (INDEPENDENT_AMBULATORY_CARE_PROVIDER_SITE_OTHER): Payer: PPO | Admitting: *Deleted

## 2015-08-22 DIAGNOSIS — E538 Deficiency of other specified B group vitamins: Secondary | ICD-10-CM

## 2015-08-22 MED ORDER — CYANOCOBALAMIN 1000 MCG/ML IJ SOLN
1000.0000 ug | Freq: Once | INTRAMUSCULAR | Status: AC
  Administered 2015-08-22: 1000 ug via INTRAMUSCULAR

## 2015-08-26 ENCOUNTER — Ambulatory Visit: Payer: PPO

## 2015-08-28 ENCOUNTER — Ambulatory Visit (INDEPENDENT_AMBULATORY_CARE_PROVIDER_SITE_OTHER): Payer: PPO

## 2015-08-28 DIAGNOSIS — E538 Deficiency of other specified B group vitamins: Secondary | ICD-10-CM

## 2015-08-28 MED ORDER — CYANOCOBALAMIN 1000 MCG/ML IJ SOLN
1000.0000 ug | Freq: Once | INTRAMUSCULAR | Status: AC
  Administered 2015-08-28: 1000 ug via INTRAMUSCULAR

## 2015-08-28 NOTE — Progress Notes (Signed)
Patient came in for b12 injection.  Received in Left deltoid.  Patient tolerated well  

## 2015-09-03 ENCOUNTER — Ambulatory Visit (INDEPENDENT_AMBULATORY_CARE_PROVIDER_SITE_OTHER): Payer: PPO

## 2015-09-03 DIAGNOSIS — E538 Deficiency of other specified B group vitamins: Secondary | ICD-10-CM

## 2015-09-03 MED ORDER — CYANOCOBALAMIN 1000 MCG/ML IJ SOLN
1000.0000 ug | Freq: Once | INTRAMUSCULAR | Status: AC
  Administered 2015-09-03: 1000 ug via INTRAMUSCULAR

## 2015-09-03 NOTE — Progress Notes (Signed)
Patient came in for both testosterone and b12 injections.  Received both, documented, on MAR.  Patient tolerated well.

## 2015-09-04 ENCOUNTER — Ambulatory Visit: Payer: PPO

## 2015-09-04 ENCOUNTER — Other Ambulatory Visit: Payer: Self-pay | Admitting: Internal Medicine

## 2015-09-09 ENCOUNTER — Other Ambulatory Visit: Payer: Self-pay

## 2015-09-09 MED ORDER — HYDROXYUREA 500 MG PO CAPS: ORAL_CAPSULE | ORAL | Status: DC

## 2015-09-09 NOTE — Telephone Encounter (Signed)
Patient's wife called to check on status of Hydrea refill.  Contacted patients pharmacy and Rx had been filled.  Called patients wife and she was going to pick up Rx

## 2015-09-17 ENCOUNTER — Ambulatory Visit (INDEPENDENT_AMBULATORY_CARE_PROVIDER_SITE_OTHER): Payer: PPO

## 2015-09-17 DIAGNOSIS — E291 Testicular hypofunction: Secondary | ICD-10-CM | POA: Diagnosis not present

## 2015-09-17 DIAGNOSIS — R7989 Other specified abnormal findings of blood chemistry: Secondary | ICD-10-CM

## 2015-09-17 NOTE — Progress Notes (Signed)
Patient came in for testosterone injection received in Left upper outer quadrant.  Patient tolerated well, thanks

## 2015-10-01 ENCOUNTER — Ambulatory Visit (INDEPENDENT_AMBULATORY_CARE_PROVIDER_SITE_OTHER): Payer: PPO

## 2015-10-01 DIAGNOSIS — E291 Testicular hypofunction: Secondary | ICD-10-CM | POA: Diagnosis not present

## 2015-10-01 DIAGNOSIS — R7989 Other specified abnormal findings of blood chemistry: Secondary | ICD-10-CM

## 2015-10-01 NOTE — Progress Notes (Signed)
Patient came in for testosterone injection.  Received in Right upper outer quadrant.

## 2015-10-13 ENCOUNTER — Inpatient Hospital Stay: Payer: PPO

## 2015-10-13 ENCOUNTER — Inpatient Hospital Stay: Payer: PPO | Attending: Internal Medicine | Admitting: Internal Medicine

## 2015-10-13 DIAGNOSIS — Z800 Family history of malignant neoplasm of digestive organs: Secondary | ICD-10-CM | POA: Insufficient documentation

## 2015-10-13 DIAGNOSIS — Z79899 Other long term (current) drug therapy: Secondary | ICD-10-CM | POA: Diagnosis not present

## 2015-10-13 DIAGNOSIS — D473 Essential (hemorrhagic) thrombocythemia: Secondary | ICD-10-CM | POA: Insufficient documentation

## 2015-10-13 DIAGNOSIS — M818 Other osteoporosis without current pathological fracture: Secondary | ICD-10-CM

## 2015-10-13 DIAGNOSIS — E785 Hyperlipidemia, unspecified: Secondary | ICD-10-CM | POA: Diagnosis not present

## 2015-10-13 DIAGNOSIS — I10 Essential (primary) hypertension: Secondary | ICD-10-CM | POA: Diagnosis not present

## 2015-10-13 DIAGNOSIS — Z86010 Personal history of colonic polyps: Secondary | ICD-10-CM | POA: Insufficient documentation

## 2015-10-13 DIAGNOSIS — R918 Other nonspecific abnormal finding of lung field: Secondary | ICD-10-CM | POA: Diagnosis not present

## 2015-10-13 DIAGNOSIS — Z7982 Long term (current) use of aspirin: Secondary | ICD-10-CM | POA: Diagnosis not present

## 2015-10-13 LAB — CBC WITH DIFFERENTIAL/PLATELET
BASOS ABS: 0 10*3/uL (ref 0–0.1)
Basophils Relative: 1 %
EOS PCT: 1 %
Eosinophils Absolute: 0.1 10*3/uL (ref 0–0.7)
HCT: 42 % (ref 40.0–52.0)
HEMOGLOBIN: 14.5 g/dL (ref 13.0–18.0)
LYMPHS ABS: 1 10*3/uL (ref 1.0–3.6)
LYMPHS PCT: 17 %
MCH: 40.8 pg — AB (ref 26.0–34.0)
MCHC: 34.4 g/dL (ref 32.0–36.0)
MCV: 118.5 fL — AB (ref 80.0–100.0)
Monocytes Absolute: 0.6 10*3/uL (ref 0.2–1.0)
Monocytes Relative: 10 %
NEUTROS PCT: 71 %
Neutro Abs: 4.3 10*3/uL (ref 1.4–6.5)
PLATELETS: 239 10*3/uL (ref 150–440)
RBC: 3.55 MIL/uL — AB (ref 4.40–5.90)
RDW: 12.3 % (ref 11.5–14.5)
WBC: 6 10*3/uL (ref 3.8–10.6)

## 2015-10-13 LAB — COMPREHENSIVE METABOLIC PANEL
ALBUMIN: 4.3 g/dL (ref 3.5–5.0)
ALT: 9 U/L — AB (ref 17–63)
AST: 18 U/L (ref 15–41)
Alkaline Phosphatase: 59 U/L (ref 38–126)
Anion gap: 4 — ABNORMAL LOW (ref 5–15)
BUN: 17 mg/dL (ref 6–20)
CHLORIDE: 99 mmol/L — AB (ref 101–111)
CO2: 31 mmol/L (ref 22–32)
CREATININE: 1.2 mg/dL (ref 0.61–1.24)
Calcium: 8.9 mg/dL (ref 8.9–10.3)
GFR calc Af Amer: >60 mL/min (ref >60)
GFR, EST NON AFRICAN AMERICAN: 52 mL/min — AB (ref >60)
GLUCOSE: 98 mg/dL (ref 65–99)
POTASSIUM: 3.8 mmol/L (ref 3.5–5.1)
SODIUM: 134 mmol/L — AB (ref 135–145)
Total Bilirubin: 1.1 mg/dL (ref 0.3–1.2)
Total Protein: 7.1 g/dL (ref 6.5–8.1)

## 2015-10-13 NOTE — Progress Notes (Signed)
[facility] OFFICE PROGRESS NOTE  Patient Care Team: Crecencio Mc, MD as PCP - General (Internal Medicine) Robert Bellow, MD as Consulting Physician (General Surgery) Crecencio Mc, MD as Referring Physician (Internal Medicine) Leia Alf, MD (Inactive) as Medical Oncologist (Internal Medicine)   SUMMARY OF HEMATOLOGIC HISTORY:  # 2008- ESSENTIAL THROMBOCYTOSIS- Hydrea 520m/d; NOV 2008- BMBx- slight increase in enlarged megakayocytes; bcr-abl-Neg;Jak-2 NEG   # previous TIA   INTERVAL HISTORY:  82 year old male patient with above history of essential thrombocytosis on Hydrea is here for follow-up. Patient denies any recent blood clots or any strokes or TIAs. Denies any burning pain in his extremities.  No weight loss no night sweats or any fevers. Patient is remarkably active for his age. No recent hospitalizations.  REVIEW OF SYSTEMS:  A complete 10 point review of system is done which is negative except mentioned above/history of present illness.   PAST MEDICAL HISTORY :  Past Medical History:  Diagnosis Date  . Colon polyps   . [place]ential [place] ([facility]   . [place]   . Hyperlipidemia   . Hypertension   . Lung nodule 12/2004   found on CXR  . Osteoporosis     PAST SURGICAL HISTORY :   Past Surgical History:  Procedure Laterality Date  . BONE MARROW BIOPSY  01/18/07  . COLONOSCOPY  2007, 2010   Dr WAllen Norris . NASAL SINUS SURGERY  06/11/05    FAMILY HISTORY :   Family History  Problem Relation Age of Onset  . Colon cancer Brother     colon age 58109's . Colon cancer Mother     colon age 80's . Peripheral vascular disease Father   . Diabetes      SOCIAL HISTORY:   Social History  Substance Use Topics  . Smoking status: Never Smoker  . Smokeless tobacco: Never Used  . Alcohol use No    ALLERGIES:  is allergic to amlodipine; atenolol; erythromycin; furosemide; hctz [hydrochlorothiazide]; levofloxacin;  metoprolol; micardis [telmisartan]; penicillins; terazosin; and triamterene-hctz.  MEDICATIONS:  Current Outpatient Prescriptions  Medication Sig Dispense Refill  . alendronate (FOSAMAX) 70 MG tablet TAKE ONE TABLET BY MOUTH ONCE A WEEK IN THE MORNING. TAKE WITH WATER 40 MINUTES PRIOR TO ANY FOOD AND REMAIN SITTING UP 12 tablet 4  . amLODipine (NORVASC) 5 MG tablet TAKE ONE TABLET BY MOUTH ONCE DAILY AS NEEDED FOR BLOOD PRESSURE OVER 160 30 tablet 3  . aspirin EC 81 MG tablet Take 81 mg by mouth daily. Reported on 08/14/2015    . benzonatate (TESSALON) 100 MG capsule Take 1 capsule (100 mg total) by mouth 3 (three) times daily as needed for cough. 30 capsule 0  . Calcium Citrate-Vitamin D (CITRACAL + D PO) Take 3 tablets by mouth daily. Take 2 by mouth daily, calcium 630 and vitamin D 500 each.    . Cholecalciferol (VITAMIN D3) 1000 UNITS CAPS Take 2,000 Units by mouth daily. Take 2 by mouth daily    . hydrALAZINE (APRESOLINE) 50 MG tablet TAKE ONE TABLET BY MOUTH THREE TIMES DAILY 90 tablet 3  . hydroxyurea (HYDREA) 500 MG capsule TAKE ONE CAPSULE BY MOUTH ONCE DAILY WITH FOOD TO  MINIMIZE  GI  SIDE  EFFECTS 30 capsule 2  . lisinopril (PRINIVIL,ZESTRIL) 40 MG tablet TAKE ONE TABLET BY MOUTH ONCE DAILY 30 tablet 11  . mirtazapine (REMERON) 15 MG tablet Take 0.5 tablets (7.5 mg total) by mouth at bedtime. increase to 1  tablet after one week 30 tablet 2  . polyethylene glycol (MIRALAX / GLYCOLAX) packet Take 17 g by mouth daily as needed. Reported on 05/19/2015    . testosterone cypionate (DEPOTESTOSTERONE CYPIONATE) 200 MG/ML injection Inject 0.5 mLs (100 mg total) into the muscle every 14 (fourteen) days. 10 mL 0   Current Facility-Administered Medications  Medication Dose Route Frequency Provider Last Rate Last Dose  . testosterone cypionate (DEPOTESTOTERONE CYPIONATE) injection 100 mg  100 mg Intramuscular Q14 Days Crecencio Mc, MD   100 mg at 10/01/15 0951    PHYSICAL EXAMINATION:   BP  (!) 172/71 (BP Location: Left Arm, Patient Position: Sitting)   Pulse 73   Temp (!) 96.4 F (35.8 C) (Tympanic)   Resp 18   Wt 156 lb (70.8 kg)   BMI 25.18 kg/m   Filed Weights   10/13/15 1025  Weight: 156 lb (70.8 kg)    GENERAL: Well-nourished well-developed; Alert, no distress and comfortable. Accompanied by his wife. EYES: no pallor or icterus OROPHARYNX: no thrush or ulceration; good dentition  NECK: supple, no masses felt LYMPH:  no palpable lymphadenopathy in the cervical, axillary or inguinal regions LUNGS: clear to auscultation and  No wheeze or crackles HEART/CVS: regular rate & rhythm and no murmurs; No lower extremity edema ABDOMEN:abdomen soft, non-tender and normal bowel sounds Musculoskeletal:no cyanosis of digits and no clubbing  PSYCH: alert & oriented x 3 with fluent speech NEURO: no focal motor/sensory deficits SKIN:  no rashes or significant lesions  LABORATORY DATA:  I have reviewed the data as listed    Component Value Date/Time   NA 134 (L) 10/13/2015 0937   NA 136 05/19/2011 1049   K 3.8 10/13/2015 0937   K 4.3 05/19/2011 1049   CL 99 (L) 10/13/2015 0937   CL 99 05/19/2011 1049   CO2 31 10/13/2015 0937   CO2 32 05/19/2011 1049   GLUCOSE 98 10/13/2015 0937   GLUCOSE 116 (H) 05/19/2011 1049   BUN 17 10/13/2015 0937   BUN 8 05/19/2011 1049   CREATININE 1.20 10/13/2015 0937   CREATININE 1.15 12/24/2013 0921   CALCIUM 8.9 10/13/2015 0937   CALCIUM 9.0 05/19/2011 1049   PROT 7.1 10/13/2015 0937   PROT 6.8 12/24/2013 0921   ALBUMIN 4.3 10/13/2015 0937   ALBUMIN 4.0 12/24/2013 0921   AST 18 10/13/2015 0937   AST 23 12/24/2013 0921   ALT 9 (L) 10/13/2015 0937   ALT 19 12/24/2013 0921   ALKPHOS 59 10/13/2015 0937   ALKPHOS 57 12/24/2013 0921   BILITOT 1.1 10/13/2015 0937   BILITOT 0.9 12/24/2013 0921   GFRNONAA 52 (L) 10/13/2015 0937   GFRNONAA >60 12/24/2013 0921   GFRNONAA 50 (L) 09/03/2013 0930   GFRAA >60 10/13/2015 0937   GFRAA >60  12/24/2013 0921   GFRAA 58 (L) 09/03/2013 0930    No results found for: SPEP, UPEP  Lab Results  Component Value Date   WBC 6.0 10/13/2015   NEUTROABS 4.3 10/13/2015   HGB 14.5 10/13/2015   HCT 42.0 10/13/2015   MCV 118.5 (H) 10/13/2015   PLT 239 10/13/2015      Chemistry      Component Value Date/Time   NA 134 (L) 10/13/2015 0937   NA 136 05/19/2011 1049   K 3.8 10/13/2015 0937   K 4.3 05/19/2011 1049   CL 99 (L) 10/13/2015 0937   CL 99 05/19/2011 1049   CO2 31 10/13/2015 0937   CO2 32 05/19/2011 1049  BUN 17 10/13/2015 0937   BUN 8 05/19/2011 1049   CREATININE 1.20 10/13/2015 0937   CREATININE 1.15 12/24/2013 0921      Component Value Date/Time   CALCIUM 8.9 10/13/2015 0937   CALCIUM 9.0 05/19/2011 1049   ALKPHOS 59 10/13/2015 0937   ALKPHOS 57 12/24/2013 0921   AST 18 10/13/2015 0937   AST 23 12/24/2013 0921   ALT 9 (L) 10/13/2015 0937   ALT 19 12/24/2013 0921   BILITOT 1.1 10/13/2015 0937   BILITOT 0.9 12/24/2013 0921       RADIOGRAPHIC STUDIES: I have personally reviewed the radiological images as listed and agreed with the findings in the report. No results found.   ASSESSMENT & PLAN:   Essential thrombocytosis ([place]) # Essential thrombocytosis- on Hydrea-500 mg once a day. Today CBC within normal limits-platelet count 239/macrocytosis from Hydrea. Creatinine stable are LFTs normal. No other side effects noted. Continue Hydrea 500 mg once a day.  # awaiting on MPL/CALR mutation from today.   # Check CBC CMP/follow-up in 6 months. Will call for refills.        Cammie Sickle, MD 10/14/2015 8:12 AM

## 2015-10-13 NOTE — Assessment & Plan Note (Addendum)
#   Essential thrombocytosis- on Hydrea-500 mg once a day. Today CBC within normal limits-platelet count 239/macrocytosis from Hydrea. Creatinine stable are LFTs normal. No other side effects noted. Continue Hydrea 500 mg once a day.  # awaiting on MPL/CALR mutation from today.   # Check CBC CMP/follow-up in 6 months. Will call for refills.

## 2015-10-15 ENCOUNTER — Ambulatory Visit (INDEPENDENT_AMBULATORY_CARE_PROVIDER_SITE_OTHER): Payer: PPO

## 2015-10-15 DIAGNOSIS — E291 Testicular hypofunction: Secondary | ICD-10-CM

## 2015-10-15 DIAGNOSIS — R7989 Other specified abnormal findings of blood chemistry: Secondary | ICD-10-CM

## 2015-10-15 NOTE — Progress Notes (Signed)
Patient came in for testosterone injection.  Patient received in Left upper outer Quadrant.  Patient tolerated well. thanks

## 2015-10-17 LAB — CALRETICULIN (CALR) MUTATION ANALYSIS

## 2015-10-20 LAB — MPL MUTATION ANALYSIS

## 2015-10-20 NOTE — Progress Notes (Signed)
  I have reviewed the above information and agree with above.   Haruo Stepanek, MD 

## 2015-10-29 ENCOUNTER — Ambulatory Visit (INDEPENDENT_AMBULATORY_CARE_PROVIDER_SITE_OTHER): Payer: PPO

## 2015-10-29 DIAGNOSIS — E291 Testicular hypofunction: Secondary | ICD-10-CM

## 2015-10-29 DIAGNOSIS — R7989 Other specified abnormal findings of blood chemistry: Secondary | ICD-10-CM

## 2015-10-29 NOTE — Progress Notes (Signed)
Patient came in for Testosterone injection.  Received in Right upper outer quadrant.  Patient tolerated well.

## 2015-11-12 ENCOUNTER — Ambulatory Visit: Payer: PPO

## 2015-11-12 ENCOUNTER — Ambulatory Visit (INDEPENDENT_AMBULATORY_CARE_PROVIDER_SITE_OTHER): Payer: PPO

## 2015-11-12 DIAGNOSIS — R7989 Other specified abnormal findings of blood chemistry: Secondary | ICD-10-CM

## 2015-11-12 DIAGNOSIS — E291 Testicular hypofunction: Secondary | ICD-10-CM

## 2015-11-12 NOTE — Progress Notes (Signed)
Patient came in for testosterone, brought his own meds. Received in Left upper quadrant. Tolerated well. thanks

## 2015-11-17 ENCOUNTER — Other Ambulatory Visit: Payer: Self-pay | Admitting: Internal Medicine

## 2015-11-17 NOTE — Telephone Encounter (Signed)
Please advise OK to fill medication not showing as current.

## 2015-11-19 DIAGNOSIS — R21 Rash and other nonspecific skin eruption: Secondary | ICD-10-CM | POA: Diagnosis not present

## 2015-11-22 NOTE — Progress Notes (Signed)
  I have reviewed the above information and agree with above.   Savaughn Karwowski, MD 

## 2015-11-26 ENCOUNTER — Encounter (INDEPENDENT_AMBULATORY_CARE_PROVIDER_SITE_OTHER): Payer: Self-pay

## 2015-11-26 ENCOUNTER — Ambulatory Visit (INDEPENDENT_AMBULATORY_CARE_PROVIDER_SITE_OTHER): Payer: PPO

## 2015-11-26 DIAGNOSIS — E291 Testicular hypofunction: Secondary | ICD-10-CM

## 2015-11-26 DIAGNOSIS — R7989 Other specified abnormal findings of blood chemistry: Secondary | ICD-10-CM

## 2015-11-26 NOTE — Progress Notes (Signed)
Patient came in for testosterone injection in his Right Glute.  Patient tolerated shot very well had no questions, comments, or concerns.

## 2015-12-03 NOTE — Progress Notes (Signed)
  I have reviewed the above information and agree with above.   Barrington Worley, MD 

## 2015-12-10 ENCOUNTER — Ambulatory Visit (INDEPENDENT_AMBULATORY_CARE_PROVIDER_SITE_OTHER): Payer: PPO

## 2015-12-10 DIAGNOSIS — R7989 Other specified abnormal findings of blood chemistry: Secondary | ICD-10-CM

## 2015-12-10 DIAGNOSIS — E291 Testicular hypofunction: Secondary | ICD-10-CM

## 2015-12-10 NOTE — Progress Notes (Signed)
Patient came in for testosterone injection.  Received in Left gluteal. Patient tolerated well.

## 2015-12-11 DIAGNOSIS — J069 Acute upper respiratory infection, unspecified: Secondary | ICD-10-CM | POA: Diagnosis not present

## 2015-12-15 ENCOUNTER — Other Ambulatory Visit: Payer: Self-pay | Admitting: Internal Medicine

## 2015-12-24 ENCOUNTER — Ambulatory Visit (INDEPENDENT_AMBULATORY_CARE_PROVIDER_SITE_OTHER): Payer: PPO

## 2015-12-24 ENCOUNTER — Ambulatory Visit: Payer: PPO

## 2015-12-24 DIAGNOSIS — E349 Endocrine disorder, unspecified: Secondary | ICD-10-CM

## 2015-12-24 MED ORDER — TESTOSTERONE CYPIONATE 100 MG/ML IM SOLN
100.0000 mg | INTRAMUSCULAR | Status: DC
  Administered 2015-12-24 – 2016-03-22 (×6): 100 mg via INTRAMUSCULAR

## 2015-12-24 NOTE — Progress Notes (Signed)
Patient comes in for testosterone injection.  Injected right upper quadrant patient tolerated injection well.

## 2015-12-30 NOTE — Progress Notes (Signed)
  I have reviewed the above information and agree with above.   Nathalya Wolanski, MD 

## 2015-12-30 NOTE — Progress Notes (Signed)
  I have reviewed the above information and agree with above.   Nobel Brar, MD 

## 2016-01-07 ENCOUNTER — Ambulatory Visit (INDEPENDENT_AMBULATORY_CARE_PROVIDER_SITE_OTHER): Payer: PPO

## 2016-01-07 DIAGNOSIS — E349 Endocrine disorder, unspecified: Secondary | ICD-10-CM

## 2016-01-07 NOTE — Progress Notes (Signed)
   Patient came in for a testosterone injection.  Received in Left upper outer quadrant.  Patient tolerated well.

## 2016-01-11 NOTE — Progress Notes (Signed)
  I have reviewed the above information and agree with above.   Melenda Bielak, MD 

## 2016-01-13 ENCOUNTER — Other Ambulatory Visit: Payer: Self-pay | Admitting: Internal Medicine

## 2016-01-21 ENCOUNTER — Ambulatory Visit: Payer: PPO

## 2016-01-21 ENCOUNTER — Ambulatory Visit (INDEPENDENT_AMBULATORY_CARE_PROVIDER_SITE_OTHER): Payer: PPO

## 2016-01-21 DIAGNOSIS — E349 Endocrine disorder, unspecified: Secondary | ICD-10-CM | POA: Diagnosis not present

## 2016-01-21 NOTE — Progress Notes (Signed)
Patient comes in for testosterone injection.  Injected left upper outer quadrant.     Patient tolerated injection well.

## 2016-01-26 ENCOUNTER — Ambulatory Visit: Payer: PPO

## 2016-02-02 ENCOUNTER — Other Ambulatory Visit: Payer: Self-pay | Admitting: Internal Medicine

## 2016-02-03 ENCOUNTER — Telehealth: Payer: Self-pay | Admitting: *Deleted

## 2016-02-03 DIAGNOSIS — R05 Cough: Secondary | ICD-10-CM | POA: Diagnosis not present

## 2016-02-03 NOTE — Telephone Encounter (Signed)
Refill request fo Testosteroner, last seen [id number], last filled [id number].  Please advise.

## 2016-02-03 NOTE — Telephone Encounter (Signed)
Pt wife stated that pharmacy did not receive the Rx for pt's testosterone injection, he has an appointment in the office in the morning to receive the injection. Pt contact [phone]

## 2016-02-04 ENCOUNTER — Ambulatory Visit (INDEPENDENT_AMBULATORY_CARE_PROVIDER_SITE_OTHER): Payer: PPO

## 2016-02-04 DIAGNOSIS — E349 Endocrine disorder, unspecified: Secondary | ICD-10-CM

## 2016-02-04 MED ORDER — TESTOSTERONE CYPIONATE 200 MG/ML IM SOLN: INTRAMUSCULAR | 5 refills | Status: DC

## 2016-02-04 NOTE — Telephone Encounter (Signed)
Medication filled and given to patient.

## 2016-02-04 NOTE — Telephone Encounter (Signed)
Ok to refill 

## 2016-02-04 NOTE — Progress Notes (Signed)
Patient comes in for testosterone injection.  Injected right upper quadrant.  Patient tolerated injection

## 2016-02-18 ENCOUNTER — Telehealth: Payer: Self-pay | Admitting: Internal Medicine

## 2016-02-18 ENCOUNTER — Ambulatory Visit (INDEPENDENT_AMBULATORY_CARE_PROVIDER_SITE_OTHER): Payer: PPO

## 2016-02-18 DIAGNOSIS — E291 Testicular hypofunction: Secondary | ICD-10-CM | POA: Diagnosis not present

## 2016-02-18 DIAGNOSIS — R7989 Other specified abnormal findings of blood chemistry: Secondary | ICD-10-CM

## 2016-02-18 MED ORDER — TESTOSTERONE CYPIONATE 100 MG/ML IM SOLN
200.0000 mg | INTRAMUSCULAR | Status: DC
  Administered 2016-03-30: 200 mg via INTRAMUSCULAR

## 2016-02-18 NOTE — Telephone Encounter (Signed)
Pt dropped off Disability parking placard to be filled out.. Placed in Dr. Demetrios Isaacs folder up front. Please advise pt when ready

## 2016-02-18 NOTE — Progress Notes (Signed)
Patient comes in for testosterone injection.  Injected left upper quadrant.  Patient tolerated injection well.

## 2016-02-19 NOTE — Telephone Encounter (Signed)
Patients wife was notified and will pick up Friday 02/20/2016

## 2016-02-20 ENCOUNTER — Telehealth: Payer: Self-pay | Admitting: Internal Medicine

## 2016-02-20 MED ORDER — LACTULOSE 10 GM/15ML PO SOLN: ORAL | 3 refills | Status: DC

## 2016-02-20 NOTE — Telephone Encounter (Signed)
Refill sent.

## 2016-02-20 NOTE — Telephone Encounter (Signed)
Pt need a refill on lactulose (CHRONULAC) 10 GM/15ML solution sent to [facility] on [address].[name]

## 2016-02-26 NOTE — Progress Notes (Signed)
  I have reviewed the above information and agree with above.   Kaelan Amble, MD 

## 2016-02-28 ENCOUNTER — Other Ambulatory Visit: Payer: Self-pay | Admitting: Internal Medicine

## 2016-03-03 ENCOUNTER — Ambulatory Visit (INDEPENDENT_AMBULATORY_CARE_PROVIDER_SITE_OTHER): Payer: PPO

## 2016-03-03 DIAGNOSIS — E291 Testicular hypofunction: Secondary | ICD-10-CM | POA: Diagnosis not present

## 2016-03-03 DIAGNOSIS — R7989 Other specified abnormal findings of blood chemistry: Secondary | ICD-10-CM

## 2016-03-03 DIAGNOSIS — E349 Endocrine disorder, unspecified: Secondary | ICD-10-CM

## 2016-03-03 NOTE — Progress Notes (Signed)
  I have reviewed the above information and agree with above.   Akita Maxim, MD 

## 2016-03-03 NOTE — Progress Notes (Signed)
Patient comes in for testosterone injection.  Received in Right upper outer quadrant.  Patient tolerated well.

## 2016-03-05 ENCOUNTER — Telehealth: Payer: Self-pay | Admitting: Internal Medicine

## 2016-03-05 NOTE — Telephone Encounter (Signed)
Pt wife called back in regards to a msg you left last Friday for medication.  Call @ [phone]

## 2016-03-05 NOTE — Telephone Encounter (Signed)
Contacted [facility] they're going to contact [place] and get drug profile for patient.   Notified patient that I had called [facility] and they were going to contact [place] and they would get patients drug profile changed over.

## 2016-03-12 DIAGNOSIS — J019 Acute sinusitis, unspecified: Secondary | ICD-10-CM | POA: Diagnosis not present

## 2016-03-15 ENCOUNTER — Other Ambulatory Visit: Payer: Self-pay

## 2016-03-15 MED ORDER — AMLODIPINE BESYLATE 5 MG PO TABS
5.0000 mg | ORAL_TABLET | Freq: Every day | ORAL | 0 refills | Status: DC
Start: 2016-03-15 — End: 2016-05-31

## 2016-03-15 NOTE — Telephone Encounter (Signed)
lov 08/04/15 No office visit on file I verbally authorized one fill of the amlodipine .  Please advise.

## 2016-03-17 ENCOUNTER — Ambulatory Visit: Payer: PPO

## 2016-03-17 NOTE — Telephone Encounter (Signed)
No problem, needs to be seen every 6 months,  Please set up if needed

## 2016-03-18 ENCOUNTER — Ambulatory Visit: Payer: PPO

## 2016-03-19 ENCOUNTER — Other Ambulatory Visit: Payer: Self-pay | Admitting: *Deleted

## 2016-03-19 MED ORDER — HYDROXYUREA 500 MG PO CAPS: ORAL_CAPSULE | ORAL | 0 refills | Status: DC

## 2016-03-22 ENCOUNTER — Ambulatory Visit (INDEPENDENT_AMBULATORY_CARE_PROVIDER_SITE_OTHER): Payer: PPO

## 2016-03-22 DIAGNOSIS — E349 Endocrine disorder, unspecified: Secondary | ICD-10-CM | POA: Diagnosis not present

## 2016-03-22 DIAGNOSIS — R7989 Other specified abnormal findings of blood chemistry: Secondary | ICD-10-CM

## 2016-03-22 DIAGNOSIS — E291 Testicular hypofunction: Secondary | ICD-10-CM

## 2016-03-22 NOTE — Progress Notes (Signed)
Patient comes in for testosterone injections.   Injected left upper quadrant.  Patient tolerated injection well.

## 2016-03-25 ENCOUNTER — Telehealth: Payer: Self-pay | Admitting: *Deleted

## 2016-03-25 NOTE — Telephone Encounter (Signed)
[facility] has requested a medication hydralazine

## 2016-03-26 ENCOUNTER — Other Ambulatory Visit: Payer: Self-pay

## 2016-03-26 MED ORDER — HYDRALAZINE HCL 50 MG PO TABS: 50.0000 mg | ORAL_TABLET | Freq: Three times a day (TID) | ORAL | 1 refills | Status: DC

## 2016-03-26 NOTE — Telephone Encounter (Signed)
Refill sent.

## 2016-03-28 NOTE — Progress Notes (Signed)
  I have reviewed the above information and agree with above.   Nichollas Perusse, MD 

## 2016-03-30 ENCOUNTER — Ambulatory Visit (INDEPENDENT_AMBULATORY_CARE_PROVIDER_SITE_OTHER): Payer: PPO

## 2016-03-30 DIAGNOSIS — E291 Testicular hypofunction: Secondary | ICD-10-CM

## 2016-03-30 DIAGNOSIS — R7989 Other specified abnormal findings of blood chemistry: Secondary | ICD-10-CM

## 2016-03-30 MED ORDER — TESTOSTERONE CYPIONATE 100 MG/ML IM SOLN
100.0000 mg | INTRAMUSCULAR | Status: DC
  Administered 2016-03-30: 100 mg via INTRAMUSCULAR

## 2016-03-30 MED ORDER — TESTOSTERONE ENANTHATE 200 MG/ML IM SOLN: 200.0000 mg | INTRAMUSCULAR | Status: DC

## 2016-03-30 NOTE — Progress Notes (Signed)
Patient comes in for testosterone injection.  Injected  Right upper quadrant.  Patient tolerated well.

## 2016-04-01 NOTE — Progress Notes (Signed)
  I have reviewed the above information and agree with above.   Lisle Skillman, MD 

## 2016-04-07 NOTE — Progress Notes (Signed)
  I have reviewed the above information and agree with above.   Jarica Plass, MD 

## 2016-04-14 ENCOUNTER — Inpatient Hospital Stay: Payer: PPO | Attending: Internal Medicine

## 2016-04-14 ENCOUNTER — Inpatient Hospital Stay (HOSPITAL_BASED_OUTPATIENT_CLINIC_OR_DEPARTMENT_OTHER): Payer: PPO | Admitting: Internal Medicine

## 2016-04-14 VITALS — BP 146/64 | HR 61 | Temp 95.8°F | Wt 145.4 lb

## 2016-04-14 DIAGNOSIS — Z800 Family history of malignant neoplasm of digestive organs: Secondary | ICD-10-CM | POA: Diagnosis not present

## 2016-04-14 DIAGNOSIS — E785 Hyperlipidemia, unspecified: Secondary | ICD-10-CM | POA: Insufficient documentation

## 2016-04-14 DIAGNOSIS — I10 Essential (primary) hypertension: Secondary | ICD-10-CM

## 2016-04-14 DIAGNOSIS — Z7982 Long term (current) use of aspirin: Secondary | ICD-10-CM | POA: Insufficient documentation

## 2016-04-14 DIAGNOSIS — M810 Age-related osteoporosis without current pathological fracture: Secondary | ICD-10-CM | POA: Insufficient documentation

## 2016-04-14 DIAGNOSIS — D473 Essential (hemorrhagic) thrombocythemia: Secondary | ICD-10-CM | POA: Diagnosis not present

## 2016-04-14 DIAGNOSIS — Z79899 Other long term (current) drug therapy: Secondary | ICD-10-CM | POA: Diagnosis not present

## 2016-04-14 DIAGNOSIS — Z86010 Personal history of colonic polyps: Secondary | ICD-10-CM | POA: Insufficient documentation

## 2016-04-14 DIAGNOSIS — Z880 Allergy status to penicillin: Secondary | ICD-10-CM | POA: Insufficient documentation

## 2016-04-14 DIAGNOSIS — K5900 Constipation, unspecified: Secondary | ICD-10-CM | POA: Insufficient documentation

## 2016-04-14 DIAGNOSIS — K869 Disease of pancreas, unspecified: Secondary | ICD-10-CM | POA: Diagnosis present

## 2016-04-14 DIAGNOSIS — R630 Anorexia: Secondary | ICD-10-CM

## 2016-04-14 DIAGNOSIS — Z888 Allergy status to other drugs, medicaments and biological substances status: Secondary | ICD-10-CM

## 2016-04-14 DIAGNOSIS — R634 Abnormal weight loss: Secondary | ICD-10-CM | POA: Insufficient documentation

## 2016-04-14 DIAGNOSIS — Z8673 Personal history of transient ischemic attack (TIA), and cerebral infarction without residual deficits: Secondary | ICD-10-CM | POA: Diagnosis not present

## 2016-04-14 LAB — CBC WITH DIFFERENTIAL/PLATELET
BASOS PCT: 1 %
Basophils Absolute: 0 10*3/uL (ref 0–0.1)
Eosinophils Absolute: 0.1 10*3/uL (ref 0–0.7)
Eosinophils Relative: 1 %
HEMATOCRIT: 38.5 % — AB (ref 40.0–52.0)
HEMOGLOBIN: 13.7 g/dL (ref 13.0–18.0)
LYMPHS PCT: 19 %
Lymphs Abs: 1 10*3/uL (ref 1.0–3.6)
MCH: 43.5 pg — ABNORMAL HIGH (ref 26.0–34.0)
MCHC: 35.5 g/dL (ref 32.0–36.0)
MCV: 122.5 fL — AB (ref 80.0–100.0)
MONO ABS: 0.6 10*3/uL (ref 0.2–1.0)
MONOS PCT: 11 %
NEUTROS ABS: 3.5 10*3/uL (ref 1.4–6.5)
NEUTROS PCT: 68 %
Platelets: 199 10*3/uL (ref 150–440)
RBC: 3.14 MIL/uL — ABNORMAL LOW (ref 4.40–5.90)
RDW: 14.3 % (ref 11.5–14.5)
WBC: 5.2 10*3/uL (ref 3.8–10.6)

## 2016-04-14 LAB — COMPREHENSIVE METABOLIC PANEL
ALBUMIN: 4.2 g/dL (ref 3.5–5.0)
ALK PHOS: 50 U/L (ref 38–126)
ALT: 10 U/L — ABNORMAL LOW (ref 17–63)
ANION GAP: 5 (ref 5–15)
AST: 20 U/L (ref 15–41)
BILIRUBIN TOTAL: 1.4 mg/dL — AB (ref 0.3–1.2)
BUN: 16 mg/dL (ref 6–20)
CALCIUM: 9.3 mg/dL (ref 8.9–10.3)
CO2: 31 mmol/L (ref 22–32)
Chloride: 95 mmol/L — ABNORMAL LOW (ref 101–111)
Creatinine, Ser: 0.97 mg/dL (ref 0.61–1.24)
GFR calc Af Amer: >60 mL/min (ref >60)
GLUCOSE: 86 mg/dL (ref 65–99)
Potassium: 4 mmol/L (ref 3.5–5.1)
Sodium: 131 mmol/L — ABNORMAL LOW (ref 135–145)
Total Protein: 6.9 g/dL (ref 6.5–8.1)

## 2016-04-14 NOTE — Assessment & Plan Note (Addendum)
#   Essential thrombocytosis- on Hydrea-500 mg once a day. Today CBC within normal limits-platelet count 199/macrocytosis from Hydrea. Creatinine stable are LFTs normal. No other side effects noted. Continue Hydrea 500 mg once a day. ET- stable; However concerning for weight loss [C discussion below]. Question myelofibrosis versus other malignancy.  # weight loss > 10 pounds in last 6 months/ unintentional. Recommend CT C/A/P with contrast. Dietary referral.   # follow up few days after the CT scan.   # 25 minutes face-to-face with the patient discussing the above plan of care; more than 50% of time spent on prognosis/ natural history; counseling and coordination.

## 2016-04-14 NOTE — Progress Notes (Signed)
Patient here today for follow up.   

## 2016-04-14 NOTE — Progress Notes (Signed)
[facility] OFFICE PROGRESS NOTE  Patient Care Team: Teresa L Tullo, MD as PCP - General (Internal Medicine) Jeffrey W Byrnett, MD as Consulting Physician (General Surgery) Teresa L Tullo, MD as Referring Physician (Internal Medicine) Sandeep Pandit, MD (Inactive) as Medical Oncologist (Internal Medicine)   SUMMARY OF HEMATOLOGIC HISTORY:  # 2008- ESSENTIAL THROMBOCYTOSIS- Hydrea 500mg/d; [Dr.Pandit] NOV 2008- BMBx- slight increase in enlarged megakayocytes; bcr-abl-Neg;Jak-2 NEG; MPL/CALR-NEG  # previous TIA   INTERVAL HISTORY:  83-year-old male patient with above history of essential thrombocytosis on Hydrea is here for follow-up.   Patient complains of unintentional weight loss of about more than 10 pounds in the last 6 months. Complains of poor appetite. His fairly active for his age. No recent hospitalizations. Patient denies any recent blood clots or any strokes or TIAs. Denies any burning pain in his extremities.    REVIEW OF SYSTEMS:  A complete 10 point review of system is done which is negative except mentioned above/history of present illness.   PAST MEDICAL HISTORY :  Past Medical History:  Diagnosis Date  . Colon polyps   . Essential thrombocytosis ([facility])   . Essential thrombocytosis ([facility])   . Hyperlipidemia   . Hypertension   . Lung nodule 12/2004   found on CXR  . Osteoporosis     PAST SURGICAL HISTORY :   Past Surgical History:  Procedure Laterality Date  . BONE MARROW BIOPSY  01/18/07  . COLONOSCOPY  2007, 2010   Dr Wohl  . NASAL SINUS SURGERY  06/11/05    FAMILY HISTORY :   Family History  Problem Relation Age of Onset  . Colon cancer Brother     colon age 60's  . Colon cancer Mother     colon age 80's  . Peripheral vascular disease Father   . Diabetes      SOCIAL HISTORY:   Social History  Substance Use Topics  . Smoking status: Never Smoker  . Smokeless tobacco: Never Used  . Alcohol use No    ALLERGIES:  is allergic to  amlodipine; atenolol; erythromycin; furosemide; hctz [hydrochlorothiazide]; levofloxacin; metoprolol; micardis [telmisartan]; penicillins; terazosin; and triamterene-hctz.  MEDICATIONS:  Current Outpatient Prescriptions  Medication Sig Dispense Refill  . amLODipine (NORVASC) 5 MG tablet Take 1 tablet (5 mg total) by mouth daily. 30 tablet 0  . aspirin EC 81 MG tablet Take 81 mg by mouth daily. Reported on 08/14/2015    . Calcium Citrate-Vitamin D (CITRACAL + D PO) Take 3 tablets by mouth daily. Take 2 by mouth daily, calcium 630 and vitamin D 500 each.    . Cholecalciferol (VITAMIN D3) 1000 UNITS CAPS Take 2,000 Units by mouth daily. Take 2 by mouth daily    . fluticasone (FLONASE) 50 MCG/ACT nasal spray Place into the nose.    . hydrALAZINE (APRESOLINE) 50 MG tablet Take 1 tablet (50 mg total) by mouth 3 (three) times daily. 90 tablet 1  . hydroxyurea (HYDREA) 500 MG capsule TAKE ONE CAPSULE BY MOUTH ONCE DAILY WITH FOOD TO MINIMIZE GI SIDE EFFECTS 30 capsule 0  . lactulose (CHRONULAC) 10 GM/15ML solution TAKE 30ML'S BY MOUTH EVERY FOUR HOURS UNTIL CONSTIPATION IS RELIEVED 236 mL 3  . lisinopril (PRINIVIL,ZESTRIL) 40 MG tablet TAKE ONE TABLET BY MOUTH ONCE DAILY 30 tablet 2  . testosterone cypionate (DEPOTESTOSTERONE CYPIONATE) 200 MG/ML injection INJECT 0.5ML'S INTO THE MUSCLE EVERY 14 DAYS 10 mL 5  . triamcinolone ointment (KENALOG) 0.1 % Apply topically.    . mirtazapine (REMERON)   15 MG tablet Take 0.5 tablets (7.5 mg total) by mouth at bedtime. increase to 1 tablet after one week (Patient not taking: Reported on 04/14/2016) 30 tablet 2   Current Facility-Administered Medications  Medication Dose Route Frequency Provider Last Rate Last Dose  . testosterone cypionate (DEPOTESTOTERONE CYPIONATE) injection 100 mg  100 mg Intramuscular Q14 Days Teresa L Tullo, MD   100 mg at 11/26/15 0942  . testosterone cypionate (DEPOTESTOTERONE CYPIONATE) injection 100 mg  100 mg Intramuscular Q14 Days Teresa L  Tullo, MD   100 mg at 03/22/16 1034  . testosterone cypionate (DEPOTESTOTERONE CYPIONATE) injection 200 mg  200 mg Intramuscular Q14 Days Teresa L Tullo, MD   200 mg at 03/30/16 1035  . testosterone enanthate (DELATESTRYL) injection 200 mg  200 mg Intramuscular Q14 Days Charlene Scott, MD        PHYSICAL EXAMINATION:   BP (!) 146/64 (BP Location: Right Arm, Patient Position: Sitting)   Pulse 61   Temp (!) 95.8 F (35.4 C) (Tympanic)   Wt 145 lb 6 oz (65.9 kg)   BMI 23.46 kg/m   Filed Weights   04/14/16 1050  Weight: 145 lb 6 oz (65.9 kg)    GENERAL: Well-nourished well-developed; Alert, no distress and comfortable. Accompanied by his wife. EYES: no pallor or icterus OROPHARYNX: no thrush or ulceration; good dentition  NECK: supple, no masses felt LYMPH:  no palpable lymphadenopathy in the cervical, axillary or inguinal regions LUNGS: clear to auscultation and  No wheeze or crackles HEART/CVS: regular rate & rhythm and no murmurs; No lower extremity edema ABDOMEN:abdomen soft, non-tender and normal bowel sounds Musculoskeletal:no cyanosis of digits and no clubbing  PSYCH: alert & oriented x 3 with fluent speech NEURO: no focal motor/sensory deficits SKIN:  no rashes or significant lesions  LABORATORY DATA:  I have reviewed the data as listed    Component Value Date/Time   NA 131 (L) 04/14/2016 1005   NA 136 05/19/2011 1049   K 4.0 04/14/2016 1005   K 4.3 05/19/2011 1049   CL 95 (L) 04/14/2016 1005   CL 99 05/19/2011 1049   CO2 31 04/14/2016 1005   CO2 32 05/19/2011 1049   GLUCOSE 86 04/14/2016 1005   GLUCOSE 116 (H) 05/19/2011 1049   BUN 16 04/14/2016 1005   BUN 8 05/19/2011 1049   CREATININE 0.97 04/14/2016 1005   CREATININE 1.15 12/24/2013 0921   CALCIUM 9.3 04/14/2016 1005   CALCIUM 9.0 05/19/2011 1049   PROT 6.9 04/14/2016 1005   PROT 6.8 12/24/2013 0921   ALBUMIN 4.2 04/14/2016 1005   ALBUMIN 4.0 12/24/2013 0921   AST 20 04/14/2016 1005   AST 23  12/24/2013 0921   ALT 10 (L) 04/14/2016 1005   ALT 19 12/24/2013 0921   ALKPHOS 50 04/14/2016 1005   ALKPHOS 57 12/24/2013 0921   BILITOT 1.4 (H) 04/14/2016 1005   BILITOT 0.9 12/24/2013 0921   GFRNONAA >60 04/14/2016 1005   GFRNONAA >60 12/24/2013 0921   GFRNONAA 50 (L) 09/03/2013 0930   GFRAA >60 04/14/2016 1005   GFRAA >60 12/24/2013 0921   GFRAA 58 (L) 09/03/2013 0930    No results found for: SPEP, UPEP  Lab Results  Component Value Date   WBC 5.2 04/14/2016   NEUTROABS 3.5 04/14/2016   HGB 13.7 04/14/2016   HCT 38.5 (L) 04/14/2016   MCV 122.5 (H) 04/14/2016   PLT 199 04/14/2016      Chemistry      Component Value Date/Time     NA 131 (L) 04/14/2016 1005   NA 136 05/19/2011 1049   K 4.0 04/14/2016 1005   K 4.3 05/19/2011 1049   CL 95 (L) 04/14/2016 1005   CL 99 05/19/2011 1049   CO2 31 04/14/2016 1005   CO2 32 05/19/2011 1049   BUN 16 04/14/2016 1005   BUN 8 05/19/2011 1049   CREATININE 0.97 04/14/2016 1005   CREATININE 1.15 12/24/2013 0921      Component Value Date/Time   CALCIUM 9.3 04/14/2016 1005   CALCIUM 9.0 05/19/2011 1049   ALKPHOS 50 04/14/2016 1005   ALKPHOS 57 12/24/2013 0921   AST 20 04/14/2016 1005   AST 23 12/24/2013 0921   ALT 10 (L) 04/14/2016 1005   ALT 19 12/24/2013 0921   BILITOT 1.4 (H) 04/14/2016 1005   BILITOT 0.9 12/24/2013 6754       RADIOGRAPHIC STUDIES: I have personally reviewed the radiological images as listed and agreed with the findings in the report. No results found.   ASSESSMENT & PLAN:   Essential thrombocytosis ([place]) # Essential thrombocytosis- on Hydrea-500 mg once a day. Today CBC within normal limits-platelet count 199/macrocytosis from Hydrea. Creatinine stable are LFTs normal. No other side effects noted. Continue Hydrea 500 mg once a day. ET- stable; However concerning for weight loss [C discussion below]. Question myelofibrosis versus other malignancy.  # weight loss > 10 pounds in last 6 months/  unintentional. Recommend CT C/A/P with contrast. Dietary referral.   # follow up few days after the CT scan.   # 25 minutes face-to-face with the patient discussing the above plan of care; more than 50% of time spent on prognosis/ natural history; counseling and coordination.      Cammie Sickle, MD 04/14/2016 4:04 PM

## 2016-04-15 ENCOUNTER — Ambulatory Visit (INDEPENDENT_AMBULATORY_CARE_PROVIDER_SITE_OTHER): Payer: PPO

## 2016-04-15 DIAGNOSIS — E349 Endocrine disorder, unspecified: Secondary | ICD-10-CM | POA: Diagnosis not present

## 2016-04-15 DIAGNOSIS — R7989 Other specified abnormal findings of blood chemistry: Secondary | ICD-10-CM

## 2016-04-15 DIAGNOSIS — E291 Testicular hypofunction: Secondary | ICD-10-CM

## 2016-04-15 MED ORDER — TESTOSTERONE CYPIONATE 100 MG/ML IM SOLN
200.0000 mg | INTRAMUSCULAR | Status: DC
  Administered 2016-04-15: 200 mg via INTRAMUSCULAR

## 2016-04-15 NOTE — Progress Notes (Signed)
Patient comes in for testosterone injection.  Received in left upper outer  quadrant.  Patient tolerated injection well.

## 2016-04-20 ENCOUNTER — Ambulatory Visit
Admission: RE | Admit: 2016-04-20 | Discharge: 2016-04-20 | Disposition: A | Discharge status: Home / Self Care | Payer: PPO | Source: Ambulatory Visit | Attending: Internal Medicine | Admitting: Internal Medicine

## 2016-04-20 DIAGNOSIS — R634 Abnormal weight loss: Secondary | ICD-10-CM | POA: Insufficient documentation

## 2016-04-20 DIAGNOSIS — I700 Atherosclerosis of aorta: Secondary | ICD-10-CM | POA: Insufficient documentation

## 2016-04-20 DIAGNOSIS — K8689 Other specified diseases of pancreas: Secondary | ICD-10-CM | POA: Diagnosis not present

## 2016-04-20 DIAGNOSIS — J849 Interstitial pulmonary disease, unspecified: Secondary | ICD-10-CM | POA: Diagnosis not present

## 2016-04-20 DIAGNOSIS — I77810 Thoracic aortic ectasia: Secondary | ICD-10-CM | POA: Diagnosis not present

## 2016-04-20 DIAGNOSIS — D473 Essential (hemorrhagic) thrombocythemia: Secondary | ICD-10-CM | POA: Diagnosis not present

## 2016-04-20 DIAGNOSIS — R911 Solitary pulmonary nodule: Secondary | ICD-10-CM | POA: Insufficient documentation

## 2016-04-20 DIAGNOSIS — I898 Other specified noninfective disorders of lymphatic vessels and lymph nodes: Secondary | ICD-10-CM | POA: Diagnosis not present

## 2016-04-20 MED ORDER — IOPAMIDOL (ISOVUE-300) INJECTION 61%
100.0000 mL | Freq: Once | INTRAVENOUS | Status: AC | PRN
  Administered 2016-04-20: 100 mL via INTRAVENOUS

## 2016-04-21 ENCOUNTER — Other Ambulatory Visit: Payer: Self-pay | Admitting: Internal Medicine

## 2016-04-22 ENCOUNTER — Other Ambulatory Visit: Payer: Self-pay | Admitting: *Deleted

## 2016-04-22 DIAGNOSIS — K8689 Other specified diseases of pancreas: Secondary | ICD-10-CM

## 2016-04-23 ENCOUNTER — Inpatient Hospital Stay (HOSPITAL_BASED_OUTPATIENT_CLINIC_OR_DEPARTMENT_OTHER): Payer: PPO | Admitting: Internal Medicine

## 2016-04-23 DIAGNOSIS — M810 Age-related osteoporosis without current pathological fracture: Secondary | ICD-10-CM | POA: Diagnosis not present

## 2016-04-23 DIAGNOSIS — Z86010 Personal history of colonic polyps: Secondary | ICD-10-CM

## 2016-04-23 DIAGNOSIS — I10 Essential (primary) hypertension: Secondary | ICD-10-CM

## 2016-04-23 DIAGNOSIS — K5900 Constipation, unspecified: Secondary | ICD-10-CM

## 2016-04-23 DIAGNOSIS — Z79899 Other long term (current) drug therapy: Secondary | ICD-10-CM

## 2016-04-23 DIAGNOSIS — K869 Disease of pancreas, unspecified: Secondary | ICD-10-CM

## 2016-04-23 DIAGNOSIS — Z880 Allergy status to penicillin: Secondary | ICD-10-CM

## 2016-04-23 DIAGNOSIS — Z800 Family history of malignant neoplasm of digestive organs: Secondary | ICD-10-CM

## 2016-04-23 DIAGNOSIS — D473 Essential (hemorrhagic) thrombocythemia: Secondary | ICD-10-CM

## 2016-04-23 DIAGNOSIS — R634 Abnormal weight loss: Secondary | ICD-10-CM | POA: Diagnosis not present

## 2016-04-23 DIAGNOSIS — K8689 Other specified diseases of pancreas: Secondary | ICD-10-CM | POA: Insufficient documentation

## 2016-04-23 DIAGNOSIS — R630 Anorexia: Secondary | ICD-10-CM

## 2016-04-23 DIAGNOSIS — E785 Hyperlipidemia, unspecified: Secondary | ICD-10-CM

## 2016-04-23 DIAGNOSIS — Z888 Allergy status to other drugs, medicaments and biological substances status: Secondary | ICD-10-CM | POA: Diagnosis not present

## 2016-04-23 DIAGNOSIS — Z8673 Personal history of transient ischemic attack (TIA), and cerebral infarction without residual deficits: Secondary | ICD-10-CM

## 2016-04-23 DIAGNOSIS — Z7982 Long term (current) use of aspirin: Secondary | ICD-10-CM | POA: Diagnosis not present

## 2016-04-23 NOTE — Progress Notes (Signed)
[facility] OFFICE PROGRESS NOTE  Patient Care Team: Crecencio Mc, MD as PCP - General (Internal Medicine) Robert Bellow, MD as Consulting Physician (General Surgery) Crecencio Mc, MD as Referring Physician (Internal Medicine) Leia Alf, MD (Inactive) as Medical Oncologist (Internal Medicine)   SUMMARY OF HEMATOLOGIC HISTORY:  # 2008- ESSENTIAL THROMBOCYTOSIS- Hydrea 555m/d; [Dr.Pandit] NOV 2008- BMBx- slight increase in enlarged megakayocytes; bcr-abl-Neg;Jak-2 NEG; MPL/CALR-NEG  # previous TIA   INTERVAL HISTORY:  83 year old male patient with above history of essential thrombocytosis on Hydrea is here for follow-up/ to review the results of his CT scan that was ordered for significant weight loss.  Patient continues to complain of poor appetite. He continues to be on aspirin. Patient denies any recent blood clots or any strokes or TIAs. Denies any burning pain in his extremities.  Complains of intermittent constipation. Last colonoscopy was more than 10 years ago [2007]. He denies any blood in stools or black stools.  REVIEW OF SYSTEMS:  A complete 10 point review of system is done which is negative except mentioned above/history of present illness.   PAST MEDICAL HISTORY :  Past Medical History:  Diagnosis Date  . Colon polyps   . Essential thrombocytosis ([place]   . Essential thrombocytosis ([place]   . Hyperlipidemia   . Hypertension   . Lung nodule 12/2004   found on CXR  . Osteoporosis     PAST SURGICAL HISTORY :   Past Surgical History:  Procedure Laterality Date  . BONE MARROW BIOPSY  01/18/07  . COLONOSCOPY  2007, 2010   Dr WAllen Norris . NASAL SINUS SURGERY  06/11/05    FAMILY HISTORY :   Family History  Problem Relation Age of Onset  . Colon cancer Brother     colon age 81's . Colon cancer Mother     colon age 81's . Peripheral vascular disease Father   . Diabetes      SOCIAL HISTORY:   Social History  Substance Use Topics  .  Smoking status: Never Smoker  . Smokeless tobacco: Never Used  . Alcohol use No    ALLERGIES:  is allergic to amlodipine; atenolol; erythromycin; furosemide; hctz [hydrochlorothiazide]; levofloxacin; metoprolol; micardis [telmisartan]; penicillins; terazosin; and triamterene-hctz.  MEDICATIONS:  Current Outpatient Prescriptions  Medication Sig Dispense Refill  . amLODipine (NORVASC) 5 MG tablet Take 1 tablet (5 mg total) by mouth daily. 30 tablet 0  . aspirin EC 81 MG tablet Take 81 mg by mouth daily. Reported on 08/14/2015    . Calcium Citrate-Vitamin D (CITRACAL + D PO) Take 3 tablets by mouth daily. Take 2 by mouth daily, calcium 630 and vitamin D 500 each.    . Cholecalciferol (VITAMIN D3) 1000 UNITS CAPS Take 2,000 Units by mouth daily. Take 2 by mouth daily    . fluticasone (FLONASE) 50 MCG/ACT nasal spray Place into the nose.    . hydrALAZINE (APRESOLINE) 50 MG tablet Take 1 tablet (50 mg total) by mouth 3 (three) times daily. 90 tablet 1  . hydroxyurea (HYDREA) 500 MG capsule TAKE 1 CAPSULE BY MOUTH ONCE DAILY WITH FOOD TO MINIMIZE GI SIDE EFFECTS 30 capsule 0  . lactulose (CHRONULAC) 10 GM/15ML solution TAKE 30ML'S BY MOUTH EVERY FOUR HOURS UNTIL CONSTIPATION IS RELIEVED 236 mL 3  . lisinopril (PRINIVIL,ZESTRIL) 40 MG tablet TAKE ONE TABLET BY MOUTH ONCE DAILY 30 tablet 2  . nystatin-triamcinolone (MYCOLOG II) cream Apply topically.    .Marland Kitchentestosterone cypionate (DEPOTESTOSTERONE CYPIONATE) 200  MG/ML injection INJECT 0.5ML'S INTO THE MUSCLE EVERY 14 DAYS 10 mL 5  . triamcinolone ointment (KENALOG) 0.1 % Apply topically.     Current Facility-Administered Medications  Medication Dose Route Frequency Provider Last Rate Last Dose  . testosterone cypionate (DEPOTESTOTERONE CYPIONATE) injection 100 mg  100 mg Intramuscular Q14 Days Crecencio Mc, MD   100 mg at 04/15/16 1502  . testosterone cypionate (DEPOTESTOTERONE CYPIONATE) injection 100 mg  100 mg Intramuscular Q14 Days Crecencio Mc, MD   100 mg at 03/22/16 1034  . testosterone cypionate (DEPOTESTOTERONE CYPIONATE) injection 200 mg  200 mg Intramuscular Q14 Days Crecencio Mc, MD   200 mg at 03/30/16 1035  . testosterone cypionate (DEPOTESTOTERONE CYPIONATE) injection 200 mg  200 mg Intramuscular Q14 Days Crecencio Mc, MD   200 mg at 04/15/16 1000  . testosterone enanthate (DELATESTRYL) injection 200 mg  200 mg Intramuscular Q14 Days Einar Pheasant, MD        PHYSICAL EXAMINATION:   BP (!) 172/69 (BP Location: Left Arm, Patient Position: Sitting)   Pulse 69   Temp (!) 96.2 F (35.7 C) (Tympanic)   Wt 148 lb 6 oz (67.3 kg)   BMI 23.95 kg/m   Filed Weights   04/23/16 1121  Weight: 148 lb 6 oz (67.3 kg)    GENERAL: Well-nourished well-developed; Alert, no distress and comfortable. Accompanied by his wife. EYES: no pallor or icterus OROPHARYNX: no thrush or ulceration; good dentition  NECK: supple, no masses felt LYMPH:  no palpable lymphadenopathy in the cervical, axillary or inguinal regions LUNGS: clear to auscultation and  No wheeze or crackles HEART/CVS: regular rate & rhythm and no murmurs; No lower extremity edema ABDOMEN:abdomen soft, non-tender and normal bowel sounds Musculoskeletal:no cyanosis of digits and no clubbing  PSYCH: alert & oriented x 3 with fluent speech NEURO: no focal motor/sensory deficits SKIN:  no rashes or significant lesions  LABORATORY DATA:  I have reviewed the data as listed    Component Value Date/Time   NA 131 (L) 04/14/2016 1005   NA 136 05/19/2011 1049   K 4.0 04/14/2016 1005   K 4.3 05/19/2011 1049   CL 95 (L) 04/14/2016 1005   CL 99 05/19/2011 1049   CO2 31 04/14/2016 1005   CO2 32 05/19/2011 1049   GLUCOSE 86 04/14/2016 1005   GLUCOSE 116 (H) 05/19/2011 1049   BUN 16 04/14/2016 1005   BUN 8 05/19/2011 1049   CREATININE 0.97 04/14/2016 1005   CREATININE 1.15 12/24/2013 0921   CALCIUM 9.3 04/14/2016 1005   CALCIUM 9.0 05/19/2011 1049   PROT 6.9  04/14/2016 1005   PROT 6.8 12/24/2013 0921   ALBUMIN 4.2 04/14/2016 1005   ALBUMIN 4.0 12/24/2013 0921   AST 20 04/14/2016 1005   AST 23 12/24/2013 0921   ALT 10 (L) 04/14/2016 1005   ALT 19 12/24/2013 0921   ALKPHOS 50 04/14/2016 1005   ALKPHOS 57 12/24/2013 0921   BILITOT 1.4 (H) 04/14/2016 1005   BILITOT 0.9 12/24/2013 0921   GFRNONAA >60 04/14/2016 1005   GFRNONAA >60 12/24/2013 0921   GFRNONAA 50 (L) 09/03/2013 0930   GFRAA >60 04/14/2016 1005   GFRAA >60 12/24/2013 0921   GFRAA 58 (L) 09/03/2013 0930    No results found for: SPEP, UPEP  Lab Results  Component Value Date   WBC 5.2 04/14/2016   NEUTROABS 3.5 04/14/2016   HGB 13.7 04/14/2016   HCT 38.5 (L) 04/14/2016   MCV 122.5 (H) 04/14/2016  PLT 199 04/14/2016      Chemistry      Component Value Date/Time   NA 131 (L) 04/14/2016 1005   NA 136 05/19/2011 1049   K 4.0 04/14/2016 1005   K 4.3 05/19/2011 1049   CL 95 (L) 04/14/2016 1005   CL 99 05/19/2011 1049   CO2 31 04/14/2016 1005   CO2 32 05/19/2011 1049   BUN 16 04/14/2016 1005   BUN 8 05/19/2011 1049   CREATININE 0.97 04/14/2016 1005   CREATININE 1.15 12/24/2013 0921      Component Value Date/Time   CALCIUM 9.3 04/14/2016 1005   CALCIUM 9.0 05/19/2011 1049   ALKPHOS 50 04/14/2016 1005   ALKPHOS 57 12/24/2013 0921   AST 20 04/14/2016 1005   AST 23 12/24/2013 0921   ALT 10 (L) 04/14/2016 1005   ALT 19 12/24/2013 0921   BILITOT 1.4 (H) 04/14/2016 1005   BILITOT 0.9 12/24/2013 1115       RADIOGRAPHIC STUDIES: I have personally reviewed the radiological images as listed and agreed with the findings in the report. No results found.   ASSESSMENT & PLAN:   Mass of pancreas #Pancreatic head mass/ incidental- 1.6 cm mass. I do not think this is the cause of his significant weight loss. Reviewed the images myself; we'll again review the images at the tumor conference. Patient will likely need EUS versus MRI abdomen.  # weight loss > 10 pounds in  last 6 months/ unintentional. Dietary referral. Patient declined.  # Essential thrombocytosis- on Hydrea-500 mg once a day.  Continue Hydrea 500 mg once a day. ET- stable; no concerns for any transformation to acute leukemia/myelofibrosis at this time; especially the context of his weight loss.  # follow up in appx. One month.   # also discussed with [place] RN navigator.   # I reviewed the blood work- with the patient in detail; also reviewed the imaging independently [as summarized above]; and with the patient in detail.   CC: Dr.Tullo.     Cammie Sickle, MD 04/23/2016 1:16 PM

## 2016-04-23 NOTE — Assessment & Plan Note (Addendum)
#  Pancreatic head mass/ incidental- 1.6 cm mass. I do not think this is the cause of his significant weight loss. Reviewed the images myself; we'll again review the images at the tumor conference. Patient will likely need EUS versus MRI abdomen.  # weight loss > 10 pounds in last 6 months/ unintentional. Dietary referral. Patient declined.  # Essential thrombocytosis- on Hydrea-500 mg once a day.  Continue Hydrea 500 mg once a day. ET- stable; no concerns for any transformation to acute leukemia/myelofibrosis at this time; especially the context of his weight loss.  # follow up in appx. One month.   # also discussed with [place] RN navigator.   # I reviewed the blood work- with the patient in detail; also reviewed the imaging independently [as summarized above]; and with the patient in detail.   CC: Dr.Tullo.

## 2016-04-23 NOTE — Progress Notes (Signed)
Patient here today for follow up.   

## 2016-04-26 ENCOUNTER — Other Ambulatory Visit: Payer: Self-pay | Admitting: Internal Medicine

## 2016-04-26 NOTE — Progress Notes (Signed)
  Oncology Nurse Navigator Documentation Met with Jimmy Jimenez after visit with Dr. Rogue Bussing. Introduced Therapist, nutritional and provided him with my contact information for any future questions or needs. Navigator Location: [facility] (04/26/16 1600)   )Navigator Encounter Type: Follow-up Appt (04/26/16 1600)                                          Acuity: Level 2 (04/26/16 1600)   Acuity Level 2: Initial guidance, education and coordination as needed;Educational needs;Ongoing guidance and education throughout treatment as needed;Assistance expediting appointments (04/26/16 1600)     Time Spent with Patient: 15 (04/26/16 1600)

## 2016-04-29 ENCOUNTER — Ambulatory Visit (INDEPENDENT_AMBULATORY_CARE_PROVIDER_SITE_OTHER): Payer: PPO

## 2016-04-29 ENCOUNTER — Telehealth: Payer: Self-pay | Admitting: Internal Medicine

## 2016-04-29 ENCOUNTER — Other Ambulatory Visit: Payer: Self-pay | Admitting: Internal Medicine

## 2016-04-29 ENCOUNTER — Inpatient Hospital Stay: Payer: PPO

## 2016-04-29 DIAGNOSIS — E291 Testicular hypofunction: Secondary | ICD-10-CM

## 2016-04-29 DIAGNOSIS — R7989 Other specified abnormal findings of blood chemistry: Secondary | ICD-10-CM

## 2016-04-29 DIAGNOSIS — K8689 Other specified diseases of pancreas: Secondary | ICD-10-CM

## 2016-04-29 MED ORDER — TESTOSTERONE CYPIONATE 100 MG/ML IM SOLN
200.0000 mg | INTRAMUSCULAR | Status: DC
  Administered 2016-04-29: 200 mg via INTRAMUSCULAR

## 2016-04-29 NOTE — Telephone Encounter (Signed)
msg sent to sch. Team to arrange for mri

## 2016-04-29 NOTE — Telephone Encounter (Signed)
Please inform pt that his case conference- recommend MRI of abdomen; and have him follow up with me few days later after the scan. Thx

## 2016-04-29 NOTE — Progress Notes (Addendum)
Patient comes in for testosterone injection.  Injected right upper  Outer quadrant. Patient tolerated injection well.   Only administered 50 mg of Testerone.

## 2016-04-29 NOTE — Telephone Encounter (Signed)
Wife returned my phone call at 1530. Explained the need for an MRI of abdomen. She gave verbal understanding of the plan of care.

## 2016-04-29 NOTE — Telephone Encounter (Signed)
Attempted to reach patient. Unable to leave vm. Phone just rings.

## 2016-05-02 NOTE — Progress Notes (Signed)
  I have reviewed the above information and agree with above.   Tayja Manzer, MD 

## 2016-05-04 ENCOUNTER — Other Ambulatory Visit: Payer: Self-pay | Admitting: Internal Medicine

## 2016-05-12 ENCOUNTER — Ambulatory Visit
Admission: RE | Admit: 2016-05-12 | Discharge: 2016-05-12 | Disposition: A | Discharge status: Home / Self Care | Payer: PPO | Source: Ambulatory Visit | Attending: Internal Medicine | Admitting: Internal Medicine

## 2016-05-12 DIAGNOSIS — K869 Disease of pancreas, unspecified: Secondary | ICD-10-CM | POA: Insufficient documentation

## 2016-05-12 DIAGNOSIS — K8689 Other specified diseases of pancreas: Secondary | ICD-10-CM

## 2016-05-12 DIAGNOSIS — I700 Atherosclerosis of aorta: Secondary | ICD-10-CM | POA: Insufficient documentation

## 2016-05-12 MED ORDER — GADOBENATE DIMEGLUMINE 529 MG/ML IV SOLN
15.0000 mL | Freq: Once | INTRAVENOUS | Status: AC | PRN
  Administered 2016-05-12: 13 mL via INTRAVENOUS

## 2016-05-13 ENCOUNTER — Ambulatory Visit (INDEPENDENT_AMBULATORY_CARE_PROVIDER_SITE_OTHER): Payer: PPO | Admitting: *Deleted

## 2016-05-13 DIAGNOSIS — E291 Testicular hypofunction: Secondary | ICD-10-CM

## 2016-05-13 DIAGNOSIS — R7989 Other specified abnormal findings of blood chemistry: Secondary | ICD-10-CM

## 2016-05-13 MED ORDER — TESTOSTERONE CYPIONATE 200 MG/ML IM SOLN
100.0000 mg | Freq: Once | INTRAMUSCULAR | Status: AC
  Administered 2016-05-13: 100 mg via INTRAMUSCULAR

## 2016-05-13 NOTE — Progress Notes (Signed)
Patient presented for Testosterone injection to right upper outer quadrant, patient voiced or showed no signs of discomfort during injection.

## 2016-05-14 ENCOUNTER — Inpatient Hospital Stay: Payer: PPO | Attending: Internal Medicine | Admitting: Internal Medicine

## 2016-05-14 DIAGNOSIS — R630 Anorexia: Secondary | ICD-10-CM | POA: Insufficient documentation

## 2016-05-14 DIAGNOSIS — Z800 Family history of malignant neoplasm of digestive organs: Secondary | ICD-10-CM | POA: Insufficient documentation

## 2016-05-14 DIAGNOSIS — Z888 Allergy status to other drugs, medicaments and biological substances status: Secondary | ICD-10-CM | POA: Diagnosis not present

## 2016-05-14 DIAGNOSIS — M810 Age-related osteoporosis without current pathological fracture: Secondary | ICD-10-CM | POA: Diagnosis not present

## 2016-05-14 DIAGNOSIS — Z86010 Personal history of colonic polyps: Secondary | ICD-10-CM | POA: Diagnosis not present

## 2016-05-14 DIAGNOSIS — R5383 Other fatigue: Secondary | ICD-10-CM | POA: Diagnosis not present

## 2016-05-14 DIAGNOSIS — E785 Hyperlipidemia, unspecified: Secondary | ICD-10-CM | POA: Insufficient documentation

## 2016-05-14 DIAGNOSIS — I10 Essential (primary) hypertension: Secondary | ICD-10-CM | POA: Insufficient documentation

## 2016-05-14 DIAGNOSIS — R634 Abnormal weight loss: Secondary | ICD-10-CM | POA: Diagnosis not present

## 2016-05-14 DIAGNOSIS — D473 Essential (hemorrhagic) thrombocythemia: Secondary | ICD-10-CM | POA: Insufficient documentation

## 2016-05-14 DIAGNOSIS — Z8673 Personal history of transient ischemic attack (TIA), and cerebral infarction without residual deficits: Secondary | ICD-10-CM

## 2016-05-14 DIAGNOSIS — Z7982 Long term (current) use of aspirin: Secondary | ICD-10-CM

## 2016-05-14 DIAGNOSIS — Z79899 Other long term (current) drug therapy: Secondary | ICD-10-CM | POA: Insufficient documentation

## 2016-05-14 DIAGNOSIS — Z880 Allergy status to penicillin: Secondary | ICD-10-CM | POA: Diagnosis not present

## 2016-05-14 DIAGNOSIS — K869 Disease of pancreas, unspecified: Secondary | ICD-10-CM | POA: Insufficient documentation

## 2016-05-14 NOTE — Progress Notes (Signed)
[place] OFFICE PROGRESS NOTE  Patient Care Team: Crecencio Mc, MD as PCP - General (Internal Medicine) Robert Bellow, MD as Consulting Physician (General Surgery) Crecencio Mc, MD as Referring Physician (Internal Medicine) Leia Alf, MD (Inactive) as Medical Oncologist (Internal Medicine)   SUMMARY OF HEMATOLOGIC HISTORY:  # 2008- ESSENTIAL THROMBOCYTOSIS- Hydrea 531m/d; [Dr.Pandit] NOV 2008- BMBx- slight increase in enlarged megakayocytes; bcr-abl-Neg;Jak-2 NEG; MPL/CALR-NEG  # previous TIA   INTERVAL HISTORY:  83year old male patient with above history of essential thrombocytosis on Hydrea is here for follow-up/ to review the results of his MRI of the abdomen.  Patient continues to complain of poor appetite; continues to complain of weight loss.  Patient denies any recent blood clots or any strokes or TIAs. Denies any burning pain in his extremities.  Admits to fatigue. Otherwise no nausea vomiting.  REVIEW OF SYSTEMS:  A complete 10 point review of system is done which is negative except mentioned above/history of present illness.   PAST MEDICAL HISTORY :  Past Medical History:  Diagnosis Date  . Colon polyps   . Essential thrombocytosis ([facility]   . Essential thrombocytosis ([facility]   . Hyperlipidemia   . Hypertension   . Lung nodule 12/2004   found on CXR  . Osteoporosis     PAST SURGICAL HISTORY :   Past Surgical History:  Procedure Laterality Date  . BONE MARROW BIOPSY  01/18/07  . COLONOSCOPY  2007, 2010   Dr WAllen Norris . NASAL SINUS SURGERY  06/11/05    FAMILY HISTORY :   Family History  Problem Relation Age of Onset  . Colon cancer Brother     colon age 81's . Colon cancer Mother     colon age 81's . Peripheral vascular disease Father   . Diabetes      SOCIAL HISTORY:   Social History  Substance Use Topics  . Smoking status: Never Smoker  . Smokeless tobacco: Never Used  . Alcohol use No    ALLERGIES:  is allergic to  amlodipine; atenolol; erythromycin; furosemide; hctz [hydrochlorothiazide]; levofloxacin; metoprolol; micardis [telmisartan]; penicillins; terazosin; and triamterene-hctz.  MEDICATIONS:  Current Outpatient Prescriptions  Medication Sig Dispense Refill  . amLODipine (NORVASC) 5 MG tablet Take 1 tablet (5 mg total) by mouth daily. 30 tablet 0  . aspirin EC 81 MG tablet Take 81 mg by mouth daily. Reported on 08/14/2015    . Calcium Citrate-Vitamin D (CITRACAL + D PO) Take 3 tablets by mouth daily. Take 2 by mouth daily, calcium 630 and vitamin D 500 each.    . Cholecalciferol (VITAMIN D3) 1000 UNITS CAPS Take 2,000 Units by mouth daily. Take 2 by mouth daily    . fluticasone (FLONASE) 50 MCG/ACT nasal spray Place into the nose.    . hydrALAZINE (APRESOLINE) 50 MG tablet TAKE 1 TABLET BY MOUTH 3 TIMES DAILY 90 tablet 3  . hydroxyurea (HYDREA) 500 MG capsule TAKE 1 CAPSULE BY MOUTH ONCE DAILY WITH FOOD TO MINIMIZE GI SIDE EFFECTS 30 capsule 0  . lactulose (CHRONULAC) 10 GM/15ML solution USE 2 TABLESPOON BY MOUTH EVERY 4 HOURS AS NEEDED CONTIPATION 946 mL 0  . lisinopril (PRINIVIL,ZESTRIL) 40 MG tablet TAKE ONE TABLET BY MOUTH ONCE DAILY 30 tablet 2   No current facility-administered medications for this visit.     PHYSICAL EXAMINATION:   BP (!) 165/65 (BP Location: Left Arm, Patient Position: Sitting)   Pulse 70   Temp 97 F (36.1 C) (Tympanic)  Resp 18   Wt 145 lb 4 oz (65.9 kg)   BMI 23.44 kg/m   Filed Weights   05/14/16 1137  Weight: 145 lb 4 oz (65.9 kg)    GENERAL: Well-nourished well-developed; Alert, no distress and comfortable. Accompanied by his wife. EYES: no pallor or icterus OROPHARYNX: no thrush or ulceration; good dentition  NECK: supple, no masses felt LYMPH:  no palpable lymphadenopathy in the cervical, axillary or inguinal regions LUNGS: clear to auscultation and  No wheeze or crackles HEART/CVS: regular rate & rhythm and no murmurs; No lower extremity  edema ABDOMEN:abdomen soft, non-tender and normal bowel sounds Musculoskeletal:no cyanosis of digits and no clubbing  PSYCH: alert & oriented x 3 with fluent speech NEURO: no focal motor/sensory deficits SKIN:  no rashes or significant lesions  LABORATORY DATA:  I have reviewed the data as listed    Component Value Date/Time   NA 131 (L) 04/14/2016 1005   NA 136 05/19/2011 1049   K 4.0 04/14/2016 1005   K 4.3 05/19/2011 1049   CL 95 (L) 04/14/2016 1005   CL 99 05/19/2011 1049   CO2 31 04/14/2016 1005   CO2 32 05/19/2011 1049   GLUCOSE 86 04/14/2016 1005   GLUCOSE 116 (H) 05/19/2011 1049   BUN 16 04/14/2016 1005   BUN 8 05/19/2011 1049   CREATININE 0.97 04/14/2016 1005   CREATININE 1.15 12/24/2013 0921   CALCIUM 9.3 04/14/2016 1005   CALCIUM 9.0 05/19/2011 1049   PROT 6.9 04/14/2016 1005   PROT 6.8 12/24/2013 0921   ALBUMIN 4.2 04/14/2016 1005   ALBUMIN 4.0 12/24/2013 0921   AST 20 04/14/2016 1005   AST 23 12/24/2013 0921   ALT 10 (L) 04/14/2016 1005   ALT 19 12/24/2013 0921   ALKPHOS 50 04/14/2016 1005   ALKPHOS 57 12/24/2013 0921   BILITOT 1.4 (H) 04/14/2016 1005   BILITOT 0.9 12/24/2013 0921   GFRNONAA >60 04/14/2016 1005   GFRNONAA >60 12/24/2013 0921   GFRNONAA 50 (L) 09/03/2013 0930   GFRAA >60 04/14/2016 1005   GFRAA >60 12/24/2013 0921   GFRAA 58 (L) 09/03/2013 0930    No results found for: SPEP, UPEP  Lab Results  Component Value Date   WBC 5.2 04/14/2016   NEUTROABS 3.5 04/14/2016   HGB 13.7 04/14/2016   HCT 38.5 (L) 04/14/2016   MCV 122.5 (H) 04/14/2016   PLT 199 04/14/2016      Chemistry      Component Value Date/Time   NA 131 (L) 04/14/2016 1005   NA 136 05/19/2011 1049   K 4.0 04/14/2016 1005   K 4.3 05/19/2011 1049   CL 95 (L) 04/14/2016 1005   CL 99 05/19/2011 1049   CO2 31 04/14/2016 1005   CO2 32 05/19/2011 1049   BUN 16 04/14/2016 1005   BUN 8 05/19/2011 1049   CREATININE 0.97 04/14/2016 1005   CREATININE 1.15 12/24/2013 0921       Component Value Date/Time   CALCIUM 9.3 04/14/2016 1005   CALCIUM 9.0 05/19/2011 1049   ALKPHOS 50 04/14/2016 1005   ALKPHOS 57 12/24/2013 0921   AST 20 04/14/2016 1005   AST 23 12/24/2013 0921   ALT 10 (L) 04/14/2016 1005   ALT 19 12/24/2013 0921   BILITOT 1.4 (H) 04/14/2016 1005   BILITOT 0.9 12/24/2013 2671       RADIOGRAPHIC STUDIES: I have personally reviewed the radiological images as listed and agreed with the findings in the report. No results found.  ASSESSMENT & PLAN:   Essential thrombocytosis (H[facility]) #Pancreatic head mass/ incidental- 1.6 cm mass. MRI- not suspicious of malignant potential at this time. As per the recommendations will get MRI in one year. Also discussed at the tumor conference.   # Weight loss- unclear etiology still. Question related to Hydrea versus others. Recommend holding Hydrea at this time. We will get labs in 2 weeks; next visit.  # History of essential thrombocytosis- on Hydrea; no concerns for transformation/ or myelofibrosis at this time [no splenomegaly]  # weight loss > 10 pounds in last 6 months/ unintentional. Dietary referral. Patient declined.  # follow up in appx. One month; labs- 2 week; 4 week /lab/MD.    # I reviewed the blood work- with the patient in detail; also reviewed the imaging independently [as summarized above]; and with the patient in detail.   CC: Dr.Tullo.     Cammie Sickle, MD 05/14/2016 1:24 PM

## 2016-05-14 NOTE — Assessment & Plan Note (Addendum)
#  Pancreatic head mass/ incidental- 1.6 cm mass. MRI- not suspicious of malignant potential at this time. As per the recommendations will get MRI in one year. Also discussed at the tumor conference.   # Weight loss- unclear etiology still. Question related to Hydrea versus others. Recommend holding Hydrea at this time. We will get labs in 2 weeks; next visit.  # History of essential thrombocytosis- on Hydrea; no concerns for transformation/ or myelofibrosis at this time [no splenomegaly]  # weight loss > 10 pounds in last 6 months/ unintentional. Dietary referral. Patient declined.  # follow up in appx. One month; labs- 2 week; 4 week /lab/MD.    # I reviewed the blood work- with the patient in detail; also reviewed the imaging independently [as summarized above]; and with the patient in detail.   CC: Dr.Tullo.

## 2016-05-14 NOTE — Progress Notes (Signed)
Patient here today for follow up.  Patient states no new concerns today  

## 2016-05-16 NOTE — Progress Notes (Signed)
  I have reviewed the above information and agree with above.   Rydge Texidor, MD 

## 2016-05-21 ENCOUNTER — Ambulatory Visit: Payer: PPO | Admitting: Internal Medicine

## 2016-05-27 ENCOUNTER — Ambulatory Visit (INDEPENDENT_AMBULATORY_CARE_PROVIDER_SITE_OTHER): Payer: PPO | Admitting: *Deleted

## 2016-05-27 DIAGNOSIS — R7989 Other specified abnormal findings of blood chemistry: Secondary | ICD-10-CM

## 2016-05-27 DIAGNOSIS — E291 Testicular hypofunction: Secondary | ICD-10-CM

## 2016-05-27 MED ORDER — TESTOSTERONE CYPIONATE 200 MG/ML IM SOLN
100.0000 mg | Freq: Once | INTRAMUSCULAR | Status: AC
  Administered 2016-05-27: 100 mg via INTRAMUSCULAR

## 2016-05-27 NOTE — Progress Notes (Signed)
Patient presented fr testosterone injection to left upper outter quadrant, patient voiced no discomfort during injection and showed no signs of distress.

## 2016-05-28 ENCOUNTER — Inpatient Hospital Stay: Payer: PPO

## 2016-05-28 DIAGNOSIS — K869 Disease of pancreas, unspecified: Secondary | ICD-10-CM | POA: Diagnosis not present

## 2016-05-28 DIAGNOSIS — D473 Essential (hemorrhagic) thrombocythemia: Secondary | ICD-10-CM

## 2016-05-28 DIAGNOSIS — J019 Acute sinusitis, unspecified: Secondary | ICD-10-CM | POA: Diagnosis not present

## 2016-05-28 LAB — CBC WITH DIFFERENTIAL/PLATELET
Basophils Absolute: 0.1 10*3/uL (ref 0–0.1)
Basophils Relative: 1 %
EOS PCT: 2 %
Eosinophils Absolute: 0.1 10*3/uL (ref 0–0.7)
HCT: 40.1 % (ref 40.0–52.0)
Hemoglobin: 14 g/dL (ref 13.0–18.0)
LYMPHS PCT: 19 %
Lymphs Abs: 1.2 10*3/uL (ref 1.0–3.6)
MCH: 41.7 pg — AB (ref 26.0–34.0)
MCHC: 34.8 g/dL (ref 32.0–36.0)
MCV: 119.7 fL — ABNORMAL HIGH (ref 80.0–100.0)
MONO ABS: 0.8 10*3/uL (ref 0.2–1.0)
MONOS PCT: 13 %
NEUTROS ABS: 4 10*3/uL (ref 1.4–6.5)
Neutrophils Relative %: 65 %
PLATELETS: 276 10*3/uL (ref 150–440)
RBC: 3.35 MIL/uL — ABNORMAL LOW (ref 4.40–5.90)
RDW: 13 % (ref 11.5–14.5)
WBC: 6.1 10*3/uL (ref 3.8–10.6)

## 2016-05-31 ENCOUNTER — Telehealth: Payer: Self-pay

## 2016-05-31 ENCOUNTER — Other Ambulatory Visit: Payer: Self-pay | Admitting: Internal Medicine

## 2016-05-31 DIAGNOSIS — R7989 Other specified abnormal findings of blood chemistry: Secondary | ICD-10-CM

## 2016-05-31 DIAGNOSIS — E538 Deficiency of other specified B group vitamins: Secondary | ICD-10-CM

## 2016-05-31 NOTE — Telephone Encounter (Signed)
Was completing a refill for this patient, and noticed that he has a testosterone injection due on the 12th of April and he hasn't had a level drawn since march of 2017.  Do you want one drawn prior to the next injection?

## 2016-06-01 ENCOUNTER — Other Ambulatory Visit: Payer: Self-pay | Admitting: Internal Medicine

## 2016-06-01 NOTE — Telephone Encounter (Signed)
Left a voicemail to return my call.

## 2016-06-01 NOTE — Addendum Note (Signed)
Addended by: Crecencio Mc on: 06/01/2016 11:00 AM   Modules accepted: Orders

## 2016-06-01 NOTE — Telephone Encounter (Signed)
Yes,he needs one mid way between his scheduled shots, and has he been getting his b12 injections?  They were ordered last year after his B12 was low.  I have ordered both levels ,  Has to be a morning fasting 8 am  Or medicare will not pay

## 2016-06-01 NOTE — Telephone Encounter (Signed)
REFILLED,  PEASE SCHEDULE 6 MONTH FOLLOW UP

## 2016-06-01 NOTE — Telephone Encounter (Signed)
Refilled: 04/27/2016 Last OV: 08/14/2015 Next OV: not scheduled

## 2016-06-02 NOTE — Telephone Encounter (Signed)
appt has been scheduled for 07/23/2016 @ 10:00am.

## 2016-06-09 NOTE — Telephone Encounter (Signed)
Please mention this to the patient at his appt tomorrow, thanks

## 2016-06-10 ENCOUNTER — Other Ambulatory Visit: Payer: PPO

## 2016-06-10 ENCOUNTER — Ambulatory Visit (INDEPENDENT_AMBULATORY_CARE_PROVIDER_SITE_OTHER): Payer: PPO | Admitting: *Deleted

## 2016-06-10 DIAGNOSIS — R7989 Other specified abnormal findings of blood chemistry: Secondary | ICD-10-CM

## 2016-06-10 DIAGNOSIS — E291 Testicular hypofunction: Secondary | ICD-10-CM

## 2016-06-10 MED ORDER — TESTOSTERONE CYPIONATE 200 MG/ML IM SOLN
100.0000 mg | Freq: Once | INTRAMUSCULAR | Status: AC
  Administered 2016-06-10: 100 mg via INTRAMUSCULAR

## 2016-06-10 NOTE — Progress Notes (Signed)
Patient presented for testosterone injection to Upper Outer quadrant . Patient voiced no concerns and showed no signs of distress during injection. Patient labs were scheduled appropriately.

## 2016-06-10 NOTE — Addendum Note (Signed)
Addended by: Leeanne Rio on: 06/10/2016 10:27 AM   Modules accepted: Orders

## 2016-06-11 NOTE — Telephone Encounter (Signed)
Appointment schedule  

## 2016-06-12 NOTE — Progress Notes (Signed)
  I have reviewed the above information and agree with above.   Kadynce Bonds, MD 

## 2016-06-14 ENCOUNTER — Inpatient Hospital Stay (HOSPITAL_BASED_OUTPATIENT_CLINIC_OR_DEPARTMENT_OTHER): Payer: PPO | Admitting: Internal Medicine

## 2016-06-14 ENCOUNTER — Inpatient Hospital Stay: Payer: PPO | Attending: Internal Medicine

## 2016-06-14 ENCOUNTER — Telehealth: Payer: Self-pay | Admitting: Internal Medicine

## 2016-06-14 VITALS — BP 187/75 | HR 87 | Temp 97.8°F | Resp 20 | Ht 66.0 in | Wt 147.0 lb

## 2016-06-14 DIAGNOSIS — Z79899 Other long term (current) drug therapy: Secondary | ICD-10-CM | POA: Insufficient documentation

## 2016-06-14 DIAGNOSIS — Z800 Family history of malignant neoplasm of digestive organs: Secondary | ICD-10-CM | POA: Diagnosis not present

## 2016-06-14 DIAGNOSIS — E785 Hyperlipidemia, unspecified: Secondary | ICD-10-CM

## 2016-06-14 DIAGNOSIS — M810 Age-related osteoporosis without current pathological fracture: Secondary | ICD-10-CM | POA: Diagnosis not present

## 2016-06-14 DIAGNOSIS — I10 Essential (primary) hypertension: Secondary | ICD-10-CM | POA: Insufficient documentation

## 2016-06-14 DIAGNOSIS — D473 Essential (hemorrhagic) thrombocythemia: Secondary | ICD-10-CM

## 2016-06-14 DIAGNOSIS — Z8673 Personal history of transient ischemic attack (TIA), and cerebral infarction without residual deficits: Secondary | ICD-10-CM | POA: Diagnosis not present

## 2016-06-14 DIAGNOSIS — Z86010 Personal history of colonic polyps: Secondary | ICD-10-CM

## 2016-06-14 DIAGNOSIS — Z7982 Long term (current) use of aspirin: Secondary | ICD-10-CM | POA: Diagnosis not present

## 2016-06-14 DIAGNOSIS — Z888 Allergy status to other drugs, medicaments and biological substances status: Secondary | ICD-10-CM

## 2016-06-14 DIAGNOSIS — Z880 Allergy status to penicillin: Secondary | ICD-10-CM | POA: Diagnosis not present

## 2016-06-14 LAB — CBC WITH DIFFERENTIAL/PLATELET
BASOS PCT: 1 %
Basophils Absolute: 0.1 10*3/uL (ref 0–0.1)
EOS ABS: 0.1 10*3/uL (ref 0–0.7)
EOS PCT: 2 %
HCT: 40 % (ref 40.0–52.0)
HEMOGLOBIN: 14 g/dL (ref 13.0–18.0)
LYMPHS ABS: 1.3 10*3/uL (ref 1.0–3.6)
Lymphocytes Relative: 20 %
MCH: 39.3 pg — AB (ref 26.0–34.0)
MCHC: 35.1 g/dL (ref 32.0–36.0)
MCV: 112.1 fL — ABNORMAL HIGH (ref 80.0–100.0)
MONOS PCT: 9 %
Monocytes Absolute: 0.6 10*3/uL (ref 0.2–1.0)
NEUTROS PCT: 68 %
Neutro Abs: 4.5 10*3/uL (ref 1.4–6.5)
PLATELETS: 274 10*3/uL (ref 150–440)
RBC: 3.57 MIL/uL — ABNORMAL LOW (ref 4.40–5.90)
RDW: 14.7 % — AB (ref 11.5–14.5)
WBC: 6.6 10*3/uL (ref 3.8–10.6)

## 2016-06-14 NOTE — Telephone Encounter (Signed)
Left pt message asking to call Allison back directly at [phone] to schedule AWV. Thanks! °

## 2016-06-14 NOTE — Assessment & Plan Note (Addendum)
#   History of essential thrombocytosis- off Hydrea is March 2018 no concerns for transformation/ or myelofibrosis at this time [no splenomegaly]. Continue to hold Hydrea at this time. CBC today is normal.  # Weight loss- unclear etiology still. Question related to Hydrea versus others. Continue holding Hydrea.   # Elevated Blood pressure- 761- systolic. Recommend monitoring closely. If continues to be elevated 140s 150s at home recommend follow-up with PCP.  # labs in 6 week/ follow up MD/labs in 3 months.   CC: Dr.Tullo.

## 2016-06-14 NOTE — Progress Notes (Signed)
[place] OFFICE PROGRESS NOTE  Patient Care Team: Crecencio Mc, MD as PCP - General (Internal Medicine) Robert Bellow, MD as Consulting Physician (General Surgery) Crecencio Mc, MD as Referring Physician (Internal Medicine) Leia Alf, MD (Inactive) as Medical Oncologist (Internal Medicine)   SUMMARY OF HEMATOLOGIC HISTORY:  # 2008- ESSENTIAL THROMBOCYTOSIS- Hydrea 583m/d; [Dr.Pandit] NOV 2008- BMBx- slight increase in enlarged megakayocytes; bcr-abl-Neg;Jak-2 NEG; MPL/CALR-NEG; March 2018- OFF Hydrea [? Sec to weight loss/fatigue]  # previous TIA   INTERVAL HISTORY:  83year old male patient with above history of essential thrombocytosis on Hydrea is here for follow-up.   In the interim was started on testosterone for "osteoporosis" by PCP as per the wife. His appetite is fair. Has not lost more weight. Patient denies any recent blood clots or any strokes or TIAs. Denies any burning pain in his extremities. Otherwise no nausea vomiting.  REVIEW OF SYSTEMS:  A complete 10 point review of system is done which is negative except mentioned above/history of present illness.   PAST MEDICAL HISTORY :  Past Medical History:  Diagnosis Date  . Colon polyps   . Essential thrombocytosis ([facility]   . Essential thrombocytosis ([facility]   . Hyperlipidemia   . Hypertension   . Lung nodule 12/2004   found on CXR  . Osteoporosis     PAST SURGICAL HISTORY :   Past Surgical History:  Procedure Laterality Date  . BONE MARROW BIOPSY  01/18/07  . COLONOSCOPY  2007, 2010   Dr WAllen Norris . NASAL SINUS SURGERY  06/11/05    FAMILY HISTORY :   Family History  Problem Relation Age of Onset  . Colon cancer Brother     colon age 81's . Colon cancer Mother     colon age 81's . Peripheral vascular disease Father   . Diabetes      SOCIAL HISTORY:   Social History  Substance Use Topics  . Smoking status: Never Smoker  . Smokeless tobacco: Never Used  . Alcohol use No     ALLERGIES:  is allergic to amlodipine; atenolol; erythromycin; furosemide; hctz [hydrochlorothiazide]; levofloxacin; metoprolol; micardis [telmisartan]; penicillins; terazosin; and triamterene-hctz.  MEDICATIONS:  Current Outpatient Prescriptions  Medication Sig Dispense Refill  . amLODipine (NORVASC) 5 MG tablet TAKE 1 TABLET BY MOUTH ONCE DAILY 30 tablet 3  . aspirin EC 81 MG tablet Take 81 mg by mouth daily. Reported on 08/14/2015    . Calcium Citrate-Vitamin D (CITRACAL + D PO) Take 3 tablets by mouth daily. Take 2 by mouth daily, calcium 630 and vitamin D 500 each.    . Cholecalciferol (VITAMIN D3) 1000 UNITS CAPS Take 2,000 Units by mouth daily. Take 2 by mouth daily    . fluticasone (FLONASE) 50 MCG/ACT nasal spray Place 1 spray into both nostrils daily.     .Marland KitchenguaiFENesin (MUCINEX) 600 MG 12 hr tablet Take 600 mg by mouth at bedtime as needed for to loosen phlegm.    . hydrALAZINE (APRESOLINE) 50 MG tablet TAKE 1 TABLET BY MOUTH 3 TIMES DAILY 90 tablet 3  . lactulose (CHRONULAC) 10 GM/15ML solution TAKE 2 TABLESPOONFULS (30MLS) BY MOUTH EVERY 4 HOURS AS NEEDED FOR CONSTIPATION 946 mL 0  . lisinopril (PRINIVIL,ZESTRIL) 40 MG tablet TAKE 1 TABLET BY MOUTH ONCE DAILY 30 tablet 3  . hydroxyurea (HYDREA) 500 MG capsule TAKE 1 CAPSULE BY MOUTH ONCE DAILY WITH FOOD TO MINIMIZE GI SIDE EFFECTS (Patient not taking: Reported on 06/14/2016) 30 capsule 0  .  testosterone cypionate (DEPOTESTOSTERONE CYPIONATE) 200 MG/ML injection Inject 1 mL into the muscle every 14 (fourteen) days.     No current facility-administered medications for this visit.     PHYSICAL EXAMINATION:   BP (!) 187/75 (BP Location: Left Arm, Patient Position: Sitting)   Pulse 87   Temp 97.8 F (36.6 C) (Tympanic)   Resp 20   Ht _0  (1.676 m)   Wt 147 lb (66.7 kg)   BMI 23.73 kg/m   Filed Weights   06/14/16 1137  Weight: 147 lb (66.7 kg)    GENERAL: Well-nourished well-developed; Alert, no distress and  comfortable. Accompanied by his wife. EYES: no pallor or icterus OROPHARYNX: no thrush or ulceration; good dentition  NECK: supple, no masses felt LYMPH:  no palpable lymphadenopathy in the cervical, axillary or inguinal regions LUNGS: clear to auscultation and  No wheeze or crackles HEART/CVS: regular rate & rhythm and no murmurs; No lower extremity edema ABDOMEN:abdomen soft, non-tender and normal bowel sounds Musculoskeletal:no cyanosis of digits and no clubbing  PSYCH: alert & oriented x 3 with fluent speech NEURO: no focal motor/sensory deficits SKIN:  no rashes or significant lesions  LABORATORY DATA:  I have reviewed the data as listed    Component Value Date/Time   NA 131 (L) 04/14/2016 1005   NA 136 05/19/2011 1049   K 4.0 04/14/2016 1005   K 4.3 05/19/2011 1049   CL 95 (L) 04/14/2016 1005   CL 99 05/19/2011 1049   CO2 31 04/14/2016 1005   CO2 32 05/19/2011 1049   GLUCOSE 86 04/14/2016 1005   GLUCOSE 116 (H) 05/19/2011 1049   BUN 16 04/14/2016 1005   BUN 8 05/19/2011 1049   CREATININE 0.97 04/14/2016 1005   CREATININE 1.15 12/24/2013 0921   CALCIUM 9.3 04/14/2016 1005   CALCIUM 9.0 05/19/2011 1049   PROT 6.9 04/14/2016 1005   PROT 6.8 12/24/2013 0921   ALBUMIN 4.2 04/14/2016 1005   ALBUMIN 4.0 12/24/2013 0921   AST 20 04/14/2016 1005   AST 23 12/24/2013 0921   ALT 10 (L) 04/14/2016 1005   ALT 19 12/24/2013 0921   ALKPHOS 50 04/14/2016 1005   ALKPHOS 57 12/24/2013 0921   BILITOT 1.4 (H) 04/14/2016 1005   BILITOT 0.9 12/24/2013 0921   GFRNONAA >60 04/14/2016 1005   GFRNONAA >60 12/24/2013 0921   GFRNONAA 50 (L) 09/03/2013 0930   GFRAA >60 04/14/2016 1005   GFRAA >60 12/24/2013 0921   GFRAA 58 (L) 09/03/2013 0930    No results found for: SPEP, UPEP  Lab Results  Component Value Date   WBC 6.6 06/14/2016   NEUTROABS 4.5 06/14/2016   HGB 14.0 06/14/2016   HCT 40.0 06/14/2016   MCV 112.1 (H) 06/14/2016   PLT 274 06/14/2016      Chemistry       Component Value Date/Time   NA 131 (L) 04/14/2016 1005   NA 136 05/19/2011 1049   K 4.0 04/14/2016 1005   K 4.3 05/19/2011 1049   CL 95 (L) 04/14/2016 1005   CL 99 05/19/2011 1049   CO2 31 04/14/2016 1005   CO2 32 05/19/2011 1049   BUN 16 04/14/2016 1005   BUN 8 05/19/2011 1049   CREATININE 0.97 04/14/2016 1005   CREATININE 1.15 12/24/2013 0921      Component Value Date/Time   CALCIUM 9.3 04/14/2016 1005   CALCIUM 9.0 05/19/2011 1049   ALKPHOS 50 04/14/2016 1005   ALKPHOS 57 12/24/2013 0921   AST 20 04/14/2016  1005   AST 23 12/24/2013 0921   ALT 10 (L) 04/14/2016 1005   ALT 19 12/24/2013 0921   BILITOT 1.4 (H) 04/14/2016 1005   BILITOT 0.9 12/24/2013 6770       RADIOGRAPHIC STUDIES: I have personally reviewed the radiological images as listed and agreed with the findings in the report. No results found.   ASSESSMENT & PLAN:   Essential thrombocytosis ([place]) # History of essential thrombocytosis- off Hydrea is March 2018 no concerns for transformation/ or myelofibrosis at this time [no splenomegaly]. Continue to hold Hydrea at this time. CBC today is normal.  # Weight loss- unclear etiology still. Question related to Hydrea versus others. Continue holding Hydrea.   # Elevated Blood pressure- 340- systolic. Recommend monitoring closely. If continues to be elevated 140s 150s at home recommend follow-up with PCP.  # labs in 6 week/ follow up MD/labs in 3 months.   CC: Dr.Tullo.     Cammie Sickle, MD 06/14/2016 1:27 PM

## 2016-06-14 NOTE — Progress Notes (Signed)
Patient is not taking his hydrea. States that he was instructed to stop taking the hydrea at the last office visit with Dr. Rogue Bussing.  Pt reports a 'head cold' r/t seasonal allergies. He took a mucinex last evening. He does not have any fever. Coughing up clear sputum.

## 2016-06-17 ENCOUNTER — Other Ambulatory Visit (INDEPENDENT_AMBULATORY_CARE_PROVIDER_SITE_OTHER): Payer: PPO

## 2016-06-17 DIAGNOSIS — R7989 Other specified abnormal findings of blood chemistry: Secondary | ICD-10-CM

## 2016-06-17 DIAGNOSIS — E538 Deficiency of other specified B group vitamins: Secondary | ICD-10-CM

## 2016-06-17 LAB — VITAMIN B12: Vitamin B-12: 187 pg/mL — ABNORMAL LOW (ref 211–911)

## 2016-06-22 LAB — METHYLMALONIC ACID, SERUM: METHYLMALONIC ACID, QUANT: 634 nmol/L — AB (ref 87–318)

## 2016-06-22 LAB — TESTOS,TOTAL,FREE AND SHBG (FEMALE)
Sex Hormone Binding Glob.: 90 nmol/L — ABNORMAL HIGH (ref 22–77)
TESTOSTERONE,FREE: 120.8 pg/mL (ref 30.0–135.0)
Testosterone,Total,LC/MS/MS: 1549 ng/dL — ABNORMAL HIGH (ref 250–1100)

## 2016-06-24 ENCOUNTER — Ambulatory Visit: Payer: PPO

## 2016-06-24 ENCOUNTER — Other Ambulatory Visit: Payer: Self-pay | Admitting: Internal Medicine

## 2016-06-24 DIAGNOSIS — R7989 Other specified abnormal findings of blood chemistry: Secondary | ICD-10-CM

## 2016-06-24 DIAGNOSIS — D473 Essential (hemorrhagic) thrombocythemia: Secondary | ICD-10-CM

## 2016-06-24 MED ORDER — TESTOSTERONE CYPIONATE 200 MG/ML IM SOLN: 100.0000 mg | INTRAMUSCULAR | 0 refills | Status: DC

## 2016-06-24 NOTE — Assessment & Plan Note (Signed)
Dose reduce to 100 mg  every 14 days.

## 2016-06-27 ENCOUNTER — Other Ambulatory Visit: Payer: Self-pay | Admitting: Internal Medicine

## 2016-06-27 DIAGNOSIS — D473 Essential (hemorrhagic) thrombocythemia: Secondary | ICD-10-CM

## 2016-06-27 MED ORDER — TESTOSTERONE CYPIONATE 200 MG/ML IM SOLN: 50.0000 mg | INTRAMUSCULAR | 0 refills | Status: DC

## 2016-07-01 ENCOUNTER — Ambulatory Visit (INDEPENDENT_AMBULATORY_CARE_PROVIDER_SITE_OTHER): Payer: PPO

## 2016-07-01 DIAGNOSIS — R7989 Other specified abnormal findings of blood chemistry: Secondary | ICD-10-CM

## 2016-07-01 MED ORDER — TESTOSTERONE CYPIONATE 200 MG/ML IM SOLN
50.0000 mg | Freq: Once | INTRAMUSCULAR | Status: AC
  Administered 2016-07-01: 50 mg via INTRAMUSCULAR

## 2016-07-01 NOTE — Progress Notes (Signed)
Patient came in for testosterone injection. Received in left upper outer quadrant.  Patient tolerated well.

## 2016-07-07 DIAGNOSIS — J209 Acute bronchitis, unspecified: Secondary | ICD-10-CM | POA: Diagnosis not present

## 2016-07-15 ENCOUNTER — Ambulatory Visit (INDEPENDENT_AMBULATORY_CARE_PROVIDER_SITE_OTHER): Payer: PPO

## 2016-07-15 DIAGNOSIS — E349 Endocrine disorder, unspecified: Secondary | ICD-10-CM | POA: Diagnosis not present

## 2016-07-15 MED ORDER — TESTOSTERONE CYPIONATE 200 MG/ML IM SOLN
50.0000 mg | Freq: Once | INTRAMUSCULAR | Status: AC
  Administered 2016-07-15: 50 mg via INTRAMUSCULAR

## 2016-07-15 NOTE — Progress Notes (Signed)
Patient came in for Testosterone injection.  Received in Right upper outer Quadrant, IM. Patient tolerated well.

## 2016-07-20 ENCOUNTER — Other Ambulatory Visit: Payer: Self-pay | Admitting: Internal Medicine

## 2016-07-20 NOTE — Progress Notes (Signed)
  I have reviewed the above information and agree with above.   Zeya Balles, MD 

## 2016-07-21 ENCOUNTER — Emergency Department
Admission: EM | Admit: 2016-07-21 | Discharge: 2016-07-21 | Disposition: A | Discharge status: Home / Self Care | Payer: PPO | Attending: Emergency Medicine | Admitting: Emergency Medicine

## 2016-07-21 ENCOUNTER — Emergency Department: Payer: PPO

## 2016-07-21 ENCOUNTER — Encounter: Payer: Self-pay | Admitting: Medical Oncology

## 2016-07-21 DIAGNOSIS — I10 Essential (primary) hypertension: Secondary | ICD-10-CM | POA: Insufficient documentation

## 2016-07-21 DIAGNOSIS — K5900 Constipation, unspecified: Secondary | ICD-10-CM

## 2016-07-21 DIAGNOSIS — Z7982 Long term (current) use of aspirin: Secondary | ICD-10-CM | POA: Diagnosis not present

## 2016-07-21 DIAGNOSIS — Z79899 Other long term (current) drug therapy: Secondary | ICD-10-CM | POA: Insufficient documentation

## 2016-07-21 DIAGNOSIS — K5641 Fecal impaction: Secondary | ICD-10-CM | POA: Insufficient documentation

## 2016-07-21 DIAGNOSIS — K6289 Other specified diseases of anus and rectum: Secondary | ICD-10-CM | POA: Diagnosis not present

## 2016-07-21 MED ORDER — MAGNESIUM CITRATE PO SOLN
1.0000 | Freq: Once | ORAL | Status: AC
  Administered 2016-07-21: 1 via ORAL
  Filled 2016-07-21: qty 296

## 2016-07-21 MED ORDER — SENNOSIDES-DOCUSATE SODIUM 8.6-50 MG PO TABS: 2.0000 | ORAL_TABLET | Freq: Two times a day (BID) | ORAL | 0 refills | Status: DC

## 2016-07-21 NOTE — ED Notes (Signed)
Patient ambulated to commode with x1 staff assist. Patient able to have bowel movement. MD aware.

## 2016-07-21 NOTE — ED Provider Notes (Signed)
[facility] Emergency Department Provider Note  ____________________________________________  Time seen: Approximately 12:39 PM  I have reviewed the triage vital signs and the nursing notes.   HISTORY  Chief Complaint Constipation    HPI Elmer Merwin is a 83 y.o. male who complains of rectal pain for the past 24 hours. He reports his last bowel movement was 3 days ago. Still eating and drinking normally. No vomiting. Has had problems with constipation before. No fevers or chills. No abdominal pain. Rectal pain is constant moderate intensity without aggravating or alleviating factors.     Past Medical History:  Diagnosis Date  . Colon polyps   . Essential thrombocytosis ([place])   . Essential thrombocytosis ([place])   . Hyperlipidemia   . Hypertension   . Lung nodule 12/2004   found on CXR  . Osteoporosis      Patient Active Problem List   Diagnosis Date Noted  . Mass of pancreas 04/23/2016  . Hemoptysis 08/17/2015  . B12 deficiency 08/17/2015  . Testicular insufficiency 08/06/2015  . Low serum testosterone 05/20/2015  . Constipation 03/31/2015  . Pulmonary fibrosis ([place]) 12/12/2014  . Rectal bleeding 12/12/2014  . GI bleed 12/07/2014  . Abnormal weight loss 08/20/2014  . Xerosis of skin 12/18/2013  . Medicare annual wellness visit, subsequent 09/28/2013  . Statin intolerance 09/26/2013  . Pulmonary hypertension, mild ([place]) 12/14/2012  . Rhinitis, nonallergic 12/14/2012  . Solitary pulmonary nodule 10/16/2012  . Heart failure, diastolic, due to HTN ([place]) 10/16/2012  . Bradycardia, sinus 09/02/2012  . Hyponatremia 12/01/2011  . Edema of both legs 10/30/2011  . Essential thrombocytosis ([place])   . Hyperlipidemia   . Hypertension   . Osteoporosis      Past Surgical History:  Procedure Laterality Date  . BONE MARROW BIOPSY  01/18/07  . COLONOSCOPY  2007, 2010   Dr Allen Norris  . NASAL SINUS SURGERY  06/11/05     Prior to Admission  medications   Medication Sig Start Date End Date Taking? Authorizing Provider  aspirin EC 81 MG tablet Take 81 mg by mouth daily. Reported on 08/14/2015   Yes [provider]  benzonatate (TESSALON) 200 MG capsule Take 200 mg by mouth 3 (three) times daily as needed for cough. 07/07/16  Yes [provider]  Calcium Citrate-Vitamin D (CITRACAL + D PO) Take 3 tablets by mouth daily. Take 2 by mouth daily, calcium 630 and vitamin D 500 each.   Yes [provider]  Cholecalciferol (VITAMIN D3) 1000 UNITS CAPS Take 2,000 Units by mouth daily. Take 2 by mouth daily   Yes [provider]  fluticasone (FLONASE) 50 MCG/ACT nasal spray Place 1 spray into both nostrils daily.  03/12/16  Yes [provider]  guaiFENesin (MUCINEX) 600 MG 12 hr tablet Take 600 mg by mouth at bedtime as needed for to loosen phlegm.   Yes [provider]  hydrALAZINE (APRESOLINE) 50 MG tablet TAKE 1 TABLET BY MOUTH 3 TIMES DAILY 05/04/16  Yes Crecencio Mc, MD  lactulose (CHRONULAC) 10 GM/15ML solution TAKE 2 TABLESPOONFULS (30MLS) BY MOUTH EVERY 4 HOURS AS NEEDED FOR CONSTIPATION 07/20/16  Yes Crecencio Mc, MD  lisinopril (PRINIVIL,ZESTRIL) 40 MG tablet TAKE 1 TABLET BY MOUTH ONCE DAILY 05/31/16  Yes Crecencio Mc, MD  testosterone cypionate (DEPOTESTOSTERONE CYPIONATE) 200 MG/ML injection Inject 0.25 mLs (50 mg total) into the muscle every 14 (fourteen) days. 06/27/16  Yes Crecencio Mc, MD  amLODipine (NORVASC) 5 MG tablet TAKE 1  TABLET BY MOUTH ONCE DAILY Patient not taking: Reported on 07/21/2016 05/31/16   Crecencio Mc, MD  hydroxyurea (HYDREA) 500 MG capsule TAKE 1 CAPSULE BY MOUTH ONCE DAILY WITH FOOD TO MINIMIZE GI SIDE EFFECTS Patient not taking: Reported on 06/14/2016 04/21/16   Cammie Sickle, MD  senna-docusate (SENOKOT-S) 8.6-50 MG tablet Take 2 tablets by mouth 2 (two) times daily. 07/21/16   Carrie Mew, MD     Allergies Amlodipine; Atenolol;  Erythromycin; Furosemide; Hctz [hydrochlorothiazide]; Levofloxacin; Metoprolol; Micardis [telmisartan]; Penicillins; Terazosin; and Triamterene-hctz   Family History  Problem Relation Age of Onset  . Colon cancer Mother        colon age 64's  . Peripheral vascular disease Father   . Colon cancer Brother        colon age 16's  . Diabetes Unknown     Social History Social History  Substance Use Topics  . Smoking status: Never Smoker  . Smokeless tobacco: Never Used  . Alcohol use No    Review of Systems  Constitutional:   No fever or chills.  Cardiovascular:   No chest pain or syncope. Respiratory:   No dyspnea or cough. Gastrointestinal:   Negative for abdominal pain, vomiting and diarrhea. Positive rectal pain as above Musculoskeletal:   Negative for focal pain or swelling All other systems reviewed and are negative except as documented above in ROS and HPI.  ____________________________________________   PHYSICAL EXAM:  VITAL SIGNS: ED Triage Vitals  Enc Vitals Group     BP 07/21/16 1026 (!) 149/56     Pulse Rate 07/21/16 1030 66     Resp 07/21/16 1030 17     Temp --      Temp src --      SpO2 07/21/16 1030 96 %     Weight 07/21/16 1009 140 lb (63.5 kg)     Height 07/21/16 1009 _0  (1.651 m)     Head Circumference --      Peak Flow --      Pain Score 07/21/16 1008 7     Pain Loc --      Pain Edu? --      Excl. in [place]? --     Vital signs reviewed, nursing assessments reviewed.   Constitutional:   Alert and oriented. Well appearing and in no distress. Eyes:   No scleral icterus. No conjunctival pallor. PERRL. EOMI.  ENT   Head:   Normocephalic and atraumatic.   Nose:   No congestion/rhinnorhea.    Mouth/Throat:   MMM, no pharyngeal erythema. No peritonsillar mass.    Neck:   No meningismus. Full ROM Hematological/Lymphatic/Immunilogical:   No cervical lymphadenopathy. Cardiovascular:   RRR. Symmetric bilateral radial and DP pulses.  No  murmurs.  Respiratory:   Normal respiratory effort without tachypnea/retractions. Breath sounds are clear and equal bilaterally. No wheezes/rales/rhonchi. Gastrointestinal:   Soft and nontender. Non distended. There is no CVA tenderness.  No rebound, rigidity, or guarding.Rectal exam shows fecal impaction. This was disimpacted at the bedside. Brown stool, Hemoccult negative. Controls okay Genitourinary:   deferred Musculoskeletal:   Normal range of motion in all extremities. No joint effusions.  No lower extremity tenderness.  No edema. Neurologic:   Normal speech and language.  Motor grossly intact. No gross focal neurologic deficits are appreciated.  Skin:    Skin is warm, dry and intact. No rash noted.  No petechiae, purpura, or bullae.  ____________________________________________    LABS (pertinent positives/negatives) (all labs ordered  are listed, but only abnormal results are displayed) Labs Reviewed - No data to display ____________________________________________   EKG    ____________________________________________    RADIOLOGY  Dg Abdomen 1 View  Result Date: 07/21/2016 CLINICAL DATA:  Constipation for 3 days. EXAM: ABDOMEN - 1 VIEW COMPARISON:  CT scan 04/20/2016 FINDINGS: Moderate air throughout small bowel but no distention. Is also scattered air and stool colon and some stool in the rectum. I do not see any findings for significant constipation and. No small bowel obstruction or free air. Soft tissue shadows are grossly maintained. The bony structures are intact. IMPRESSION: Unremarkable bowel gas pattern. No findings for significant constipation or fecal impaction. Electronically Signed   By: Marijo Sanes M.D.   On: 07/21/2016 11:51    ____________________________________________   PROCEDURES Procedures ------------------------------------------------------------------------------------------------------------------- Fecal Disimpaction Procedure  Note:  Performed by me:  Patient placed in the lateral recumbent position with knees drawn towards chest.  Large amount of hard brown stool removed. No complications during procedure.   ------------------------------------------------------------------------------------------------------------------   ____________________________________________   INITIAL IMPRESSION / ASSESSMENT AND PLAN / ED COURSE  Pertinent labs & imaging results that were available during my care of the patient were reviewed by me and considered in my medical decision making (see chart for details).  Patient presents with rectal pain, found to have fecal impaction, consistent with history of constipation. No evidence of bowel obstruction. X-ray unremarkable. Patient had a bowel movement after disimpaction. Discharge home on stool softener.Considering the patient's symptoms, medical history, and physical examination today, I have low suspicion for cholecystitis or biliary pathology, pancreatitis, perforation or bowel obstruction, hernia, intra-abdominal abscess, AAA or dissection, volvulus or intussusception, mesenteric ischemia, or appendicitis.       ____________________________________________   FINAL CLINICAL IMPRESSION(S) / ED DIAGNOSES  Final diagnoses:  Fecal impaction in rectum (HCC)  Constipation, unspecified constipation type      New Prescriptions   SENNA-DOCUSATE (SENOKOT-S) 8.6-50 MG TABLET    Take 2 tablets by mouth 2 (two) times daily.     Portions of this note were generated with dragon dictation software. Dictation errors may occur despite best attempts at proofreading.    Carrie Mew, MD 07/21/16 1242

## 2016-07-21 NOTE — ED Triage Notes (Signed)
Pt reports that he has not had BM in 2-3 days, reports rectal pain.

## 2016-07-21 NOTE — ED Notes (Signed)
Pt given new underwear but refused wanting a wheelchair. Pt verbalized he is able to walk to car.

## 2016-07-23 ENCOUNTER — Ambulatory Visit (INDEPENDENT_AMBULATORY_CARE_PROVIDER_SITE_OTHER): Payer: PPO | Admitting: Internal Medicine

## 2016-07-23 ENCOUNTER — Encounter: Payer: Self-pay | Admitting: Internal Medicine

## 2016-07-23 ENCOUNTER — Telehealth: Payer: Self-pay | Admitting: *Deleted

## 2016-07-23 ENCOUNTER — Ambulatory Visit: Payer: PPO

## 2016-07-23 VITALS — BP 132/56 | HR 65 | Temp 97.8°F | Resp 16 | Ht 65.0 in | Wt 146.4 lb

## 2016-07-23 DIAGNOSIS — E291 Testicular hypofunction: Secondary | ICD-10-CM

## 2016-07-23 DIAGNOSIS — E871 Hypo-osmolality and hyponatremia: Secondary | ICD-10-CM

## 2016-07-23 DIAGNOSIS — I10 Essential (primary) hypertension: Secondary | ICD-10-CM | POA: Diagnosis not present

## 2016-07-23 DIAGNOSIS — D473 Essential (hemorrhagic) thrombocythemia: Secondary | ICD-10-CM | POA: Diagnosis not present

## 2016-07-23 DIAGNOSIS — K5900 Constipation, unspecified: Secondary | ICD-10-CM | POA: Diagnosis not present

## 2016-07-23 DIAGNOSIS — R634 Abnormal weight loss: Secondary | ICD-10-CM

## 2016-07-23 DIAGNOSIS — E538 Deficiency of other specified B group vitamins: Secondary | ICD-10-CM

## 2016-07-23 DIAGNOSIS — J8410 Pulmonary fibrosis, unspecified: Secondary | ICD-10-CM | POA: Diagnosis not present

## 2016-07-23 MED ORDER — CYANOCOBALAMIN 1000 MCG/ML IJ SOLN
1000.0000 ug | Freq: Once | INTRAMUSCULAR | Status: AC
  Administered 2016-07-23: 1000 ug via INTRAMUSCULAR

## 2016-07-23 NOTE — Progress Notes (Signed)
Patient ID: Jimmy Jimenez, male    DOB: 03/13/1925  Age: 83 y.o. MRN: [id number]  The patient is here for follow up examination and management of other chronic and acute problems.  Last seen in June 2017   The risk factors are reflected in the social history.  The roster of all physicians providing medical care to patient - is listed in the Snapshot section of the chart.  Activities of daily living:  The patient is 100% independent in all ADLs: dressing, toileting, feeding as well as independent mobility  Home safety : The patient has smoke detectors in the home. They wear seatbelts.  There are no firearms at home. There is no violence in the home.   There is no risks for hepatitis, STDs or HIV. There is no   history of blood transfusion. They have no travel history to infectious disease endemic areas of the world.  The patient has seen their dentist in the last six month. They have seen their eye doctor in the last year. They admit to slight hearing difficulty with regard to whispered voices and some television programs.  They have deferred audiologic testing in the last year.  They do not  have excessive sun exposure. Discussed the need for sun protection: hats, long sleeves and use of sunscreen if there is significant sun exposure.   Diet: the importance of a healthy diet is discussed. They do have a healthy diet.  The benefits of regular aerobic exercise were discussed. She walks 4 times per week ,  20 minutes.   Depression screen: there are no signs or vegative symptoms of depression- irritability, worsening appetite, anhedonia, sadness/tearfullness.  Cognitive assessment: the patient manages all their financial and personal affairs and is actively engaged. They could relate day,date,year and events; recalled 2/3 objects at 3 minutes; performed clock-face test normally.  The following portions of the patient's history were reviewed and updated as appropriate: allergies, current  medications, past family history, past medical history,  past surgical history, past social history  and problem list.  Visual acuity was not assessed per patient preference since she has regular follow up with her ophthalmologist. Hearing and body mass index were assessed and reviewed.   During the course of the visit the patient was educated and counseled about appropriate screening and preventive services including : fall prevention , diabetes screening, nutrition counseling, colorectal cancer screening, and recommended immunizations.    CC: The primary encounter diagnosis was B12 deficiency. Diagnoses of Essential hypertension, Pulmonary fibrosis ([place]), Constipation, unspecified constipation type, Testicular insufficiency, Essential thrombocytosis ([place]), Hyponatremia, and Abnormal weight loss were also pertinent to this visit.  1) constipation ;  HE WAS treated in ED my 8 for  For fecal impaction.  Plain abdominal films were normal,  He was  manually disimpacted.and given magnesium citrate and sent  Home with instructions to use sennakot s  2)  B12 deficiency: DISCOVERED  last month.  Not addressed  yet  3) Testosterone deficiency:  Supplementation biweekly with IM injections,  With last testostone level very high in April . Dose was reduced and last injection was one week ago.      4) Weight loss: unintentional .  16 lb since last visit June 2017. Oncology has begun a workup and in February referred to GI due to incidental finding of a pancreatic head mass 1.6 cm . Patient is asymptomatic , states that "I fell pretty good."  4) Essential thrombocytosis : managed with Hydrea chronically; however, Hydreae was  suspended  by Oncology   At last visit due to unintentional ongoing  weight loss. [name]  His wife has observed an improvement in his fatigue  and appetite  Over the last month.      History Arlene has a past medical history of Colon polyps; Essential thrombocytosis ([place]); Essential  thrombocytosis ([place]); Hyperlipidemia; Hypertension; Lung nodule (12/2004); and Osteoporosis.   He has a past surgical history that includes Nasal sinus surgery (06/11/05); Colonoscopy (2007, 2010); and Bone marrow biopsy (01/18/07).   His family history includes Colon cancer in his brother and mother; Peripheral vascular disease in his father.He reports that he has never smoked. He has never used smokeless tobacco. He reports that he does not drink alcohol or use drugs.  Outpatient Medications Prior to Visit  Medication Sig Dispense Refill  . amLODipine (NORVASC) 5 MG tablet TAKE 1 TABLET BY MOUTH ONCE DAILY 30 tablet 3  . aspirin EC 81 MG tablet Take 81 mg by mouth daily. Reported on 08/14/2015    . Calcium Citrate-Vitamin D (CITRACAL + D PO) Take 3 tablets by mouth daily. Take 2 by mouth daily, calcium 630 and vitamin D 500 each.    . Cholecalciferol (VITAMIN D3) 1000 UNITS CAPS Take 2,000 Units by mouth daily. Take 2 by mouth daily    . fluticasone (FLONASE) 50 MCG/ACT nasal spray Place 1 spray into both nostrils daily.     [name] guaiFENesin (MUCINEX) 600 MG 12 hr tablet Take 600 mg by mouth at bedtime as needed for to loosen phlegm.    . hydrALAZINE (APRESOLINE) 50 MG tablet TAKE 1 TABLET BY MOUTH 3 TIMES DAILY 90 tablet 3  . lisinopril (PRINIVIL,ZESTRIL) 40 MG tablet TAKE 1 TABLET BY MOUTH ONCE DAILY 30 tablet 3  . senna-docusate (SENOKOT-S) 8.6-50 MG tablet Take 2 tablets by mouth 2 (two) times daily. 120 tablet 0  . testosterone cypionate (DEPOTESTOSTERONE CYPIONATE) 200 MG/ML injection Inject 0.25 mLs (50 mg total) into the muscle every 14 (fourteen) days. 10 mL 0  . lactulose (CHRONULAC) 10 GM/15ML solution TAKE 2 TABLESPOONFULS (30MLS) BY MOUTH EVERY 4 HOURS AS NEEDED FOR CONSTIPATION 946 mL 3  . benzonatate (TESSALON) 200 MG capsule Take 200 mg by mouth 3 (three) times daily as needed for cough.    . hydroxyurea (HYDREA) 500 MG capsule TAKE 1 CAPSULE BY MOUTH ONCE DAILY WITH FOOD TO MINIMIZE  GI SIDE EFFECTS (Patient not taking: Reported on 06/14/2016) 30 capsule 0   No facility-administered medications prior to visit.     Review of Systems  Patient denies headache, fevers, malaise,  skin rash, eye pain, sinus congestion and sinus pain, sore throat, dysphagia,  hemoptysis , cough, dyspnea, wheezing, chest pain, palpitations, orthopnea, edema, abdominal pain, nausea, melena, diarrhea,  flank pain, dysuria, hematuria, urinary  Frequency, nocturia, numbness, tingling, seizures,  Focal weakness, Loss of consciousness,  Tremor, insomnia, depression, anxiety, and suicidal ideation.     Objective:  BP (!) 132/56 (BP Location: Left Arm, Patient Position: Sitting, Cuff Size: Normal)   Pulse 65   Temp 97.8 F (36.6 C) (Oral)   Resp 16   Ht 5' 5"  (1.651 m)   Wt 146 lb 6.4 oz (66.4 kg)   SpO2 96%   BMI 24.36 kg/m   Physical Exam   General appearance: alert, cooperative and appears stated age Ears: normal TM's and external ear canals both ears Throat: lips, mucosa, and tongue normal; teeth and gums normal Neck: no adenopathy, no carotid bruit, supple, symmetrical, trachea midline  and thyroid not enlarged, symmetric, no tenderness/mass/nodules Back: symmetric, no curvature. ROM normal. No CVA tenderness. Lungs: clear to auscultation bilaterally Heart: regular rate and rhythm, S1, S2 normal, no murmur, click, rub or gallop Abdomen: soft, non-tender; bowel sounds normal; no masses,  no organomegaly Pulses: 2+ and symmetric Skin: Skin color, texture, turgor normal. No rashes or lesions Lymph nodes: Cervical, supraclavicular, and axillary nodes normal.   Assessment & Plan:   Problem List Items Addressed This Visit    Testicular insufficiency    Managed with IM testosterone replacement biweekly.  His level was sky high in April mid way between injections and his dose was reduced to 100 mcg       Pulmonary fibrosis ([place])    Diagnosed and monitored by the [facility] hospital with semi  annual PFTs.  He remains asymptomatic , unless his fatigue can be attributed to his condition . He denies shortness of breath      Hyponatremia    Chronic attributed originally to use of hctz, may be SIADh given his pulmonary fibrosis. . Advised to drink gatorade daily   Lab Results  Component Value Date   NA 131 (L) 04/14/2016   K 4.0 04/14/2016   CL 95 (L) 04/14/2016   CO2 31 04/14/2016           Hypertension    Well controlled on current regimen. Renal function stable, hyponatremia stable, no changes today.  Lab Results  Component Value Date   CREATININE 0.97 04/14/2016   Lab Results  Component Value Date   NA 131 (L) 04/14/2016   K 4.0 04/14/2016   CL 95 (L) 04/14/2016   CO2 31 04/14/2016         Essential thrombocytosis ([facility])    No transformation to leukemia per Oncology,  Hydrea suspended due to weight loss.  Lab Results  Component Value Date   WBC 6.6 06/14/2016   HGB 14.0 06/14/2016   HCT 40.0 06/14/2016   MCV 112.1 (H) 06/14/2016   PLT 274 06/14/2016         Constipation    Recurrent,  With recent ER visit for management , which included manual disimpaction.  Recommended daily use of stool softener and a bulk forming fiber laxatives such as metamucil and miralax , followed by sennakot not more than twice per week . Last colonoscopy was in 2010      B12 deficiency - Primary    Recurrent,  Diagnosed initially in June 2017 and treated with IM injections. First of 4 weekly given today,  Folate level pending.  Lab Results  Component Value Date   VITAMINB12 187 (L) 06/17/2016         Relevant Medications   cyanocobalamin ((VITAMIN B-12)) injection 1,000 mcg (Completed)   Other Relevant Orders   Folate RBC (Completed)   Abnormal weight loss    Recurrent,  First noticed in 2016 and improved with addition of remeron for appetite stimulation . Pancreatic mass found during oncology workup,  GI eval in process        A total of 40 minutes was spent  with patient more than half of which was spent in counseling patient on the above mentioned issues , reviewing and explaining recent labs and imaging studies done, and coordination of care.  I have discontinued Mr. Ahlers hydroxyurea, lactulose, and benzonatate. I am also having him maintain his Calcium Citrate-Vitamin D (CITRACAL + D PO), Vitamin D3, aspirin EC, fluticasone, hydrALAZINE, lisinopril, amLODipine, guaiFENesin, testosterone cypionate, and senna-docusate.  We administered cyanocobalamin.  Meds ordered this encounter  Medications  . cyanocobalamin ((VITAMIN B-12)) injection 1,000 mcg    Medications Discontinued During This Encounter  Medication Reason  . lactulose (CHRONULAC) 10 GM/15ML solution Patient has not taken in last 30 days  . benzonatate (TESSALON) 200 MG capsule Patient has not taken in last 30 days  . hydroxyurea (HYDREA) 500 MG capsule Patient has not taken in last 30 days    Follow-up: Return in about 6 months (around 01/23/2017).   Crecencio Mc, MD

## 2016-07-23 NOTE — Patient Instructions (Addendum)
You are VERY LOW IN B12.  We are going to supplement your B12 level with injections once a week for 3 weeks. Then you will need to continue them monthly    To prevent constipation:  Drink 3 16 ounce servings of water  Daily  You cantake miralax, metamucil, fibercon, or citrucel or benefiber daily to supplement your fiber. These are gentle and work in 1 to 2 days to relieve constipation, and  you can also combine them daily with Colace,  A stool softener .  Save the  sennakot and lactulose for when you have not had a BM in 3 days    HERE ARE THE LOW CARB  BREAD CHOICES  THAT ARE HIGHER IN FIBER THAN THE WELL KNOWN BREADS.  The [employer] MADE FROM [employer] HAS 26 G FIBER ! THESE CAN ALLALSO  BE BAKED TO CREATE PITA CHIPS

## 2016-07-23 NOTE — Progress Notes (Signed)
Left a VM to  Return my call added to appt note for next week to have b12 done with testosterone.

## 2016-07-25 LAB — FOLATE RBC: RBC Folate: 509 ng/mL (ref >280)

## 2016-07-25 NOTE — Assessment & Plan Note (Signed)
Diagnosed and monitored by the [facility] hospital with semi annual PFTs.  He remains asymptomatic , unless his fatigue can be attributed to his condition . He denies shortness of breath

## 2016-07-25 NOTE — Assessment & Plan Note (Signed)
Well controlled on current regimen. Renal function stable, hyponatremia stable, no changes today.  Lab Results  Component Value Date   CREATININE 0.97 04/14/2016   Lab Results  Component Value Date   NA 131 (L) 04/14/2016   K 4.0 04/14/2016   CL 95 (L) 04/14/2016   CO2 31 04/14/2016

## 2016-07-25 NOTE — Assessment & Plan Note (Signed)
Managed with IM testosterone replacement biweekly.  His level was sky high in April mid way between injections and his dose was reduced to 100 mcg

## 2016-07-25 NOTE — Assessment & Plan Note (Addendum)
Recurrent,  With recent ER visit for management , which included manual disimpaction.  Recommended daily use of stool softener and a bulk forming fiber laxatives such as metamucil and miralax , followed by sennakot not more than twice per week . Last colonoscopy was in 2010

## 2016-07-25 NOTE — Assessment & Plan Note (Signed)
Chronic attributed originally to use of hctz, may be SIADh given his pulmonary fibrosis. . Advised to drink gatorade daily   Lab Results  Component Value Date   NA 131 (L) 04/14/2016   K 4.0 04/14/2016   CL 95 (L) 04/14/2016   CO2 31 04/14/2016

## 2016-07-25 NOTE — Assessment & Plan Note (Signed)
Recurrent,  Diagnosed initially in June 2017 and treated with IM injections. First of 4 weekly given today,  Folate level pending.  Lab Results  Component Value Date   VITAMINB12 187 (L) 06/17/2016

## 2016-07-25 NOTE — Assessment & Plan Note (Signed)
No transformation to leukemia per Oncology,  Hydrea suspended due to weight loss.  Lab Results  Component Value Date   WBC 6.6 06/14/2016   HGB 14.0 06/14/2016   HCT 40.0 06/14/2016   MCV 112.1 (H) 06/14/2016   PLT 274 06/14/2016

## 2016-07-25 NOTE — Assessment & Plan Note (Signed)
Recurrent,  First noticed in 2016 and improved with addition of remeron for appetite stimulation . Pancreatic mass found during oncology workup,  GI eval in process

## 2016-07-27 ENCOUNTER — Inpatient Hospital Stay: Payer: PPO | Attending: Internal Medicine

## 2016-07-27 DIAGNOSIS — D473 Essential (hemorrhagic) thrombocythemia: Secondary | ICD-10-CM | POA: Insufficient documentation

## 2016-07-27 LAB — CBC WITH DIFFERENTIAL/PLATELET
BASOS PCT: 1 %
Basophils Absolute: 0.1 10*3/uL (ref 0–0.1)
Eosinophils Absolute: 0.1 10*3/uL (ref 0–0.7)
Eosinophils Relative: 2 %
HEMATOCRIT: 43.3 % (ref 40.0–52.0)
Hemoglobin: 15 g/dL (ref 13.0–18.0)
Lymphocytes Relative: 13 %
Lymphs Abs: 1 10*3/uL (ref 1.0–3.6)
MCH: 34.1 pg — ABNORMAL HIGH (ref 26.0–34.0)
MCHC: 34.7 g/dL (ref 32.0–36.0)
MCV: 98.5 fL (ref 80.0–100.0)
MONO ABS: 0.6 10*3/uL (ref 0.2–1.0)
MONOS PCT: 8 %
NEUTROS ABS: 5.6 10*3/uL (ref 1.4–6.5)
Neutrophils Relative %: 76 %
Platelets: 385 10*3/uL (ref 150–440)
RBC: 4.39 MIL/uL — ABNORMAL LOW (ref 4.40–5.90)
RDW: 14.1 % (ref 11.5–14.5)
WBC: 7.4 10*3/uL (ref 3.8–10.6)

## 2016-07-28 NOTE — Progress Notes (Signed)
Attempted to reach patient, gave message to LPN for nurse visit tomorrow to follow up and give message to him to get B12 injections again.

## 2016-07-28 NOTE — Telephone Encounter (Signed)
Cancelled 5/25 appt

## 2016-07-29 ENCOUNTER — Ambulatory Visit (INDEPENDENT_AMBULATORY_CARE_PROVIDER_SITE_OTHER): Payer: PPO

## 2016-07-29 DIAGNOSIS — E538 Deficiency of other specified B group vitamins: Secondary | ICD-10-CM | POA: Diagnosis not present

## 2016-07-29 DIAGNOSIS — R7989 Other specified abnormal findings of blood chemistry: Secondary | ICD-10-CM | POA: Diagnosis not present

## 2016-07-29 MED ORDER — CYANOCOBALAMIN 1000 MCG/ML IJ SOLN
1000.0000 ug | Freq: Once | INTRAMUSCULAR | Status: AC
  Administered 2016-07-29: 1000 ug via INTRAMUSCULAR

## 2016-07-29 MED ORDER — TESTOSTERONE CYPIONATE 200 MG/ML IM SOLN
50.0000 mg | Freq: Once | INTRAMUSCULAR | Status: AC
  Administered 2016-07-29: 50 mg via INTRAMUSCULAR

## 2016-07-29 NOTE — Progress Notes (Addendum)
Patient came in for b12 injection and testosterone.  Reviewed plan for B12 injections for follow through.  Advised to schedule another on for next week.  Gave B12 in left deltoid.  Patient tolerated well.  Gave testosterone in Left upper quadrant IM, patient tolerated well .    I have reviewed the above information and agree with above.   Deborra Medina, MD

## 2016-08-05 ENCOUNTER — Ambulatory Visit (INDEPENDENT_AMBULATORY_CARE_PROVIDER_SITE_OTHER): Payer: PPO | Admitting: *Deleted

## 2016-08-05 ENCOUNTER — Encounter: Payer: Self-pay | Admitting: *Deleted

## 2016-08-05 VITALS — Ht 66.0 in | Wt 146.4 lb

## 2016-08-05 DIAGNOSIS — E538 Deficiency of other specified B group vitamins: Secondary | ICD-10-CM

## 2016-08-05 MED ORDER — CYANOCOBALAMIN 1000 MCG/ML IJ SOLN
1000.0000 ug | Freq: Once | INTRAMUSCULAR | Status: AC
  Administered 2016-08-05: 1000 ug via INTRAMUSCULAR

## 2016-08-05 NOTE — Progress Notes (Signed)
Patient presented for B 12 injection to right deltoid, patient voiced no concerns nor showed any signs of distress during injection. 

## 2016-08-06 DIAGNOSIS — R21 Rash and other nonspecific skin eruption: Secondary | ICD-10-CM | POA: Diagnosis not present

## 2016-08-12 ENCOUNTER — Ambulatory Visit (INDEPENDENT_AMBULATORY_CARE_PROVIDER_SITE_OTHER): Payer: PPO | Admitting: *Deleted

## 2016-08-12 DIAGNOSIS — R7989 Other specified abnormal findings of blood chemistry: Secondary | ICD-10-CM

## 2016-08-12 DIAGNOSIS — E538 Deficiency of other specified B group vitamins: Secondary | ICD-10-CM | POA: Diagnosis not present

## 2016-08-12 MED ORDER — TESTOSTERONE CYPIONATE 200 MG/ML IM SOLN
50.0000 mg | Freq: Once | INTRAMUSCULAR | Status: AC
  Administered 2016-08-12: 50 mg via INTRAMUSCULAR

## 2016-08-12 MED ORDER — CYANOCOBALAMIN 1000 MCG/ML IJ SOLN
1000.0000 ug | Freq: Once | INTRAMUSCULAR | Status: AC
  Administered 2016-08-12: 1000 ug via INTRAMUSCULAR

## 2016-08-12 NOTE — Progress Notes (Signed)
  I have reviewed the above information and agree with above.   Curly Mackowski, MD 

## 2016-08-12 NOTE — Progress Notes (Addendum)
Patient presented for B 12 injection to left deltoid, and testosterone inject to right upper outer quadrant. patient voiced no concerns nor showed any signs of distress during injection.   I have reviewed the above information and agree with above.   Deborra Medina, MD

## 2016-08-19 ENCOUNTER — Ambulatory Visit (INDEPENDENT_AMBULATORY_CARE_PROVIDER_SITE_OTHER): Payer: PPO

## 2016-08-19 DIAGNOSIS — E538 Deficiency of other specified B group vitamins: Secondary | ICD-10-CM | POA: Diagnosis not present

## 2016-08-19 MED ORDER — CYANOCOBALAMIN 1000 MCG/ML IJ SOLN
1000.0000 ug | Freq: Once | INTRAMUSCULAR | Status: AC
  Administered 2016-08-19: 1000 ug via INTRAMUSCULAR

## 2016-08-19 NOTE — Progress Notes (Signed)
Patient came in for a B12 injection.  Received in Right deltoid.  Patient tolerated well.

## 2016-08-26 ENCOUNTER — Ambulatory Visit (INDEPENDENT_AMBULATORY_CARE_PROVIDER_SITE_OTHER): Payer: PPO | Admitting: *Deleted

## 2016-08-26 DIAGNOSIS — R7989 Other specified abnormal findings of blood chemistry: Secondary | ICD-10-CM

## 2016-08-26 MED ORDER — TESTOSTERONE CYPIONATE 200 MG/ML IM SOLN
50.0000 mg | Freq: Once | INTRAMUSCULAR | Status: AC
  Administered 2016-08-26: 50 mg via INTRAMUSCULAR

## 2016-08-26 NOTE — Progress Notes (Signed)
Patient presented for testosterone in jection to LUQ, patient voiced no complaint or concerning during injection.

## 2016-08-31 ENCOUNTER — Emergency Department: Payer: PPO

## 2016-08-31 ENCOUNTER — Encounter: Payer: Self-pay | Admitting: Emergency Medicine

## 2016-08-31 ENCOUNTER — Emergency Department
Admission: EM | Admit: 2016-08-31 | Discharge: 2016-08-31 | Disposition: A | Discharge status: Home / Self Care | Payer: PPO | Source: Home / Self Care | Attending: Emergency Medicine | Admitting: Emergency Medicine

## 2016-08-31 ENCOUNTER — Emergency Department
Admission: EM | Admit: 2016-08-31 | Discharge: 2016-09-01 | Disposition: A | Discharge status: Home / Self Care | Payer: PPO | Attending: Emergency Medicine | Admitting: Emergency Medicine

## 2016-08-31 ENCOUNTER — Encounter: Payer: Self-pay | Admitting: Medical Oncology

## 2016-08-31 DIAGNOSIS — Z7982 Long term (current) use of aspirin: Secondary | ICD-10-CM

## 2016-08-31 DIAGNOSIS — R109 Unspecified abdominal pain: Secondary | ICD-10-CM | POA: Insufficient documentation

## 2016-08-31 DIAGNOSIS — K922 Gastrointestinal hemorrhage, unspecified: Secondary | ICD-10-CM | POA: Diagnosis not present

## 2016-08-31 DIAGNOSIS — K5904 Chronic idiopathic constipation: Secondary | ICD-10-CM | POA: Insufficient documentation

## 2016-08-31 DIAGNOSIS — I10 Essential (primary) hypertension: Secondary | ICD-10-CM

## 2016-08-31 DIAGNOSIS — K625 Hemorrhage of anus and rectum: Secondary | ICD-10-CM | POA: Diagnosis present

## 2016-08-31 DIAGNOSIS — Z79899 Other long term (current) drug therapy: Secondary | ICD-10-CM

## 2016-08-31 DIAGNOSIS — K5909 Other constipation: Secondary | ICD-10-CM | POA: Diagnosis not present

## 2016-08-31 DIAGNOSIS — K5900 Constipation, unspecified: Secondary | ICD-10-CM | POA: Diagnosis not present

## 2016-08-31 DIAGNOSIS — I110 Hypertensive heart disease with heart failure: Secondary | ICD-10-CM | POA: Insufficient documentation

## 2016-08-31 DIAGNOSIS — I5031 Acute diastolic (congestive) heart failure: Secondary | ICD-10-CM

## 2016-08-31 LAB — COMPREHENSIVE METABOLIC PANEL
ALT: 17 U/L (ref 17–63)
ANION GAP: 7 (ref 5–15)
AST: 30 U/L (ref 15–41)
Albumin: 4.4 g/dL (ref 3.5–5.0)
Alkaline Phosphatase: 62 U/L (ref 38–126)
BUN: 14 mg/dL (ref 6–20)
CALCIUM: 9.9 mg/dL (ref 8.9–10.3)
CHLORIDE: 94 mmol/L — AB (ref 101–111)
CO2: 30 mmol/L (ref 22–32)
Creatinine, Ser: 1.03 mg/dL (ref 0.61–1.24)
GFR calc non Af Amer: >60 mL/min (ref >60)
Glucose, Bld: 122 mg/dL — ABNORMAL HIGH (ref 65–99)
POTASSIUM: 4.8 mmol/L (ref 3.5–5.1)
SODIUM: 131 mmol/L — AB (ref 135–145)
Total Bilirubin: 1.8 mg/dL — ABNORMAL HIGH (ref 0.3–1.2)
Total Protein: 7 g/dL (ref 6.5–8.1)

## 2016-08-31 LAB — PROTIME-INR
INR: 1.02
Prothrombin Time: 13.4 seconds (ref 11.4–15.2)

## 2016-08-31 LAB — CBC
HCT: 46.1 % (ref 40.0–52.0)
HEMATOCRIT: 45.1 % (ref 40.0–52.0)
HEMOGLOBIN: 15.5 g/dL (ref 13.0–18.0)
Hemoglobin: 15.5 g/dL (ref 13.0–18.0)
MCH: 32 pg (ref 26.0–34.0)
MCH: 32.6 pg (ref 26.0–34.0)
MCHC: 33.5 g/dL (ref 32.0–36.0)
MCHC: 34.3 g/dL (ref 32.0–36.0)
MCV: 95.4 fL (ref 80.0–100.0)
MCV: 95 fL (ref 80.0–100.0)
Platelets: 329 10*3/uL (ref 150–440)
Platelets: 338 10*3/uL (ref 150–440)
RBC: 4.83 MIL/uL (ref 4.40–5.90)
RBC: 4.75 MIL/uL (ref 4.40–5.90)
RDW: 13.4 % (ref 11.5–14.5)
RDW: 13 % (ref 11.5–14.5)
WBC: 10.5 10*3/uL (ref 3.8–10.6)
WBC: 9.3 10*3/uL (ref 3.8–10.6)

## 2016-08-31 LAB — APTT: APTT: 32 s (ref 24–36)

## 2016-08-31 MED ORDER — IOPAMIDOL (ISOVUE-300) INJECTION 61%
30.0000 mL | Freq: Once | INTRAVENOUS | Status: AC
  Administered 2016-08-31: 30 mL via ORAL

## 2016-08-31 MED ORDER — MAGNESIUM CITRATE PO SOLN
1.0000 | Freq: Once | ORAL | Status: AC
  Administered 2016-08-31: 1 via ORAL
  Filled 2016-08-31: qty 296

## 2016-08-31 MED ORDER — SORBITOL 70 % SOLN
480.0000 mL | TOPICAL_OIL | Freq: Once | ORAL | Status: AC
  Administered 2016-08-31: 480 mL via RECTAL
  Filled 2016-08-31: qty 120

## 2016-08-31 MED ORDER — POLYETHYLENE GLYCOL 3350 17 G PO PACK
17.0000 g | PACK | Freq: Every day | ORAL | Status: DC
  Administered 2016-08-31: 17 g via ORAL
  Filled 2016-08-31: qty 1

## 2016-08-31 MED ORDER — IOPAMIDOL (ISOVUE-300) INJECTION 61%
100.0000 mL | Freq: Once | INTRAVENOUS | Status: AC | PRN
  Administered 2016-08-31: 100 mL via INTRAVENOUS

## 2016-08-31 NOTE — ED Notes (Signed)
Pt went to CT

## 2016-08-31 NOTE — ED Notes (Signed)
Patient still on bedside commode.  States felt like has had some BM but still needs to go.  Will check check back with patient.

## 2016-08-31 NOTE — ED Triage Notes (Signed)
Patient to ER for c/o rectal bleeding. States he was seen here this am for constipation. Had Fleet's enema at home prior to that, received "saline enema" while in ER. States after returning home, he had two episodes of rectal bleeding, "no more than 2 tablespoons amount of blood each. Patient denies any pain, feels like a fair amount of stool has been passed.

## 2016-08-31 NOTE — ED Provider Notes (Signed)
[facility] Emergency Department Provider Note   ____________________________________________   First MD Initiated Contact with Patient 08/31/16 2148     (approximate)  I have reviewed the triage vital signs and the nursing notes.   HISTORY  Chief Complaint Rectal Bleeding    HPI Jimmy Jimenez is a 83 y.o. male here for evaluation of rectal bleeding. He had an enema today as he was very constipated feeling very bloated for 2-3 weeks. Reports he had a large amount of stool and afterwards he knows a very small amount probably a few drops to a tablespoon of blood in the toilet. No pain. No vomiting. No abdominal discomfort or nausea. Reports it seems to have tapered off now, but was concerned as he had never seen blood in his stool before.  Take a baby aspirin daily, denies taking any other blood thinner  Denies any other symptoms. No fevers.   Past Medical History:  Diagnosis Date  . Colon polyps   . 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 Essential thrombocytosis ([place])   . Hyperlipidemia   . Hypertension   . Lung nodule 12/2004   found on CXR  . Osteoporosis     Patient Active Problem List   Diagnosis Date Noted  . Mass of pancreas 04/23/2016  . B12 deficiency 08/17/2015  . Testicular insufficiency 08/06/2015  . Low serum testosterone 05/20/2015  . Constipation 03/31/2015  . Pulmonary fibrosis ([place]) 12/12/2014  . Rectal bleeding 12/12/2014  . Abnormal weight loss 08/20/2014  . Xerosis of skin 12/18/2013  . Medicare annual wellness visit, subsequent 09/28/2013  . Statin intolerance 09/26/2013  . Pulmonary hypertension, mild ([place]) 12/14/2012  . Rhinitis, nonallergic 12/14/2012  . Solitary pulmonary nodule 10/16/2012  . Heart failure, diastolic, due to HTN ([place]) 10/16/2012  . Bradycardia, sinus 09/02/2012  . Hyponatremia 12/01/2011  . Edema of both legs 10/30/2011  . Essential thrombocytosis ([place])   . Hyperlipidemia   . Hypertension   .  Osteoporosis     Past Surgical History:  Procedure Laterality Date  . BONE MARROW BIOPSY  01/18/07  . COLONOSCOPY  2007, 2010   Dr Allen Norris  . NASAL SINUS SURGERY  06/11/05    Prior to Admission medications   Medication Sig Start Date End Date Taking? Authorizing Provider  amLODipine (NORVASC) 5 MG tablet TAKE 1 TABLET BY MOUTH ONCE DAILY 05/31/16   Crecencio Mc, MD  aspirin EC 81 MG tablet Take 81 mg by mouth daily. Reported on 08/14/2015    [provider]  Calcium Citrate-Vitamin D (CITRACAL + D PO) Take 3 tablets by mouth daily. Take 2 by mouth daily, calcium 630 and vitamin D 500 each.    [provider]  Cholecalciferol (VITAMIN D3) 1000 UNITS CAPS Take 2,000 Units by mouth daily. Take 2 by mouth daily    [provider]  fluticasone (FLONASE) 50 MCG/ACT nasal spray Place 1 spray into both nostrils daily.  03/12/16   [provider]  guaiFENesin (MUCINEX) 600 MG 12 hr tablet Take 600 mg by mouth at bedtime as needed for to loosen phlegm.    [provider]  hydrALAZINE (APRESOLINE) 50 MG tablet TAKE 1 TABLET BY MOUTH 3 TIMES DAILY 05/04/16   Crecencio Mc, MD  lisinopril (PRINIVIL,ZESTRIL) 40 MG tablet TAKE 1 TABLET BY MOUTH ONCE DAILY 05/31/16   Crecencio Mc, MD  senna-docusate (SENOKOT-S) 8.6-50 MG tablet Take 2 tablets by mouth 2 (two) times daily. 07/21/16   Carrie Mew, MD  testosterone cypionate (DEPOTESTOSTERONE CYPIONATE) 200 MG/ML injection Inject 0.25 mLs (50 mg total) into the muscle every 14 (fourteen) days. 06/27/16   Crecencio Mc, MD    Allergies Amlodipine; Atenolol; Erythromycin; Furosemide; Hctz [hydrochlorothiazide]; Levofloxacin; Metoprolol; Micardis [telmisartan]; Penicillins; Terazosin; and Triamterene-hctz  Family History  Problem Relation Age of Onset  . Colon cancer Mother        colon age 64's  . Peripheral vascular disease Father   . Colon cancer Brother        colon age 57's  . Diabetes Unknown      Social History Social History  Substance Use Topics  . Smoking status: Never Smoker  . Smokeless tobacco: Never Used  . Alcohol use No    Review of Systems Constitutional: No fever/chills Eyes: No visual changes. ENT: No sore throat. Cardiovascular: Denies chest pain. Respiratory: Denies shortness of breath. Gastrointestinal: Had abdominal pain, felt very constipated for the last 2-3 weeks. See history of present illness Genitourinary: Negative for dysuria. Musculoskeletal: Negative for back pain. Skin: Negative for rash. Neurological: Negative for headaches, focal weakness or numbness.    ____________________________________________   PHYSICAL EXAM:  VITAL SIGNS: ED Triage Vitals  Enc Vitals Group     BP 08/31/16 1933 (!) 141/65     Pulse Rate 08/31/16 1933 80     Resp 08/31/16 1933 20     Temp 08/31/16 1933 (!) 97.4 F (36.3 C)     Temp Source 08/31/16 1933 Oral     SpO2 08/31/16 1933 96 %     Weight 08/31/16 1934 146 lb (66.2 kg)     Height --      Head Circumference --      Peak Flow --      Pain Score 08/31/16 1933 0     Pain Loc --      Pain Edu? --      Excl. in [place]? --     Constitutional: Alert and oriented. Well appearing and in no acute distress. Eyes: Conjunctivae are normal. Head: Atraumatic. Nose: No congestion/rhinnorhea. Mouth/Throat: Mucous membranes are moist. Neck: No stridor.   Cardiovascular: Normal rate, regular rhythm. Grossly normal heart sounds.  Good peripheral circulation. Respiratory: Normal respiratory effort.  No retractions. Lungs CTAB. Gastrointestinal: Soft and nontender. No distention.No discomfort on exam. Rectal exam demonstrates a very slight amount of dark blood and spec's, but no dark or black stool. Minimally heme positive Musculoskeletal: No lower extremity tenderness nor edema. Neurologic:  Normal speech and language. No gross focal neurologic deficits are appreciated.  Skin:  Skin is warm, dry and intact. No rash  noted. Psychiatric: Mood and affect are normal. Speech and behavior are normal.  ____________________________________________   LABS (all labs ordered are listed, but only abnormal results are displayed)  Labs Reviewed  COMPREHENSIVE METABOLIC PANEL - Abnormal; Notable for the following:       Result Value   Sodium 131 (*)    Chloride 94 (*)    Glucose, Bld 122 (*)    Total Bilirubin 1.8 (*)    All other components within normal limits  CBC  CBC  PROTIME-INR  APTT   ____________________________________________  EKG   ____________________________________________  RADIOLOGY  CT pending at time of sign out ____________________________________________   PROCEDURES  Procedure(s) performed: None  Procedures  Critical Care performed: No  ____________________________________________   INITIAL IMPRESSION / ASSESSMENT AND PLAN / ED COURSE  Pertinent labs & imaging results that were available during my care of the patient  were reviewed by me and considered in my medical decision making (see chart for details).  Patient transfer for painless lower GI bleeding. Seems likely related to recent enema and constipation, but given his history and he noted previous pancreatic mass and discussed with the patient and we'll proceed with CT scan to further evaluate for any possible etiology of his bleeding that may be noted by CT imaging. His hemoglobin is stable, he is in no distress, is a reassuring examination, there is no evidence of large bleeding. He takes a baby aspirin daily and no other blood thinner anticoagulant.   Ongoing care assigned to Dr. Owens Shark. Follow-up on CT abdomen and pelvis and repeat hemoglobin and hematocrit. If stable and CT non-concerning, likely discharge to home with bleeding precautions and close follow-up recommended with gastroenterology.     ____________________________________________   FINAL CLINICAL IMPRESSION(S) / ED DIAGNOSES  Final diagnoses:   Rectal bleeding      NEW MEDICATIONS STARTED DURING THIS VISIT:  New Prescriptions   No medications on file     Note:  This document was prepared using Dragon voice recognition software and may include unintentional dictation errors.     Delman Kitten, MD 08/31/16 2251

## 2016-08-31 NOTE — ED Notes (Signed)
Pt had small liquid stool  Pt cleaned up and new gown placed

## 2016-08-31 NOTE — ED Notes (Addendum)
See triage note brought in via family  Per wife he has been constipated for about 2 weeks  Has tried OTC meds and Fleets at home w/o relief  Hx of fecal impaction in May    Wife states he does not use stool softeners or laxatives on a regular basis  No n/v

## 2016-08-31 NOTE — ED Triage Notes (Signed)
Pt reports that it has been 2 weeks since last BM. OTC remedies not helping. Pt c/o pain to rectum only, denies NV.

## 2016-08-31 NOTE — Discharge Instructions (Signed)
You were treated today for constipation. Be sure to increase your fiber intake. Eat foods and snacks like prunes, apples, pears, green leafy veggies, and salads. Drink plenty of water and juices. Consider adding Miralax (polyethylene glycol) to your drink daily. Follow-up with your provider or return to the ED as needed. It was our pleasure to take care of you today.

## 2016-08-31 NOTE — ED Notes (Signed)
Pt returned from CT °

## 2016-08-31 NOTE — ED Provider Notes (Signed)
[facility] Emergency Department Provider Note ____________________________________________  Time seen: 1039  I have reviewed the triage vital signs and the nursing notes.  HISTORY  Chief Complaint  Constipation  HPI Jimmy Jimenez is a 83 y.o. male presents to the ED from home, reporting constipation x 3 weeks. He had the onset of rectal pain with straining, and some mild abdominal pain. This is what prompted him to report to the ED. He reports he usually stools once a week. He is prescribed daily Senokot, but admits to not taking it or any other OTC remedies since onset of symptoms. He denies fevers, chills, sweats, or vomiting. He denies rectal bleeding, syncope, chest pain, or shortness of breath. This is a chronic condition. He was seen here a month ago and treated at bedside with a fecal disimpaction.   Past Medical History:  Diagnosis Date  . Colon polyps   . Essential thrombocytosis ([place])   . Essential thrombocytosis ([place])   . Hyperlipidemia   . Hypertension   . Lung nodule 12/2004   found on CXR  . Osteoporosis     Patient Active Problem List   Diagnosis Date Noted  . Mass of pancreas 04/23/2016  . B12 deficiency 08/17/2015  . Testicular insufficiency 08/06/2015  . Low serum testosterone 05/20/2015  . Constipation 03/31/2015  . Pulmonary fibrosis ([place]) 12/12/2014  . Rectal bleeding 12/12/2014  . Abnormal weight loss 08/20/2014  . Xerosis of skin 12/18/2013  . Medicare annual wellness visit, subsequent 09/28/2013  . Statin intolerance 09/26/2013  . Pulmonary hypertension, mild ([place]) 12/14/2012  . Rhinitis, nonallergic 12/14/2012  . Solitary pulmonary nodule 10/16/2012  . Heart failure, diastolic, due to HTN ([place]) 10/16/2012  . Bradycardia, sinus 09/02/2012  . Hyponatremia 12/01/2011  . Edema of both legs 10/30/2011  . Essential thrombocytosis ([place])   . Hyperlipidemia   . Hypertension   . Osteoporosis     Past Surgical History:   Procedure Laterality Date  . BONE MARROW BIOPSY  01/18/07  . COLONOSCOPY  2007, 2010   Dr Allen Norris  . NASAL SINUS SURGERY  06/11/05    Prior to Admission medications   Medication Sig Start Date End Date Taking? Authorizing Provider  amLODipine (NORVASC) 5 MG tablet TAKE 1 TABLET BY MOUTH ONCE DAILY 05/31/16   Crecencio Mc, MD  aspirin EC 81 MG tablet Take 81 mg by mouth daily. Reported on 08/14/2015    [provider]  Calcium Citrate-Vitamin D (CITRACAL + D PO) Take 3 tablets by mouth daily. Take 2 by mouth daily, calcium 630 and vitamin D 500 each.    [provider]  Cholecalciferol (VITAMIN D3) 1000 UNITS CAPS Take 2,000 Units by mouth daily. Take 2 by mouth daily    [provider]  fluticasone (FLONASE) 50 MCG/ACT nasal spray Place 1 spray into both nostrils daily.  03/12/16   [provider]  guaiFENesin (MUCINEX) 600 MG 12 hr tablet Take 600 mg by mouth at bedtime as needed for to loosen phlegm.    [provider]  hydrALAZINE (APRESOLINE) 50 MG tablet TAKE 1 TABLET BY MOUTH 3 TIMES DAILY 05/04/16   Crecencio Mc, MD  lisinopril (PRINIVIL,ZESTRIL) 40 MG tablet TAKE 1 TABLET BY MOUTH ONCE DAILY 05/31/16   Crecencio Mc, MD  senna-docusate (SENOKOT-S) 8.6-50 MG tablet Take 2 tablets by mouth 2 (two) times daily. 07/21/16   Carrie Mew, MD  testosterone cypionate (DEPOTESTOSTERONE CYPIONATE) 200 MG/ML injection Inject 0.25 mLs (50 mg total) into  the muscle every 14 (fourteen) days. 06/27/16   Crecencio Mc, MD    Allergies Amlodipine; Atenolol; Erythromycin; Furosemide; Hctz [hydrochlorothiazide]; Levofloxacin; Metoprolol; Micardis [telmisartan]; Penicillins; Terazosin; and Triamterene-hctz  Family History  Problem Relation Age of Onset  . Colon cancer Mother        colon age 41's  . Peripheral vascular disease Father   . Colon cancer Brother        colon age 50's  . Diabetes Unknown     Social History Social History  Substance  Use Topics  . Smoking status: Never Smoker  . Smokeless tobacco: Never Used  . Alcohol use No    Review of Systems  Constitutional: Negative for fever. Cardiovascular: Negative for chest pain. Respiratory: Negative for shortness of breath. Gastrointestinal: Negative for abdominal pain, vomiting and diarrhea. Reports constipation as above.  Genitourinary: Negative for dysuria. Musculoskeletal: Negative for back pain. Skin: Negative for rash. Neurological: Negative for headaches, focal weakness or numbness. ____________________________________________  PHYSICAL EXAM:  VITAL SIGNS: ED Triage Vitals  Enc Vitals Group     BP 08/31/16 1000 (!) 156/66     Pulse Rate 08/31/16 1000 78     Resp 08/31/16 1000 18     Temp 08/31/16 1000 97.7 F (36.5 C)     Temp Source 08/31/16 1000 Oral     SpO2 08/31/16 1000 97 %     Weight 08/31/16 0958 146 lb (66.2 kg)     Height --      Head Circumference --      Peak Flow --      Pain Score 08/31/16 0957 7     Pain Loc --      Pain Edu? --      Excl. in [place]? --     Constitutional: Alert and oriented. Well appearing and in no distress. Head: Normocephalic and atraumatic. Cardiovascular: Normal rate, regular rhythm. Normal distal pulses. Respiratory: Normal respiratory effort. No wheezes/rales/rhonchi. Gastrointestinal: Soft and nontender. No distention, rebound, guarding, or ridigity. Normoactive bowel sounds.  Musculoskeletal: Nontender with normal range of motion in all extremities.  Neurologic:  Normal gait without ataxia. Normal speech and language. No gross focal neurologic deficits are appreciated. Skin:  Skin is warm, dry and intact. No rash noted. ____________________________________________   RADIOLOGY  ABD 1 View  IMPRESSION: Moderate stool volume. Nondilated air-filled loops of small bowel noted. Mild adynamic ileus cannot be excluded. Follow-up abdominal series suggested demonstrate resolution.  I, [name], Dannielle Karvonen, personally viewed and evaluated these images (plain radiographs) as part of my medical decision making, as well as reviewing the written report by the radiologist. ____________________________________________  PROCEDURES  Miralax 17 g PO Magnesium Citrate 1 bottle PO SMOG rectal enema ____________________________________________  INITIAL IMPRESSION / ASSESSMENT AND PLAN / ED COURSE  Patient with a evaluation of 3 weeks of constipation, presents for evaluation management. His x-ray does show moderate stool burden. He is treated in the ED with oral stool softeners and an enema which produces moderate evacuation of stool. Patient is discharged with instructions on increasing his fiber intake, daily stool softeners, and walking activities. She should follow-up with his primary care provider ongoing or worsening symptoms. Return to the ED as needed. ____________________________________________  FINAL CLINICAL IMPRESSION(S) / ED DIAGNOSES  Final diagnoses:  Chronic idiopathic constipation     Carmie End, Dannielle Karvonen, PA-C 08/31/16 1345    Lavonia Drafts, MD 08/31/16 1544

## 2016-09-01 MED ORDER — METRONIDAZOLE 500 MG PO TABS: 500.0000 mg | ORAL_TABLET | Freq: Two times a day (BID) | ORAL | 0 refills | Status: AC

## 2016-09-01 MED ORDER — METRONIDAZOLE 500 MG PO TABS
500.0000 mg | ORAL_TABLET | Freq: Once | ORAL | Status: AC
  Administered 2016-09-01: 500 mg via ORAL
  Filled 2016-09-01: qty 1

## 2016-09-01 MED ORDER — METRONIDAZOLE 500 MG PO TABS: 500.0000 mg | ORAL_TABLET | Freq: Once | ORAL | Status: DC

## 2016-09-02 ENCOUNTER — Telehealth: Payer: Self-pay | Admitting: Internal Medicine

## 2016-09-02 NOTE — Telephone Encounter (Signed)
Can you schedule pt for Tuesday July 10th at 4:30pm?

## 2016-09-02 NOTE — Telephone Encounter (Signed)
Pt was in the Ed on 7/3 for constipation and rectal bleeding. He needs a follow up. Please advise, thank you!  Call pt @ [phone] (may leave message)

## 2016-09-02 NOTE — Telephone Encounter (Signed)
Left message stating appt advised as below.

## 2016-09-02 NOTE — Telephone Encounter (Signed)
No these are you but if I can help let me know.

## 2016-09-02 NOTE — Telephone Encounter (Signed)
Do you do the ED follow ups as well or is that something that I can schedule for the pt?

## 2016-09-02 NOTE — Telephone Encounter (Signed)
Patient scheduled.

## 2016-09-03 ENCOUNTER — Other Ambulatory Visit: Payer: Self-pay | Admitting: Internal Medicine

## 2016-09-07 ENCOUNTER — Encounter: Payer: Self-pay | Admitting: Internal Medicine

## 2016-09-07 ENCOUNTER — Ambulatory Visit (INDEPENDENT_AMBULATORY_CARE_PROVIDER_SITE_OTHER): Payer: PPO | Admitting: Internal Medicine

## 2016-09-07 DIAGNOSIS — I5030 Unspecified diastolic (congestive) heart failure: Secondary | ICD-10-CM

## 2016-09-07 DIAGNOSIS — I110 Hypertensive heart disease with heart failure: Secondary | ICD-10-CM | POA: Diagnosis not present

## 2016-09-07 DIAGNOSIS — K5900 Constipation, unspecified: Secondary | ICD-10-CM | POA: Diagnosis not present

## 2016-09-07 DIAGNOSIS — K529 Noninfective gastroenteritis and colitis, unspecified: Secondary | ICD-10-CM

## 2016-09-07 DIAGNOSIS — K625 Hemorrhage of anus and rectum: Secondary | ICD-10-CM

## 2016-09-07 NOTE — Progress Notes (Signed)
Subjective:  Patient ID: Jimmy Jimenez, male    DOB: 03/13/1925  Age: 83 y.o. MRN: [id number]  CC: Diagnoses of Heart failure, diastolic, due to HTN ([place]), Constipation, unspecified constipation type, Rectal bleeding, and Colitis were pertinent to this visit.  HPI [name] presents for ER follow up. Evaluated on July 3 for rectal bleeding that occurred after using an enema to relieve constipation . Rectal exam was minimally heme positive.   CT abd was done , noted Colitis involving the sigmoid and rectosigmoid colon   He reports no recurrent bleeding.  His bowel movements have been solid, and he denies pain with BM .  He has chronic constipation .  Eating prunes and fiber daily .  [name]     Outpatient Medications Prior to Visit  Medication Sig Dispense Refill  . amLODipine (NORVASC) 5 MG tablet TAKE 1 TABLET BY MOUTH ONCE DAILY 30 tablet 3  . aspirin EC 81 MG tablet Take 81 mg by mouth daily. Reported on 08/14/2015    . Calcium Citrate-Vitamin D (CITRACAL + D PO) Take 3 tablets by mouth daily. Take 2 by mouth daily, calcium 630 and vitamin D 500 each.    . Cholecalciferol (VITAMIN D3) 1000 UNITS CAPS Take 2,000 Units by mouth daily. Take 2 by mouth daily    . fluticasone (FLONASE) 50 MCG/ACT nasal spray Place 1 spray into both nostrils daily.     [name] guaiFENesin (MUCINEX) 600 MG 12 hr tablet Take 600 mg by mouth at bedtime as needed for to loosen phlegm.    . hydrALAZINE (APRESOLINE) 50 MG tablet TAKE 1 TABLET BY MOUTH 3 TIMES DAILY 90 tablet 3  . lisinopril (PRINIVIL,ZESTRIL) 40 MG tablet TAKE 1 TABLET BY MOUTH ONCE DAILY 30 tablet 3  . metroNIDAZOLE (FLAGYL) 500 MG tablet Take 1 tablet (500 mg total) by mouth 2 (two) times daily. 20 tablet 0  . senna-docusate (SENOKOT-S) 8.6-50 MG tablet Take 2 tablets by mouth 2 (two) times daily. 120 tablet 0  . testosterone cypionate (DEPOTESTOSTERONE CYPIONATE) 200 MG/ML injection Inject 0.25 mLs (50 mg total) into the muscle every 14 (fourteen) days.  10 mL 0   No facility-administered medications prior to visit.     Review of Systems;  Patient denies headache, fevers, malaise, unintentional weight loss, skin rash, eye pain, sinus congestion and sinus pain, sore throat, dysphagia,  hemoptysis , cough, dyspnea, wheezing, chest pain, palpitations, orthopnea, edema, abdominal pain, nausea, melena, diarrhea, constipation, flank pain, dysuria, hematuria, urinary  Frequency, nocturia, numbness, tingling, seizures,  Focal weakness, Loss of consciousness,  Tremor, insomnia, depression, anxiety, and suicidal ideation.      Objective:  BP 128/64 (BP Location: Left Arm, Patient Position: Sitting, Cuff Size: Normal)   Pulse 70   Temp 98.3 F (36.8 C) (Oral)   Resp 15   Ht 5\' 6"  (1.676 m)   Wt 145 lb 3.2 oz (65.9 kg)   SpO2 94%   BMI 23.44 kg/m   BP Readings from Last 3 Encounters:  09/07/16 128/64  09/01/16 (!) 174/71  08/31/16 (!) 124/50    Wt Readings from Last 3 Encounters:  09/07/16 145 lb 3.2 oz (65.9 kg)  08/31/16 146 lb (66.2 kg)  08/31/16 146 lb (66.2 kg)    General appearance: alert, cooperative and appears stated age Ears: normal TM's and external ear canals both ears Throat: lips, mucosa, and tongue normal; teeth and gums normal Neck: no adenopathy, no carotid bruit, supple, symmetrical, trachea midline and thyroid not enlarged, symmetric,  no tenderness/mass/nodules Back: symmetric, no curvature. ROM normal. No CVA tenderness. Lungs: clear to auscultation bilaterally Heart: regular rate and rhythm, S1, S2 normal, no murmur, click, rub or gallop Abdomen: soft, non-tender; bowel sounds normal; no masses,  no organomegaly Pulses: 2+ and symmetric Skin: Skin color, texture, turgor normal. No rashes or lesions Lymph nodes: Cervical, supraclavicular, and axillary nodes normal.  No results found for: HGBA1C  Lab Results  Component Value Date   CREATININE 1.03 08/31/2016   CREATININE 0.97 04/14/2016   CREATININE 1.20  10/13/2015    Lab Results  Component Value Date   WBC 9.3 08/31/2016   HGB 15.5 08/31/2016   HCT 45.1 08/31/2016   PLT 338 08/31/2016   GLUCOSE 122 (H) 08/31/2016   LDLDIRECT 103.7 09/26/2013   ALT 17 08/31/2016   AST 30 08/31/2016   NA 131 (L) 08/31/2016   K 4.8 08/31/2016   CL 94 (L) 08/31/2016   CREATININE 1.03 08/31/2016   BUN 14 08/31/2016   CO2 30 08/31/2016   TSH 1.95 08/14/2015   PSA 0.88 08/19/2014   INR 1.02 08/31/2016   MICROALBUR 0.7 08/30/2012    Dg Abdomen 1 View  Result Date: 08/31/2016 CLINICAL DATA:  Constipation. EXAM: ABDOMEN - 1 VIEW COMPARISON:  07/21/2016 . FINDINGS: Soft tissue structures are unremarkable. Prominent stool volume. Air-filled loops of small bowel noted. Mild adynamic ileus cannot be excluded. Follow-up abdominal series suggested demonstrate resolution. Degenerative changes and scoliosis lumbar spine. Pelvic calcifications consistent phleboliths . IMPRESSION: Moderate stool volume. Nondilated air-filled loops of small bowel noted. Mild adynamic ileus cannot be excluded. Follow-up abdominal series suggested demonstrate resolution. Electronically Signed   By: Ezel   On: 08/31/2016 11:15   Ct Abdomen Pelvis W Contrast  Result Date: 08/31/2016 CLINICAL DATA:  Lower abdominal bleeding.  Constipation for 3 weeks. EXAM: CT ABDOMEN AND PELVIS WITH CONTRAST TECHNIQUE: Multidetector CT imaging of the abdomen and pelvis was performed using the standard protocol following bolus administration of intravenous contrast. CONTRAST:  125mL ISOVUE-300 IOPAMIDOL (ISOVUE-300) INJECTION 61% COMPARISON:  Radiographs earlier this day.  CT 04/20/2016 FINDINGS: Lower chest: Subpleural reticulation, architectural distortion and peripheral honeycombing in the lung bases. Right greater than left basilar calcifications are unchanged per This is stable from prior exam. Mild cardiomegaly. Coronary artery calcifications. Hepatobiliary: No focal hepatic lesion. Gallbladder  physiologically distended, no calcified stone. No biliary dilatation. Pancreas: 16 mm low-density lesion in the pancreatic head, unchanged from CT and MRI earlier this year. No ductal dilatation or inflammation. Spleen: Normal in size without focal abnormality. Small splenule inferiorly. Adrenals/Urinary Tract: No adrenal nodule. No hydronephrosis or perinephric edema. Two small cysts within right greater than left renal cortex, unchanged. No suspicious renal lesion. Homogeneous enhancement and excretion on delayed phase imaging. Urinary bladder is physiologically distended. Stomach/Bowel: Colonic wall thickening and enhancement involving a moderate length segment of rectosigmoid colon extending to the rectum. Mild pericolonic soft tissue edema. Liquid stool in the more proximal colon without additional colonic wall thickening. Normal appendix. Stomach is physiologically distended. No small bowel inflammation, wall thickening or obstruction. Mild fecalization of distal small bowel contents. Vascular/Lymphatic: Aortic and branch atherosclerosis. No aneurysm. No enlarged abdominal or pelvic lymph nodes. Reproductive:  Unchanged prostate gland enlargement. Other: No ascites.  No free air or abscess. Musculoskeletal: There are no acute or suspicious osseous abnormalities. Scatter tail body hemangiomas. Degenerative change in the lower lumbar spine. IMPRESSION: 1. Colitis involving a moderate length sigmoid and rectosigmoid colon. This may be infectious, inflammatory or  ischemic. Liquid stool in the more proximal colon. 2. Unchanged size of cystic pancreatic head lesion. No acute pancreatic inflammation. Follow-up recommended per prior MRI. 3. Aortic atherosclerosis. Electronically Signed   By: Jeb Levering M.D.   On: 08/31/2016 23:58    Assessment & Plan:   Problem List Items Addressed This Visit    Heart failure, diastolic, due to HTN ([place])    Mild concentric LVH with impaired relaxation.  Tolerating  nebivolol, losartan, lisinopril and hydralazine .  INtolerant of metoprolol., amlodipine .  No changes today      Rectal bleeding    Secondary to constipation induced ischemic colitis., suggested by CT.  Bleeding has resolved.        Constipation    Recommended recommended daily use of Miralax., metamucil, citrucel, benefiber  or fibercon, along with stool softener, and adjust as needed .       Colitis    Etiology presumed to be secondary to ischemia caused by constipation .  He was treated with metronidazole empirically.         A total of 25 minutes of face to face time was spent with patient more than half of which was spent in counselling about the above mentioned conditions  and coordination of care   I am having Mr. Aumiller maintain his Calcium Citrate-Vitamin D (CITRACAL + D PO), Vitamin D3, aspirin EC, fluticasone, hydrALAZINE, amLODipine, guaiFENesin, testosterone cypionate, senna-docusate, metroNIDAZOLE, and lisinopril.  No orders of the defined types were placed in this encounter.   There are no discontinued medications.  Follow-up: Return in about 3 months (around 12/08/2016).   Crecencio Mc, MD

## 2016-09-07 NOTE — Patient Instructions (Addendum)
I want to prevent you  From getting so constipated again   You can take colace up to 200 mg  Every single day to help keep your stools from becoming hard   I advise  You to also to add  a bulk forming laxative (Citrucel, Metamucil, Benefiber, Miralax , or Fibercon) once  a days.  This will help push the stool out .

## 2016-09-09 ENCOUNTER — Ambulatory Visit (INDEPENDENT_AMBULATORY_CARE_PROVIDER_SITE_OTHER): Payer: PPO | Admitting: *Deleted

## 2016-09-09 DIAGNOSIS — K529 Noninfective gastroenteritis and colitis, unspecified: Secondary | ICD-10-CM | POA: Insufficient documentation

## 2016-09-09 DIAGNOSIS — R7989 Other specified abnormal findings of blood chemistry: Secondary | ICD-10-CM | POA: Diagnosis not present

## 2016-09-09 MED ORDER — TESTOSTERONE CYPIONATE 200 MG/ML IM SOLN
50.0000 mg | Freq: Once | INTRAMUSCULAR | Status: AC
  Administered 2016-09-09: 50 mg via INTRAMUSCULAR

## 2016-09-09 NOTE — Progress Notes (Addendum)
Patient presented for testosterone injection to RUQ, patient voiced no concerns nor showed any signs of distress during injection.  Reviewed.  Dr Nicki Reaper

## 2016-09-09 NOTE — Assessment & Plan Note (Signed)
Secondary to constipation induced ischemic colitis., suggested by CT.  Bleeding has resolved.

## 2016-09-09 NOTE — Assessment & Plan Note (Signed)
Mild concentric LVH with impaired relaxation.  Tolerating nebivolol, losartan, lisinopril and hydralazine .  INtolerant of metoprolol., amlodipine .  No changes today

## 2016-09-09 NOTE — Assessment & Plan Note (Addendum)
Recommended recommended daily use of Miralax., metamucil, citrucel, benefiber  or fibercon, along with stool softener, and adjust as needed .

## 2016-09-09 NOTE — Assessment & Plan Note (Signed)
Etiology presumed to be secondary to ischemia caused by constipation .  He was treated with metronidazole empirically.

## 2016-09-13 ENCOUNTER — Inpatient Hospital Stay: Payer: PPO | Admitting: Internal Medicine

## 2016-09-13 ENCOUNTER — Inpatient Hospital Stay: Payer: PPO

## 2016-09-15 ENCOUNTER — Inpatient Hospital Stay: Payer: PPO

## 2016-09-15 ENCOUNTER — Inpatient Hospital Stay: Payer: PPO | Attending: Internal Medicine | Admitting: Internal Medicine

## 2016-09-15 VITALS — BP 172/71 | HR 64 | Temp 96.3°F | Resp 18 | Ht 66.0 in | Wt 144.5 lb

## 2016-09-15 DIAGNOSIS — Z7982 Long term (current) use of aspirin: Secondary | ICD-10-CM

## 2016-09-15 DIAGNOSIS — R634 Abnormal weight loss: Secondary | ICD-10-CM

## 2016-09-15 DIAGNOSIS — Z880 Allergy status to penicillin: Secondary | ICD-10-CM

## 2016-09-15 DIAGNOSIS — Z8673 Personal history of transient ischemic attack (TIA), and cerebral infarction without residual deficits: Secondary | ICD-10-CM

## 2016-09-15 DIAGNOSIS — M810 Age-related osteoporosis without current pathological fracture: Secondary | ICD-10-CM

## 2016-09-15 DIAGNOSIS — Z800 Family history of malignant neoplasm of digestive organs: Secondary | ICD-10-CM

## 2016-09-15 DIAGNOSIS — Z79899 Other long term (current) drug therapy: Secondary | ICD-10-CM

## 2016-09-15 DIAGNOSIS — E785 Hyperlipidemia, unspecified: Secondary | ICD-10-CM | POA: Diagnosis not present

## 2016-09-15 DIAGNOSIS — D473 Essential (hemorrhagic) thrombocythemia: Secondary | ICD-10-CM

## 2016-09-15 DIAGNOSIS — Z888 Allergy status to other drugs, medicaments and biological substances status: Secondary | ICD-10-CM

## 2016-09-15 DIAGNOSIS — K862 Cyst of pancreas: Secondary | ICD-10-CM

## 2016-09-15 DIAGNOSIS — Z86010 Personal history of colonic polyps: Secondary | ICD-10-CM | POA: Diagnosis not present

## 2016-09-15 DIAGNOSIS — I10 Essential (primary) hypertension: Secondary | ICD-10-CM

## 2016-09-15 LAB — CBC WITH DIFFERENTIAL/PLATELET
Basophils Absolute: 0.1 10*3/uL (ref 0–0.1)
Basophils Relative: 1 %
Eosinophils Absolute: 0.2 10*3/uL (ref 0–0.7)
Eosinophils Relative: 2 %
HEMATOCRIT: 42 % (ref 40.0–52.0)
Hemoglobin: 14.5 g/dL (ref 13.0–18.0)
LYMPHS ABS: 1.1 10*3/uL (ref 1.0–3.6)
LYMPHS PCT: 16 %
MCH: 32.4 pg (ref 26.0–34.0)
MCHC: 34.6 g/dL (ref 32.0–36.0)
MCV: 93.5 fL (ref 80.0–100.0)
MONO ABS: 0.6 10*3/uL (ref 0.2–1.0)
MONOS PCT: 9 %
NEUTROS ABS: 4.8 10*3/uL (ref 1.4–6.5)
Neutrophils Relative %: 72 %
Platelets: 385 10*3/uL (ref 150–440)
RBC: 4.49 MIL/uL (ref 4.40–5.90)
RDW: 13 % (ref 11.5–14.5)
WBC: 6.7 10*3/uL (ref 3.8–10.6)

## 2016-09-15 LAB — BASIC METABOLIC PANEL
Anion gap: 5 (ref 5–15)
BUN: 12 mg/dL (ref 6–20)
CO2: 32 mmol/L (ref 22–32)
CREATININE: 0.99 mg/dL (ref 0.61–1.24)
Calcium: 9.7 mg/dL (ref 8.9–10.3)
Chloride: 96 mmol/L — ABNORMAL LOW (ref 101–111)
GFR calc Af Amer: >60 mL/min (ref >60)
GFR calc non Af Amer: >60 mL/min (ref >60)
GLUCOSE: 122 mg/dL — AB (ref 65–99)
Potassium: 4 mmol/L (ref 3.5–5.1)
Sodium: 133 mmol/L — ABNORMAL LOW (ref 135–145)

## 2016-09-15 NOTE — Assessment & Plan Note (Addendum)
#   History of essential thrombocytosis- off Hydrea is March 2018. Today CBC is within normal limits. Platelets normal.  # Weight loss- unclear etiology -currently stable.   # Pancreatic cyst- recommend surveillance MRI in March 2019.   # Elevated Blood pressure- 953- systolic. Better at home as per pt. Defer to PCP.  # labs in  3 months; labs in 6 months/ MD  CC: Dr.Tullo.

## 2016-09-15 NOTE — Progress Notes (Signed)
[place] OFFICE PROGRESS NOTE  Patient Care Team: Crecencio Mc, MD as PCP - General (Internal Medicine) Bary Castilla, Forest Gleason, MD as Consulting Physician (General Surgery) Crecencio Mc, MD as Referring Physician (Internal Medicine) Leia Alf, MD (Inactive) as Medical Oncologist (Internal Medicine)   SUMMARY OF HEMATOLOGIC HISTORY:  # 2008- ESSENTIAL THROMBOCYTOSIS- Hydrea 563m/d; [Dr.Pandit] NOV 2008- BMBx- slight increase in enlarged megakayocytes; bcr-abl-Neg;Jak-2 NEG; MPL/CALR-NEG; March 2018- OFF Hydrea [? Sec to weight loss/fatigue]  # previous TIA   # March 2018- pancreatic cyst  [MRI]; recommend MRI in March 2019   INTERVAL HISTORY:  83year old male patient with above history of essential thrombocytosis currently on surveillance is here for follow-up. Patient's Hydrea has been held since march 2018- because of poor tolerance last weight loss  Patient's appetite is good; his weight is stable. Patient denies any recent blood clots or any strokes or TIAs. Denies any burning pain in his extremities. Otherwise no nausea vomiting.  REVIEW OF SYSTEMS:  A complete 10 point review of system is done which is negative except mentioned above/history of present illness.   PAST MEDICAL HISTORY :  Past Medical History:  Diagnosis Date  . Colon polyps   . [place] ([place]   . [place] ([place]   . Hyperlipidemia   . Hypertension   . Lung nodule 12/2004   found on CXR  . Osteoporosis     PAST SURGICAL HISTORY :   Past Surgical History:  Procedure Laterality Date  . BONE MARROW BIOPSY  01/18/07  . COLONOSCOPY  2007, 2010   Dr WAllen Norris . NASAL SINUS SURGERY  06/11/05    FAMILY HISTORY :   Family History  Problem Relation Age of Onset  . Colon cancer Mother        colon age 81's . Peripheral vascular disease Father   . Colon cancer Brother        colon age 81's . Diabetes Unknown     SOCIAL HISTORY:   Social History   Substance Use Topics  . Smoking status: Never Smoker  . Smokeless tobacco: Never Used  . Alcohol use No    ALLERGIES:  is allergic to amlodipine; atenolol; erythromycin; furosemide; hctz [hydrochlorothiazide]; levofloxacin; metoprolol; micardis [telmisartan]; penicillins; terazosin; and triamterene-hctz.  MEDICATIONS:  Current Outpatient Prescriptions  Medication Sig Dispense Refill  . amLODipine (NORVASC) 5 MG tablet TAKE 1 TABLET BY MOUTH ONCE DAILY (Patient taking differently: TAKE 1 TABLET BY MOUTH ONCE DAILY as needed based on blood pressure levels) 30 tablet 3  . aspirin EC 81 MG tablet Take 81 mg by mouth daily. Reported on 08/14/2015    . Calcium Citrate-Vitamin D (CITRACAL + D PO) Take 3 tablets by mouth daily. Take 2 by mouth daily, calcium 630 and vitamin D 500 each.    . Cholecalciferol (VITAMIN D3) 1000 UNITS CAPS Take 2,000 Units by mouth daily. Take 2 by mouth daily    . docusate sodium (COLACE) 100 MG capsule Take 100 mg by mouth 2 (two) times daily as needed for mild constipation.    . hydrALAZINE (APRESOLINE) 50 MG tablet TAKE 1 TABLET BY MOUTH 3 TIMES DAILY 90 tablet 3  . lisinopril (PRINIVIL,ZESTRIL) 40 MG tablet TAKE 1 TABLET BY MOUTH ONCE DAILY 30 tablet 3  . fluticasone (FLONASE) 50 MCG/ACT nasal spray Place 1 spray into both nostrils daily.     .Marland Kitchentestosterone cypionate (DEPOTESTOSTERONE CYPIONATE) 200 MG/ML injection Inject 0.25 mLs (50 mg total) into the  muscle every 14 (fourteen) days. 10 mL 0   No current facility-administered medications for this visit.     PHYSICAL EXAMINATION:   BP (!) 172/71 (Patient Position: Sitting)   Pulse 64   Temp (!) 96.3 F (35.7 C) (Tympanic)   Resp 18   Ht _0  (1.676 m)   Wt 144 lb 8 oz (65.5 kg)   BMI 23.32 kg/m   Filed Weights   09/15/16 0908  Weight: 144 lb 8 oz (65.5 kg)    GENERAL: Well-nourished well-developed; Alert, no distress and comfortable. Accompanied by his wife. EYES: no pallor or  icterus OROPHARYNX: no thrush or ulceration; good dentition  NECK: supple, no masses felt LYMPH:  no palpable lymphadenopathy in the cervical, axillary or inguinal regions LUNGS: clear to auscultation and  No wheeze or crackles HEART/CVS: regular rate & rhythm and no murmurs; No lower extremity edema ABDOMEN:abdomen soft, non-tender and normal bowel sounds Musculoskeletal:no cyanosis of digits and no clubbing  PSYCH: alert & oriented x 3 with fluent speech NEURO: no focal motor/sensory deficits SKIN:  no rashes or significant lesions  LABORATORY DATA:  I have reviewed the data as listed    Component Value Date/Time   NA 133 (L) 09/15/2016 0840   NA 136 05/19/2011 1049   K 4.0 09/15/2016 0840   K 4.3 05/19/2011 1049   CL 96 (L) 09/15/2016 0840   CL 99 05/19/2011 1049   CO2 32 09/15/2016 0840   CO2 32 05/19/2011 1049   GLUCOSE 122 (H) 09/15/2016 0840   GLUCOSE 116 (H) 05/19/2011 1049   BUN 12 09/15/2016 0840   BUN 8 05/19/2011 1049   CREATININE 0.99 09/15/2016 0840   CREATININE 1.15 12/24/2013 0921   CALCIUM 9.7 09/15/2016 0840   CALCIUM 9.0 05/19/2011 1049   PROT 7.0 08/31/2016 1938   PROT 6.8 12/24/2013 0921   ALBUMIN 4.4 08/31/2016 1938   ALBUMIN 4.0 12/24/2013 0921   AST 30 08/31/2016 1938   AST 23 12/24/2013 0921   ALT 17 08/31/2016 1938   ALT 19 12/24/2013 0921   ALKPHOS 62 08/31/2016 1938   ALKPHOS 57 12/24/2013 0921   BILITOT 1.8 (H) 08/31/2016 1938   BILITOT 0.9 12/24/2013 0921   GFRNONAA >60 09/15/2016 0840   GFRNONAA >60 12/24/2013 0921   GFRNONAA 50 (L) 09/03/2013 0930   GFRAA >60 09/15/2016 0840   GFRAA >60 12/24/2013 0921   GFRAA 58 (L) 09/03/2013 0930    No results found for: SPEP, UPEP  Lab Results  Component Value Date   WBC 6.7 09/15/2016   NEUTROABS 4.8 09/15/2016   HGB 14.5 09/15/2016   HCT 42.0 09/15/2016   MCV 93.5 09/15/2016   PLT 385 09/15/2016      Chemistry      Component Value Date/Time   NA 133 (L) 09/15/2016 0840   NA 136  05/19/2011 1049   K 4.0 09/15/2016 0840   K 4.3 05/19/2011 1049   CL 96 (L) 09/15/2016 0840   CL 99 05/19/2011 1049   CO2 32 09/15/2016 0840   CO2 32 05/19/2011 1049   BUN 12 09/15/2016 0840   BUN 8 05/19/2011 1049   CREATININE 0.99 09/15/2016 0840   CREATININE 1.15 12/24/2013 0921      Component Value Date/Time   CALCIUM 9.7 09/15/2016 0840   CALCIUM 9.0 05/19/2011 1049   ALKPHOS 62 08/31/2016 1938   ALKPHOS 57 12/24/2013 0921   AST 30 08/31/2016 1938   AST 23 12/24/2013 0921   ALT 17  08/31/2016 1938   ALT 19 12/24/2013 0921   BILITOT 1.8 (H) 08/31/2016 1938   BILITOT 0.9 12/24/2013 0921       RADIOGRAPHIC STUDIES: I have personally reviewed the radiological images as listed and agreed with the findings in the report. No results found.   ASSESSMENT & PLAN:   Essential thrombocytosis ([place]) # History of essential thrombocytosis- off Hydrea is March 2018. Today CBC is within normal limits. Platelets normal.  # Weight loss- unclear etiology -currently stable.   # Pancreatic cyst- recommend surveillance MRI in March 2019.   # Elevated Blood pressure- 945- systolic. Better at home as per pt. Defer to PCP.  # labs in  3 months; labs in 6 months/ MD  CC: Dr.Tullo.     Cammie Sickle, MD 09/15/2016 1:04 PM

## 2016-09-23 ENCOUNTER — Ambulatory Visit (INDEPENDENT_AMBULATORY_CARE_PROVIDER_SITE_OTHER): Payer: PPO

## 2016-09-23 DIAGNOSIS — R7989 Other specified abnormal findings of blood chemistry: Secondary | ICD-10-CM | POA: Diagnosis not present

## 2016-09-23 DIAGNOSIS — E538 Deficiency of other specified B group vitamins: Secondary | ICD-10-CM

## 2016-09-23 MED ORDER — CYANOCOBALAMIN 1000 MCG/ML IJ SOLN
1000.0000 ug | Freq: Once | INTRAMUSCULAR | Status: AC
  Administered 2016-09-23: 1000 ug via INTRAMUSCULAR

## 2016-09-23 MED ORDER — TESTOSTERONE ENANTHATE 200 MG/ML IM SOLN
50.0000 mg | INTRAMUSCULAR | Status: DC
  Administered 2016-09-23 – 2016-10-07 (×2): 50 mg via INTRAMUSCULAR

## 2016-09-23 NOTE — Progress Notes (Signed)
Patient comes in for B 12 injection .  Injected left deltoid.  Patient tolerated injection well.   Also presented for testosterone injection .  Left upper outer quadrant injected.  Patient tolerated injection well.

## 2016-10-07 ENCOUNTER — Ambulatory Visit (INDEPENDENT_AMBULATORY_CARE_PROVIDER_SITE_OTHER): Payer: PPO

## 2016-10-07 DIAGNOSIS — R7989 Other specified abnormal findings of blood chemistry: Secondary | ICD-10-CM | POA: Diagnosis not present

## 2016-10-07 MED ORDER — TESTOSTERONE CYPIONATE 200 MG/ML IM SOLN: 50.0000 mg | Freq: Once | INTRAMUSCULAR | Status: DC

## 2016-10-07 NOTE — Progress Notes (Signed)
Patient comes in today for Testosterone injection. Injected into RUQ. Patient tolerated injection well

## 2016-10-13 DIAGNOSIS — J069 Acute upper respiratory infection, unspecified: Secondary | ICD-10-CM | POA: Diagnosis not present

## 2016-10-21 ENCOUNTER — Ambulatory Visit (INDEPENDENT_AMBULATORY_CARE_PROVIDER_SITE_OTHER): Payer: PPO | Admitting: *Deleted

## 2016-10-21 DIAGNOSIS — R7989 Other specified abnormal findings of blood chemistry: Secondary | ICD-10-CM | POA: Diagnosis not present

## 2016-10-21 MED ORDER — TESTOSTERONE CYPIONATE 200 MG/ML IM SOLN
50.0000 mg | Freq: Once | INTRAMUSCULAR | Status: AC
  Administered 2016-10-21: 50 mg via INTRAMUSCULAR

## 2016-10-21 NOTE — Progress Notes (Addendum)
Patient presented for testosterone injection to LUQ IM patient tolerated well and voiced no concern nor complaint during injection.  Reviewed.  Dr Nicki Reaper

## 2016-10-27 ENCOUNTER — Other Ambulatory Visit: Payer: Self-pay | Admitting: Internal Medicine

## 2016-10-29 ENCOUNTER — Telehealth: Payer: Self-pay | Admitting: Internal Medicine

## 2016-10-29 NOTE — Telephone Encounter (Signed)
Left pt message asking to call Ebony Hail back directly at [phone] to schedule AWV. Thanks!  *NOTE* Last AWV 01/21/15

## 2016-11-04 ENCOUNTER — Ambulatory Visit (INDEPENDENT_AMBULATORY_CARE_PROVIDER_SITE_OTHER): Payer: PPO | Admitting: *Deleted

## 2016-11-04 DIAGNOSIS — E538 Deficiency of other specified B group vitamins: Secondary | ICD-10-CM | POA: Diagnosis not present

## 2016-11-04 DIAGNOSIS — R7989 Other specified abnormal findings of blood chemistry: Secondary | ICD-10-CM

## 2016-11-04 MED ORDER — CYANOCOBALAMIN 1000 MCG/ML IJ SOLN
1000.0000 ug | Freq: Once | INTRAMUSCULAR | Status: AC
  Administered 2016-11-04: 1000 ug via INTRAMUSCULAR

## 2016-11-04 MED ORDER — TESTOSTERONE CYPIONATE 200 MG/ML IM SOLN
50.0000 mg | Freq: Once | INTRAMUSCULAR | Status: AC
  Administered 2016-11-04: 50 mg via INTRAMUSCULAR

## 2016-11-04 NOTE — Progress Notes (Addendum)
Patient presented for B 12 injection to right deltoid, patient voiced no concerns nor showed any signs of distress during injection. Patient also received testosterone injection  Into RUQ and voice no concerns or complaints.  Reviewed  Dr Nicki Reaper

## 2016-11-18 ENCOUNTER — Ambulatory Visit (INDEPENDENT_AMBULATORY_CARE_PROVIDER_SITE_OTHER): Payer: PPO | Admitting: *Deleted

## 2016-11-18 DIAGNOSIS — R7989 Other specified abnormal findings of blood chemistry: Secondary | ICD-10-CM

## 2016-11-18 MED ORDER — TESTOSTERONE CYPIONATE 200 MG/ML IM SOLN
50.0000 mg | Freq: Once | INTRAMUSCULAR | Status: AC
  Administered 2016-11-18: 50 mg via INTRAMUSCULAR

## 2016-11-18 NOTE — Progress Notes (Addendum)
Patient presented for testosterone injection to LUQ, patient voiced no concerns, nor showed any signs of distress during injection.   I have reviewed the above information and agree with above.   Deborra Medina, MD

## 2016-11-29 DIAGNOSIS — J069 Acute upper respiratory infection, unspecified: Secondary | ICD-10-CM | POA: Diagnosis not present

## 2016-12-08 NOTE — Telephone Encounter (Signed)
delined

## 2016-12-09 ENCOUNTER — Ambulatory Visit (INDEPENDENT_AMBULATORY_CARE_PROVIDER_SITE_OTHER): Payer: PPO

## 2016-12-09 DIAGNOSIS — E538 Deficiency of other specified B group vitamins: Secondary | ICD-10-CM | POA: Diagnosis not present

## 2016-12-09 DIAGNOSIS — R7989 Other specified abnormal findings of blood chemistry: Secondary | ICD-10-CM | POA: Diagnosis not present

## 2016-12-09 MED ORDER — TESTOSTERONE ENANTHATE 200 MG/ML IM SOLN
50.0000 mg | Freq: Once | INTRAMUSCULAR | Status: AC
  Administered 2016-12-09: 50 mg via INTRAMUSCULAR

## 2016-12-09 MED ORDER — CYANOCOBALAMIN 1000 MCG/ML IJ SOLN
1000.0000 ug | Freq: Once | INTRAMUSCULAR | Status: AC
  Administered 2016-12-09: 1000 ug via INTRAMUSCULAR

## 2016-12-10 NOTE — Progress Notes (Signed)
  I have reviewed the above information and agree with above.   Cynithia Hakimi, MD 

## 2016-12-15 ENCOUNTER — Inpatient Hospital Stay: Payer: PPO | Attending: Internal Medicine

## 2016-12-15 DIAGNOSIS — D473 Essential (hemorrhagic) thrombocythemia: Secondary | ICD-10-CM | POA: Insufficient documentation

## 2016-12-21 ENCOUNTER — Inpatient Hospital Stay: Payer: PPO

## 2016-12-21 DIAGNOSIS — D473 Essential (hemorrhagic) thrombocythemia: Secondary | ICD-10-CM | POA: Diagnosis not present

## 2016-12-21 DIAGNOSIS — J019 Acute sinusitis, unspecified: Secondary | ICD-10-CM | POA: Diagnosis not present

## 2016-12-21 LAB — CBC WITH DIFFERENTIAL/PLATELET
Basophils Absolute: 0.1 10*3/uL (ref 0–0.1)
Basophils Relative: 1 %
EOS ABS: 0.1 10*3/uL (ref 0–0.7)
Eosinophils Relative: 2 %
HCT: 44.8 % (ref 40.0–52.0)
HEMOGLOBIN: 15.1 g/dL (ref 13.0–18.0)
LYMPHS ABS: 1.8 10*3/uL (ref 1.0–3.6)
LYMPHS PCT: 23 %
MCH: 31.4 pg (ref 26.0–34.0)
MCHC: 33.8 g/dL (ref 32.0–36.0)
MCV: 92.9 fL (ref 80.0–100.0)
MONOS PCT: 9 %
Monocytes Absolute: 0.7 10*3/uL (ref 0.2–1.0)
NEUTROS PCT: 65 %
Neutro Abs: 5 10*3/uL (ref 1.4–6.5)
Platelets: 445 10*3/uL — ABNORMAL HIGH (ref 150–440)
RBC: 4.83 MIL/uL (ref 4.40–5.90)
RDW: 13 % (ref 11.5–14.5)
WBC: 7.6 10*3/uL (ref 3.8–10.6)

## 2016-12-23 ENCOUNTER — Ambulatory Visit (INDEPENDENT_AMBULATORY_CARE_PROVIDER_SITE_OTHER): Payer: PPO | Admitting: *Deleted

## 2016-12-23 DIAGNOSIS — R7989 Other specified abnormal findings of blood chemistry: Secondary | ICD-10-CM | POA: Diagnosis not present

## 2016-12-23 MED ORDER — TESTOSTERONE CYPIONATE 200 MG/ML IM SOLN
50.0000 mg | Freq: Once | INTRAMUSCULAR | Status: AC
  Administered 2016-12-23: 50 mg via INTRAMUSCULAR

## 2016-12-23 NOTE — Progress Notes (Signed)
Patient presented for testosterone injection to LUQ patient tolerated injection well, voiced no concerns.

## 2016-12-30 ENCOUNTER — Other Ambulatory Visit: Payer: Self-pay | Admitting: Internal Medicine

## 2017-01-01 DIAGNOSIS — R0981 Nasal congestion: Secondary | ICD-10-CM | POA: Diagnosis not present

## 2017-01-01 DIAGNOSIS — B3742 Candidal balanitis: Secondary | ICD-10-CM | POA: Diagnosis not present

## 2017-01-01 DIAGNOSIS — J019 Acute sinusitis, unspecified: Secondary | ICD-10-CM | POA: Diagnosis not present

## 2017-01-01 DIAGNOSIS — R0989 Other specified symptoms and signs involving the circulatory and respiratory systems: Secondary | ICD-10-CM | POA: Diagnosis not present

## 2017-01-06 ENCOUNTER — Ambulatory Visit: Payer: PPO | Admitting: *Deleted

## 2017-01-06 DIAGNOSIS — R7989 Other specified abnormal findings of blood chemistry: Secondary | ICD-10-CM | POA: Diagnosis not present

## 2017-01-06 MED ORDER — TESTOSTERONE CYPIONATE 200 MG/ML IM SOLN
50.0000 mg | Freq: Once | INTRAMUSCULAR | Status: AC
  Administered 2017-01-06: 50 mg via INTRAMUSCULAR

## 2017-01-06 NOTE — Progress Notes (Signed)
Patient presented for testosterone injection to right upper outer quadrant , patient tolerated well.

## 2017-01-13 ENCOUNTER — Ambulatory Visit: Payer: PPO

## 2017-01-17 DIAGNOSIS — J019 Acute sinusitis, unspecified: Secondary | ICD-10-CM | POA: Diagnosis not present

## 2017-01-17 DIAGNOSIS — N481 Balanitis: Secondary | ICD-10-CM | POA: Diagnosis not present

## 2017-01-17 DIAGNOSIS — B9689 Other specified bacterial agents as the cause of diseases classified elsewhere: Secondary | ICD-10-CM | POA: Diagnosis not present

## 2017-01-26 ENCOUNTER — Ambulatory Visit (INDEPENDENT_AMBULATORY_CARE_PROVIDER_SITE_OTHER): Payer: PPO | Admitting: *Deleted

## 2017-01-26 DIAGNOSIS — E871 Hypo-osmolality and hyponatremia: Secondary | ICD-10-CM

## 2017-01-26 DIAGNOSIS — E538 Deficiency of other specified B group vitamins: Secondary | ICD-10-CM

## 2017-01-26 DIAGNOSIS — R7989 Other specified abnormal findings of blood chemistry: Secondary | ICD-10-CM

## 2017-01-26 MED ORDER — CYANOCOBALAMIN 1000 MCG/ML IJ SOLN
1000.0000 ug | Freq: Once | INTRAMUSCULAR | Status: AC
  Administered 2017-01-26: 1000 ug via INTRAMUSCULAR

## 2017-01-26 MED ORDER — TESTOSTERONE CYPIONATE 200 MG/ML IM SOLN
50.0000 mg | Freq: Once | INTRAMUSCULAR | Status: AC
  Administered 2017-01-26: 50 mg via INTRAMUSCULAR

## 2017-01-26 NOTE — Progress Notes (Signed)
Patient presented for B 12 injection to left deltoid, patient voiced no concerns nor showed any signs of distress during injection  Received Testosterone injection in LUQ, patient voiced no concern's nor showed any sign of distress during injection.

## 2017-02-01 ENCOUNTER — Other Ambulatory Visit: Payer: Self-pay | Admitting: Internal Medicine

## 2017-02-09 ENCOUNTER — Ambulatory Visit: Payer: PPO

## 2017-02-11 ENCOUNTER — Other Ambulatory Visit: Payer: Self-pay | Admitting: Pharmacist

## 2017-02-11 ENCOUNTER — Other Ambulatory Visit: Payer: Self-pay | Admitting: Pharmacy Technician

## 2017-02-11 DIAGNOSIS — J019 Acute sinusitis, unspecified: Secondary | ICD-10-CM | POA: Diagnosis not present

## 2017-02-11 DIAGNOSIS — B9689 Other specified bacterial agents as the cause of diseases classified elsewhere: Secondary | ICD-10-CM | POA: Diagnosis not present

## 2017-02-11 DIAGNOSIS — N481 Balanitis: Secondary | ICD-10-CM | POA: Diagnosis not present

## 2017-02-11 NOTE — Patient Outreach (Signed)
[facility]) Care Management  02/11/2017  Jimmy Jimenez 03/13/1925 [id number]  ADDENDUM:  Jimmy Jimenez spoke with the patient in reference to the previous conversation that I had with him and his spouse. The medication he stopped was Amlodipine not Lisinopril.  Doreene Burke, [place] [phone]

## 2017-02-11 NOTE — Patient Outreach (Signed)
Jimmy Jimenez is referred to pharmacist by pharmacy technician Doreene Burke regarding his medication adherence. Per referral, patient has stopped taking his blood pressure medication (lisinopril) due to a commercial that he saw on TV about the medication being recalled for causing cancer. Called and spoke with patient. HIPAA identifiers verified and verbal consent received. Mr. Hayashi gives permission that I also speak with his wife.  Mr and Mrs. Faniel report that it was the patient's amlodipine, rather than lisinopril, that he has stopped taking due to his concerns about the recall. Mrs. Kamer reports that patient does not normally take the amlodipine everyday, only if his systolic blood pressure is to go over 150. Review FDA recall website to confirm recall status of amlodipine. Counsel patient and his wife that they may have heard the commercial about the recall of valsartan, which also warns about combination tablets that contain both valsartan and amlodipine. Counsel about this recall. Let patient know that at this time, no recalls of amlodipine alone are active per the FDA recall website. Mr and Mrs. Roskelley verbalize understanding. Patient and his wife express appreciation for this clarification so that they know that the patient's medication was not affected. Mr and Mrs. Goodbar deny any further medication questions/concerns at this time.  Counsel on the importance of medication adherence to the patient's blood pressure medications. Mrs. Schliep denies the patient missing any doses of his lisinopril and reports that the patient picked up his refill of this medication for the month.  Provide patient with my phone number for future questions.  Harlow Asa, PharmD, [facility] [phone]

## 2017-02-11 NOTE — Patient Outreach (Signed)
[facility]) Care Management  02/11/2017  Jimmy Jimenez 03/13/1925 [id number]  Incoming call from the spouse of [name] in reference to medication adherence. HIPAA identifiers verified and verbal consent received. The medication in question is Lisinopril 40 mg and the patient has discontinued the medication. He states that he saw a commercial which stated that this medication can cause cancer. He has not discussed the discontinuation with Dr. Derrel Nip. I have referred him to [facility] [place] and she will follow-up with the patient today.  Doreene Burke, [place] [phone]

## 2017-02-14 ENCOUNTER — Other Ambulatory Visit: Payer: Self-pay | Admitting: Internal Medicine

## 2017-02-14 DIAGNOSIS — D473 Essential (hemorrhagic) thrombocythemia: Secondary | ICD-10-CM

## 2017-02-15 ENCOUNTER — Other Ambulatory Visit: Payer: Self-pay | Admitting: Internal Medicine

## 2017-02-15 DIAGNOSIS — Z79899 Other long term (current) drug therapy: Secondary | ICD-10-CM

## 2017-02-15 DIAGNOSIS — R7989 Other specified abnormal findings of blood chemistry: Secondary | ICD-10-CM

## 2017-02-15 DIAGNOSIS — D473 Essential (hemorrhagic) thrombocythemia: Secondary | ICD-10-CM

## 2017-02-15 MED ORDER — TESTOSTERONE CYPIONATE 200 MG/ML IM SOLN: 50.0000 mg | INTRAMUSCULAR | 0 refills | Status: DC

## 2017-02-15 NOTE — Telephone Encounter (Signed)
Refilled: 06/27/2016 Last OV: 09/07/2016 Next OV: not scheduled

## 2017-02-15 NOTE — Telephone Encounter (Signed)
Refilled,  But he is overdue for labs.  Needs an early mornign appt for testosterone level

## 2017-02-16 ENCOUNTER — Telehealth: Payer: Self-pay

## 2017-02-16 ENCOUNTER — Ambulatory Visit (INDEPENDENT_AMBULATORY_CARE_PROVIDER_SITE_OTHER): Payer: PPO | Admitting: *Deleted

## 2017-02-16 DIAGNOSIS — E538 Deficiency of other specified B group vitamins: Secondary | ICD-10-CM

## 2017-02-16 MED ORDER — CYANOCOBALAMIN 1000 MCG/ML IJ SOLN
1000.0000 ug | Freq: Once | INTRAMUSCULAR | Status: AC
  Administered 2017-02-16: 1000 ug via INTRAMUSCULAR

## 2017-02-16 NOTE — Progress Notes (Signed)
Patient presented for B 12 injection to right deltoid, patient voiced no concerns nor showed any signs of distress during injection. 

## 2017-02-16 NOTE — Telephone Encounter (Signed)
rx has been faxed. LMTCB. Need to schedule pt an early morning lab appt to have testosterone level checked.   PEC my speak with pt and schedule appt.

## 2017-02-16 NOTE — Telephone Encounter (Signed)
Copied from [place] [phone]. Topic: Quick Communication - Office Called Patient >> Feb 16, 2017 11:35 AM Darl Householder, RMA wrote: Reason for CRM: patient is returning call to Janett Billow, please have Janett Billow call pt at [phone]

## 2017-02-17 NOTE — Telephone Encounter (Signed)
Spoke with pt's wife to schedule the pt a lab appt. Pt's wife is aware of the appt date and time.

## 2017-02-18 NOTE — Telephone Encounter (Signed)
See rx response message.

## 2017-02-23 ENCOUNTER — Telehealth: Payer: Self-pay | Admitting: Internal Medicine

## 2017-02-23 ENCOUNTER — Ambulatory Visit: Payer: PPO

## 2017-02-23 NOTE — Telephone Encounter (Signed)
Patient's wife called in to verify lab appointment scheduled for 02/25/17 at 10 am. She says that he is supposed to be there at 8:30-8:45 am, per appointment notes entered, and she's not understanding why a message was left telling him to be there at 10 am. I notified Kerin Salen, LPN Flow manager and she clarified that he has to be there early for a fasting lab and the appointment is just entered into a slot. Patient's wife was made aware that the patient will need to be at the lab appointment at 8:30am, she verbalized understanding.

## 2017-02-24 ENCOUNTER — Other Ambulatory Visit: Payer: Self-pay | Admitting: Internal Medicine

## 2017-02-25 ENCOUNTER — Other Ambulatory Visit (INDEPENDENT_AMBULATORY_CARE_PROVIDER_SITE_OTHER): Payer: PPO

## 2017-02-25 DIAGNOSIS — R7989 Other specified abnormal findings of blood chemistry: Secondary | ICD-10-CM

## 2017-02-25 DIAGNOSIS — Z79899 Other long term (current) drug therapy: Secondary | ICD-10-CM

## 2017-02-25 LAB — COMPREHENSIVE METABOLIC PANEL
ALT: 20 U/L (ref 0–53)
AST: 23 U/L (ref 0–37)
Albumin: 4.4 g/dL (ref 3.5–5.2)
Alkaline Phosphatase: 62 U/L (ref 39–117)
BUN: 19 mg/dL (ref 6–23)
CHLORIDE: 99 meq/L (ref 96–112)
CO2: 35 meq/L — AB (ref 19–32)
CREATININE: 1.08 mg/dL (ref 0.40–1.50)
Calcium: 10 mg/dL (ref 8.4–10.5)
GFR: 82.61 mL/min (ref >60.00)
GLUCOSE: 97 mg/dL (ref 70–99)
POTASSIUM: 4.7 meq/L (ref 3.5–5.1)
SODIUM: 138 meq/L (ref 135–145)
Total Bilirubin: 1.2 mg/dL (ref 0.2–1.2)
Total Protein: 6.9 g/dL (ref 6.0–8.3)

## 2017-02-25 LAB — CBC WITH DIFFERENTIAL/PLATELET
BASOS PCT: 1 % (ref 0.0–3.0)
Basophils Absolute: 0.1 10*3/uL (ref 0.0–0.1)
EOS PCT: 3.1 % (ref 0.0–5.0)
Eosinophils Absolute: 0.2 10*3/uL (ref 0.0–0.7)
HCT: 43.3 % (ref 39.0–52.0)
HEMOGLOBIN: 14.2 g/dL (ref 13.0–17.0)
LYMPHS ABS: 1.4 10*3/uL (ref 0.7–4.0)
Lymphocytes Relative: 19.7 % (ref 12.0–46.0)
MCHC: 32.7 g/dL (ref 30.0–36.0)
MCV: 96.7 fl (ref 78.0–100.0)
MONO ABS: 0.7 10*3/uL (ref 0.1–1.0)
Monocytes Relative: 9.2 % (ref 3.0–12.0)
NEUTROS ABS: 4.9 10*3/uL (ref 1.4–7.7)
NEUTROS PCT: 67 % (ref 43.0–77.0)
PLATELETS: 421 10*3/uL — AB (ref 150.0–400.0)
RBC: 4.48 Mil/uL (ref 4.22–5.81)
RDW: 13.1 % (ref 11.5–15.5)
WBC: 7.3 10*3/uL (ref 4.0–10.5)

## 2017-02-28 LAB — TESTOS,TOTAL,FREE AND SHBG (FEMALE)
Free Testosterone: 9 pg/mL — ABNORMAL LOW (ref 30.0–135.0)
SEX HORMONE BINDING: 112 nmol/L — AB (ref 22–77)
Testosterone, Total, LC-MS-MS: 176 ng/dL — ABNORMAL LOW (ref 250–1100)

## 2017-03-03 ENCOUNTER — Ambulatory Visit (INDEPENDENT_AMBULATORY_CARE_PROVIDER_SITE_OTHER): Payer: PPO | Admitting: *Deleted

## 2017-03-03 DIAGNOSIS — E349 Endocrine disorder, unspecified: Secondary | ICD-10-CM | POA: Diagnosis not present

## 2017-03-03 DIAGNOSIS — E538 Deficiency of other specified B group vitamins: Secondary | ICD-10-CM | POA: Diagnosis not present

## 2017-03-03 DIAGNOSIS — J019 Acute sinusitis, unspecified: Secondary | ICD-10-CM | POA: Diagnosis not present

## 2017-03-03 DIAGNOSIS — J329 Chronic sinusitis, unspecified: Secondary | ICD-10-CM | POA: Diagnosis not present

## 2017-03-03 DIAGNOSIS — B9689 Other specified bacterial agents as the cause of diseases classified elsewhere: Secondary | ICD-10-CM | POA: Diagnosis not present

## 2017-03-03 MED ORDER — CYANOCOBALAMIN 1000 MCG/ML IJ SOLN
1000.0000 ug | Freq: Once | INTRAMUSCULAR | Status: AC
Start: 2017-03-03 — End: 2017-03-03
  Administered 2017-03-03: 1000 ug via INTRAMUSCULAR

## 2017-03-03 MED ORDER — TESTOSTERONE CYPIONATE 200 MG/ML IM SOLN
50.0000 mg | INTRAMUSCULAR | Status: DC
  Administered 2017-03-03 – 2017-05-26 (×2): 50 mg via INTRAMUSCULAR

## 2017-03-03 NOTE — Progress Notes (Addendum)
Patient presented for B 12 injection to left deltoid, patient voiced no concerns nor showed any signs of distress during injection  Testosterone injection given Upper Right quadrant (RUQ) patient tolerated well.   I have reviewed the above information and agree with above.   Deborra Medina, MD

## 2017-03-10 ENCOUNTER — Ambulatory Visit: Payer: PPO | Admitting: Urology

## 2017-03-10 ENCOUNTER — Encounter: Payer: Self-pay | Admitting: Urology

## 2017-03-10 VITALS — BP 181/64 | HR 71 | Ht 66.0 in | Wt 145.6 lb

## 2017-03-10 DIAGNOSIS — N471 Phimosis: Secondary | ICD-10-CM

## 2017-03-10 LAB — URINALYSIS, COMPLETE
BILIRUBIN UA: NEGATIVE
GLUCOSE, UA: NEGATIVE
KETONES UA: NEGATIVE
Leukocytes, UA: NEGATIVE
NITRITE UA: NEGATIVE
RBC UA: NEGATIVE
SPEC GRAV UA: 1.015 (ref 1.005–1.030)
UUROB: 0.2 mg/dL (ref 0.2–1.0)
pH, UA: 7.5 (ref 5.0–7.5)

## 2017-03-10 LAB — MICROSCOPIC EXAMINATION: Epithelial Cells (non renal): NONE SEEN /hpf (ref 0–10)

## 2017-03-10 MED ORDER — CLOTRIMAZOLE-BETAMETHASONE 1-0.05 % EX CREA: 1.0000 "application " | TOPICAL_CREAM | Freq: Two times a day (BID) | CUTANEOUS | 0 refills | Status: DC

## 2017-03-10 NOTE — Progress Notes (Signed)
03/10/2017 1:34 PM   Jimmy Jimenez 03/13/1925 [id number]  Referring provider: Crecencio Mc, MD [place], [place] [address]  Chief Complaint  Patient presents with  . New Patient (Initial Visit)    Balanitis    HPI: The patient is a 84 year old gentleman who presents today for evaluation of balanitis.  Patient had 51-monthhistory of irritation and edema on his glands as well as the inner surface of his foreskin.  He is uncircumcised.  He did not have this problem prior.  He has tried some creams but is not sure what it was.  He is not sure if it helped.  He feels is uncomfortable and tender to the touch.  He is very bothered by this.  He has no other previous genitourinary history.  Nothing makes it better.  Nothing makes it worse.   PMH: Past Medical History:  Diagnosis Date  . Colon polyps   . Essential thrombocytosis ([place]   . Essential thrombocytosis ([place]   . Hyperlipidemia   . Hypertension   . Lung nodule 12/2004   found on CXR  . Osteoporosis     Surgical History: Past Surgical History:  Procedure Laterality Date  . BONE MARROW BIOPSY  01/18/07  . COLONOSCOPY  2007, 2010   Dr WAllen Norris . NASAL SINUS SURGERY  06/11/05    Home Medications:  Allergies as of 03/10/2017      Reactions   Amlodipine    swelling   Atenolol    swelling   Erythromycin    Other reaction(s): Hallucination   Furosemide    Dizziness   Hctz [hydrochlorothiazide]    hyponatremia   Levofloxacin    Causes sleepiness   Metoprolol    dizziness   Micardis [telmisartan]    Causes swelling   Penicillins Itching   Has patient had a PCN reaction causing immediate rash, facial/tongue/throat swelling, SOB or lightheadedness with hypotension: No Has patient had a PCN reaction causing severe rash involving mucus membranes or skin necrosis: No Has patient had a PCN reaction that required hospitalization No Has patient had a PCN reaction occurring within the last 10  years: No If all of the above answers are "NO", then may proceed with Cephalosporin use.   Terazosin    Causes swelling in legs and feet   Triamterene-hctz    Patient stated is causes low sodium      Medication List        Accurate as of 03/10/17  1:34 PM. Always use your most recent med list.          amLODipine 5 MG tablet Commonly known as:  NORVASC TAKE 1 TABLET BY MOUTH ONCE DAILY   aspirin EC 81 MG tablet Take 81 mg by mouth daily. Reported on 08/14/2015   CITRACAL + D PO Take 3 tablets by mouth daily. Take 2 by mouth daily, calcium 630 and vitamin D 500 each.   clotrimazole-betamethasone cream Commonly known as:  LOTRISONE Apply 1 application topically 2 (two) times daily.   docusate sodium 100 MG capsule Commonly known as:  COLACE Take 100 mg by mouth 2 (two) times daily as needed for mild constipation.   fluticasone 50 MCG/ACT nasal spray Commonly known as:  FLONASE Place 1 spray into both nostrils daily.   hydrALAZINE 50 MG tablet Commonly known as:  APRESOLINE TAKE 1 TABLET BY MOUTH 3 TIMES DAILY   lisinopril 40 MG tablet Commonly known as:  PRINIVIL,ZESTRIL TAKE 1 TABLET BY  MOUTH ONCE DAILY   testosterone cypionate 200 MG/ML injection Commonly known as:  DEPOTESTOSTERONE CYPIONATE Inject 0.25 mLs (50 mg total) into the muscle every 14 (fourteen) days.   Vitamin D3 1000 units Caps Take 2,000 Units by mouth daily. Take 2 by mouth daily       Allergies:  Allergies  Allergen Reactions  . Amlodipine     swelling  . Atenolol     swelling  . Erythromycin     Other reaction(s): Hallucination  . Furosemide     Dizziness   . Hctz [Hydrochlorothiazide]     hyponatremia  . Levofloxacin     Causes sleepiness  . Metoprolol     dizziness  . Micardis [Telmisartan]     Causes swelling  . Penicillins Itching    Has patient had a PCN reaction causing immediate rash, facial/tongue/throat swelling, SOB or lightheadedness with hypotension: No Has  patient had a PCN reaction causing severe rash involving mucus membranes or skin necrosis: No Has patient had a PCN reaction that required hospitalization No Has patient had a PCN reaction occurring within the last 10 years: No If all of the above answers are "NO", then may proceed with Cephalosporin use.  . Terazosin     Causes swelling in legs and feet  . Triamterene-Hctz     Patient stated is causes low sodium    Family History: Family History  Problem Relation Age of Onset  . Colon cancer Mother        colon age 20's  . Peripheral vascular disease Father   . Colon cancer Brother        colon age 61's  . Diabetes Unknown   . Prostate cancer Neg Hx   . Bladder Cancer Neg Hx   . Kidney cancer Neg Hx     Social History:  reports that  has never smoked. he has never used smokeless tobacco. He reports that he does not drink alcohol or use drugs.  ROS: UROLOGY Frequent Urination?: No Hard to postpone urination?: Yes Burning/pain with urination?: No Get up at night to urinate?: Yes Leakage of urine?: No Urine stream starts and stops?: No Trouble starting stream?: Yes Do you have to strain to urinate?: No Blood in urine?: No Urinary tract infection?: No Sexually transmitted disease?: No Injury to kidneys or bladder?: No Painful intercourse?: No Weak stream?: No Erection problems?: Yes Penile pain?: No  Gastrointestinal Nausea?: No Vomiting?: No Indigestion/heartburn?: No Diarrhea?: No Constipation?: No  Constitutional Fever: No Night sweats?: No Weight loss?: No Fatigue?: No  Skin Skin rash/lesions?: No Itching?: No  Eyes Blurred vision?: No Double vision?: No  Ears/Nose/Throat Sore throat?: No Sinus problems?: Yes  Hematologic/Lymphatic Swollen glands?: No Easy bruising?: No  Cardiovascular Leg swelling?: No Chest pain?: No  Respiratory Cough?: No Shortness of breath?: No  Endocrine Excessive thirst?: No  Musculoskeletal Back pain?:  No Joint pain?: No  Neurological Headaches?: No Dizziness?: No  Psychologic Depression?: No Anxiety?: No  Physical Exam: BP (!) 181/64 (BP Location: Right Arm, Patient Position: Sitting, Cuff Size: Normal)   Pulse 71   Ht 5' 6"  (1.676 m)   Wt 145 lb 9.6 oz (66 kg)   BMI 23.50 kg/m   Constitutional:  Alert and oriented, No acute distress. HEENT: [place] AT, moist mucus membranes.  Trachea midline, no masses. Cardiovascular: No clubbing, cyanosis, or edema. Respiratory: Normal respiratory effort, no increased work of breathing. GI: Abdomen is soft, nontender, nondistended, no abdominal masses GU: No CVA tenderness.  Patient with paraphimosis on initial examination.  There is mild erythema of both the inner surface of his foreskin and glans.  There is also mild edema.  Testicles descended equal bilaterally.  Benign.  Foreskin reduced appropriately Skin: No rashes, bruises or suspicious lesions. Lymph: No cervical or inguinal adenopathy. Neurologic: Grossly intact, no focal deficits, moving all 4 extremities. Psychiatric: Normal mood and affect.  Laboratory Data: Lab Results  Component Value Date   WBC 7.3 02/25/2017   HGB 14.2 02/25/2017   HCT 43.3 02/25/2017   MCV 96.7 02/25/2017   PLT 421.0 (H) 02/25/2017    Lab Results  Component Value Date   CREATININE 1.08 02/25/2017    Lab Results  Component Value Date   PSA 0.88 08/19/2014    Lab Results  Component Value Date   TESTOSTERONE 111 (L) 05/19/2015    No results found for: HGBA1C  Urinalysis    Component Value Date/Time   COLORURINE YELLOW 08/29/2014 1050   APPEARANCEUR CLEAR 08/29/2014 1050   LABSPEC 1.015 08/29/2014 1050   PHURINE 6.0 08/29/2014 1050   GLUCOSEU NEGATIVE 08/29/2014 1050   HGBUR NEGATIVE 08/29/2014 1050   BILIRUBINUR NEGATIVE 08/29/2014 1050   BILIRUBINUR neg 08/29/2014 1048   KETONESUR NEGATIVE 08/29/2014 1050   PROTEINUR neg 08/29/2014 1048   UROBILINOGEN 0.2 08/29/2014 1050   NITRITE  NEGATIVE 08/29/2014 [place] 08/29/2014 1050    Assessment & Plan:    1.  Balanitis 2.  Paraphimosis I did discuss the patient that he had a paraphimosis on evaluation today.  This is absolutely contributing to his issues.  The patient was unaware that his foreskin need to be reduced at all times.  I discussed this with him and showed him how to make sure his foreskin was in the correct orientation.  I will also start him on Lotrisone cream at this time.  He will follow-up in 4 weeks.  If he is still symptomatic in 4 weeks, he will need to consider circumcision as his only remaining option.  Return in about 4 weeks (around 04/07/2017).  Nickie Retort, MD  [facility] [address], [place], [place] [address] [phone]

## 2017-03-16 ENCOUNTER — Inpatient Hospital Stay: Payer: PPO | Attending: Internal Medicine

## 2017-03-16 ENCOUNTER — Encounter: Payer: Self-pay | Admitting: Internal Medicine

## 2017-03-16 ENCOUNTER — Inpatient Hospital Stay (HOSPITAL_BASED_OUTPATIENT_CLINIC_OR_DEPARTMENT_OTHER): Payer: PPO | Admitting: Internal Medicine

## 2017-03-16 VITALS — BP 184/66 | HR 66 | Temp 98.1°F | Resp 16 | Wt 145.0 lb

## 2017-03-16 DIAGNOSIS — Z888 Allergy status to other drugs, medicaments and biological substances status: Secondary | ICD-10-CM | POA: Insufficient documentation

## 2017-03-16 DIAGNOSIS — M810 Age-related osteoporosis without current pathological fracture: Secondary | ICD-10-CM

## 2017-03-16 DIAGNOSIS — Z8673 Personal history of transient ischemic attack (TIA), and cerebral infarction without residual deficits: Secondary | ICD-10-CM | POA: Insufficient documentation

## 2017-03-16 DIAGNOSIS — D473 Essential (hemorrhagic) thrombocythemia: Secondary | ICD-10-CM

## 2017-03-16 DIAGNOSIS — Z881 Allergy status to other antibiotic agents status: Secondary | ICD-10-CM | POA: Diagnosis not present

## 2017-03-16 DIAGNOSIS — Z86010 Personal history of colonic polyps: Secondary | ICD-10-CM

## 2017-03-16 DIAGNOSIS — Z800 Family history of malignant neoplasm of digestive organs: Secondary | ICD-10-CM | POA: Diagnosis not present

## 2017-03-16 DIAGNOSIS — K8689 Other specified diseases of pancreas: Secondary | ICD-10-CM

## 2017-03-16 DIAGNOSIS — Z880 Allergy status to penicillin: Secondary | ICD-10-CM

## 2017-03-16 DIAGNOSIS — K862 Cyst of pancreas: Secondary | ICD-10-CM | POA: Diagnosis not present

## 2017-03-16 DIAGNOSIS — Z79899 Other long term (current) drug therapy: Secondary | ICD-10-CM | POA: Insufficient documentation

## 2017-03-16 DIAGNOSIS — Z7982 Long term (current) use of aspirin: Secondary | ICD-10-CM

## 2017-03-16 DIAGNOSIS — I10 Essential (primary) hypertension: Secondary | ICD-10-CM

## 2017-03-16 DIAGNOSIS — E785 Hyperlipidemia, unspecified: Secondary | ICD-10-CM | POA: Insufficient documentation

## 2017-03-16 LAB — CBC WITH DIFFERENTIAL/PLATELET
Basophils Absolute: 0.1 10*3/uL (ref 0–0.1)
Basophils Relative: 1 %
Eosinophils Absolute: 0.1 10*3/uL (ref 0–0.7)
Eosinophils Relative: 2 %
HCT: 41.5 % (ref 40.0–52.0)
Hemoglobin: 13.9 g/dL (ref 13.0–18.0)
LYMPHS ABS: 1.7 10*3/uL (ref 1.0–3.6)
LYMPHS PCT: 21 %
MCH: 31.8 pg (ref 26.0–34.0)
MCHC: 33.4 g/dL (ref 32.0–36.0)
MCV: 95 fL (ref 80.0–100.0)
MONO ABS: 1 10*3/uL (ref 0.2–1.0)
Monocytes Relative: 12 %
Neutro Abs: 5.5 10*3/uL (ref 1.4–6.5)
Neutrophils Relative %: 64 %
PLATELETS: 479 10*3/uL — AB (ref 150–440)
RBC: 4.37 MIL/uL — ABNORMAL LOW (ref 4.40–5.90)
RDW: 13 % (ref 11.5–14.5)
WBC: 8.4 10*3/uL (ref 3.8–10.6)

## 2017-03-16 NOTE — Progress Notes (Signed)
[facility] OFFICE PROGRESS NOTE  Patient Care Team: Crecencio Mc, MD as PCP - General (Internal Medicine) Bary Castilla, Forest Gleason, MD as Consulting Physician (General Surgery) Crecencio Mc, MD as Referring Physician (Internal Medicine) Leia Alf, MD (Inactive) as Medical Oncologist (Internal Medicine)   SUMMARY OF HEMATOLOGIC HISTORY:  # 2008- ESSENTIAL THROMBOCYTOSIS- Hydrea 563m/d; [Dr.Pandit] NOV 2008- BMBx- slight increase in enlarged megakayocytes; bcr-abl-Neg;Jak-2 NEG; MPL/CALR-NEG; March 2018- OFF Hydrea [? Sec to weight loss/fatigue]  # previous TIA   # March 2018- pancreatic cyst  [MRI]; recommend MRI in March 2019   INTERVAL HISTORY:  84year old male patient with above history of essential thrombocytosis currently on surveillance is here for follow-up. Patient's Hydrea has been held since march 2018- because of poor tolerance last weight loss  Patient's appetite is good; his weight is stable. Patient denies any recent blood clots or any strokes or TIAs. Denies any burning pain in his extremities. Otherwise no nausea vomiting.  REVIEW OF SYSTEMS:  A complete 10 point review of system is done which is negative except mentioned above/history of present illness.   PAST MEDICAL HISTORY :  Past Medical History:  Diagnosis Date  . Colon polyps   . [place]   . Essential thrombocytosis ([place]   . Hyperlipidemia   . Hypertension   . Lung nodule 12/2004   found on CXR  . Osteoporosis     PAST SURGICAL HISTORY :   Past Surgical History:  Procedure Laterality Date  . BONE MARROW BIOPSY  01/18/07  . COLONOSCOPY  2007, 2010   Dr WAllen Norris . NASAL SINUS SURGERY  06/11/05    FAMILY HISTORY :   Family History  Problem Relation Age of Onset  . Colon cancer Mother        colon age 82's . Peripheral vascular disease Father   . Colon cancer Brother        colon age 82's . Diabetes Unknown   . Prostate cancer Neg Hx   . Bladder  Cancer Neg Hx   . Kidney cancer Neg Hx     SOCIAL HISTORY:   Social History   Tobacco Use  . Smoking status: Never Smoker  . Smokeless tobacco: Never Used  Substance Use Topics  . Alcohol use: No  . Drug use: No    ALLERGIES:  is allergic to amlodipine; atenolol; erythromycin; furosemide; hctz [hydrochlorothiazide]; levofloxacin; metoprolol; micardis [telmisartan]; penicillins; terazosin; and triamterene-hctz.  MEDICATIONS:  Current Outpatient Medications  Medication Sig Dispense Refill  . amLODipine (NORVASC) 5 MG tablet TAKE 1 TABLET BY MOUTH ONCE DAILY (Patient taking differently: TAKE 1 TABLET BY MOUTH ONCE DAILY as needed based on blood pressure levels) 30 tablet 3  . aspirin EC 81 MG tablet Take 81 mg by mouth daily. Reported on 08/14/2015    . Calcium Citrate-Vitamin D (CITRACAL + D PO) Take 3 tablets by mouth daily. Take 2 by mouth daily, calcium 630 and vitamin D 500 each.    . Cholecalciferol (VITAMIN D3) 1000 UNITS CAPS Take 2,000 Units by mouth daily. Take 2 by mouth daily    . clotrimazole-betamethasone (LOTRISONE) cream Apply 1 application topically 2 (two) times daily. 30 g 0  . docusate sodium (COLACE) 100 MG capsule Take 100 mg by mouth 2 (two) times daily as needed for mild constipation.    . fluticasone (FLONASE) 50 MCG/ACT nasal spray Place 1 spray into both nostrils daily.     . hydrALAZINE (APRESOLINE) 50 MG  tablet TAKE 1 TABLET BY MOUTH 3 TIMES DAILY 90 tablet 1  . lisinopril (PRINIVIL,ZESTRIL) 40 MG tablet TAKE 1 TABLET BY MOUTH ONCE DAILY 30 tablet 3  . testosterone cypionate (DEPOTESTOSTERONE CYPIONATE) 200 MG/ML injection Inject 0.25 mLs (50 mg total) into the muscle every 14 (fourteen) days. 10 mL 0   Current Facility-Administered Medications  Medication Dose Route Frequency Provider Last Rate Last Dose  . testosterone cypionate (DEPOTESTOSTERONE CYPIONATE) injection 50 mg  50 mg Intramuscular Q14 Days Crecencio Mc, MD   50 mg at 03/03/17 1446  .  testosterone enanthate (DELATESTRYL) injection 50 mg  50 mg Intramuscular Q14 Days Crecencio Mc, MD   50 mg at 10/07/16 1001    PHYSICAL EXAMINATION:   BP (!) 184/66 (BP Location: Left Arm, Patient Position: Sitting)   Pulse 66   Temp 98.1 F (36.7 C) (Tympanic)   Resp 16   Wt 145 lb (65.8 kg)   BMI 23.40 kg/m   Filed Weights   03/16/17 1342  Weight: 145 lb (65.8 kg)    GENERAL: Well-nourished well-developed; Alert, no distress and comfortable. Accompanied by his wife. EYES: no pallor or icterus OROPHARYNX: no thrush or ulceration; good dentition  NECK: supple, no masses felt LYMPH:  no palpable lymphadenopathy in the cervical, axillary or inguinal regions LUNGS: clear to auscultation and  No wheeze or crackles HEART/CVS: regular rate & rhythm and no murmurs; No lower extremity edema ABDOMEN:abdomen soft, non-tender and normal bowel sounds Musculoskeletal:no cyanosis of digits and no clubbing  PSYCH: alert & oriented x 3 with fluent speech NEURO: no focal motor/sensory deficits SKIN:  no rashes or significant lesions  LABORATORY DATA:  I have reviewed the data as listed    Component Value Date/Time   NA 138 02/25/2017 0919   NA 136 05/19/2011 1049   K 4.7 02/25/2017 0919   K 4.3 05/19/2011 1049   CL 99 02/25/2017 0919   CL 99 05/19/2011 1049   CO2 35 (H) 02/25/2017 0919   CO2 32 05/19/2011 1049   GLUCOSE 97 02/25/2017 0919   GLUCOSE 116 (H) 05/19/2011 1049   BUN 19 02/25/2017 0919   BUN 8 05/19/2011 1049   CREATININE 1.08 02/25/2017 0919   CREATININE 1.15 12/24/2013 0921   CALCIUM 10.0 02/25/2017 0919   CALCIUM 9.0 05/19/2011 1049   PROT 6.9 02/25/2017 0919   PROT 6.8 12/24/2013 0921   ALBUMIN 4.4 02/25/2017 0919   ALBUMIN 4.0 12/24/2013 0921   AST 23 02/25/2017 0919   AST 23 12/24/2013 0921   ALT 20 02/25/2017 0919   ALT 19 12/24/2013 0921   ALKPHOS 62 02/25/2017 0919   ALKPHOS 57 12/24/2013 0921   BILITOT 1.2 02/25/2017 0919   BILITOT 0.9  12/24/2013 0921   GFRNONAA >60 09/15/2016 0840   GFRNONAA >60 12/24/2013 0921   GFRNONAA 50 (L) 09/03/2013 0930   GFRAA >60 09/15/2016 0840   GFRAA >60 12/24/2013 0921   GFRAA 58 (L) 09/03/2013 0930    No results found for: SPEP, UPEP  Lab Results  Component Value Date   WBC 8.4 03/16/2017   NEUTROABS 5.5 03/16/2017   HGB 13.9 03/16/2017   HCT 41.5 03/16/2017   MCV 95.0 03/16/2017   PLT 479 (H) 03/16/2017      Chemistry      Component Value Date/Time   NA 138 02/25/2017 0919   NA 136 05/19/2011 1049   K 4.7 02/25/2017 0919   K 4.3 05/19/2011 1049   CL 99 02/25/2017 0919  CL 99 05/19/2011 1049   CO2 35 (H) 02/25/2017 0919   CO2 32 05/19/2011 1049   BUN 19 02/25/2017 0919   BUN 8 05/19/2011 1049   CREATININE 1.08 02/25/2017 0919   CREATININE 1.15 12/24/2013 0921      Component Value Date/Time   CALCIUM 10.0 02/25/2017 0919   CALCIUM 9.0 05/19/2011 1049   ALKPHOS 62 02/25/2017 0919   ALKPHOS 57 12/24/2013 0921   AST 23 02/25/2017 0919   AST 23 12/24/2013 0921   ALT 20 02/25/2017 0919   ALT 19 12/24/2013 0921   BILITOT 1.2 02/25/2017 0919   BILITOT 0.9 12/24/2013 0921       RADIOGRAPHIC STUDIES: I have personally reviewed the radiological images as listed and agreed with the findings in the report. No results found.   ASSESSMENT & PLAN:   Essential thrombocytosis ([place]) # History of essential thrombocytosis- off Hydrea is March 2018. Today CBC is within normal except Platelets normal 479.   # Weight loss- unclear etiology -currently stable.   # Pancreatic cyst- recommend surveillance MRI in March 2019.   # Elevated Blood pressure- 782- systolic. Better at home as per pt. Defer to PCP.  # labs in  3 months;MD/MRI prior.   CC: Dr.Tullo.     Cammie Sickle, MD 03/29/2017 8:03 PM

## 2017-03-16 NOTE — Assessment & Plan Note (Signed)
#   History of essential thrombocytosis- off Hydrea is March 2018. Today CBC is within normal except Platelets normal 479.   # Weight loss- unclear etiology -currently stable.   # Pancreatic cyst- recommend surveillance MRI in March 2019.   # Elevated Blood pressure- 001- systolic. Better at home as per pt. Defer to PCP.  # labs in  3 months;MD/MRI prior.   CC: Dr.Tullo.

## 2017-03-17 ENCOUNTER — Ambulatory Visit (INDEPENDENT_AMBULATORY_CARE_PROVIDER_SITE_OTHER): Payer: PPO | Admitting: *Deleted

## 2017-03-17 DIAGNOSIS — E538 Deficiency of other specified B group vitamins: Secondary | ICD-10-CM | POA: Diagnosis not present

## 2017-03-17 MED ORDER — CYANOCOBALAMIN 1000 MCG/ML IJ SOLN
1000.0000 ug | Freq: Once | INTRAMUSCULAR | Status: AC
  Administered 2017-03-17: 1000 ug via INTRAMUSCULAR

## 2017-03-17 NOTE — Progress Notes (Signed)
Patient presented for B 12 injection to right deltoid, patient voiced no concerns nor showed any signs of distress during injection. 

## 2017-03-22 DIAGNOSIS — J320 Chronic maxillary sinusitis: Secondary | ICD-10-CM | POA: Diagnosis not present

## 2017-03-22 DIAGNOSIS — J300 Vasomotor rhinitis: Secondary | ICD-10-CM | POA: Diagnosis not present

## 2017-03-22 DIAGNOSIS — H6123 Impacted cerumen, bilateral: Secondary | ICD-10-CM | POA: Diagnosis not present

## 2017-03-31 ENCOUNTER — Ambulatory Visit (INDEPENDENT_AMBULATORY_CARE_PROVIDER_SITE_OTHER): Payer: PPO | Admitting: Family Medicine

## 2017-03-31 ENCOUNTER — Ambulatory Visit: Payer: PPO

## 2017-03-31 ENCOUNTER — Encounter: Payer: Self-pay | Admitting: Family Medicine

## 2017-03-31 VITALS — BP 144/86 | HR 71 | Temp 98.6°F | Wt 146.1 lb

## 2017-03-31 DIAGNOSIS — E349 Endocrine disorder, unspecified: Secondary | ICD-10-CM

## 2017-03-31 DIAGNOSIS — E871 Hypo-osmolality and hyponatremia: Secondary | ICD-10-CM | POA: Diagnosis not present

## 2017-03-31 DIAGNOSIS — K5900 Constipation, unspecified: Secondary | ICD-10-CM

## 2017-03-31 DIAGNOSIS — R109 Unspecified abdominal pain: Secondary | ICD-10-CM | POA: Diagnosis not present

## 2017-03-31 DIAGNOSIS — R7989 Other specified abnormal findings of blood chemistry: Secondary | ICD-10-CM | POA: Diagnosis not present

## 2017-03-31 MED ORDER — TESTOSTERONE CYPIONATE 200 MG/ML IM SOLN
50.0000 mg | Freq: Once | INTRAMUSCULAR | Status: AC
  Administered 2017-03-31: 50 mg via INTRAMUSCULAR

## 2017-03-31 NOTE — Patient Instructions (Addendum)
Please add miralax once daily with colace for constipation. You symptoms today are most likely associated with constipation and can be improved with daily miralax and increasing your water intake.  Follow up with your provider if symptoms do not improve with treatment, worsen, or you develop new symptoms or fever.   Constipation, Adult Constipation is when a person:  Poops (has a bowel movement) fewer times in a week than normal.  Has a hard time pooping.  Has poop that is dry, hard, or bigger than normal.  Follow these instructions at home: Eating and drinking   Eat foods that have a lot of fiber, such as: ? Fresh fruits and vegetables. ? Whole grains. ? Beans.  Eat less of foods that are high in fat, low in fiber, or overly processed, such as: ? [place] fries. ? Hamburgers. ? Cookies. ? Candy. ? Soda.  Drink enough fluid to keep your pee (urine) clear or pale yellow. General instructions  Exercise regularly or as told by your doctor.  Go to the restroom when you feel like you need to poop. Do not hold it in.  Take over-the-counter and prescription medicines only as told by your doctor. These include any fiber supplements.  Do pelvic floor retraining exercises, such as: ? Doing deep breathing while relaxing your lower belly (abdomen). ? Relaxing your pelvic floor while pooping.  Watch your condition for any changes.  Keep all follow-up visits as told by your doctor. This is important. Contact a doctor if:  You have pain that gets worse.  You have a fever.  You have not pooped for 4 days.  You throw up (vomit).  You are not hungry.  You lose weight.  You are bleeding from the anus.  You have thin, pencil-like poop (stool). Get help right away if:  You have a fever, and your symptoms suddenly get worse.  You leak poop or have blood in your poop.  Your belly feels hard or bigger than normal (is bloated).  You have very bad belly pain.  You feel  dizzy or you faint. This information is not intended to replace advice given to you by your health care provider. Make sure you discuss any questions you have with your health care provider. Document Released: 08/04/2007 Document Revised: 09/05/2015 Document Reviewed: 08/06/2015 Elsevier Interactive Patient Education  2018 [employer].

## 2017-03-31 NOTE — Addendum Note (Signed)
Addended by: Nanci Pina on: 03/31/2017 03:24 PM   Modules accepted: Orders

## 2017-03-31 NOTE — Progress Notes (Addendum)
Subjective:    Patient ID: Jimmy Jimenez, male    DOB: 03/13/1925, 84 y.o.   MRN: [id number]  HPI  Jimmy Jimenez is a 84 year old male who presents today with abdominal pain that occurs approximately once a week and lasts 10 to 15 seconds in duration.   Abdominal Pain:  Location:lower abdomen Onset/Timing: started one month ago,  Duration: 10 to 15 seconds Severity/Quality: sharp, cramping and noted as a 9 that resolves spontaneously Worse/Better by: nothing makes it better or worse Associated Symptoms: constipation Denies loss of appetite, vomiting, diarrhea, rectal bleeding, fever, weight loss Reports drinking "very little water"  Last BM 3 days ago  He  has a past medical history of Colon polyps, Essential thrombocytosis ([place]), Essential thrombocytosis ([place]), Hyperlipidemia, Hypertension, Lung nodule (12/2004), and Osteoporosis.  History of fecal impaction 06/2016 and 08/2016 where he was evaluated and treated at the ED.  Treatments at home: Stool softener once daily and occasional use of miralax no more than one dose/week    Review of Systems  Constitutional: Negative for chills, fatigue and fever.  Respiratory: Negative for cough and shortness of breath.   Cardiovascular: Negative for chest pain and palpitations.  Gastrointestinal: Positive for abdominal pain and constipation. Negative for abdominal distention, blood in stool, diarrhea, nausea and vomiting.  Genitourinary: Negative for dysuria and hematuria.  Skin: Negative for rash.  Neurological: Negative for dizziness, weakness, light-headedness and headaches.   Past Medical History:  Diagnosis Date  . Colon polyps   . Essential thrombocytosis ([place])   . Essential thrombocytosis ([place])   . Hyperlipidemia   . Hypertension   . Lung nodule 12/2004   found on CXR  . Osteoporosis      Social History   Socioeconomic History  . Marital status: Married    Spouse name: Not on file  . Number of children: Not on file  .  Years of education: Not on file  . Highest education level: Not on file  Social Needs  . Financial resource strain: Not on file  . Food insecurity - worry: Not on file  . Food insecurity - inability: Not on file  . Transportation needs - medical: Not on file  . Transportation needs - non-medical: Not on file  Occupational History  . Occupation: Retired    Comment: former Nature conservation officer, [employer]  Tobacco Use  . Smoking status: Never Smoker  . Smokeless tobacco: Never Used  Substance and Sexual Activity  . Alcohol use: No  . Drug use: No  . Sexual activity: Not Currently  Other Topics Concern  . Not on file  Social History Narrative  . Not on file    Past Surgical History:  Procedure Laterality Date  . BONE MARROW BIOPSY  01/18/07  . COLONOSCOPY  2007, 2010   Dr Allen Norris  . NASAL SINUS SURGERY  06/11/05    Family History  Problem Relation Age of Onset  . Colon cancer Mother        colon age 44's  . Peripheral vascular disease Father   . Colon cancer Brother        colon age 73's  . Diabetes Unknown   . Prostate cancer Neg Hx   . Bladder Cancer Neg Hx   . Kidney cancer Neg Hx     Allergies  Allergen Reactions  . Amlodipine     swelling  . Atenolol     swelling  . Erythromycin     Other reaction(s): Hallucination  . Furosemide  Dizziness   . Hctz [Hydrochlorothiazide]     hyponatremia  . Levofloxacin     Causes sleepiness  . Metoprolol     dizziness  . Micardis [Telmisartan]     Causes swelling  . Penicillins Itching    Has patient had a PCN reaction causing immediate rash, facial/tongue/throat swelling, SOB or lightheadedness with hypotension: No Has patient had a PCN reaction causing severe rash involving mucus membranes or skin necrosis: No Has patient had a PCN reaction that required hospitalization No Has patient had a PCN reaction occurring within the last 10 years: No If all of the above answers are "NO", then may proceed with Cephalosporin use.  .  Terazosin     Causes swelling in legs and feet  . Triamterene-Hctz     Patient stated is causes low sodium    Current Outpatient Medications on File Prior to Visit  Medication Sig Dispense Refill  . amLODipine (NORVASC) 5 MG tablet TAKE 1 TABLET BY MOUTH ONCE DAILY (Patient taking differently: TAKE 1 TABLET BY MOUTH ONCE DAILY as needed based on blood pressure levels) 30 tablet 3  . aspirin EC 81 MG tablet Take 81 mg by mouth daily. Reported on 08/14/2015    . Calcium Citrate-Vitamin D (CITRACAL + D PO) Take 3 tablets by mouth daily. Take 2 by mouth daily, calcium 630 and vitamin D 500 each.    . Cholecalciferol (VITAMIN D3) 1000 UNITS CAPS Take 2,000 Units by mouth daily. Take 2 by mouth daily    . clotrimazole-betamethasone (LOTRISONE) cream Apply 1 application topically 2 (two) times daily. 30 g 0  . docusate sodium (COLACE) 100 MG capsule Take 100 mg by mouth 2 (two) times daily as needed for mild constipation.    . fluticasone (FLONASE) 50 MCG/ACT nasal spray Place 1 spray into both nostrils daily.     . hydrALAZINE (APRESOLINE) 50 MG tablet TAKE 1 TABLET BY MOUTH 3 TIMES DAILY 90 tablet 1  . lisinopril (PRINIVIL,ZESTRIL) 40 MG tablet TAKE 1 TABLET BY MOUTH ONCE DAILY 30 tablet 3  . testosterone cypionate (DEPOTESTOSTERONE CYPIONATE) 200 MG/ML injection Inject 0.25 mLs (50 mg total) into the muscle every 14 (fourteen) days. 10 mL 0   Current Facility-Administered Medications on File Prior to Visit  Medication Dose Route Frequency Provider Last Rate Last Dose  . testosterone cypionate (DEPOTESTOSTERONE CYPIONATE) injection 50 mg  50 mg Intramuscular Q14 Days Crecencio Mc, MD   50 mg at 03/03/17 1446  . testosterone enanthate (DELATESTRYL) injection 50 mg  50 mg Intramuscular Q14 Days Crecencio Mc, MD   50 mg at 10/07/16 1001    BP (!) 144/86   Pulse 71   Temp 98.6 F (37 C) (Oral)   Wt 146 lb 1.9 oz (66.3 kg)   SpO2 94%   BMI 23.58 kg/m       Objective:   Physical Exam    Constitutional: He is oriented to person, place, and time.  Thin, optimally nourished male  Eyes: Pupils are equal, round, and reactive to light. No scleral icterus.  Neck: Neck supple.  Cardiovascular: Normal rate and regular rhythm.  Pulmonary/Chest: Effort normal and breath sounds normal. He has no wheezes. He has no rales.  Abdominal: Soft. Bowel sounds are normal. He exhibits no distension. There is no tenderness. There is no rigidity, no rebound, no guarding, no CVA tenderness and no tenderness at McBurney's point.  Lymphadenopathy:    He has no cervical adenopathy.  Neurological: He is alert  and oriented to person, place, and time.  Skin: Skin is warm and dry. No rash noted.  Psychiatric: He has a normal mood and affect. His behavior is normal. Judgment and thought content normal.       Assessment & Plan:  1. Abdominal pain, unspecified abdominal location Improved, not present during exam or today per patient; Exam is reassuring; pain occurs intermittently and resolves spontaneously;  Last BM 3 days ago. Long history of severe constipation. Exam is reassuring today; pain that occurs intermittently with short duration and decreased use of miralax is most likely due to constipation. Patient and wife both stated that he did not use miralax on a regular basis. We reviewed that PCP recommended daily use of Miralax with stool softener however patient has not maintained this regimen. Low suspicion for colitis or need for imaging today.  2. Constipation, unspecified constipation type BM occurs once weekly; daily use of colace and rare or once weekly use of miralax noted. Advised daily miralax with colace and increase water intake with dietary approaches to improve frequency of BMs.  I spent 25 minutes with this patient and greater than 50% of the time was spent in face to face counseling regarding dietary choices, increased fiber intake, use of Miralax and colace to improve symptoms of  constipation. Further review of symptoms that require further evaluation including fever, vomiting, stools thin like a pencil, worsening symptoms, or blood in stool were discussed.  Discussed with wife and patient the importance of follow up if symptoms do not improve with treatment, worsen, or he develops new symptoms or fever. Further discussed option of imaging if symptoms worsen or do not improve. He and wife verbalized understanding and agreed with plan.  Delano Metz, FNP-C.

## 2017-04-01 ENCOUNTER — Encounter: Payer: Self-pay | Admitting: Urology

## 2017-04-01 ENCOUNTER — Ambulatory Visit (INDEPENDENT_AMBULATORY_CARE_PROVIDER_SITE_OTHER): Payer: PPO | Admitting: Urology

## 2017-04-01 VITALS — BP 155/74 | HR 80 | Ht 66.0 in | Wt 146.9 lb

## 2017-04-01 DIAGNOSIS — N481 Balanitis: Secondary | ICD-10-CM

## 2017-04-01 DIAGNOSIS — N5089 Other specified disorders of the male genital organs: Secondary | ICD-10-CM

## 2017-04-01 MED ORDER — CLOTRIMAZOLE-BETAMETHASONE 1-0.05 % EX CREA: 1.0000 "application " | TOPICAL_CREAM | Freq: Two times a day (BID) | CUTANEOUS | 3 refills | Status: DC

## 2017-04-01 NOTE — Progress Notes (Signed)
04/01/2017 1:49 PM   Jimmy Jimenez 03/13/1925 [id number]  Referring provider: Crecencio Mc, MD [address] [place], [place] [address]  Chief Complaint  Patient presents with  . Other    Penis irritation    HPI: The patient is a 84 year old gentleman who presents today for follow-up of balanitis which she was seen for approximately 3 weeks ago.He is uncircumcised.  He also had a paraphimosis at this time.  He was started on Lotrisone cream.  This has since improved.  He is very happy with the results.  However he has developed scrotal irritation of his skin.  It itches.  He is very bothered by this.  This started approximately 1 week ago.  He ran out of the Lotrisone cream a few weeks ago.   PMH: Past Medical History:  Diagnosis Date  . Colon polyps   . Essential thrombocytosis ([place])   . Essential thrombocytosis ([place])   . Hyperlipidemia   . Hypertension   . Lung nodule 12/2004   found on CXR  . Osteoporosis     Surgical History: Past Surgical History:  Procedure Laterality Date  . BONE MARROW BIOPSY  01/18/07  . COLONOSCOPY  2007, 2010   Dr Allen Norris  . NASAL SINUS SURGERY  06/11/05    Home Medications:  Allergies as of 04/01/2017      Reactions   Amlodipine    swelling   Atenolol    swelling   Erythromycin    Other reaction(s): Hallucination   Furosemide    Dizziness   Hctz [hydrochlorothiazide]    hyponatremia   Levofloxacin    Causes sleepiness   Metoprolol    dizziness   Micardis [telmisartan]    Causes swelling   Penicillins Itching   Has patient had a PCN reaction causing immediate rash, facial/tongue/throat swelling, SOB or lightheadedness with hypotension: No Has patient had a PCN reaction causing severe rash involving mucus membranes or skin necrosis: No Has patient had a PCN reaction that required hospitalization No Has patient had a PCN reaction occurring within the last 10 years: No If all of the above answers are "NO", then may  proceed with Cephalosporin use.   Terazosin    Causes swelling in legs and feet   Triamterene-hctz    Patient stated is causes low sodium      Medication List        Accurate as of 04/01/17  1:49 PM. Always use your most recent med list.          amLODipine 5 MG tablet Commonly known as:  NORVASC TAKE 1 TABLET BY MOUTH ONCE DAILY   aspirin EC 81 MG tablet Take 81 mg by mouth daily. Reported on 08/14/2015   B-12 COMPLIANCE INJECTION 1000 MCG/ML Kit Generic drug:  Cyanocobalamin Inject as directed.   CITRACAL + D PO Take 3 tablets by mouth daily. Take 2 by mouth daily, calcium 630 and vitamin D 500 each.   clotrimazole-betamethasone cream Commonly known as:  LOTRISONE Apply 1 application topically 2 (two) times daily.   clotrimazole-betamethasone cream Commonly known as:  LOTRISONE Apply 1 application topically 2 (two) times daily. Apply to affected area BID   docusate sodium 100 MG capsule Commonly known as:  COLACE Take 100 mg by mouth 2 (two) times daily as needed for mild constipation.   fluticasone 50 MCG/ACT nasal spray Commonly known as:  FLONASE Place 1 spray into both nostrils daily.   hydrALAZINE 50 MG tablet Commonly known as:  APRESOLINE TAKE 1 TABLET BY MOUTH 3 TIMES DAILY   lisinopril 40 MG tablet Commonly known as:  PRINIVIL,ZESTRIL TAKE 1 TABLET BY MOUTH ONCE DAILY   testosterone cypionate 200 MG/ML injection Commonly known as:  DEPOTESTOSTERONE CYPIONATE Inject 0.25 mLs (50 mg total) into the muscle every 14 (fourteen) days.   Vitamin D3 1000 units Caps Take 2,000 Units by mouth daily. Take 2 by mouth daily       Allergies:  Allergies  Allergen Reactions  . Amlodipine     swelling  . Atenolol     swelling  . Erythromycin     Other reaction(s): Hallucination  . Furosemide     Dizziness   . Hctz [Hydrochlorothiazide]     hyponatremia  . Levofloxacin     Causes sleepiness  . Metoprolol     dizziness  . Micardis [Telmisartan]       Causes swelling  . Penicillins Itching    Has patient had a PCN reaction causing immediate rash, facial/tongue/throat swelling, SOB or lightheadedness with hypotension: No Has patient had a PCN reaction causing severe rash involving mucus membranes or skin necrosis: No Has patient had a PCN reaction that required hospitalization No Has patient had a PCN reaction occurring within the last 10 years: No If all of the above answers are "NO", then may proceed with Cephalosporin use.  . Terazosin     Causes swelling in legs and feet  . Triamterene-Hctz     Patient stated is causes low sodium    Family History: Family History  Problem Relation Age of Onset  . Colon cancer Mother        colon age 62's  . Peripheral vascular disease Father   . Colon cancer Brother        colon age 71's  . Diabetes Unknown   . Prostate cancer Neg Hx   . Bladder Cancer Neg Hx   . Kidney cancer Neg Hx     Social History:  reports that  has never smoked. he has never used smokeless tobacco. He reports that he does not drink alcohol or use drugs.  ROS: UROLOGY Frequent Urination?: No Hard to postpone urination?: No Burning/pain with urination?: No Get up at night to urinate?: No Leakage of urine?: No Urine stream starts and stops?: No Trouble starting stream?: No Do you have to strain to urinate?: No Blood in urine?: No Urinary tract infection?: No Sexually transmitted disease?: No Injury to kidneys or bladder?: No Painful intercourse?: No Weak stream?: No Erection problems?: No Penile pain?: No  Gastrointestinal Nausea?: No Vomiting?: No Indigestion/heartburn?: No Diarrhea?: No Constipation?: No  Constitutional Fever: No Night sweats?: No Weight loss?: No Fatigue?: No  Skin Skin rash/lesions?: No Itching?: No  Eyes Blurred vision?: No Double vision?: No  Ears/Nose/Throat Sore throat?: No Sinus problems?: No  Hematologic/Lymphatic Swollen glands?: No Easy bruising?:  No  Cardiovascular Leg swelling?: No Chest pain?: No  Respiratory Cough?: No Shortness of breath?: No  Endocrine Excessive thirst?: No  Musculoskeletal Back pain?: No Joint pain?: No  Neurological Headaches?: No Dizziness?: No  Psychologic Depression?: No Anxiety?: No  Physical Exam: BP (!) 155/74 (BP Location: Right Arm, Patient Position: Sitting, Cuff Size: Normal)   Pulse 80   Ht 5' 6"  (1.676 m)   Wt 146 lb 14.4 oz (66.6 kg)   BMI 23.71 kg/m   Constitutional:  Alert and oriented, No acute distress. HEENT: [place] AT, moist mucus membranes.  Trachea midline, no masses. Cardiovascular: No clubbing,  cyanosis, or edema. Respiratory: Normal respiratory effort, no increased work of breathing. GI: Abdomen is soft, nontender, nondistended, no abdominal masses GU: No CVA tenderness.  Foreskin reduced.  No irritation of the glans.  Balanitis resolved.  The patient does have irritation of his scrotal skin.  It does not look infectious in etiology.  No abscess.  No drainage. Skin: No rashes, bruises or suspicious lesions. Lymph: No cervical or inguinal adenopathy. Neurologic: Grossly intact, no focal deficits, moving all 4 extremities. Psychiatric: Normal mood and affect.  Laboratory Data: Lab Results  Component Value Date   WBC 8.4 03/16/2017   HGB 13.9 03/16/2017   HCT 41.5 03/16/2017   MCV 95.0 03/16/2017   PLT 479 (H) 03/16/2017    Lab Results  Component Value Date   CREATININE 1.08 02/25/2017    Lab Results  Component Value Date   PSA 0.88 08/19/2014    Lab Results  Component Value Date   TESTOSTERONE 111 (L) 05/19/2015    No results found for: HGBA1C  Urinalysis    Component Value Date/Time   COLORURINE YELLOW 08/29/2014 1050   APPEARANCEUR Clear 03/10/2017 1324   LABSPEC 1.015 08/29/2014 1050   PHURINE 6.0 08/29/2014 1050   GLUCOSEU Negative 03/10/2017 1324   GLUCOSEU NEGATIVE 08/29/2014 1050   HGBUR NEGATIVE 08/29/2014 1050   BILIRUBINUR  Negative 03/10/2017 1324   KETONESUR NEGATIVE 08/29/2014 1050   PROTEINUR Trace (A) 03/10/2017 1324   UROBILINOGEN 0.2 08/29/2014 1050   NITRITE Negative 03/10/2017 1324   NITRITE NEGATIVE 08/29/2014 1050   LEUKOCYTESUR Negative 03/10/2017 1324    Assessment & Plan:    1.  Balanitis Resolved  2.  Scrotal skin irritation I have prescribed the patient a topical steroid with Lotrisone.  I have informed him if this does not improve he needs to see his dermatologist.  Return if symptoms worsen or fail to improve.  Nickie Retort, MD  [facility] [address], [place], [place] [address] [phone]

## 2017-04-14 ENCOUNTER — Ambulatory Visit (INDEPENDENT_AMBULATORY_CARE_PROVIDER_SITE_OTHER): Payer: PPO | Admitting: *Deleted

## 2017-04-14 DIAGNOSIS — R7989 Other specified abnormal findings of blood chemistry: Secondary | ICD-10-CM

## 2017-04-14 MED ORDER — TESTOSTERONE CYPIONATE 200 MG/ML IM SOLN
50.0000 mg | Freq: Once | INTRAMUSCULAR | Status: AC
  Administered 2017-04-14: 50 mg via INTRAMUSCULAR

## 2017-04-14 NOTE — Progress Notes (Signed)
Patient in office for testosterone injection given in LUQ, patient tolerated well.

## 2017-04-17 NOTE — Progress Notes (Signed)
  I have reviewed the above information and agree with above.   Betheny Suchecki, MD 

## 2017-04-20 ENCOUNTER — Other Ambulatory Visit: Payer: Self-pay

## 2017-04-28 ENCOUNTER — Ambulatory Visit (INDEPENDENT_AMBULATORY_CARE_PROVIDER_SITE_OTHER): Payer: PPO

## 2017-04-28 DIAGNOSIS — E538 Deficiency of other specified B group vitamins: Secondary | ICD-10-CM

## 2017-04-28 DIAGNOSIS — E291 Testicular hypofunction: Secondary | ICD-10-CM

## 2017-04-28 MED ORDER — TESTOSTERONE CYPIONATE 200 MG/ML IM SOLN
50.0000 mg | Freq: Once | INTRAMUSCULAR | Status: AC
  Administered 2017-04-28: 50 mg via INTRAMUSCULAR

## 2017-04-28 MED ORDER — CYANOCOBALAMIN 1000 MCG/ML IJ SOLN
1000.0000 ug | Freq: Once | INTRAMUSCULAR | Status: AC
  Administered 2017-04-28: 1000 ug via INTRAMUSCULAR

## 2017-04-28 NOTE — Progress Notes (Addendum)
Pt presented today for testosterone injection. Right upper outer quadrant. Pt voiced no concern nor showed any distress during injection. Pt also received B12 injection. Left deltoid. Pt voiced no concerns nor showed any signs of distress during the injection.   Reviewed.  Dr Nicki Reaper

## 2017-05-03 DIAGNOSIS — J069 Acute upper respiratory infection, unspecified: Secondary | ICD-10-CM | POA: Diagnosis not present

## 2017-05-05 ENCOUNTER — Other Ambulatory Visit: Payer: Self-pay | Admitting: Internal Medicine

## 2017-05-12 ENCOUNTER — Other Ambulatory Visit: Payer: Self-pay | Admitting: Internal Medicine

## 2017-05-12 ENCOUNTER — Ambulatory Visit (INDEPENDENT_AMBULATORY_CARE_PROVIDER_SITE_OTHER): Payer: PPO | Admitting: *Deleted

## 2017-05-12 DIAGNOSIS — R7989 Other specified abnormal findings of blood chemistry: Secondary | ICD-10-CM | POA: Diagnosis not present

## 2017-05-12 MED ORDER — TESTOSTERONE CYPIONATE 200 MG/ML IM SOLN
50.0000 mg | Freq: Once | INTRAMUSCULAR | Status: AC
  Administered 2017-05-12: 50 mg via INTRAMUSCULAR

## 2017-05-12 NOTE — Progress Notes (Signed)
Patient presented for testosterone injection to LUQ patient tolerated injection well.

## 2017-05-13 NOTE — Progress Notes (Signed)
  I have reviewed the above information and agree with above.   Berwyn Bigley, MD 

## 2017-05-17 ENCOUNTER — Other Ambulatory Visit: Payer: Self-pay | Admitting: Internal Medicine

## 2017-05-24 ENCOUNTER — Other Ambulatory Visit: Payer: Self-pay

## 2017-05-24 MED ORDER — CLOTRIMAZOLE-BETAMETHASONE 1-0.05 % EX CREA: 1.0000 "application " | TOPICAL_CREAM | Freq: Two times a day (BID) | CUTANEOUS | 3 refills | Status: DC

## 2017-05-26 ENCOUNTER — Ambulatory Visit (INDEPENDENT_AMBULATORY_CARE_PROVIDER_SITE_OTHER): Payer: PPO | Admitting: *Deleted

## 2017-05-26 DIAGNOSIS — R7989 Other specified abnormal findings of blood chemistry: Secondary | ICD-10-CM

## 2017-05-26 DIAGNOSIS — E538 Deficiency of other specified B group vitamins: Secondary | ICD-10-CM | POA: Diagnosis not present

## 2017-05-26 DIAGNOSIS — E349 Endocrine disorder, unspecified: Secondary | ICD-10-CM | POA: Diagnosis not present

## 2017-05-26 MED ORDER — TESTOSTERONE CYPIONATE 200 MG/ML IM SOLN: 50.0000 mg | Freq: Once | INTRAMUSCULAR | Status: DC

## 2017-05-26 MED ORDER — CYANOCOBALAMIN 1000 MCG/ML IJ SOLN
1000.0000 ug | Freq: Once | INTRAMUSCULAR | Status: AC
Start: 2017-05-26 — End: 2017-05-26
  Administered 2017-05-26: 1000 ug via INTRAMUSCULAR

## 2017-05-26 NOTE — Progress Notes (Signed)
Patient presented for B 12 injection to right deltoid, patient voiced no concerns nor showed any signs of distress during injection.  Testosterone given RUQ,patient tolerated well.

## 2017-06-09 ENCOUNTER — Ambulatory Visit (INDEPENDENT_AMBULATORY_CARE_PROVIDER_SITE_OTHER): Payer: PPO | Admitting: *Deleted

## 2017-06-09 DIAGNOSIS — R7989 Other specified abnormal findings of blood chemistry: Secondary | ICD-10-CM | POA: Diagnosis not present

## 2017-06-09 MED ORDER — TESTOSTERONE CYPIONATE 200 MG/ML IM SOLN
50.0000 mg | INTRAMUSCULAR | Status: DC
  Administered 2017-06-09 – 2017-07-07 (×2): 50 mg via INTRAMUSCULAR

## 2017-06-09 NOTE — Progress Notes (Addendum)
Patient in office for testosterone injection given in Right arm, patient tolerated well.

## 2017-06-13 ENCOUNTER — Inpatient Hospital Stay: Payer: PPO | Attending: Internal Medicine

## 2017-06-13 DIAGNOSIS — Z7982 Long term (current) use of aspirin: Secondary | ICD-10-CM | POA: Diagnosis not present

## 2017-06-13 DIAGNOSIS — R5383 Other fatigue: Secondary | ICD-10-CM | POA: Insufficient documentation

## 2017-06-13 DIAGNOSIS — Z800 Family history of malignant neoplasm of digestive organs: Secondary | ICD-10-CM | POA: Diagnosis not present

## 2017-06-13 DIAGNOSIS — I10 Essential (primary) hypertension: Secondary | ICD-10-CM | POA: Insufficient documentation

## 2017-06-13 DIAGNOSIS — K862 Cyst of pancreas: Secondary | ICD-10-CM | POA: Insufficient documentation

## 2017-06-13 DIAGNOSIS — R634 Abnormal weight loss: Secondary | ICD-10-CM | POA: Insufficient documentation

## 2017-06-13 DIAGNOSIS — D473 Essential (hemorrhagic) thrombocythemia: Secondary | ICD-10-CM | POA: Diagnosis not present

## 2017-06-13 DIAGNOSIS — Z79899 Other long term (current) drug therapy: Secondary | ICD-10-CM | POA: Diagnosis not present

## 2017-06-13 DIAGNOSIS — E785 Hyperlipidemia, unspecified: Secondary | ICD-10-CM | POA: Diagnosis not present

## 2017-06-13 DIAGNOSIS — M810 Age-related osteoporosis without current pathological fracture: Secondary | ICD-10-CM | POA: Diagnosis not present

## 2017-06-13 DIAGNOSIS — K8689 Other specified diseases of pancreas: Secondary | ICD-10-CM

## 2017-06-13 LAB — COMPREHENSIVE METABOLIC PANEL
ALT: 18 U/L (ref 17–63)
ANION GAP: 9 (ref 5–15)
AST: 21 U/L (ref 15–41)
Albumin: 4.3 g/dL (ref 3.5–5.0)
Alkaline Phosphatase: 68 U/L (ref 38–126)
BUN: 20 mg/dL (ref 6–20)
CHLORIDE: 94 mmol/L — AB (ref 101–111)
CO2: 30 mmol/L (ref 22–32)
Calcium: 9.8 mg/dL (ref 8.9–10.3)
Creatinine, Ser: 1.09 mg/dL (ref 0.61–1.24)
GFR, EST NON AFRICAN AMERICAN: 58 mL/min — AB (ref >60)
Glucose, Bld: 112 mg/dL — ABNORMAL HIGH (ref 65–99)
POTASSIUM: 4 mmol/L (ref 3.5–5.1)
Sodium: 133 mmol/L — ABNORMAL LOW (ref 135–145)
Total Bilirubin: 1.5 mg/dL — ABNORMAL HIGH (ref 0.3–1.2)
Total Protein: 7.1 g/dL (ref 6.5–8.1)

## 2017-06-13 LAB — CBC WITH DIFFERENTIAL/PLATELET
Basophils Absolute: 0.1 10*3/uL (ref 0–0.1)
Basophils Relative: 1 %
Eosinophils Absolute: 0.2 10*3/uL (ref 0–0.7)
Eosinophils Relative: 2 %
HCT: 47.1 % (ref 40.0–52.0)
HEMOGLOBIN: 16.2 g/dL (ref 13.0–18.0)
LYMPHS ABS: 1.5 10*3/uL (ref 1.0–3.6)
LYMPHS PCT: 14 %
MCH: 32.1 pg (ref 26.0–34.0)
MCHC: 34.3 g/dL (ref 32.0–36.0)
MCV: 93.5 fL (ref 80.0–100.0)
Monocytes Absolute: 1 10*3/uL (ref 0.2–1.0)
Monocytes Relative: 9 %
NEUTROS PCT: 74 %
Neutro Abs: 7.9 10*3/uL — ABNORMAL HIGH (ref 1.4–6.5)
Platelets: 505 10*3/uL — ABNORMAL HIGH (ref 150–440)
RBC: 5.04 MIL/uL (ref 4.40–5.90)
RDW: 13.1 % (ref 11.5–14.5)
WBC: 10.7 10*3/uL — AB (ref 3.8–10.6)

## 2017-06-14 ENCOUNTER — Ambulatory Visit
Admission: RE | Admit: 2017-06-14 | Discharge: 2017-06-14 | Disposition: A | Discharge status: Home / Self Care | Payer: PPO | Source: Ambulatory Visit | Attending: Internal Medicine | Admitting: Internal Medicine

## 2017-06-14 DIAGNOSIS — K862 Cyst of pancreas: Secondary | ICD-10-CM | POA: Diagnosis present

## 2017-06-14 DIAGNOSIS — I700 Atherosclerosis of aorta: Secondary | ICD-10-CM | POA: Insufficient documentation

## 2017-06-14 DIAGNOSIS — K8689 Other specified diseases of pancreas: Secondary | ICD-10-CM

## 2017-06-14 LAB — POCT I-STAT CREATININE: CREATININE: 1.1 mg/dL (ref 0.61–1.24)

## 2017-06-14 LAB — CANCER ANTIGEN 19-9: CAN 19-9: 41 U/mL — AB (ref 0–35)

## 2017-06-14 MED ORDER — GADOBENATE DIMEGLUMINE 529 MG/ML IV SOLN
13.0000 mL | Freq: Once | INTRAVENOUS | Status: AC | PRN
  Administered 2017-06-14: 13 mL via INTRAVENOUS

## 2017-06-15 ENCOUNTER — Other Ambulatory Visit: Payer: Self-pay | Admitting: Internal Medicine

## 2017-06-16 ENCOUNTER — Encounter: Payer: Self-pay | Admitting: Internal Medicine

## 2017-06-16 ENCOUNTER — Inpatient Hospital Stay (HOSPITAL_BASED_OUTPATIENT_CLINIC_OR_DEPARTMENT_OTHER): Payer: PPO | Admitting: Internal Medicine

## 2017-06-16 ENCOUNTER — Ambulatory Visit: Payer: PPO | Admitting: Internal Medicine

## 2017-06-16 ENCOUNTER — Other Ambulatory Visit: Payer: Self-pay

## 2017-06-16 VITALS — BP 176/62 | HR 73 | Temp 97.9°F | Resp 20

## 2017-06-16 DIAGNOSIS — D473 Essential (hemorrhagic) thrombocythemia: Secondary | ICD-10-CM

## 2017-06-16 DIAGNOSIS — Z79899 Other long term (current) drug therapy: Secondary | ICD-10-CM

## 2017-06-16 DIAGNOSIS — Z7982 Long term (current) use of aspirin: Secondary | ICD-10-CM | POA: Diagnosis not present

## 2017-06-16 DIAGNOSIS — K862 Cyst of pancreas: Secondary | ICD-10-CM | POA: Diagnosis not present

## 2017-06-16 DIAGNOSIS — I10 Essential (primary) hypertension: Secondary | ICD-10-CM | POA: Diagnosis not present

## 2017-06-16 DIAGNOSIS — R634 Abnormal weight loss: Secondary | ICD-10-CM

## 2017-06-16 DIAGNOSIS — M810 Age-related osteoporosis without current pathological fracture: Secondary | ICD-10-CM

## 2017-06-16 DIAGNOSIS — K8689 Other specified diseases of pancreas: Secondary | ICD-10-CM

## 2017-06-16 DIAGNOSIS — E785 Hyperlipidemia, unspecified: Secondary | ICD-10-CM | POA: Diagnosis not present

## 2017-06-16 DIAGNOSIS — Z800 Family history of malignant neoplasm of digestive organs: Secondary | ICD-10-CM | POA: Diagnosis not present

## 2017-06-16 DIAGNOSIS — R5383 Other fatigue: Secondary | ICD-10-CM | POA: Diagnosis not present

## 2017-06-16 MED ORDER — HYDROXYUREA 500 MG PO CAPS: ORAL_CAPSULE | ORAL | 3 refills | Status: DC

## 2017-06-16 NOTE — Progress Notes (Signed)
[facility] OFFICE PROGRESS NOTE  Patient Care Team: Crecencio Mc, MD as PCP - General (Internal Medicine) Bary Castilla, Forest Gleason, MD as Consulting Physician (General Surgery) Crecencio Mc, MD as Referring Physician (Internal Medicine) Leia Alf, MD (Inactive) as Medical Oncologist (Internal Medicine)   SUMMARY OF HEMATOLOGIC HISTORY:  # 2008- ESSENTIAL THROMBOCYTOSIS- Hydrea 526m/d; [Dr.Pandit] NOV 2008- BMBx- slight increase in enlarged megakayocytes; bcr-abl-Neg;Jak-2 NEG; MPL/CALR-NEG; March 2018- OFF Hydrea [? Sec to weight loss/fatigue]; April 2019- platelets- 505- Re-start Hydrea 500 q OD.   # previous TIA   # March 2018- pancreatic cyst [incidental];   INTERVAL HISTORY:  84year-old male patient with above history of essential thrombocytosis currently on surveillance is here for follow-up. Patient's Hydrea has been held since march 2018- because of poor tolerance last weight .  Patient denies any nausea vomiting.  Denies any loss of appetite.  Denies any abdominal pain. Patient denies any recent blood clots or any strokes or TIAs. Denies any burning pain in his extremities. Otherwise no nausea vomiting.  REVIEW OF SYSTEMS:  A complete 10 point review of system is done which is negative except mentioned above/history of present illness.   PAST MEDICAL HISTORY :  Past Medical History:  Diagnosis Date  . Colon polyps   . [place]   . [place]ential thrombocytosis ([facility]   . Hyperlipidemia   . Hypertension   . Lung nodule 12/2004   found on CXR  . Osteoporosis     PAST SURGICAL HISTORY :   Past Surgical History:  Procedure Laterality Date  . BONE MARROW BIOPSY  01/18/07  . COLONOSCOPY  2007, 2010   Dr WAllen Norris . NASAL SINUS SURGERY  06/11/05    FAMILY HISTORY :   Family History  Problem Relation Age of Onset  . Colon cancer Mother        colon age 785's . Peripheral vascular disease Father   . Colon cancer Brother    colon age 10769's . Diabetes Unknown   . Prostate cancer Neg Hx   . Bladder Cancer Neg Hx   . Kidney cancer Neg Hx     SOCIAL HISTORY:   Social History   Tobacco Use  . Smoking status: Never Smoker  . Smokeless tobacco: Never Used  Substance Use Topics  . Alcohol use: No  . Drug use: No    ALLERGIES:  is allergic to amlodipine; atenolol; erythromycin; furosemide; hctz [hydrochlorothiazide]; levofloxacin; metoprolol; micardis [telmisartan]; penicillins; terazosin; and triamterene-hctz.  MEDICATIONS:  Current Outpatient Medications  Medication Sig Dispense Refill  . amLODipine (NORVASC) 5 MG tablet TAKE 1 TABLET BY MOUTH ONCE DAILY 30 tablet 3  . aspirin EC 81 MG tablet Take 81 mg by mouth daily. Reported on 08/14/2015    . Calcium Citrate-Vitamin D (CITRACAL + D PO) Take 3 tablets by mouth daily. Take 2 by mouth daily, calcium 630 and vitamin D 500 each.    . Cholecalciferol (VITAMIN D3) 1000 UNITS CAPS Take 2,000 Units by mouth daily. Take 2 by mouth daily    . Cyanocobalamin (B-12 COMPLIANCE INJECTION) 1000 MCG/ML KIT Inject as directed.    . fluticasone (FLONASE) 50 MCG/ACT nasal spray Place 1 spray into both nostrils daily.     . hydrALAZINE (APRESOLINE) 50 MG tablet TAKE 1 TABLET BY MOUTH 3 TIMES DAILY 90 tablet 1  . lisinopril (PRINIVIL,ZESTRIL) 40 MG tablet TAKE 1 TABLET BY MOUTH ONCE DAILY 90 tablet 1  . docusate sodium (COLACE) 100  MG capsule Take 100 mg by mouth 2 (two) times daily as needed for mild constipation.    . hydroxyurea (HYDREA) 500 MG capsule Take 1 pill every other day . may take with food to minimize GI side effects. 60 capsule 3   Current Facility-Administered Medications  Medication Dose Route Frequency Provider Last Rate Last Dose  . testosterone cypionate (DEPOTESTOSTERONE CYPIONATE) injection 50 mg  50 mg Intramuscular Q14 Days Crecencio Mc, MD   50 mg at 06/09/17 1045    PHYSICAL EXAMINATION:   BP (!) 176/62   Pulse 73   Temp 97.9 F (36.6 C)  (Tympanic)   Resp 20   There were no vitals filed for this visit.  GENERAL: Well-nourished well-developed; Alert, no distress and comfortable. Accompanied by his wife. EYES: no pallor or icterus OROPHARYNX: no thrush or ulceration; good dentition  NECK: supple, no masses felt LYMPH:  no palpable lymphadenopathy in the cervical, axillary or inguinal regions LUNGS: clear to auscultation and  No wheeze or crackles HEART/CVS: regular rate & rhythm and no murmurs; No lower extremity edema ABDOMEN:abdomen soft, non-tender and normal bowel sounds Musculoskeletal:no cyanosis of digits and no clubbing  PSYCH: alert & oriented x 3 with fluent speech NEURO: no focal motor/sensory deficits SKIN:  no rashes or significant lesions  LABORATORY DATA:  I have reviewed the data as listed    Component Value Date/Time   NA 133 (L) 06/13/2017 1311   NA 136 05/19/2011 1049   K 4.0 06/13/2017 1311   K 4.3 05/19/2011 1049   CL 94 (L) 06/13/2017 1311   CL 99 05/19/2011 1049   CO2 30 06/13/2017 1311   CO2 32 05/19/2011 1049   GLUCOSE 112 (H) 06/13/2017 1311   GLUCOSE 116 (H) 05/19/2011 1049   BUN 20 06/13/2017 1311   BUN 8 05/19/2011 1049   CREATININE 1.10 06/14/2017 0912   CREATININE 1.15 12/24/2013 0921   CALCIUM 9.8 06/13/2017 1311   CALCIUM 9.0 05/19/2011 1049   PROT 7.1 06/13/2017 1311   PROT 6.8 12/24/2013 0921   ALBUMIN 4.3 06/13/2017 1311   ALBUMIN 4.0 12/24/2013 0921   AST 21 06/13/2017 1311   AST 23 12/24/2013 0921   ALT 18 06/13/2017 1311   ALT 19 12/24/2013 0921   ALKPHOS 68 06/13/2017 1311   ALKPHOS 57 12/24/2013 0921   BILITOT 1.5 (H) 06/13/2017 1311   BILITOT 0.9 12/24/2013 0921   GFRNONAA 58 (L) 06/13/2017 1311   GFRNONAA >60 12/24/2013 0921   GFRNONAA 50 (L) 09/03/2013 0930   GFRAA >60 06/13/2017 1311   GFRAA >60 12/24/2013 0921   GFRAA 58 (L) 09/03/2013 0930    No results found for: SPEP, UPEP  Lab Results  Component Value Date   WBC 10.7 (H) 06/13/2017    NEUTROABS 7.9 (H) 06/13/2017   HGB 16.2 06/13/2017   HCT 47.1 06/13/2017   MCV 93.5 06/13/2017   PLT 505 (H) 06/13/2017      Chemistry      Component Value Date/Time   NA 133 (L) 06/13/2017 1311   NA 136 05/19/2011 1049   K 4.0 06/13/2017 1311   K 4.3 05/19/2011 1049   CL 94 (L) 06/13/2017 1311   CL 99 05/19/2011 1049   CO2 30 06/13/2017 1311   CO2 32 05/19/2011 1049   BUN 20 06/13/2017 1311   BUN 8 05/19/2011 1049   CREATININE 1.10 06/14/2017 0912   CREATININE 1.15 12/24/2013 0921      Component Value Date/Time  CALCIUM 9.8 06/13/2017 1311   CALCIUM 9.0 05/19/2011 1049   ALKPHOS 68 06/13/2017 1311   ALKPHOS 57 12/24/2013 0921   AST 21 06/13/2017 1311   AST 23 12/24/2013 0921   ALT 18 06/13/2017 1311   ALT 19 12/24/2013 0921   BILITOT 1.5 (H) 06/13/2017 1311   BILITOT 0.9 12/24/2013 0921       RADIOGRAPHIC STUDIES: I have personally reviewed the radiological images as listed and agreed with the findings in the report. No results found.   ASSESSMENT & PLAN:   Essential thrombocytosis ([place]) # History of essential thrombocytosis- off Hydrea is March 2018. Today CBC is within normal except Platelets normal 479.   # Weight loss- unclear etiology -currently stable.   # Pancreatic cyst- recommend surveillance MRI in March 2019.   # Elevated Blood pressure- 759- systolic. Better at home as per pt. Defer to PCP.  # labs in  3 months;MD/MRI prior.   CC: Dr.Tullo.  Mass of pancreas # Pancreatic cyst; ~ 2.5 cm; slightly more solid compared to imaging in July 2018.  Continues to be asymptomatic.  Reviewed imaging of the tumor conference; recommendation is to follow-up imaging in 6 months.  Interestingly CA-19-9 elevated at 41; will repeat again in 3 months.  If going up then recommend repeat imaging sooner.  If this turns out to be pancreatic adenocarcinoma-patient poor candidate for any aggressive therapies; question possible radiation.  # History of essential  thrombocytosis [NEG jak/MLP/CALR]- off Hydrea is March 2018. Today CBC is within normal except Platelets normal 505; re-start hydrea 500 mg q other day. New script given.  # Elevated Blood pressure- 163- systolic. Better at home [130-140s] as per pt. Defer to PCP.  # labs in 3 months- cbc/cmp/ca-19-9 few days prior. Will order MRI at that time.   CC: Dr.Tullo.     Cammie Sickle, MD 06/16/2017 3:06 PM

## 2017-06-16 NOTE — Assessment & Plan Note (Signed)
#   Pancreatic cyst; ~ 2.5 cm; slightly more solid compared to imaging in July 2018.  Continues to be asymptomatic.  Reviewed imaging of the tumor conference; recommendation is to follow-up imaging in 6 months.  Interestingly CA-19-9 elevated at 41; will repeat again in 3 months.  If going up then recommend repeat imaging sooner.  If this turns out to be pancreatic adenocarcinoma-patient poor candidate for any aggressive therapies; question possible radiation.  # History of essential thrombocytosis [NEG jak/MLP/CALR]- off Hydrea is March 2018. Today CBC is within normal except Platelets normal 505; re-start hydrea 500 mg q other day. New script given.  # Elevated Blood pressure- 814- systolic. Better at home [130-140s] as per pt. Defer to PCP.  # labs in 3 months- cbc/cmp/ca-19-9 few days prior. Will order MRI at that time.   CC: Dr.Tullo.

## 2017-06-16 NOTE — Assessment & Plan Note (Signed)
#   History of essential thrombocytosis- off Hydrea is March 2018. Today CBC is within normal except Platelets normal 479.   # Weight loss- unclear etiology -currently stable.   # Pancreatic cyst- recommend surveillance MRI in March 2019.   # Elevated Blood pressure- 219- systolic. Better at home as per pt. Defer to PCP.  # labs in  3 months;MD/MRI prior.   CC: Dr.Tullo.

## 2017-06-17 ENCOUNTER — Ambulatory Visit: Payer: PPO | Admitting: Internal Medicine

## 2017-06-23 ENCOUNTER — Ambulatory Visit (INDEPENDENT_AMBULATORY_CARE_PROVIDER_SITE_OTHER): Payer: PPO

## 2017-06-23 DIAGNOSIS — E538 Deficiency of other specified B group vitamins: Secondary | ICD-10-CM

## 2017-06-23 DIAGNOSIS — E291 Testicular hypofunction: Secondary | ICD-10-CM | POA: Diagnosis not present

## 2017-06-23 MED ORDER — TESTOSTERONE CYPIONATE 200 MG/ML IM SOLN
50.0000 mg | INTRAMUSCULAR | Status: DC
  Administered 2017-06-23: 50 mg via INTRAMUSCULAR

## 2017-06-23 MED ORDER — CYANOCOBALAMIN 1000 MCG/ML IJ SOLN
1000.0000 ug | Freq: Once | INTRAMUSCULAR | Status: AC
  Administered 2017-06-23: 1000 ug via INTRAMUSCULAR

## 2017-06-23 NOTE — Progress Notes (Addendum)
Pt presented today for testosterone injection. Left upper outer quadrant. Pt voiced no concerns nor showed any signs of distress during injection. Pt also received B12 injection. Left deltoid. Pt voiced no concerns nor showed any signs of distress during the injection.   Reviewed.  Dr Nicki Reaper

## 2017-07-07 ENCOUNTER — Ambulatory Visit (INDEPENDENT_AMBULATORY_CARE_PROVIDER_SITE_OTHER): Payer: PPO | Admitting: *Deleted

## 2017-07-07 DIAGNOSIS — R7989 Other specified abnormal findings of blood chemistry: Secondary | ICD-10-CM | POA: Diagnosis not present

## 2017-07-07 MED ORDER — TESTOSTERONE CYPIONATE 200 MG/ML IM SOLN: 50.0000 mg | Freq: Once | INTRAMUSCULAR | Status: DC

## 2017-07-07 NOTE — Progress Notes (Signed)
Patient presented for testosterone injection to right upper quadrant, patient tolerated well.

## 2017-07-13 DIAGNOSIS — L304 Erythema intertrigo: Secondary | ICD-10-CM | POA: Diagnosis not present

## 2017-07-21 ENCOUNTER — Ambulatory Visit (INDEPENDENT_AMBULATORY_CARE_PROVIDER_SITE_OTHER): Payer: PPO | Admitting: *Deleted

## 2017-07-21 DIAGNOSIS — R7989 Other specified abnormal findings of blood chemistry: Secondary | ICD-10-CM | POA: Diagnosis not present

## 2017-07-21 DIAGNOSIS — E538 Deficiency of other specified B group vitamins: Secondary | ICD-10-CM

## 2017-07-21 MED ORDER — CYANOCOBALAMIN 1000 MCG/ML IJ SOLN
1000.0000 ug | Freq: Once | INTRAMUSCULAR | Status: AC
  Administered 2017-07-21: 1000 ug via INTRAMUSCULAR

## 2017-07-21 MED ORDER — TESTOSTERONE CYPIONATE 200 MG/ML IM SOLN
50.0000 mg | Freq: Once | INTRAMUSCULAR | Status: AC
  Administered 2017-07-21: 50 mg via INTRAMUSCULAR

## 2017-07-21 NOTE — Progress Notes (Signed)
Patient presented for B 12 injection to left deltoid, patient voiced no concerns nor showed any signs of distress during injection.   Testosterone injection given in LUQ patient tolerated injection well.

## 2017-07-25 NOTE — Progress Notes (Signed)
  I have reviewed the above information and agree with above.   Reilly Blades, MD 

## 2017-07-28 ENCOUNTER — Ambulatory Visit: Payer: PPO

## 2017-08-04 ENCOUNTER — Ambulatory Visit (INDEPENDENT_AMBULATORY_CARE_PROVIDER_SITE_OTHER): Payer: PPO

## 2017-08-04 DIAGNOSIS — E291 Testicular hypofunction: Secondary | ICD-10-CM | POA: Diagnosis not present

## 2017-08-04 DIAGNOSIS — R7989 Other specified abnormal findings of blood chemistry: Secondary | ICD-10-CM

## 2017-08-04 MED ORDER — TESTOSTERONE CYPIONATE 200 MG/ML IM SOLN
50.0000 mg | Freq: Once | INTRAMUSCULAR | Status: AC
  Administered 2017-08-04: 50 mg via INTRAMUSCULAR

## 2017-08-04 NOTE — Progress Notes (Signed)
Pt presented today for testosterone injection. RUQ. Pt voiced on concerns nor showed any signs of distress during injection.

## 2017-08-06 ENCOUNTER — Other Ambulatory Visit: Payer: Self-pay | Admitting: Internal Medicine

## 2017-08-06 DIAGNOSIS — J209 Acute bronchitis, unspecified: Secondary | ICD-10-CM | POA: Diagnosis not present

## 2017-08-06 DIAGNOSIS — J019 Acute sinusitis, unspecified: Secondary | ICD-10-CM | POA: Diagnosis not present

## 2017-08-06 DIAGNOSIS — B9689 Other specified bacterial agents as the cause of diseases classified elsewhere: Secondary | ICD-10-CM | POA: Diagnosis not present

## 2017-08-16 DIAGNOSIS — J019 Acute sinusitis, unspecified: Secondary | ICD-10-CM | POA: Diagnosis not present

## 2017-08-18 ENCOUNTER — Ambulatory Visit (INDEPENDENT_AMBULATORY_CARE_PROVIDER_SITE_OTHER): Payer: PPO

## 2017-08-18 DIAGNOSIS — R7989 Other specified abnormal findings of blood chemistry: Secondary | ICD-10-CM | POA: Diagnosis not present

## 2017-08-18 DIAGNOSIS — E538 Deficiency of other specified B group vitamins: Secondary | ICD-10-CM | POA: Diagnosis not present

## 2017-08-18 MED ORDER — CYANOCOBALAMIN 1000 MCG/ML IJ SOLN
1000.0000 ug | Freq: Once | INTRAMUSCULAR | Status: AC
  Administered 2017-08-18: 1000 ug via INTRAMUSCULAR

## 2017-08-18 MED ORDER — TESTOSTERONE CYPIONATE 200 MG/ML IM SOLN
50.0000 mg | Freq: Once | INTRAMUSCULAR | Status: AC
Start: 2017-08-18 — End: 2017-08-18
  Administered 2017-08-18: 50 mg via INTRAMUSCULAR

## 2017-08-18 MED ORDER — TESTOSTERONE CYPIONATE 200 MG/ML IM SOLN: 200.0000 mg | Freq: Once | INTRAMUSCULAR | Status: DC

## 2017-08-18 MED ORDER — TESTOSTERONE CYPIONATE 200 MG/ML IM SOLN
50.0000 mg | Freq: Once | INTRAMUSCULAR | Status: AC
  Administered 2017-08-18: 50 mg via INTRAMUSCULAR

## 2017-08-18 MED ORDER — TESTOSTERONE CYPIONATE 100 MG/ML IM SOLN: 50.0000 mg | Freq: Once | INTRAMUSCULAR | Status: DC

## 2017-08-18 NOTE — Progress Notes (Signed)
Patient comes in for B12 injection and testosterone injection.   B12 injected into left deltoid .  Testosterone injected into left upper outer quadrant .  Patient tolerated procedure well.

## 2017-08-18 NOTE — Addendum Note (Signed)
Addended by: Johna Sheriff on: 08/18/2017 11:02 AM   Modules accepted: Orders

## 2017-08-21 NOTE — Progress Notes (Signed)
  I have reviewed the above information and agree with above.   Adonica Fukushima, MD 

## 2017-08-25 ENCOUNTER — Ambulatory Visit: Payer: PPO

## 2017-08-26 ENCOUNTER — Emergency Department
Admission: EM | Admit: 2017-08-26 | Discharge: 2017-08-26 | Disposition: A | Discharge status: Home / Self Care | Payer: PPO | Attending: Emergency Medicine | Admitting: Emergency Medicine

## 2017-08-26 ENCOUNTER — Other Ambulatory Visit: Payer: Self-pay

## 2017-08-26 ENCOUNTER — Emergency Department: Payer: PPO

## 2017-08-26 DIAGNOSIS — Z7982 Long term (current) use of aspirin: Secondary | ICD-10-CM | POA: Insufficient documentation

## 2017-08-26 DIAGNOSIS — R079 Chest pain, unspecified: Secondary | ICD-10-CM | POA: Diagnosis present

## 2017-08-26 DIAGNOSIS — J069 Acute upper respiratory infection, unspecified: Secondary | ICD-10-CM | POA: Insufficient documentation

## 2017-08-26 DIAGNOSIS — J40 Bronchitis, not specified as acute or chronic: Secondary | ICD-10-CM | POA: Diagnosis not present

## 2017-08-26 DIAGNOSIS — I10 Essential (primary) hypertension: Secondary | ICD-10-CM | POA: Insufficient documentation

## 2017-08-26 DIAGNOSIS — R0602 Shortness of breath: Secondary | ICD-10-CM | POA: Diagnosis not present

## 2017-08-26 DIAGNOSIS — Z79899 Other long term (current) drug therapy: Secondary | ICD-10-CM | POA: Diagnosis not present

## 2017-08-26 LAB — BASIC METABOLIC PANEL
Anion gap: 10 (ref 5–15)
BUN: 16 mg/dL (ref 8–23)
CO2: 28 mmol/L (ref 22–32)
Calcium: 9.6 mg/dL (ref 8.9–10.3)
Chloride: 94 mmol/L — ABNORMAL LOW (ref 98–111)
Creatinine, Ser: 0.84 mg/dL (ref 0.61–1.24)
GFR calc Af Amer: >60 mL/min (ref >60)
GFR calc non Af Amer: >60 mL/min (ref >60)
GLUCOSE: 109 mg/dL — AB (ref 70–99)
POTASSIUM: 3.5 mmol/L (ref 3.5–5.1)
Sodium: 132 mmol/L — ABNORMAL LOW (ref 135–145)

## 2017-08-26 LAB — CBC
HEMATOCRIT: 43.3 % (ref 40.0–52.0)
Hemoglobin: 14.8 g/dL (ref 13.0–18.0)
MCH: 33.5 pg (ref 26.0–34.0)
MCHC: 34.3 g/dL (ref 32.0–36.0)
MCV: 97.7 fL (ref 80.0–100.0)
Platelets: 561 10*3/uL — ABNORMAL HIGH (ref 150–440)
RBC: 4.43 MIL/uL (ref 4.40–5.90)
RDW: 14.8 % — AB (ref 11.5–14.5)
WBC: 7.8 10*3/uL (ref 3.8–10.6)

## 2017-08-26 LAB — TROPONIN I: Troponin I: <0.03 ng/mL (ref <0.03)

## 2017-08-26 MED ORDER — ALBUTEROL SULFATE HFA 108 (90 BASE) MCG/ACT IN AERS: 2.0000 | INHALATION_SPRAY | Freq: Four times a day (QID) | RESPIRATORY_TRACT | 2 refills | Status: DC | PRN

## 2017-08-26 MED ORDER — GUAIFENESIN 200 MG PO TABS: 400.0000 mg | ORAL_TABLET | Freq: Four times a day (QID) | ORAL | 0 refills | Status: DC | PRN

## 2017-08-26 MED ORDER — PREDNISONE 20 MG PO TABS: 40.0000 mg | ORAL_TABLET | Freq: Every day | ORAL | 0 refills | Status: DC

## 2017-08-26 MED ORDER — ALBUTEROL SULFATE (2.5 MG/3ML) 0.083% IN NEBU
2.5000 mg | INHALATION_SOLUTION | Freq: Once | RESPIRATORY_TRACT | Status: AC
  Administered 2017-08-26: 2.5 mg via RESPIRATORY_TRACT
  Filled 2017-08-26: qty 3

## 2017-08-26 NOTE — ED Notes (Signed)
Patient transported to X-ray 

## 2017-08-26 NOTE — ED Notes (Signed)
ED Provider at bedside. 

## 2017-08-26 NOTE — ED Notes (Signed)
First Nurse Note:  Patient complaining of chest pain.  Wife states "he may have pneumonia".  Placed in [place].

## 2017-08-26 NOTE — ED Notes (Signed)
Pt alert and oriented X4, active, cooperative, pt in NAD. RR even and unlabored, color WNL.  Pt informed to return if any life threatening symptoms occur.  Discharge and followup instructions reviewed.  

## 2017-08-26 NOTE — ED Notes (Signed)
First Nurse Note:  To room 1, Ally RN aware of room placement.

## 2017-08-26 NOTE — ED Provider Notes (Signed)
[facility] Emergency Department Provider Note ____________________________________________   First MD Initiated Contact with Patient 08/26/17 [phone]     (approximate)  I have reviewed the triage vital signs and the nursing notes.   HISTORY  Chief Complaint URI and Chest Pain    HPI Jimmy Jimenez is a 84 y.o. male with PMH as noted below who presents with cough over the last week, nonproductive, associated with nasal congestion and rhinorrhea, as well as with mild intermittent chest pain over the last few days.  Patient states that he feels slightly short of breath.  He denies fever, vomiting or diarrhea, or urinary symptoms.  He states he feels like he has a cold, but it has not been getting better over the last week and he became concerned.  His symptoms are not relieved by over-the-counter medications he is taking at home.   Past Medical History:  Diagnosis Date  . Colon polyps   . Essential thrombocytosis ([place])   . Essential thrombocytosis ([place])   . Hyperlipidemia   . Hypertension   . Lung nodule 12/2004   found on CXR  . Osteoporosis     Patient Active Problem List   Diagnosis Date Noted  . Colitis 09/09/2016  . Mass of pancreas 04/23/2016  . B12 deficiency 08/17/2015  . Testicular insufficiency 08/06/2015  . Low serum testosterone 05/20/2015  . Constipation 03/31/2015  . Pulmonary fibrosis ([place]) 12/12/2014  . Rectal bleeding 12/12/2014  . Abnormal weight loss 08/20/2014  . Xerosis of skin 12/18/2013  . Medicare annual wellness visit, subsequent 09/28/2013  . Statin intolerance 09/26/2013  . Pulmonary hypertension, mild ([place]) 12/14/2012  . Rhinitis, nonallergic 12/14/2012  . Solitary pulmonary nodule 10/16/2012  . Heart failure, diastolic, due to HTN ([place]) 10/16/2012  . Bradycardia, sinus 09/02/2012  . Hyponatremia 12/01/2011  . Edema of both legs 10/30/2011  . Essential thrombocytosis ([place])   . Hyperlipidemia   . Hypertension   .  Osteoporosis     Past Surgical History:  Procedure Laterality Date  . BONE MARROW BIOPSY  01/18/07  . COLONOSCOPY  2007, 2010   Dr Allen Norris  . NASAL SINUS SURGERY  06/11/05    Prior to Admission medications   Medication Sig Start Date End Date Taking? Authorizing Provider  albuterol (PROVENTIL HFA;VENTOLIN HFA) 108 (90 Base) MCG/ACT inhaler Inhale 2 puffs into the lungs every 6 (six) hours as needed for wheezing or shortness of breath. 08/26/17   Arta Silence, MD  amLODipine (NORVASC) 5 MG tablet TAKE 1 TABLET BY MOUTH ONCE DAILY 05/17/17   Crecencio Mc, MD  aspirin EC 81 MG tablet Take 81 mg by mouth daily. Reported on 08/14/2015    [provider]  Calcium Citrate-Vitamin D (CITRACAL + D PO) Take 3 tablets by mouth daily. Take 2 by mouth daily, calcium 630 and vitamin D 500 each.    [provider]  Cholecalciferol (VITAMIN D3) 1000 UNITS CAPS Take 2,000 Units by mouth daily. Take 2 by mouth daily    [provider]  Cyanocobalamin (B-12 COMPLIANCE INJECTION) 1000 MCG/ML KIT Inject as directed.    [provider]  docusate sodium (COLACE) 100 MG capsule Take 100 mg by mouth 2 (two) times daily as needed for mild constipation.    [provider]  fluticasone (FLONASE) 50 MCG/ACT nasal spray Place 1 spray into both nostrils daily.  03/12/16   [provider]  guaiFENesin 200 MG tablet Take 2 tablets (400 mg total) by mouth every  6 (six) hours as needed for cough or to loosen phlegm. 08/26/17   Arta Silence, MD  hydrALAZINE (APRESOLINE) 50 MG tablet TAKE 1 TABLET BY MOUTH 3 TIMES DAILY 08/08/17   Crecencio Mc, MD  hydroxyurea (HYDREA) 500 MG capsule Take 1 pill every other day . may take with food to minimize GI side effects. 06/16/17   Cammie Sickle, MD  lisinopril (PRINIVIL,ZESTRIL) 40 MG tablet TAKE 1 TABLET BY MOUTH ONCE DAILY 05/06/17   Crecencio Mc, MD  predniSONE (DELTASONE) 20 MG tablet Take 2 tablets (40 mg total)  by mouth daily for 5 days. 08/26/17 08/31/17  Arta Silence, MD    Allergies Amlodipine; Atenolol; Erythromycin; Furosemide; Hctz [hydrochlorothiazide]; Hydrochlorothiazide w-triamterene; Levofloxacin; Metoprolol; Micardis [telmisartan]; Penicillins; Terazosin; and Triamterene-hctz  Family History  Problem Relation Age of Onset  . Colon cancer Mother        colon age 40's  . Peripheral vascular disease Father   . Colon cancer Brother        colon age 22's  . Diabetes Unknown   . Prostate cancer Neg Hx   . Bladder Cancer Neg Hx   . Kidney cancer Neg Hx     Social History Social History   Tobacco Use  . Smoking status: Never Smoker  . Smokeless tobacco: Never Used  Substance Use Topics  . Alcohol use: No  . Drug use: No    Review of Systems  Constitutional: No fever/chills. Eyes: No visual changes. ENT: No sore throat. Cardiovascular: Positive for chest pain. Respiratory: Positive for shortness of breath. Gastrointestinal: No vomiting.  No diarrhea.  Genitourinary: Negative for dysuria.  Musculoskeletal: Negative for back pain. Skin: Negative for rash. Neurological: Negative for headache.   ____________________________________________   PHYSICAL EXAM:  VITAL SIGNS: ED Triage Vitals  Enc Vitals Group     BP 08/26/17 0720 (!) 130/50     Pulse Rate 08/26/17 0720 76     Resp 08/26/17 0720 18     Temp 08/26/17 0720 97.6 F (36.4 C)     Temp Source 08/26/17 0720 Oral     SpO2 08/26/17 0720 98 %     Weight 08/26/17 0718 140 lb (63.5 kg)     Height 08/26/17 0718 5' 8"  (1.727 m)     Head Circumference --      Peak Flow --      Pain Score 08/26/17 0718 0     Pain Loc --      Pain Edu? --      Excl. in [place]? --     Constitutional: Alert and oriented.  Relatively well appearing for age and in no acute distress. Eyes: Conjunctivae are normal.  EOMI.   Head: Atraumatic. Nose: Nasal congestion. Mouth/Throat: Mucous membranes are moist.  Oropharynx clear with  no erythema or swelling. Neck: Normal range of motion.  Cardiovascular: Normal rate, regular rhythm. Grossly normal heart sounds.  Good peripheral circulation. Respiratory: Normal respiratory effort.  No retractions.  Scattered rales and rhonchi. Gastrointestinal: Soft and nontender. No distention.  Genitourinary: No flank tenderness. Musculoskeletal: No lower extremity edema.  Extremities warm and well perfused.  Neurologic:  Normal speech and language. No gross focal neurologic deficits are appreciated.  Skin:  Skin is warm and dry. No rash noted. Psychiatric: Mood and affect are normal. Speech and behavior are normal.  ____________________________________________   LABS (all labs ordered are listed, but only abnormal results are displayed)  Labs Reviewed  BASIC METABOLIC PANEL - Abnormal; Notable for  the following components:      Result Value   Sodium 132 (*)    Chloride 94 (*)    Glucose, Bld 109 (*)    All other components within normal limits  CBC - Abnormal; Notable for the following components:   RDW 14.8 (*)    Platelets 561 (*)    All other components within normal limits  TROPONIN I   ____________________________________________  EKG  ED ECG REPORT I, Arta Silence, the attending physician, personally viewed and interpreted this ECG.  Date: 08/26/2017 EKG Time: 0721 Rate: 76 Rhythm: normal sinus rhythm QRS Axis: Left axis deviation Intervals: normal ST/T Wave abnormalities: normal Narrative Interpretation: no evidence of acute ischemia  ____________________________________________  RADIOLOGY  CXR: No acute focal infiltrate  ____________________________________________   PROCEDURES  Procedure(s) performed: No  Procedures  Critical Care performed: No ____________________________________________   INITIAL IMPRESSION / ASSESSMENT AND PLAN / ED COURSE  Pertinent labs & imaging results that were available during my care of the patient were  reviewed by me and considered in my medical decision making (see chart for details).  84 year old male with PMH as noted above presents with URI symptoms over the last week associated with cough that he told me was nonproductive, as well as some mild intermittent chest pain over the last several days.  I reviewed the past medical records in Epic; patient has had no recent ED visits or admissions within the last year.  On exam, the patient is relatively well-appearing for his age.  He does appear slightly weak, but his vital signs are normal and the remainder of the exam is unremarkable except for scattered rales and rhonchi on lung exam.  Differential includes most likely viral URI or bronchitis, as well as less likely pneumonia.  I have a low suspicion for cardiac etiology.  We will obtain basic cardiac labs, chest x-ray, trial of nebulizer, and reassess.  ----------------------------------------- 9:39 AM on 08/26/2017 -----------------------------------------  Patient states he is feeling a bit better after albuterol.  Chest x-ray shows no infiltrate or other acute findings, and his lab work-up is unremarkable.  Given the duration of the symptoms, there is no indication for repeat troponin.  Patient feels comfortable to go home.  I will treat patient for acute bronchitis with albuterol, steroids, and cough medication.  Return precautions given, and he expresses understanding. ____________________________________________   FINAL CLINICAL IMPRESSION(S) / ED DIAGNOSES  Final diagnoses:  Upper respiratory tract infection, unspecified type  Bronchitis      NEW MEDICATIONS STARTED DURING THIS VISIT:  New Prescriptions   ALBUTEROL (PROVENTIL HFA;VENTOLIN HFA) 108 (90 BASE) MCG/ACT INHALER    Inhale 2 puffs into the lungs every 6 (six) hours as needed for wheezing or shortness of breath.   GUAIFENESIN 200 MG TABLET    Take 2 tablets (400 mg total) by mouth every 6 (six) hours as needed  for cough or to loosen phlegm.   PREDNISONE (DELTASONE) 20 MG TABLET    Take 2 tablets (40 mg total) by mouth daily for 5 days.     Note:  This document was prepared using Dragon voice recognition software and may include unintentional dictation errors.    Arta Silence, MD 08/26/17 [phone]

## 2017-08-26 NOTE — ED Triage Notes (Signed)
Pt c/o cough with yellow sputum for the past week with some intermittent chest pain. Denies SOB. Pt is in NAD on arrival . States "I have a cold".[name]

## 2017-08-26 NOTE — Discharge Instructions (Addendum)
You should use the inhaler every 4-6 hours as needed for cough or difficulty breathing.  Take the prednisone as prescribed and finish the full course.  Return to the ER for new, worsening, persistent severe cough, difficulty breathing, chest pain, weakness or lightheadedness, fevers, vomiting, or any other new or worsening symptoms that concern you.

## 2017-08-31 ENCOUNTER — Ambulatory Visit (INDEPENDENT_AMBULATORY_CARE_PROVIDER_SITE_OTHER): Payer: PPO | Admitting: Internal Medicine

## 2017-08-31 ENCOUNTER — Encounter: Payer: Self-pay | Admitting: Internal Medicine

## 2017-08-31 DIAGNOSIS — I10 Essential (primary) hypertension: Secondary | ICD-10-CM

## 2017-08-31 DIAGNOSIS — J209 Acute bronchitis, unspecified: Secondary | ICD-10-CM | POA: Diagnosis not present

## 2017-08-31 DIAGNOSIS — J45909 Unspecified asthma, uncomplicated: Secondary | ICD-10-CM | POA: Diagnosis not present

## 2017-08-31 MED ORDER — AMLODIPINE BESYLATE 5 MG PO TABS: 5.0000 mg | ORAL_TABLET | Freq: Every day | ORAL | 1 refills | Status: DC

## 2017-08-31 MED ORDER — HYDRALAZINE HCL 50 MG PO TABS: 50.0000 mg | ORAL_TABLET | Freq: Three times a day (TID) | ORAL | 1 refills | Status: DC

## 2017-08-31 MED ORDER — AZITHROMYCIN 250 MG PO TABS: ORAL_TABLET | ORAL | 0 refills | Status: DC

## 2017-08-31 MED ORDER — PREDNISONE 10 MG PO TABS: ORAL_TABLET | ORAL | 0 refills | Status: DC

## 2017-08-31 MED ORDER — BENZONATATE 200 MG PO CAPS: 200.0000 mg | ORAL_CAPSULE | Freq: Three times a day (TID) | ORAL | 1 refills | Status: DC | PRN

## 2017-08-31 NOTE — Progress Notes (Signed)
Subjective:  Patient ID: Jimmy Jimenez, male    DOB: 03/13/1925  Age: 84 y.o. MRN: [id number]  CC: Diagnoses of Bronchitis with asthma, acute and Essential hypertension were pertinent to this visit.  HPI Jimmy Jimenez presents for ER follow up for bronchitis.  Patient was treated  on June 28 for symptoms of productive cough, accompanied by rhinitis,  Chest pain and dyspnea of one week duration .  No fevers.  Chest x ray showed no acute changes,  But there were persistent peripheral based changed consistent with chronic  ILD changes seen   .  He was treated with albuterol,  mucinex and prednisone 40 mg daily x 5 days  He feels better,  Still  coughing but not as much,  And denies chest pain and dyspnea.    Hypertension: patient checks blood pressure twice weekly at home.  Readings have been for the most part < 140/80 at rest . Patient is following a reduce salt diet most days and is taking medications as prescribed    Outpatient Medications Prior to Visit  Medication Sig Dispense Refill  . albuterol (PROVENTIL HFA;VENTOLIN HFA) 108 (90 Base) MCG/ACT inhaler Inhale 2 puffs into the lungs every 6 (six) hours as needed for wheezing or shortness of breath. 1 Inhaler 2  . aspirin EC 81 MG tablet Take 81 mg by mouth daily. Reported on 08/14/2015    . Calcium Citrate-Vitamin D (CITRACAL + D PO) Take 3 tablets by mouth daily. Take 2 by mouth daily, calcium 630 and vitamin D 500 each.    . Cholecalciferol (VITAMIN D3) 1000 UNITS CAPS Take 2,000 Units by mouth daily. Take 2 by mouth daily    . clotrimazole-betamethasone (LOTRISONE) cream     . Cyanocobalamin (B-12 COMPLIANCE INJECTION) 1000 MCG/ML KIT Inject as directed.    . docusate sodium (COLACE) 100 MG capsule Take 100 mg by mouth 2 (two) times daily as needed for mild constipation.    . fluticasone (FLONASE) 50 MCG/ACT nasal spray Place 1 spray into both nostrils daily.     [name] guaiFENesin 200 MG tablet Take 2 tablets (400 mg total) by mouth every  6 (six) hours as needed for cough or to loosen phlegm. 30 tablet 0  . hydroxyurea (HYDREA) 500 MG capsule Take 1 pill every other day . may take with food to minimize GI side effects. 60 capsule 3  . lisinopril (PRINIVIL,ZESTRIL) 40 MG tablet TAKE 1 TABLET BY MOUTH ONCE DAILY 90 tablet 1  . amLODipine (NORVASC) 5 MG tablet TAKE 1 TABLET BY MOUTH ONCE DAILY 30 tablet 3  . hydrALAZINE (APRESOLINE) 50 MG tablet TAKE 1 TABLET BY MOUTH 3 TIMES DAILY 90 tablet 1  . predniSONE (DELTASONE) 20 MG tablet Take 2 tablets (40 mg total) by mouth daily for 5 days. 10 tablet 0   Facility-Administered Medications Prior to Visit  Medication Dose Route Frequency Provider Last Rate Last Dose  . testosterone cypionate (DEPOTESTOSTERONE CYPIONATE) injection 50 mg  50 mg Intramuscular Q14 Days Crecencio Mc, MD   50 mg at 07/07/17 1114  . testosterone cypionate (DEPOTESTOSTERONE CYPIONATE) injection 50 mg  50 mg Intramuscular Q14 Days Crecencio Mc, MD   50 mg at 06/23/17 0947  . testosterone cypionate (DEPOTESTOSTERONE CYPIONATE) injection 50 mg  50 mg Intramuscular Once Crecencio Mc, MD        Review of Systems;  Patient denies headache, fevers, malaise, unintentional weight loss, skin rash, eye pain, sinus congestion and sinus pain, sore throat,  dysphagia,  hemoptysis , cough, dyspnea, wheezing, chest pain, palpitations, orthopnea, edema, abdominal pain, nausea, melena, diarrhea, constipation, flank pain, dysuria, hematuria, urinary  Frequency, nocturia, numbness, tingling, seizures,  Focal weakness, Loss of consciousness,  Tremor, insomnia, depression, anxiety, and suicidal ideation.      Objective:  BP (!) 148/66 (BP Location: Left Arm, Patient Position: Sitting, Cuff Size: Normal)   Pulse 80   Temp 98.2 F (36.8 C) (Oral)   Resp 16   Ht _0  (1.727 m)   Wt 151 lb 9.6 oz (68.8 kg)   SpO2 92%   BMI 23.05 kg/m   BP Readings from Last 3 Encounters:  08/31/17 (!) 148/66  08/26/17 (!) 121/54    06/16/17 (!) 176/62    Wt Readings from Last 3 Encounters:  08/31/17 151 lb 9.6 oz (68.8 kg)  08/26/17 140 lb (63.5 kg)  04/01/17 146 lb 14.4 oz (66.6 kg)    General appearance: alert, cooperative and appears stated age Ears: normal TM's and external ear canals both ears Throat: lips, mucosa, and tongue normal; teeth and gums normal Neck: no adenopathy, no carotid bruit, supple, symmetrical, trachea midline and thyroid not enlarged, symmetric, no tenderness/mass/nodules Back: symmetric, no curvature. ROM normal. No CVA tenderness. Lungs: clear to auscultation bilaterally Heart: regular rate and rhythm, S1, S2 normal, no murmur, click, rub or gallop Abdomen: soft, non-tender; bowel sounds normal; no masses,  no organomegaly Pulses: 2+ and symmetric Skin: Skin color, texture, turgor normal. No rashes or lesions Lymph nodes: Cervical, supraclavicular, and axillary nodes normal.  No results found for: HGBA1C  Lab Results  Component Value Date   CREATININE 0.84 08/26/2017   CREATININE 1.10 06/14/2017   CREATININE 1.09 06/13/2017    Lab Results  Component Value Date   WBC 7.8 08/26/2017   HGB 14.8 08/26/2017   HCT 43.3 08/26/2017   PLT 561 (H) 08/26/2017   GLUCOSE 109 (H) 08/26/2017   LDLDIRECT 103.7 09/26/2013   ALT 18 06/13/2017   AST 21 06/13/2017   NA 132 (L) 08/26/2017   K 3.5 08/26/2017   CL 94 (L) 08/26/2017   CREATININE 0.84 08/26/2017   BUN 16 08/26/2017   CO2 28 08/26/2017   TSH 1.95 08/14/2015   PSA 0.88 08/19/2014   INR 1.02 08/31/2016   MICROALBUR 0.7 08/30/2012    Dg Chest 2 View  Result Date: 08/26/2017 CLINICAL DATA:  Chest pain and shortness of breath. EXAM: CHEST - 2 VIEW COMPARISON:  CT chest dated April 20, 2016. Chest x-ray dated August 06, 2015. FINDINGS: The heart size and mediastinal contours are within normal limits. Normal pulmonary vascularity. Atherosclerotic calcification of the aortic arch. Unchanged lower lobe predominant peripheral  interstitial thickening, consistent with chronic interstitial lung disease. No focal consolidation, pleural effusion, or pneumothorax. No acute osseous abnormality. IMPRESSION: 1.  No active cardiopulmonary disease. 2. Chronic interstitial lung disease, grossly unchanged. Electronically Signed   By: Titus Dubin M.D.   On: 08/26/2017 08:24    Assessment & Plan:   Problem List Items Addressed This Visit    Hypertension    Well controlled on current regimen. Renal function stable, no changes today.  Lab Results  Component Value Date   CREATININE 0.84 08/26/2017   Lab Results  Component Value Date   NA 132 (L) 08/26/2017   K 3.5 08/26/2017   CL 94 (L) 08/26/2017   CO2 28 08/26/2017         Relevant Medications   amLODipine (NORVASC) 5 MG tablet  hydrALAZINE (APRESOLINE) 50 MG tablet   Bronchitis with asthma, acute    ER recrods reviewed with pateint and wife. He has had mild to moderate improvement in symptoms without antibiotic use ,  But not resolved,  and he has interstitial lung disease.  Will prescribed azithromycin and a repeat prednisone taper if cough persists.       Relevant Medications   predniSONE (DELTASONE) 10 MG tablet      I have changed Abdishakur Weatherholtz's benzonatate, amLODipine, and hydrALAZINE. I am also having him start on azithromycin. Additionally, I am having him maintain his Calcium Citrate-Vitamin D (CITRACAL + D PO), Vitamin D3, aspirin EC, fluticasone, docusate sodium, Cyanocobalamin, lisinopril, hydroxyurea, albuterol, guaiFENesin, clotrimazole-betamethasone, and predniSONE. We will continue to administer testosterone cypionate, testosterone cypionate, and testosterone cypionate.  Meds ordered this encounter  Medications  . benzonatate (TESSALON) 200 MG capsule    Sig: Take 1 capsule (200 mg total) by mouth 3 (three) times daily as needed for cough.    Dispense:  60 capsule    Refill:  1  . azithromycin (ZITHROMAX) 250 MG tablet    Sig: 2 tablets  one day 1,  One tablet daily until gone    Dispense:  6 tablet    Refill:  0  . DISCONTD: predniSONE (DELTASONE) 10 MG tablet    Sig: 6 tablets on Day 1 , then reduce by 1 tablet daily until gone    Dispense:  21 tablet    Refill:  0  . amLODipine (NORVASC) 5 MG tablet    Sig: Take 1 tablet (5 mg total) by mouth daily.    Dispense:  90 tablet    Refill:  1  . hydrALAZINE (APRESOLINE) 50 MG tablet    Sig: Take 1 tablet (50 mg total) by mouth 3 (three) times daily.    Dispense:  270 tablet    Refill:  1  . predniSONE (DELTASONE) 10 MG tablet    Sig: 6 tablets on Day 1 , then reduce by 1 tablet daily until gone    Dispense:  21 tablet    Refill:  0   A total of 25 minutes of face to face time was spent with patient more than half of which was spent in counselling about the above mentioned conditions  and coordination of care   Medications Discontinued During This Encounter  Medication Reason  . predniSONE (DELTASONE) 20 MG tablet   . amLODipine (NORVASC) 5 MG tablet Reorder  . hydrALAZINE (APRESOLINE) 50 MG tablet Reorder  . predniSONE (DELTASONE) 10 MG tablet Reorder    Follow-up: No follow-ups on file.   Crecencio Mc, MD

## 2017-08-31 NOTE — Patient Instructions (Addendum)
I'm glad you are feeling better.  If your cough becomes worse over the holiday weekend,  Or if you  start to feel like you are getting  Sick again   Start the aztihromycin (antibiiotic)   And the prednisone taper    Taking an antibiotic can create an imbalance in the normal population of bacteria that live in the small intestine.  This imbalance can persist for 3 months.   Taking a probiotic ( Align, Floraque or Culturelle), the generic version of one of these over the counter medications, or an alternative form (kombucha,  Yogurt, or another dietary source) for a minimum of 3 weeks may help prevent a serious antibiotic associated diarrhea  Called clostridium dificile colitis that occurs when the bacteria population is altered .  Taking a probiotic may also prevent vaginitis due to yeast infections and can be continued indefinitely if you feel that it improves your digestion or your elimination (bowels).

## 2017-09-02 ENCOUNTER — Ambulatory Visit (INDEPENDENT_AMBULATORY_CARE_PROVIDER_SITE_OTHER): Payer: PPO | Admitting: *Deleted

## 2017-09-02 DIAGNOSIS — R7989 Other specified abnormal findings of blood chemistry: Secondary | ICD-10-CM | POA: Diagnosis not present

## 2017-09-03 DIAGNOSIS — J209 Acute bronchitis, unspecified: Secondary | ICD-10-CM | POA: Insufficient documentation

## 2017-09-03 DIAGNOSIS — J45909 Unspecified asthma, uncomplicated: Principal | ICD-10-CM

## 2017-09-03 NOTE — Assessment & Plan Note (Signed)
ER recrods reviewed with pateint and wife. He has had mild to moderate improvement in symptoms without antibiotic use ,  But not resolved,  and he has interstitial lung disease.  Will prescribed azithromycin and a repeat prednisone taper if cough persists.

## 2017-09-03 NOTE — Assessment & Plan Note (Signed)
Well controlled on current regimen. Renal function stable, no changes today.  Lab Results  Component Value Date   CREATININE 0.84 08/26/2017   Lab Results  Component Value Date   NA 132 (L) 08/26/2017   K 3.5 08/26/2017   CL 94 (L) 08/26/2017   CO2 28 08/26/2017

## 2017-09-05 ENCOUNTER — Other Ambulatory Visit: Payer: Self-pay | Admitting: Internal Medicine

## 2017-09-06 MED ORDER — TESTOSTERONE CYPIONATE 200 MG/ML IM SOLN
50.0000 mg | Freq: Once | INTRAMUSCULAR | Status: AC
  Administered 2017-09-02: 50 mg via INTRAMUSCULAR

## 2017-09-06 NOTE — Progress Notes (Signed)
Patient presented for testosterone injection into right upper quadrant , patient tolerated well.

## 2017-09-08 ENCOUNTER — Ambulatory Visit: Payer: PPO

## 2017-09-11 IMAGING — CT CT ABD-PELV W/ CM
2 of 5 series · 15 of 46 positions shown, 17 images · IV contrast (APPLIED)
Comparison: Radiographs earlier this day.  CT 04/20/2016

CLINICAL DATA: Lower abdominal bleeding.  Constipation for 3 weeks.

EXAM:
CT ABDOMEN AND PELVIS WITH CONTRAST
TECHNIQUE: Multidetector CT imaging of the abdomen and pelvis was performed
using the standard protocol following bolus administration of
intravenous contrast.
CONTRAST:  100mL VRN8E2-T11 IOPAMIDOL (VRN8E2-T11) INJECTION 61%

[Series 2: axial st · axial · 0.79mm/px · z∈[-874,-430]mm · 12 of 99 slices shown, 14 images]
[im 5/99  soft-tissue]
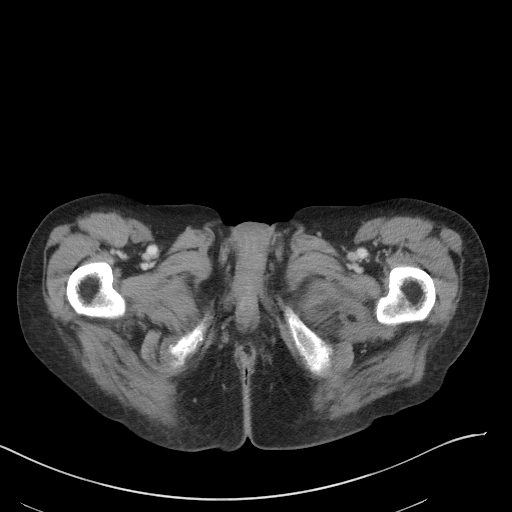
[im 5/99  bone]
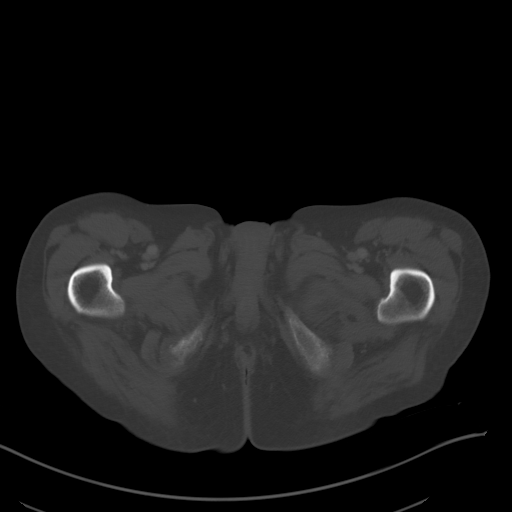
[im 15/99  soft-tissue]
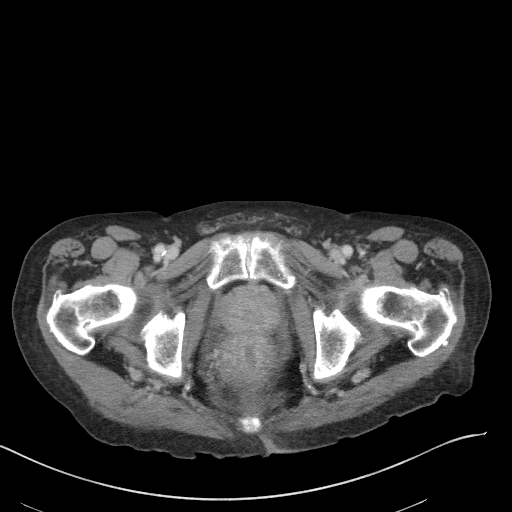
[im 24/99  soft-tissue]
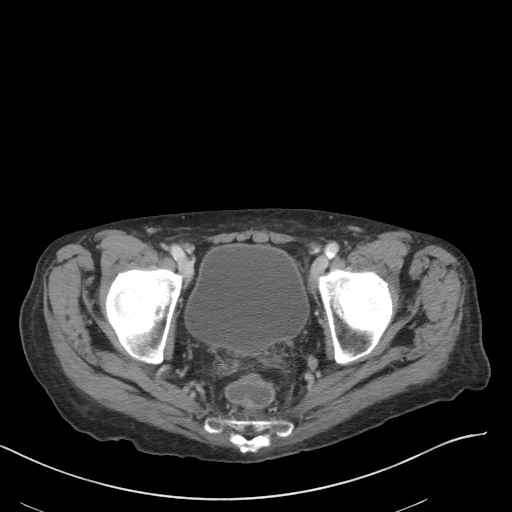
[im 29/99  soft-tissue]
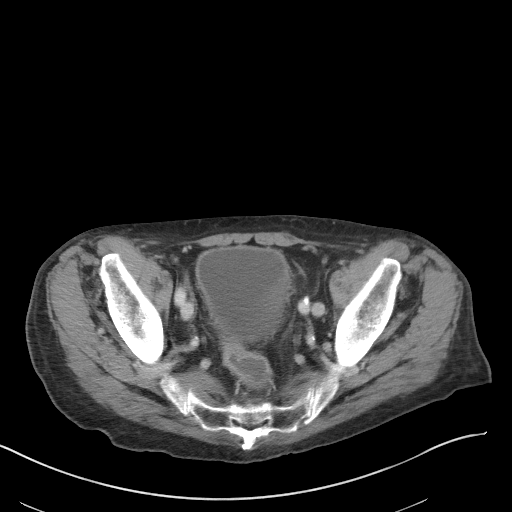
[im 38/99  soft-tissue]
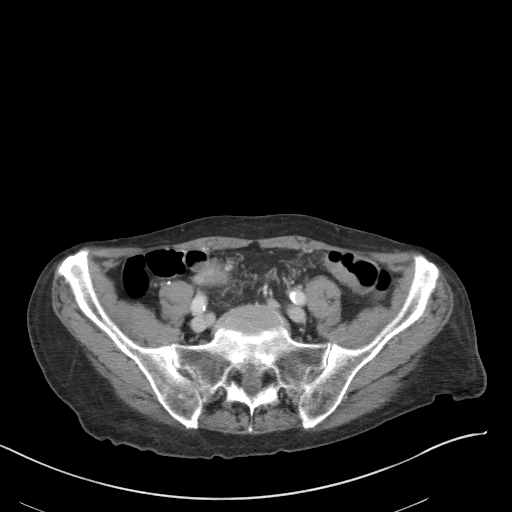
[im 47/99  soft-tissue]
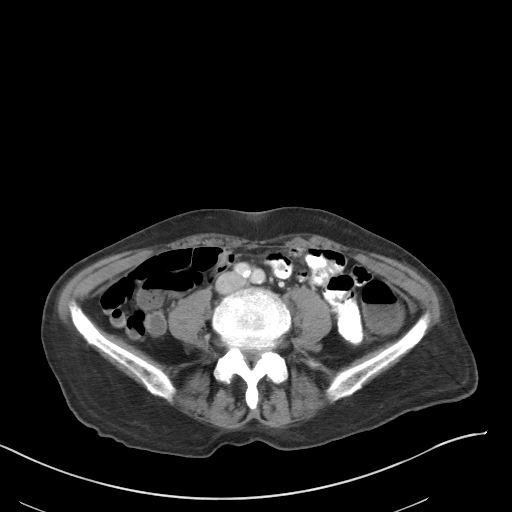
[im 52/99  soft-tissue]
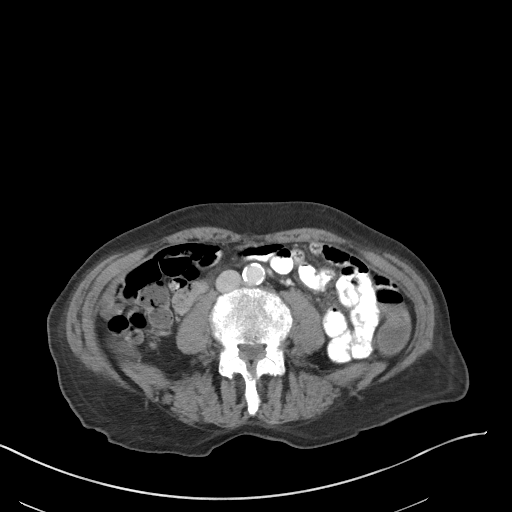
[im 61/99  soft-tissue]
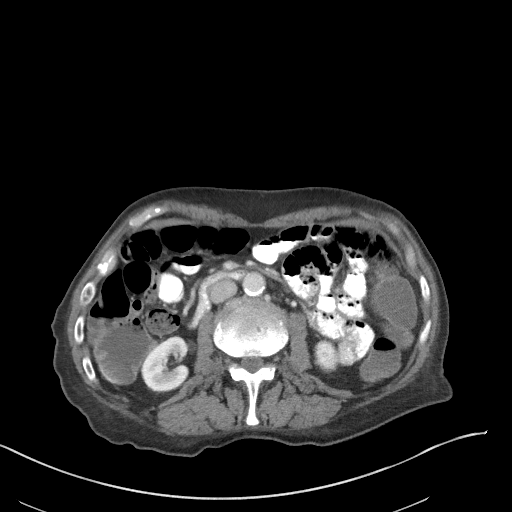
[im 71/99  soft-tissue]
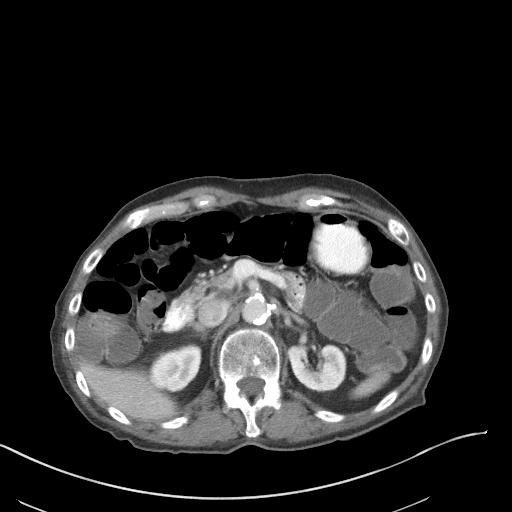
[im 71/99  bone]
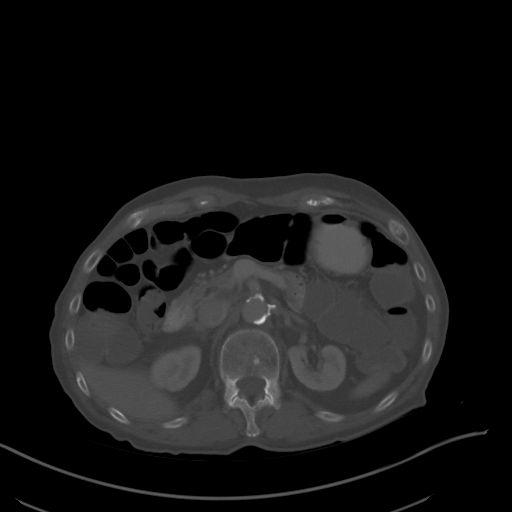
[im 75/99  soft-tissue]
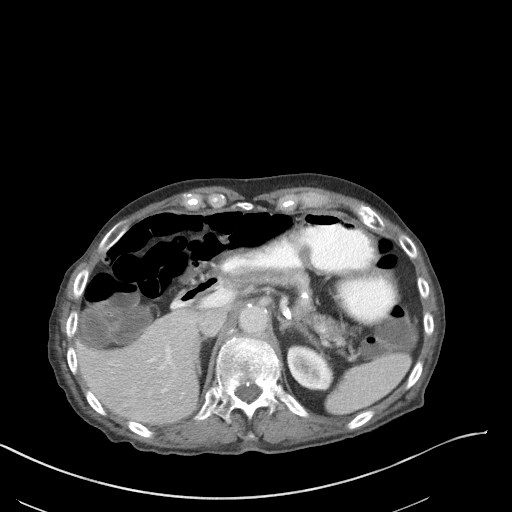
[im 85/99  soft-tissue]
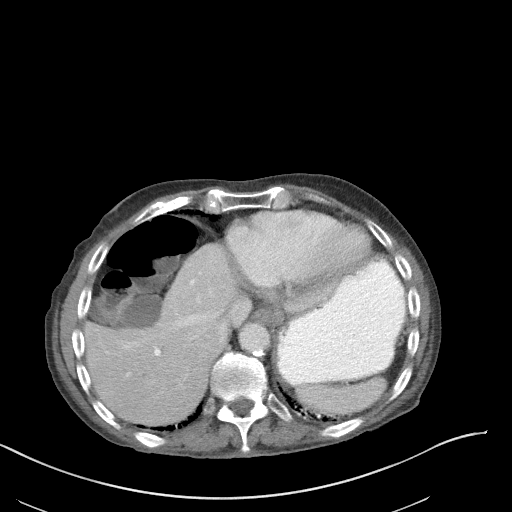
[im 94/99  soft-tissue]
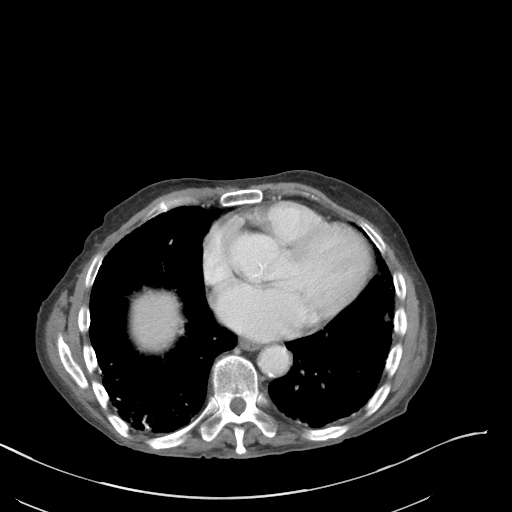

[Series 5: coronal st · coronal · 0.68mm/px · 3 of 72 slices shown]
[im 24/72  soft-tissue]
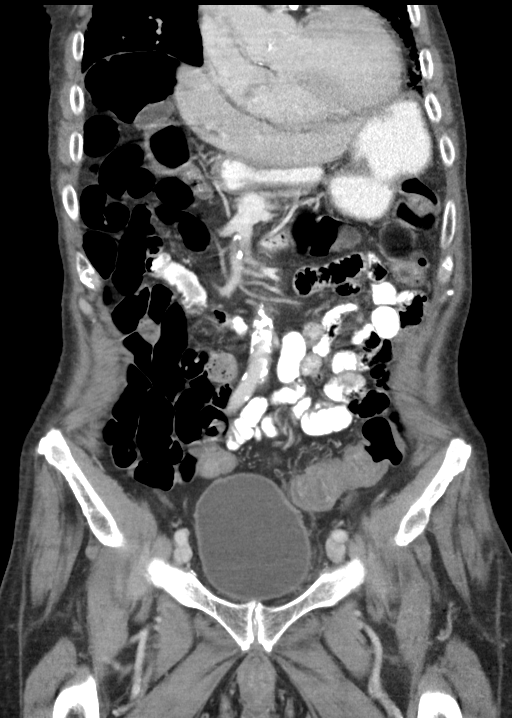
[im 32/72  soft-tissue]
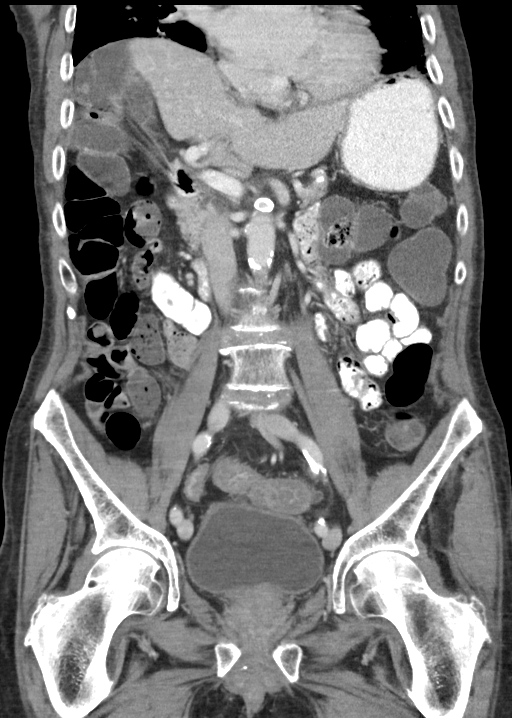
[im 40/72  soft-tissue]
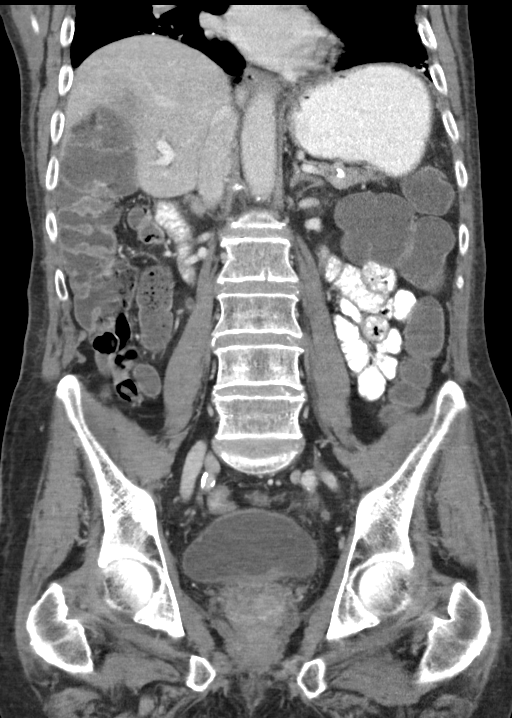

[15 of 46 positions shown; findings below may reference images not displayed]

FINDINGS: Lower chest: Subpleural reticulation, architectural distortion and
peripheral honeycombing in the lung bases. Right greater than left
basilar calcifications are unchanged per This is stable from prior
exam. Mild cardiomegaly. Coronary artery calcifications.

Hepatobiliary: No focal hepatic lesion. Gallbladder physiologically
distended, no calcified stone. No biliary dilatation.

Pancreas: 16 mm low-density lesion in the pancreatic head, unchanged
from CT and MRI earlier this year. No ductal dilatation or
inflammation.

Spleen: Normal in size without focal abnormality. Small splenule
inferiorly.

Adrenals/Urinary Tract: No adrenal nodule. No hydronephrosis or
perinephric edema. Two small cysts within right greater than left
renal cortex, unchanged. No suspicious renal lesion. Homogeneous
enhancement and excretion on delayed phase imaging. Urinary bladder
is physiologically distended.

Stomach/Bowel: Colonic wall thickening and enhancement involving a
moderate length segment of rectosigmoid colon extending to the
rectum. Mild pericolonic soft tissue edema. Liquid stool in the more
proximal colon without additional colonic wall thickening. Normal
appendix. Stomach is physiologically distended. No small bowel
inflammation, wall thickening or obstruction. Mild fecalization of
distal small bowel contents.

Vascular/Lymphatic: Aortic and branch atherosclerosis. No aneurysm.
No enlarged abdominal or pelvic lymph nodes.

Reproductive:  Unchanged prostate gland enlargement.

Other: No ascites.  No free air or abscess.

Musculoskeletal: There are no acute or suspicious osseous
abnormalities. Scatter tail body hemangiomas. Degenerative change in
the lower lumbar spine.
IMPRESSION: 1. Colitis involving a moderate length sigmoid and rectosigmoid
colon. This may be infectious, inflammatory or ischemic. Liquid
stool in the more proximal colon.
2. Unchanged size of cystic pancreatic head lesion. No acute
pancreatic inflammation. Follow-up recommended per prior MRI.
3. Aortic atherosclerosis.

## 2017-09-13 ENCOUNTER — Inpatient Hospital Stay: Payer: PPO | Attending: Internal Medicine

## 2017-09-13 DIAGNOSIS — R5381 Other malaise: Secondary | ICD-10-CM | POA: Diagnosis not present

## 2017-09-13 DIAGNOSIS — M810 Age-related osteoporosis without current pathological fracture: Secondary | ICD-10-CM | POA: Insufficient documentation

## 2017-09-13 DIAGNOSIS — R5383 Other fatigue: Secondary | ICD-10-CM | POA: Diagnosis not present

## 2017-09-13 DIAGNOSIS — R531 Weakness: Secondary | ICD-10-CM | POA: Insufficient documentation

## 2017-09-13 DIAGNOSIS — E785 Hyperlipidemia, unspecified: Secondary | ICD-10-CM | POA: Insufficient documentation

## 2017-09-13 DIAGNOSIS — Z8673 Personal history of transient ischemic attack (TIA), and cerebral infarction without residual deficits: Secondary | ICD-10-CM | POA: Diagnosis not present

## 2017-09-13 DIAGNOSIS — K869 Disease of pancreas, unspecified: Secondary | ICD-10-CM | POA: Insufficient documentation

## 2017-09-13 DIAGNOSIS — Z79899 Other long term (current) drug therapy: Secondary | ICD-10-CM | POA: Diagnosis not present

## 2017-09-13 DIAGNOSIS — D473 Essential (hemorrhagic) thrombocythemia: Secondary | ICD-10-CM | POA: Insufficient documentation

## 2017-09-13 DIAGNOSIS — I10 Essential (primary) hypertension: Secondary | ICD-10-CM | POA: Diagnosis not present

## 2017-09-13 DIAGNOSIS — Z800 Family history of malignant neoplasm of digestive organs: Secondary | ICD-10-CM | POA: Diagnosis not present

## 2017-09-13 DIAGNOSIS — Z7982 Long term (current) use of aspirin: Secondary | ICD-10-CM | POA: Insufficient documentation

## 2017-09-13 LAB — CBC WITH DIFFERENTIAL/PLATELET
BASOS PCT: 1 %
Basophils Absolute: 0.1 10*3/uL (ref 0–0.1)
EOS ABS: 0.2 10*3/uL (ref 0–0.7)
Eosinophils Relative: 2 %
HCT: 40.1 % (ref 40.0–52.0)
HEMOGLOBIN: 13.6 g/dL (ref 13.0–18.0)
LYMPHS ABS: 1.4 10*3/uL (ref 1.0–3.6)
Lymphocytes Relative: 13 %
MCH: 33.8 pg (ref 26.0–34.0)
MCHC: 34 g/dL (ref 32.0–36.0)
MCV: 99.4 fL (ref 80.0–100.0)
Monocytes Absolute: 1 10*3/uL (ref 0.2–1.0)
Monocytes Relative: 9 %
NEUTROS ABS: 8.7 10*3/uL — AB (ref 1.4–6.5)
NEUTROS PCT: 75 %
Platelets: 437 10*3/uL (ref 150–440)
RBC: 4.03 MIL/uL — AB (ref 4.40–5.90)
RDW: 14.7 % — ABNORMAL HIGH (ref 11.5–14.5)
WBC: 11.5 10*3/uL — AB (ref 3.8–10.6)

## 2017-09-13 LAB — COMPREHENSIVE METABOLIC PANEL
ALBUMIN: 4.2 g/dL (ref 3.5–5.0)
ALK PHOS: 63 U/L (ref 38–126)
ALT: 17 U/L (ref 0–44)
AST: 20 U/L (ref 15–41)
Anion gap: 7 (ref 5–15)
BUN: 14 mg/dL (ref 8–23)
CHLORIDE: 95 mmol/L — AB (ref 98–111)
CO2: 29 mmol/L (ref 22–32)
Calcium: 9.2 mg/dL (ref 8.9–10.3)
Creatinine, Ser: 1.04 mg/dL (ref 0.61–1.24)
Glucose, Bld: 109 mg/dL — ABNORMAL HIGH (ref 70–99)
POTASSIUM: 4.6 mmol/L (ref 3.5–5.1)
SODIUM: 131 mmol/L — AB (ref 135–145)
TOTAL PROTEIN: 6.3 g/dL — AB (ref 6.5–8.1)
Total Bilirubin: 1.1 mg/dL (ref 0.3–1.2)

## 2017-09-14 LAB — CANCER ANTIGEN 19-9: CA 19-9: 41 U/mL — ABNORMAL HIGH (ref 0–35)

## 2017-09-15 ENCOUNTER — Telehealth: Payer: Self-pay

## 2017-09-15 ENCOUNTER — Ambulatory Visit (INDEPENDENT_AMBULATORY_CARE_PROVIDER_SITE_OTHER): Payer: PPO

## 2017-09-15 DIAGNOSIS — R7989 Other specified abnormal findings of blood chemistry: Secondary | ICD-10-CM | POA: Diagnosis not present

## 2017-09-15 DIAGNOSIS — E538 Deficiency of other specified B group vitamins: Secondary | ICD-10-CM

## 2017-09-15 MED ORDER — CYANOCOBALAMIN 1000 MCG/ML IJ SOLN
1000.0000 ug | Freq: Once | INTRAMUSCULAR | Status: AC
  Administered 2017-09-15: 1000 ug via INTRAMUSCULAR

## 2017-09-15 MED ORDER — TESTOSTERONE CYPIONATE 200 MG/ML IM SOLN
50.0000 mg | INTRAMUSCULAR | Status: DC
  Administered 2017-09-15 – 2017-11-24 (×4): 50 mg via INTRAMUSCULAR

## 2017-09-15 NOTE — Telephone Encounter (Signed)
Copied from [place] [id number]. Topic: General - Call Back - No Documentation >> Sep 15, 2017  4:40 PM Marin Olp L wrote: Reason for CRM: Missed call no doc

## 2017-09-15 NOTE — Progress Notes (Signed)
Pt presents today for both b12 and testosterone injection. B12 given in right deltoid. Testosterone given in left upper outer quadrant. Pt did not show any signs of distress during injection or voice any concerns.

## 2017-09-16 ENCOUNTER — Other Ambulatory Visit: Payer: Self-pay

## 2017-09-16 ENCOUNTER — Encounter: Payer: Self-pay | Admitting: Oncology

## 2017-09-16 ENCOUNTER — Inpatient Hospital Stay (HOSPITAL_BASED_OUTPATIENT_CLINIC_OR_DEPARTMENT_OTHER): Payer: PPO | Admitting: Oncology

## 2017-09-16 VITALS — BP 167/61 | HR 65 | Temp 97.1°F | Resp 20 | Ht 68.0 in | Wt 149.0 lb

## 2017-09-16 DIAGNOSIS — I10 Essential (primary) hypertension: Secondary | ICD-10-CM

## 2017-09-16 DIAGNOSIS — R5381 Other malaise: Secondary | ICD-10-CM

## 2017-09-16 DIAGNOSIS — Z8673 Personal history of transient ischemic attack (TIA), and cerebral infarction without residual deficits: Secondary | ICD-10-CM

## 2017-09-16 DIAGNOSIS — Z7982 Long term (current) use of aspirin: Secondary | ICD-10-CM

## 2017-09-16 DIAGNOSIS — M810 Age-related osteoporosis without current pathological fracture: Secondary | ICD-10-CM | POA: Diagnosis not present

## 2017-09-16 DIAGNOSIS — R531 Weakness: Secondary | ICD-10-CM

## 2017-09-16 DIAGNOSIS — D473 Essential (hemorrhagic) thrombocythemia: Secondary | ICD-10-CM | POA: Diagnosis not present

## 2017-09-16 DIAGNOSIS — R5383 Other fatigue: Secondary | ICD-10-CM

## 2017-09-16 DIAGNOSIS — E785 Hyperlipidemia, unspecified: Secondary | ICD-10-CM

## 2017-09-16 DIAGNOSIS — Z800 Family history of malignant neoplasm of digestive organs: Secondary | ICD-10-CM | POA: Diagnosis not present

## 2017-09-16 DIAGNOSIS — Z79899 Other long term (current) drug therapy: Secondary | ICD-10-CM | POA: Diagnosis not present

## 2017-09-16 DIAGNOSIS — K869 Disease of pancreas, unspecified: Secondary | ICD-10-CM

## 2017-09-16 DIAGNOSIS — K862 Cyst of pancreas: Secondary | ICD-10-CM

## 2017-09-16 NOTE — Progress Notes (Signed)
[facility] OFFICE PROGRESS NOTE  Patient Care Team: Crecencio Mc, MD as PCP - General (Internal Medicine) Bary Castilla Forest Gleason, MD as Consulting Physician (General Surgery) Crecencio Mc, MD as Referring Physician (Internal Medicine) Leia Alf, MD (Inactive) as Medical Oncologist (Internal Medicine)   SUMMARY OF HEMATOLOGIC HISTORY: Oncology History   # 2008- ESSENTIAL THROMBOCYTOSIS- Hydrea 534m/d; NOV 2008- BMBx- slight increase in enlarged megakayocytes; bcr-abl-Neg;Jak-2 NEG; AUG 2017- CALR/MPL-NEG   # March 2018- Pancreatic head mass/ incidental- 1.6 cm mass. MRI- not suspicious of malignant potential at this time. Monitor in 1 year  # previous TIA      [place]    INTERVAL HISTORY:  Patient presents with above history of essential thrombocytosis here for follow-up.   Patient currently is tolerating Hydrea 500 mg every other day.   Patient denies any nausea, vomiting, changes in appetite, abdominal pain, recent blood clots or strokes or TIAs.  He denies any burning, pain or tingling in his extremities.  REVIEW OF SYSTEMS:   Review of Systems  Constitutional: Positive for malaise/fatigue. Negative for chills, fever and weight loss.  HENT: Negative for congestion and ear pain.   Eyes: Negative.  Negative for blurred vision and double vision.  Respiratory: Negative.  Negative for cough, sputum production and shortness of breath.   Cardiovascular: Negative.  Negative for chest pain, palpitations and leg swelling.  Gastrointestinal: Negative.  Negative for abdominal pain, constipation, diarrhea, nausea and vomiting.  Genitourinary: Negative for dysuria, frequency and urgency.  Musculoskeletal: Negative for back pain and falls.  Skin: Negative.  Negative for rash.  Neurological: Positive for weakness. Negative for headaches.  Endo/Heme/Allergies: Negative.  Does not bruise/bleed easily.  Psychiatric/Behavioral: Negative.   Negative for depression. The patient is not nervous/anxious and does not have insomnia.     PAST MEDICAL HISTORY :  Past Medical History:  Diagnosis Date  . Colon polyps   . [place]   . Essential thrombocytosis ([place]   . Hyperlipidemia   . Hypertension   . Lung nodule 12/2004   found on CXR  . Osteoporosis     PAST SURGICAL HISTORY :   Past Surgical History:  Procedure Laterality Date  . BONE MARROW BIOPSY  01/18/07  . COLONOSCOPY  2007, 2010   Dr WAllen Norris . NASAL SINUS SURGERY  06/11/05    FAMILY HISTORY :   Family History  Problem Relation Age of Onset  . Colon cancer Mother        colon age 48376's . Peripheral vascular disease Father   . Colon cancer Brother        colon age 488's . Diabetes Unknown   . Prostate cancer Neg Hx   . Bladder Cancer Neg Hx   . Kidney cancer Neg Hx     SOCIAL HISTORY:   Social History   Tobacco Use  . Smoking status: Never Smoker  . Smokeless tobacco: Never Used  Substance Use Topics  . Alcohol use: No  . Drug use: No    ALLERGIES:  is allergic to amlodipine; atenolol; erythromycin; furosemide; hctz [hydrochlorothiazide]; hydrochlorothiazide w-triamterene; levofloxacin; metoprolol; micardis [telmisartan]; penicillins; terazosin; and triamterene-hctz.  MEDICATIONS:  Current Outpatient Medications  Medication Sig Dispense Refill  . albuterol (PROVENTIL HFA;VENTOLIN HFA) 108 (90 Base) MCG/ACT inhaler Inhale 2 puffs into the lungs every 6 (six) hours as needed for wheezing or shortness of breath. 1 Inhaler 2  . amLODipine (NORVASC) 5 MG tablet Take 1 tablet (5  mg total) by mouth daily. 90 tablet 1  . aspirin EC 81 MG tablet Take 81 mg by mouth daily. Reported on 08/14/2015    . Calcium Citrate-Vitamin D (CITRACAL + D PO) Take 3 tablets by mouth daily. Take 2 by mouth daily, calcium 630 and vitamin D 500 each.    . Cholecalciferol (VITAMIN D3) 1000 UNITS CAPS Take 2,000 Units by mouth daily. Take 2 by mouth daily     . clotrimazole-betamethasone (LOTRISONE) cream     . Cyanocobalamin (B-12 COMPLIANCE INJECTION) 1000 MCG/ML KIT Inject as directed.    . docusate sodium (COLACE) 100 MG capsule Take 100 mg by mouth 2 (two) times daily as needed for mild constipation.    [name] guaiFENesin 200 MG tablet Take 2 tablets (400 mg total) by mouth every 6 (six) hours as needed for cough or to loosen phlegm. 30 tablet 0  . hydrALAZINE (APRESOLINE) 50 MG tablet Take 1 tablet (50 mg total) by mouth 3 (three) times daily. 270 tablet 1  . hydroxyurea (HYDREA) 500 MG capsule Take 1 pill every other day . may take with food to minimize GI side effects. 60 capsule 3  . lisinopril (PRINIVIL,ZESTRIL) 40 MG tablet TAKE 1 TABLET BY MOUTH ONCE DAILY. 90 tablet 1  . fluticasone (FLONASE) 50 MCG/ACT nasal spray Place 1 spray into both nostrils daily.      Current Facility-Administered Medications  Medication Dose Route Frequency Provider Last Rate Last Dose  . testosterone cypionate (DEPOTESTOSTERONE CYPIONATE) injection 50 mg  50 mg Intramuscular Q14 Days Crecencio Mc, MD   50 mg at 07/07/17 1114  . testosterone cypionate (DEPOTESTOSTERONE CYPIONATE) injection 50 mg  50 mg Intramuscular Q14 Days Crecencio Mc, MD   50 mg at 06/23/17 0947  . testosterone cypionate (DEPOTESTOSTERONE CYPIONATE) injection 50 mg  50 mg Intramuscular Once Crecencio Mc, MD      . testosterone cypionate (DEPOTESTOSTERONE CYPIONATE) injection 50 mg  50 mg Intramuscular Q14 Days Crecencio Mc, MD   50 mg at 09/15/17 1015    PHYSICAL EXAMINATION:   BP (!) 167/61 (BP Location: Left Arm, Patient Position: Sitting)   Pulse 65   Temp (!) 97.1 F (36.2 C) (Tympanic)   Resp 20   Ht 5' 8"  (1.727 m)   Wt 149 lb (67.6 kg)   BMI 22.66 kg/m   Filed Weights   09/16/17 1408  Weight: 149 lb (67.6 kg)    Physical Exam  Constitutional: He is oriented to person, place, and time and well-developed, well-nourished, and in no distress. Vital signs are  normal.  HENT:  Head: Normocephalic and atraumatic.  Eyes: Pupils are equal, round, and reactive to light.  Neck: Normal range of motion.  Cardiovascular: Normal rate, regular rhythm and normal heart sounds.  No murmur heard. Pulmonary/Chest: Effort normal and breath sounds normal. He has no wheezes.  Abdominal: Soft. Normal appearance and bowel sounds are normal. He exhibits no distension. There is no tenderness.  Musculoskeletal: Normal range of motion. He exhibits no edema.  Neurological: He is alert and oriented to person, place, and time. Gait normal.  Skin: Skin is warm and dry. No rash noted.  Psychiatric: Mood, memory, affect and judgment normal.  '; LABORATORY DATA:  I have reviewed the data as listed    Component Value Date/Time   NA 131 (L) 09/13/2017 1416   NA 136 05/19/2011 1049   K 4.6 09/13/2017 1416   K 4.3 05/19/2011 1049   CL 95 (L)  09/13/2017 1416   CL 99 05/19/2011 1049   CO2 29 09/13/2017 1416   CO2 32 05/19/2011 1049   GLUCOSE 109 (H) 09/13/2017 1416   GLUCOSE 116 (H) 05/19/2011 1049   BUN 14 09/13/2017 1416   BUN 8 05/19/2011 1049   CREATININE 1.04 09/13/2017 1416   CREATININE 1.15 12/24/2013 0921   CALCIUM 9.2 09/13/2017 1416   CALCIUM 9.0 05/19/2011 1049   PROT 6.3 (L) 09/13/2017 1416   PROT 6.8 12/24/2013 0921   ALBUMIN 4.2 09/13/2017 1416   ALBUMIN 4.0 12/24/2013 0921   AST 20 09/13/2017 1416   AST 23 12/24/2013 0921   ALT 17 09/13/2017 1416   ALT 19 12/24/2013 0921   ALKPHOS 63 09/13/2017 1416   ALKPHOS 57 12/24/2013 0921   BILITOT 1.1 09/13/2017 1416   BILITOT 0.9 12/24/2013 0921   GFRNONAA >60 09/13/2017 1416   GFRNONAA >60 12/24/2013 0921   GFRNONAA 50 (L) 09/03/2013 0930   GFRAA >60 09/13/2017 1416   GFRAA >60 12/24/2013 0921   GFRAA 58 (L) 09/03/2013 0930    No results found for: SPEP, UPEP  Lab Results  Component Value Date   WBC 11.5 (H) 09/13/2017   NEUTROABS 8.7 (H) 09/13/2017   HGB 13.6 09/13/2017   HCT 40.1 09/13/2017    MCV 99.4 09/13/2017   PLT 437 09/13/2017      Chemistry      Component Value Date/Time   NA 131 (L) 09/13/2017 1416   NA 136 05/19/2011 1049   K 4.6 09/13/2017 1416   K 4.3 05/19/2011 1049   CL 95 (L) 09/13/2017 1416   CL 99 05/19/2011 1049   CO2 29 09/13/2017 1416   CO2 32 05/19/2011 1049   BUN 14 09/13/2017 1416   BUN 8 05/19/2011 1049   CREATININE 1.04 09/13/2017 1416   CREATININE 1.15 12/24/2013 0921      Component Value Date/Time   CALCIUM 9.2 09/13/2017 1416   CALCIUM 9.0 05/19/2011 1049   ALKPHOS 63 09/13/2017 1416   ALKPHOS 57 12/24/2013 0921   AST 20 09/13/2017 1416   AST 23 12/24/2013 0921   ALT 17 09/13/2017 1416   ALT 19 12/24/2013 0921   BILITOT 1.1 09/13/2017 1416   BILITOT 0.9 12/24/2013 0921       RADIOGRAPHIC STUDIES: I have personally reviewed the radiological images as listed and agreed with the findings in the report. No results found.   ASSESSMENT & PLAN:   Essential thrombocytosis ([place]) Pancreatic cyst; Imaging from April 2019 showing slightly more solid 2.5 cm mass compared to imaging in July 2018.  Continues to be asymptomatic.    Recommendation was to follow-up with imaging in 6 months.  We will get this scheduled for October 2019 prior to next follow-up with Dr. Rogue Bussing.  CA-19-9 continues to be elevated at 41 (April CA 19-9 was 41). Stable.  History of essential thrombocytosis [NEG jak/MLP/CALR]- off Hydrea is March 2018.  CBC is within normal limits.  Platelets 437.  Continue Hydrea 500 mg every other day.  Elevated Blood pressure- 962- systolic. Better at home [130-140s] as per pt. Defer to PCP.  labs in 3 months- cbc/cmp/ca-19-9 few days prior.  Per Dr. Rogue Bussing will get repeat MRI of abdomen prior to follow-up.       Jacquelin Hawking, NP 09/20/2017 1:19 PM

## 2017-09-16 NOTE — Assessment & Plan Note (Addendum)
Pancreatic cyst; Imaging from April 2019 showing slightly more solid 2.5 cm mass compared to imaging in July 2018.  Continues to be asymptomatic.    Recommendation was to follow-up with imaging in 6 months.  We will get this scheduled for October 2019 prior to next follow-up with Dr. Rogue Bussing.  CA-19-9 continues to be elevated at 41 (April CA 19-9 was 41). Stable.  History of essential thrombocytosis [NEG jak/MLP/CALR]- off Hydrea is March 2018.  CBC is within normal limits.  Platelets 437.  Continue Hydrea 500 mg every other day.  Elevated Blood pressure- 887- systolic. Better at home [130-140s] as per pt. Defer to PCP.  labs in 3 months- cbc/cmp/ca-19-9 few days prior.  Per Dr. Rogue Bussing will get repeat MRI of abdomen prior to follow-up.

## 2017-09-16 NOTE — Telephone Encounter (Signed)
Spoke with pt's wife to get the lot number and exp date of the testosterone that was given to the pt yesterday.

## 2017-09-18 NOTE — Progress Notes (Signed)
  I have reviewed the above information and agree with above.   Shade Rivenbark, MD 

## 2017-09-19 DIAGNOSIS — J22 Unspecified acute lower respiratory infection: Secondary | ICD-10-CM | POA: Diagnosis not present

## 2017-09-19 DIAGNOSIS — J40 Bronchitis, not specified as acute or chronic: Secondary | ICD-10-CM | POA: Diagnosis not present

## 2017-09-19 DIAGNOSIS — J208 Acute bronchitis due to other specified organisms: Secondary | ICD-10-CM | POA: Diagnosis not present

## 2017-09-29 ENCOUNTER — Ambulatory Visit (INDEPENDENT_AMBULATORY_CARE_PROVIDER_SITE_OTHER): Payer: PPO

## 2017-09-29 DIAGNOSIS — R7989 Other specified abnormal findings of blood chemistry: Secondary | ICD-10-CM

## 2017-09-29 NOTE — Progress Notes (Signed)
Patient comes in today for a testosterone injection. Administered IM in upper left gluteal.Patient tolerated well.

## 2017-10-04 ENCOUNTER — Other Ambulatory Visit: Payer: Self-pay | Admitting: Internal Medicine

## 2017-10-05 NOTE — Telephone Encounter (Signed)
Refilled: 08/31/2017 Last OV: 08/31/2017 Next OV: not scheduled

## 2017-10-13 ENCOUNTER — Ambulatory Visit (INDEPENDENT_AMBULATORY_CARE_PROVIDER_SITE_OTHER): Payer: PPO

## 2017-10-13 DIAGNOSIS — R7989 Other specified abnormal findings of blood chemistry: Secondary | ICD-10-CM

## 2017-10-13 DIAGNOSIS — E538 Deficiency of other specified B group vitamins: Secondary | ICD-10-CM | POA: Diagnosis not present

## 2017-10-13 MED ORDER — CYANOCOBALAMIN 1000 MCG/ML IJ SOLN
1000.0000 ug | Freq: Once | INTRAMUSCULAR | Status: AC
  Administered 2017-10-13: 1000 ug via INTRAMUSCULAR

## 2017-10-13 NOTE — Progress Notes (Addendum)
Patient presented for B 12 injection to right deltoid and Testosterone injection to right upper glutea. Patient voiced no concerns nor showed any signs of distress during injection.    I have reviewed the above information and agree with above.   Deborra Medina, MD

## 2017-10-27 ENCOUNTER — Ambulatory Visit (INDEPENDENT_AMBULATORY_CARE_PROVIDER_SITE_OTHER): Payer: PPO

## 2017-10-27 DIAGNOSIS — R7989 Other specified abnormal findings of blood chemistry: Secondary | ICD-10-CM | POA: Diagnosis not present

## 2017-10-27 MED ORDER — TESTOSTERONE CYPIONATE 200 MG/ML IM SOLN
50.0000 mg | INTRAMUSCULAR | Status: DC
  Administered 2017-10-27 – 2017-12-08 (×4): 50 mg via INTRAMUSCULAR

## 2017-10-27 NOTE — Progress Notes (Signed)
Patient comes in  For testosterone injection. Injected left upper quadrant.   Patient tolerated injection well.

## 2017-11-01 NOTE — Progress Notes (Signed)
  I have reviewed the above information and agree with above.   Kaoir Loree, MD 

## 2017-11-03 DIAGNOSIS — R0981 Nasal congestion: Secondary | ICD-10-CM | POA: Diagnosis not present

## 2017-11-10 ENCOUNTER — Ambulatory Visit (INDEPENDENT_AMBULATORY_CARE_PROVIDER_SITE_OTHER): Payer: PPO

## 2017-11-10 DIAGNOSIS — E538 Deficiency of other specified B group vitamins: Secondary | ICD-10-CM | POA: Diagnosis not present

## 2017-11-10 DIAGNOSIS — R7989 Other specified abnormal findings of blood chemistry: Secondary | ICD-10-CM

## 2017-11-10 MED ORDER — TESTOSTERONE CYPIONATE 100 MG/ML IM SOLN
50.0000 mg | INTRAMUSCULAR | Status: DC
  Administered 2018-03-30: 50 mg via INTRAMUSCULAR

## 2017-11-10 MED ORDER — CYANOCOBALAMIN 1000 MCG/ML IJ SOLN
1000.0000 ug | Freq: Once | INTRAMUSCULAR | Status: AC
  Administered 2017-11-10: 1000 ug via INTRAMUSCULAR

## 2017-11-10 NOTE — Progress Notes (Signed)
Patient comes in for B 12  and Testerone injection.  Administered B12 into right deltoid. Testerone was administered into right upper outer quadrant.   Patient tolerated injections well.

## 2017-11-13 NOTE — Progress Notes (Signed)
  I have reviewed the above information and agree with above.   Katelyn Kohlmeyer, MD 

## 2017-11-18 ENCOUNTER — Ambulatory Visit (INDEPENDENT_AMBULATORY_CARE_PROVIDER_SITE_OTHER): Payer: PPO | Admitting: Internal Medicine

## 2017-11-18 ENCOUNTER — Encounter: Payer: Self-pay | Admitting: Internal Medicine

## 2017-11-18 DIAGNOSIS — J310 Chronic rhinitis: Secondary | ICD-10-CM

## 2017-11-18 DIAGNOSIS — I10 Essential (primary) hypertension: Secondary | ICD-10-CM | POA: Diagnosis not present

## 2017-11-18 MED ORDER — FUROSEMIDE 20 MG PO TABS: ORAL_TABLET | ORAL | 0 refills | Status: DC

## 2017-11-18 NOTE — Assessment & Plan Note (Signed)
adding furosemide every other day for fluid retention.  Continue taking amlodipine 5 mg  Daily,   Lisinopril 40 mg daily ,  And hydralazine 50 mg three times daily .  Patient  to return next Thursday for BP check, to be done during RN visit for testosterone injection and flu vaccination

## 2017-11-18 NOTE — Patient Instructions (Addendum)
You do not have a cold.  Colds don't last for 3 weeks, and they have more symptoms than just a runny nose   I am recommending  1) a one week trial of diphenhydramine (generic benadryl) 12.5  Mg (children's dose)  to 25  mg (adult dose ) one hour before bedtime, along with claritin once daily in the morning . These are both  available generically as loratidine  2) if the runny nose is not better with this regimen  Please call the office and I will call in another medication to try      For your blood pressure:  Continue amlodipine because we have NO OTHER OPTIONS DUE TO YOUR ALLERGIES TO OTHER MEDICATIONS  I am adding furosemide ,  A fluid pill,  To take [place]

## 2017-11-18 NOTE — Progress Notes (Signed)
Subjective:  Patient ID: Jimmy Jimenez, male    DOB: 03/13/1925  Age: 84 y.o. MRN: [id number]  CC: Diagnoses of Essential hypertension and Rhinitis, chronic were pertinent to this visit.  HPI [name] presents for evaluation and treatment of rhinitis. Symptoms have  Been accompanied by congestion and have been  present for 3 to 4 weeks , without sinus pain,, ear pain,  cough,  Body aches, or fever.  Denies excessve sneezing, but does sneeze daily. .  Patient has tried multiple OTC products, including Afrin ,  WHICH DId NOT HELP THE Congestion so he switched TO Flonase  For 2 weeks With no change in symptoms.  Drainage is clear and more copious at night and in the morning, but does not resolve during the day .   Does not work outside in yard.  No house pets. Does not smoke. No history of head injury.   HTN: patient requesting an alternative to amlodipine to management due to mild ankle edema . He has a history of hyponatremia with thiazide diuretics and multiple other intolerances to anti hypertensive medications. His history of dizziness with loop diuretics occurred with daily use.    Outpatient Medications Prior to Visit  Medication Sig Dispense Refill  . amLODipine (NORVASC) 5 MG tablet Take 1 tablet (5 mg total) by mouth daily. 90 tablet 1  . aspirin EC 81 MG tablet Take 81 mg by mouth daily. Reported on 08/14/2015    . Calcium Citrate-Vitamin D (CITRACAL + D PO) Take 3 tablets by mouth daily. Take 2 by mouth daily, calcium 630 and vitamin D 500 each.    . Cholecalciferol (VITAMIN D3) 1000 UNITS CAPS Take 2,000 Units by mouth daily. Take 2 by mouth daily    . clotrimazole-betamethasone (LOTRISONE) cream     . Cyanocobalamin (B-12 COMPLIANCE INJECTION) 1000 MCG/ML KIT Inject as directed.    . docusate sodium (COLACE) 100 MG capsule Take 100 mg by mouth 2 (two) times daily as needed for mild constipation.    . fluticasone (FLONASE) 50 MCG/ACT nasal spray Place 1 spray into both nostrils  daily.     . hydrALAZINE (APRESOLINE) 50 MG tablet Take 1 tablet (50 mg total) by mouth 3 (three) times daily. 270 tablet 1  . hydroxyurea (HYDREA) 500 MG capsule Take 1 pill every other day . may take with food to minimize GI side effects. 60 capsule 3  . lisinopril (PRINIVIL,ZESTRIL) 40 MG tablet TAKE 1 TABLET BY MOUTH ONCE DAILY. 90 tablet 1  . testosterone cypionate (DEPOTESTOSTERONE CYPIONATE) 200 MG/ML injection INJECT 0.25 MLS (50MG) INTRAMUSCULARLY EVERY 14 DAYS 10 mL 3  . albuterol (PROVENTIL HFA;VENTOLIN HFA) 108 (90 Base) MCG/ACT inhaler Inhale 2 puffs into the lungs every 6 (six) hours as needed for wheezing or shortness of breath. (Patient not taking: Reported on 11/18/2017) 1 Inhaler 2  . guaiFENesin 200 MG tablet Take 2 tablets (400 mg total) by mouth every 6 (six) hours as needed for cough or to loosen phlegm. (Patient not taking: Reported on 11/18/2017) 30 tablet 0   Facility-Administered Medications Prior to Visit  Medication Dose Route Frequency Provider Last Rate Last Dose  . testosterone cypionate (DEPOTESTOSTERONE CYPIONATE) injection 50 mg  50 mg Intramuscular Q14 Days Crecencio Mc, MD   50 mg at 07/07/17 1114  . testosterone cypionate (DEPOTESTOSTERONE CYPIONATE) injection 50 mg  50 mg Intramuscular Q14 Days Crecencio Mc, MD   50 mg at 06/23/17 0947  . testosterone cypionate (DEPOTESTOSTERONE CYPIONATE) injection 50  mg  50 mg Intramuscular Once Crecencio Mc, MD      . testosterone cypionate (DEPOTESTOSTERONE CYPIONATE) injection 50 mg  50 mg Intramuscular Q14 Days Crecencio Mc, MD   50 mg at 10/13/17 1027  . testosterone cypionate (DEPOTESTOSTERONE CYPIONATE) injection 50 mg  50 mg Intramuscular Q14 Days Crecencio Mc, MD   50 mg at 11/10/17 1140  . testosterone cypionate (DEPOTESTOTERONE CYPIONATE) injection 50 mg  50 mg Intramuscular Q14 Days Crecencio Mc, MD        Review of Systems;  Patient denies headache, fevers, malaise, unintentional weight loss,  skin rash, eye pain, sinus congestion and sinus pain, sore throat, dysphagia,  hemoptysis , cough, dyspnea, wheezing, chest pain, palpitations, orthopnea, edema, abdominal pain, nausea, melena, diarrhea, constipation, flank pain, dysuria, hematuria, urinary  Frequency, nocturia, numbness, tingling, seizures,  Focal weakness, Loss of consciousness,  Tremor, insomnia, depression, anxiety, and suicidal ideation.      Objective:  BP (!) 184/64 (BP Location: Left Arm, Patient Position: Sitting, Cuff Size: Normal)   Pulse 69   Temp 98.7 F (37.1 C) (Oral)   Resp 15   Ht 5' 8" (1.727 m)   Wt 150 lb 9.6 oz (68.3 kg)   SpO2 94%   BMI 22.90 kg/m   BP Readings from Last 3 Encounters:  11/18/17 (!) 184/64  09/16/17 (!) 167/61  08/31/17 (!) 148/66    Wt Readings from Last 3 Encounters:  11/18/17 150 lb 9.6 oz (68.3 kg)  09/16/17 149 lb (67.6 kg)  08/31/17 151 lb 9.6 oz (68.8 kg)    General appearance: alert, cooperative and appears stated age Ears: normal TM's and external ear canals both ears Throat: lips, mucosa, and tongue normal; teeth and gums normal Neck: no adenopathy, no carotid bruit, supple, symmetrical, trachea midline and thyroid not enlarged, symmetric, no tenderness/mass/nodules Back: symmetric, no curvature. ROM normal. No CVA tenderness. Lungs: clear to auscultation bilaterally Heart: regular rate and rhythm, S1, S2 normal, no murmur, click, rub or gallop Abdomen: soft, non-tender; bowel sounds normal; no masses,  no organomegaly Pulses: 2+ and symmetric Skin: Skin color, texture, turgor normal. No rashes or lesions Lymph nodes: Cervical, supraclavicular, and axillary nodes normal.  No results found for: HGBA1C  Lab Results  Component Value Date   CREATININE 1.04 09/13/2017   CREATININE 0.84 08/26/2017   CREATININE 1.10 06/14/2017    Lab Results  Component Value Date   WBC 11.5 (H) 09/13/2017   HGB 13.6 09/13/2017   HCT 40.1 09/13/2017   PLT 437 09/13/2017    GLUCOSE 109 (H) 09/13/2017   LDLDIRECT 103.7 09/26/2013   ALT 17 09/13/2017   AST 20 09/13/2017   NA 131 (L) 09/13/2017   K 4.6 09/13/2017   CL 95 (L) 09/13/2017   CREATININE 1.04 09/13/2017   BUN 14 09/13/2017   CO2 29 09/13/2017   TSH 1.95 08/14/2015   PSA 0.88 08/19/2014   INR 1.02 08/31/2016   MICROALBUR 0.7 08/30/2012    Dg Chest 2 View  Result Date: 08/26/2017 CLINICAL DATA:  Chest pain and shortness of breath. EXAM: CHEST - 2 VIEW COMPARISON:  CT chest dated April 20, 2016. Chest x-ray dated August 06, 2015. FINDINGS: The heart size and mediastinal contours are within normal limits. Normal pulmonary vascularity. Atherosclerotic calcification of the aortic arch. Unchanged lower lobe predominant peripheral interstitial thickening, consistent with chronic interstitial lung disease. No focal consolidation, pleural effusion, or pneumothorax. No acute osseous abnormality. IMPRESSION: 1.  No active cardiopulmonary  disease. 2. Chronic interstitial lung disease, grossly unchanged. Electronically Signed   By: Titus Dubin M.D.   On: 08/26/2017 08:24    Assessment & Plan:   Problem List Items Addressed This Visit    Hypertension    adding furosemide every other day for fluid retention.  Continue taking amlodipine 5 mg  Daily,   Lisinopril 40 mg daily ,  And hydralazine 50 mg three times daily .  Patient  to return next Thursday for BP check, to be done during RN visit for testosterone injection and flu vaccination       Relevant Medications   furosemide (LASIX) 20 MG tablet   Rhinitis, chronic    Trial of benadryl at bedtime and claritin during the day for one week.  Has already tried flonase and Afrin sequentially        A total of 25 minutes of face to face time was spent with patient more than half of which was spent in counselling about the above mentioned conditions  and coordination of care   I have discontinued Arnell Sieving Bickhart's albuterol and guaiFENesin. I am also having him  start on furosemide. Additionally, I am having him maintain his Calcium Citrate-Vitamin D (CITRACAL + D PO), Vitamin D3, aspirin EC, fluticasone, docusate sodium, Cyanocobalamin, hydroxyurea, clotrimazole-betamethasone, amLODipine, hydrALAZINE, lisinopril, and testosterone cypionate. We will continue to administer testosterone cypionate, testosterone cypionate, testosterone cypionate, testosterone cypionate, testosterone cypionate, and testosterone cypionate.  Meds ordered this encounter  Medications  . furosemide (LASIX) 20 MG tablet    Sig: One tablet every other day in the morning    Dispense:  45 tablet    Refill:  0    Medications Discontinued During This Encounter  Medication Reason  . albuterol (PROVENTIL HFA;VENTOLIN HFA) 108 (90 Base) MCG/ACT inhaler Patient has not taken in last 30 days  . guaiFENesin 200 MG tablet Patient has not taken in last 30 days    Follow-up: No follow-ups on file.   Crecencio Mc, MD

## 2017-11-20 DIAGNOSIS — J310 Chronic rhinitis: Secondary | ICD-10-CM | POA: Insufficient documentation

## 2017-11-20 NOTE — Assessment & Plan Note (Signed)
Trial of benadryl at bedtime and claritin during the day for one week.  Has already tried flonase and Afrin sequentially

## 2017-11-24 ENCOUNTER — Ambulatory Visit (INDEPENDENT_AMBULATORY_CARE_PROVIDER_SITE_OTHER): Payer: PPO

## 2017-11-24 DIAGNOSIS — R7989 Other specified abnormal findings of blood chemistry: Secondary | ICD-10-CM

## 2017-11-24 DIAGNOSIS — I10 Essential (primary) hypertension: Secondary | ICD-10-CM

## 2017-11-24 MED ORDER — TESTOSTERONE CYPIONATE 100 MG/ML IM SOLN: 50.0000 mg | Freq: Once | INTRAMUSCULAR | Status: DC

## 2017-11-24 MED ORDER — TESTOSTERONE CYPIONATE 100 MG/ML IM SOLN: 100.0000 mg | Freq: Once | INTRAMUSCULAR | Status: DC

## 2017-11-24 MED ORDER — TESTOSTERONE ENANTHATE 200 MG/ML IM SOLN: 50.0000 mg | Freq: Once | INTRAMUSCULAR | Status: DC

## 2017-11-24 MED ORDER — TESTOSTERONE CYPIONATE 100 MG/ML IM SOLN: 100.0000 mg | INTRAMUSCULAR | Status: DC

## 2017-11-24 MED ORDER — TESTOSTERONE CYPIONATE 200 MG/ML IM SOLN: 50.0000 mg | Freq: Once | INTRAMUSCULAR | Status: DC

## 2017-11-24 NOTE — Progress Notes (Signed)
Patient comes in for two week blood pressure . Patient is currently taking furosemide every other day, amlodipine 5 mg daily and lisinopril 40 mg daily.  Blood pressure obtained in left arm 160/67 pulse 67 O2 97%.  Flu shot administered in left arm and testosterone shot given in left upper outer quadrant.   Patient tolerated injections well.

## 2017-11-30 ENCOUNTER — Telehealth: Payer: Self-pay | Admitting: Internal Medicine

## 2017-11-30 MED ORDER — IPRATROPIUM BROMIDE 0.03 % NA SOLN: 2.0000 | Freq: Two times a day (BID) | NASAL | 0 refills | Status: DC

## 2017-11-30 NOTE — Telephone Encounter (Signed)
atrovent nasal spray sent to pharmacy. 2 squirts in each nostril twice daily

## 2017-11-30 NOTE — Telephone Encounter (Signed)
Copied from [facility] [id number]. Topic: Quick Communication - See Telephone Encounter >> Nov 30, 2017 10:10 AM Bea Graff, NT wrote: CRM for notification. See Telephone encounter for: 11/30/17. Pts spouse, [name] Huckaba calling and states pts nose is still running and that the benadryl and Claritin is not working. She states they were to call back if this did not work and Dr. Derrel Nip would order something. Please advise.

## 2017-11-30 NOTE — Telephone Encounter (Signed)
rx request , patient still not feeling well.

## 2017-12-01 NOTE — Telephone Encounter (Signed)
Spoke with pt's wife and let her know that Dr. Derrel Nip has sent in a nasal spray called Atrovent for the pt to use. Explained the directions of use the wife. Pt's wife gave a verbal understanding.

## 2017-12-08 ENCOUNTER — Ambulatory Visit (INDEPENDENT_AMBULATORY_CARE_PROVIDER_SITE_OTHER): Payer: PPO

## 2017-12-08 DIAGNOSIS — E349 Endocrine disorder, unspecified: Secondary | ICD-10-CM

## 2017-12-08 DIAGNOSIS — E538 Deficiency of other specified B group vitamins: Secondary | ICD-10-CM | POA: Diagnosis not present

## 2017-12-08 DIAGNOSIS — R7989 Other specified abnormal findings of blood chemistry: Secondary | ICD-10-CM

## 2017-12-08 MED ORDER — CYANOCOBALAMIN 1000 MCG/ML IJ SOLN
1000.0000 ug | Freq: Once | INTRAMUSCULAR | Status: AC
  Administered 2017-12-08: 1000 ug via INTRAMUSCULAR

## 2017-12-08 NOTE — Progress Notes (Signed)
Pt was here today for a nurse visit for Testerone and B-12 injections.   Testerone was give in RUQ 0.25 Ml pt did bring in his own medication.   YPP:[id number] PYK#:[id number] Exp date: 06/2018  Manufacturer: [employer]   B-12 was give in LD.   Patient tolerated well.

## 2017-12-11 NOTE — Progress Notes (Signed)
  I have reviewed the above information and agree with above.   Anabia Weatherwax, MD 

## 2017-12-20 ENCOUNTER — Inpatient Hospital Stay: Payer: PPO | Attending: Internal Medicine

## 2017-12-20 ENCOUNTER — Ambulatory Visit
Admission: RE | Admit: 2017-12-20 | Discharge: 2017-12-20 | Disposition: A | Discharge status: Home / Self Care | Payer: PPO | Source: Ambulatory Visit | Attending: Oncology | Admitting: Oncology

## 2017-12-20 DIAGNOSIS — K862 Cyst of pancreas: Secondary | ICD-10-CM

## 2017-12-20 DIAGNOSIS — R630 Anorexia: Secondary | ICD-10-CM | POA: Insufficient documentation

## 2017-12-20 DIAGNOSIS — D1809 Hemangioma of other sites: Secondary | ICD-10-CM | POA: Diagnosis not present

## 2017-12-20 DIAGNOSIS — I10 Essential (primary) hypertension: Secondary | ICD-10-CM | POA: Diagnosis not present

## 2017-12-20 DIAGNOSIS — Z7982 Long term (current) use of aspirin: Secondary | ICD-10-CM | POA: Diagnosis not present

## 2017-12-20 DIAGNOSIS — R5381 Other malaise: Secondary | ICD-10-CM | POA: Diagnosis not present

## 2017-12-20 DIAGNOSIS — R5383 Other fatigue: Secondary | ICD-10-CM | POA: Diagnosis not present

## 2017-12-20 DIAGNOSIS — Z8673 Personal history of transient ischemic attack (TIA), and cerebral infarction without residual deficits: Secondary | ICD-10-CM | POA: Insufficient documentation

## 2017-12-20 DIAGNOSIS — D473 Essential (hemorrhagic) thrombocythemia: Secondary | ICD-10-CM | POA: Insufficient documentation

## 2017-12-20 DIAGNOSIS — E785 Hyperlipidemia, unspecified: Secondary | ICD-10-CM | POA: Insufficient documentation

## 2017-12-20 DIAGNOSIS — Z800 Family history of malignant neoplasm of digestive organs: Secondary | ICD-10-CM | POA: Insufficient documentation

## 2017-12-20 DIAGNOSIS — M810 Age-related osteoporosis without current pathological fracture: Secondary | ICD-10-CM | POA: Insufficient documentation

## 2017-12-20 DIAGNOSIS — Z79899 Other long term (current) drug therapy: Secondary | ICD-10-CM | POA: Insufficient documentation

## 2017-12-20 LAB — COMPREHENSIVE METABOLIC PANEL
ALBUMIN: 4.5 g/dL (ref 3.5–5.0)
ALK PHOS: 72 U/L (ref 38–126)
ALT: 14 U/L (ref 0–44)
ANION GAP: 6 (ref 5–15)
AST: 23 U/L (ref 15–41)
BUN: 14 mg/dL (ref 8–23)
CALCIUM: 9.9 mg/dL (ref 8.9–10.3)
CO2: 34 mmol/L — AB (ref 22–32)
Chloride: 98 mmol/L (ref 98–111)
Creatinine, Ser: 1.12 mg/dL (ref 0.61–1.24)
GFR calc non Af Amer: 56 mL/min — ABNORMAL LOW (ref >60)
GLUCOSE: 147 mg/dL — AB (ref 70–99)
Potassium: 4.4 mmol/L (ref 3.5–5.1)
SODIUM: 138 mmol/L (ref 135–145)
Total Bilirubin: 1.4 mg/dL — ABNORMAL HIGH (ref 0.3–1.2)
Total Protein: 7.1 g/dL (ref 6.5–8.1)

## 2017-12-20 LAB — CBC WITH DIFFERENTIAL/PLATELET
Abs Immature Granulocytes: 0.03 10*3/uL (ref 0.00–0.07)
BASOS ABS: 0.1 10*3/uL (ref 0.0–0.1)
Basophils Relative: 1 %
EOS ABS: 0.2 10*3/uL (ref 0.0–0.5)
EOS PCT: 2 %
HCT: 43.3 % (ref 39.0–52.0)
Hemoglobin: 14.5 g/dL (ref 13.0–17.0)
IMMATURE GRANULOCYTES: 0 %
LYMPHS ABS: 1.5 10*3/uL (ref 0.7–4.0)
Lymphocytes Relative: 19 %
MCH: 31.8 pg (ref 26.0–34.0)
MCHC: 33.5 g/dL (ref 30.0–36.0)
MCV: 95 fL (ref 80.0–100.0)
MONOS PCT: 9 %
Monocytes Absolute: 0.7 10*3/uL (ref 0.1–1.0)
NEUTROS PCT: 69 %
Neutro Abs: 5.6 10*3/uL (ref 1.7–7.7)
PLATELETS: 382 10*3/uL (ref 150–400)
RBC: 4.56 MIL/uL (ref 4.22–5.81)
RDW: 12.8 % (ref 11.5–15.5)
WBC: 8.1 10*3/uL (ref 4.0–10.5)
nRBC: 0 % (ref 0.0–0.2)

## 2017-12-20 MED ORDER — GADOBUTROL 1 MMOL/ML IV SOLN
7.0000 mL | Freq: Once | INTRAVENOUS | Status: AC | PRN
  Administered 2017-12-20: 7 mL via INTRAVENOUS

## 2017-12-21 LAB — CANCER ANTIGEN 19-9: CAN 19-9: 43 U/mL — AB (ref 0–35)

## 2017-12-22 ENCOUNTER — Ambulatory Visit (INDEPENDENT_AMBULATORY_CARE_PROVIDER_SITE_OTHER): Payer: PPO

## 2017-12-22 DIAGNOSIS — E291 Testicular hypofunction: Secondary | ICD-10-CM

## 2017-12-22 MED ORDER — TESTOSTERONE CYPIONATE 200 MG/ML IM SOLN
50.0000 mg | Freq: Once | INTRAMUSCULAR | Status: AC
  Administered 2017-12-22: 50 mg via INTRAMUSCULAR

## 2017-12-22 NOTE — Progress Notes (Signed)
Pt was here today for Testerone injection given LUQ pt brought in their own medication. Pt seem to tolerated well.   Lot #:[id number] Exp date: 05/20 LNZ:[id number]  Pt is to have 0.25 ML once every 14 days

## 2017-12-23 ENCOUNTER — Inpatient Hospital Stay (HOSPITAL_BASED_OUTPATIENT_CLINIC_OR_DEPARTMENT_OTHER): Payer: PPO | Admitting: Internal Medicine

## 2017-12-23 ENCOUNTER — Other Ambulatory Visit: Payer: Self-pay

## 2017-12-23 VITALS — BP 178/80 | HR 82 | Temp 95.9°F | Resp 18 | Wt 148.2 lb

## 2017-12-23 DIAGNOSIS — E785 Hyperlipidemia, unspecified: Secondary | ICD-10-CM

## 2017-12-23 DIAGNOSIS — Z79899 Other long term (current) drug therapy: Secondary | ICD-10-CM

## 2017-12-23 DIAGNOSIS — R630 Anorexia: Secondary | ICD-10-CM

## 2017-12-23 DIAGNOSIS — M810 Age-related osteoporosis without current pathological fracture: Secondary | ICD-10-CM

## 2017-12-23 DIAGNOSIS — Z800 Family history of malignant neoplasm of digestive organs: Secondary | ICD-10-CM | POA: Diagnosis not present

## 2017-12-23 DIAGNOSIS — K8689 Other specified diseases of pancreas: Secondary | ICD-10-CM

## 2017-12-23 DIAGNOSIS — Z8673 Personal history of transient ischemic attack (TIA), and cerebral infarction without residual deficits: Secondary | ICD-10-CM | POA: Diagnosis not present

## 2017-12-23 DIAGNOSIS — D473 Essential (hemorrhagic) thrombocythemia: Secondary | ICD-10-CM

## 2017-12-23 DIAGNOSIS — K862 Cyst of pancreas: Secondary | ICD-10-CM

## 2017-12-23 DIAGNOSIS — R5383 Other fatigue: Secondary | ICD-10-CM

## 2017-12-23 DIAGNOSIS — R5381 Other malaise: Secondary | ICD-10-CM | POA: Diagnosis not present

## 2017-12-23 DIAGNOSIS — I10 Essential (primary) hypertension: Secondary | ICD-10-CM

## 2017-12-23 DIAGNOSIS — Z7982 Long term (current) use of aspirin: Secondary | ICD-10-CM | POA: Diagnosis not present

## 2017-12-23 NOTE — Progress Notes (Signed)
Here for follow up. Per pt " I feel pretty good "per pt   Here today wearing mask as he is worried about germs-per pt

## 2017-12-23 NOTE — Progress Notes (Signed)
Jimmy Jimenez OFFICE PROGRESS NOTE  Patient Care Team: Crecencio Mc, MD as PCP - General (Internal Medicine) Bary Castilla, Forest Gleason, MD as Consulting Physician (General Surgery) Crecencio Mc, MD as Referring Physician (Internal Medicine) Leia Alf, MD (Inactive) as Medical Oncologist (Internal Medicine)   SUMMARY OF HEMATOLOGIC HISTORY:  # 2008- ESSENTIAL THROMBOCYTOSIS- Hydrea 562m/d; [Dr.Pandit] NOV 2008- BMBx- slight increase in enlarged megakayocytes; bcr-abl-Neg;Jak-2 NEG; MPL/CALR-NEG; March 2018- OFF Hydrea [? Sec to weight loss/fatigue]; April 2019- platelets- 505- Re-start Hydrea 500 q OD.   # previous TIA   # March 2018- pancreatic cyst [incidental];   INTERVAL HISTORY:  84year-old male patient with above history of essential thrombocytosis currently on surveillance is here for follow-up/review the results of the MRI.  Patient denies any worsening abdominal pain.  Continues to have poor appetite.  Concerned about weight loss/although stable over the last 3 to 4 months.  No nausea vomiting.  No loss of appetite.  No new blood clots or TIAs.  Denies any burning pain in the extremities.   Review of Systems  Constitutional: Positive for malaise/fatigue and weight loss. Negative for chills, diaphoresis and fever.  HENT: Negative for nosebleeds and sore throat.   Eyes: Negative for double vision.  Respiratory: Negative for cough, hemoptysis, sputum production, shortness of breath and wheezing.   Cardiovascular: Negative for chest pain, palpitations, orthopnea and leg swelling.  Gastrointestinal: Negative for abdominal pain, blood in stool, constipation, diarrhea, heartburn, melena, nausea and vomiting.  Genitourinary: Negative for dysuria, frequency and urgency.  Musculoskeletal: Negative for back pain and joint pain.  Skin: Negative.  Negative for itching and rash.  Neurological: Negative for dizziness, tingling, focal weakness, weakness and headaches.   Endo/Heme/Allergies: Does not bruise/bleed easily.  Psychiatric/Behavioral: Negative for depression. The patient is not nervous/anxious and does not have insomnia.      PAST MEDICAL HISTORY :  Past Medical History:  Diagnosis Date  . Colon polyps   . [place]ential [place]   . [place]   . Hyperlipidemia   . Hypertension   . Lung nodule 12/2004   found on CXR  . Osteoporosis     PAST SURGICAL HISTORY :   Past Surgical History:  Procedure Laterality Date  . BONE MARROW BIOPSY  01/18/07  . COLONOSCOPY  2007, 2010   Dr WAllen Norris . NASAL SINUS SURGERY  06/11/05    FAMILY HISTORY :   Family History  Problem Relation Age of Onset  . Colon cancer Mother        colon age 255's . Peripheral vascular disease Father   . Colon cancer Brother        colon age 82's . Diabetes Unknown   . Prostate cancer Neg Hx   . Bladder Cancer Neg Hx   . Kidney cancer Neg Hx     SOCIAL HISTORY:   Social History   Tobacco Use  . Smoking status: Never Smoker  . Smokeless tobacco: Never Used  Substance Use Topics  . Alcohol use: No  . Drug use: No    ALLERGIES:  is allergic to amlodipine; atenolol; erythromycin; furosemide; hctz [hydrochlorothiazide]; hydrochlorothiazide w-triamterene; levofloxacin; metoprolol; micardis [telmisartan]; penicillins; terazosin; and triamterene-hctz.  MEDICATIONS:  Current Outpatient Medications  Medication Sig Dispense Refill  . amLODipine (NORVASC) 5 MG tablet Take 1 tablet (5 mg total) by mouth daily. 90 tablet 1  . aspirin EC 81 MG tablet Take 81 mg by mouth daily. Reported on 08/14/2015    .  Calcium Citrate-Vitamin D (CITRACAL + D PO) Take 3 tablets by mouth daily. Take 2 by mouth daily, calcium 630 and vitamin D 500 each.    . Cholecalciferol (VITAMIN D3) 1000 UNITS CAPS Take 2,000 Units by mouth daily. Take 2 by mouth daily    . Cyanocobalamin (B-12 COMPLIANCE INJECTION) 1000 MCG/ML KIT Inject as directed.    . docusate  sodium (COLACE) 100 MG capsule Take 100 mg by mouth 2 (two) times daily as needed for mild constipation.    . fluticasone (FLONASE) 50 MCG/ACT nasal spray Place 1 spray into both nostrils daily.     . furosemide (LASIX) 20 MG tablet One tablet every other day in the morning 45 tablet 0  . hydrALAZINE (APRESOLINE) 50 MG tablet Take 1 tablet (50 mg total) by mouth 3 (three) times daily. 270 tablet 1  . hydroxyurea (HYDREA) 500 MG capsule Take 1 pill every other day . may take with food to minimize GI side effects. 60 capsule 3  . ipratropium (ATROVENT) 0.03 % nasal spray Place 2 sprays into both nostrils every 12 (twelve) hours. 30 mL 0  . lisinopril (PRINIVIL,ZESTRIL) 40 MG tablet TAKE 1 TABLET BY MOUTH ONCE DAILY. 90 tablet 1  . testosterone cypionate (DEPOTESTOSTERONE CYPIONATE) 200 MG/ML injection INJECT 0.25 MLS (50MG) INTRAMUSCULARLY EVERY 14 DAYS 10 mL 3  . clotrimazole-betamethasone (LOTRISONE) cream      Current Facility-Administered Medications  Medication Dose Route Frequency Provider Last Rate Last Dose  . testosterone cypionate (DEPOTESTOSTERONE CYPIONATE) injection 50 mg  50 mg Intramuscular Q14 Days Crecencio Mc, MD   50 mg at 07/07/17 1114  . testosterone cypionate (DEPOTESTOSTERONE CYPIONATE) injection 50 mg  50 mg Intramuscular Q14 Days Crecencio Mc, MD   50 mg at 06/23/17 0947  . testosterone cypionate (DEPOTESTOSTERONE CYPIONATE) injection 50 mg  50 mg Intramuscular Once Crecencio Mc, MD      . testosterone cypionate (DEPOTESTOSTERONE CYPIONATE) injection 50 mg  50 mg Intramuscular Q14 Days Crecencio Mc, MD   50 mg at 11/24/17 1224  . testosterone cypionate (DEPOTESTOSTERONE CYPIONATE) injection 50 mg  50 mg Intramuscular Q14 Days Crecencio Mc, MD   50 mg at 12/08/17 1022  . testosterone cypionate (DEPOTESTOSTERONE CYPIONATE) injection 50 mg  50 mg Intramuscular Once Crecencio Mc, MD      . testosterone cypionate (DEPOTESTOTERONE CYPIONATE) injection 50 mg  50 mg  Intramuscular Q14 Days Crecencio Mc, MD        PHYSICAL EXAMINATION:   BP (!) 178/80 (BP Location: Left Arm, Patient Position: Sitting)   Pulse 82 Comment: manually  Temp (!) 95.9 F (35.5 C) (Tympanic)   Resp 18   Wt 148 lb 3.2 oz (67.2 kg)   BMI 22.53 kg/m   Filed Weights   12/23/17 1350  Weight: 148 lb 3.2 oz (67.2 kg)   Physical Exam  Constitutional: He is oriented to person, place, and time and well-developed, well-nourished, and in no distress.  Accompanied by his wife.  Walking by himself.  Frail-appearing.  HENT:  Head: Normocephalic and atraumatic.  Mouth/Throat: Oropharynx is clear and moist. No oropharyngeal exudate.  Eyes: Pupils are equal, round, and reactive to light.  Neck: Normal range of motion. Neck supple.  Cardiovascular: Normal rate and regular rhythm.  Pulmonary/Chest: No respiratory distress. He has no wheezes.  Abdominal: Soft. Bowel sounds are normal. He exhibits no distension and no mass. There is no tenderness. There is no rebound and no guarding.  Musculoskeletal: Normal  range of motion. He exhibits no edema or tenderness.  Neurological: He is alert and oriented to person, place, and time.  Skin: Skin is warm.  Psychiatric: Affect normal.    LABORATORY DATA:  I have reviewed the data as listed    Component Value Date/Time   NA 138 12/20/2017 0957   NA 136 05/19/2011 1049   K 4.4 12/20/2017 0957   K 4.3 05/19/2011 1049   CL 98 12/20/2017 0957   CL 99 05/19/2011 1049   CO2 34 (H) 12/20/2017 0957   CO2 32 05/19/2011 1049   GLUCOSE 147 (H) 12/20/2017 0957   GLUCOSE 116 (H) 05/19/2011 1049   BUN 14 12/20/2017 0957   BUN 8 05/19/2011 1049   CREATININE 1.12 12/20/2017 0957   CREATININE 1.15 12/24/2013 0921   CALCIUM 9.9 12/20/2017 0957   CALCIUM 9.0 05/19/2011 1049   PROT 7.1 12/20/2017 0957   PROT 6.8 12/24/2013 0921   ALBUMIN 4.5 12/20/2017 0957   ALBUMIN 4.0 12/24/2013 0921   AST 23 12/20/2017 0957   AST 23 12/24/2013 0921   ALT  14 12/20/2017 0957   ALT 19 12/24/2013 0921   ALKPHOS 72 12/20/2017 0957   ALKPHOS 57 12/24/2013 0921   BILITOT 1.4 (H) 12/20/2017 0957   BILITOT 0.9 12/24/2013 0921   GFRNONAA 56 (L) 12/20/2017 0957   GFRNONAA >60 12/24/2013 0921   GFRNONAA 50 (L) 09/03/2013 0930   GFRAA >60 12/20/2017 0957   GFRAA >60 12/24/2013 0921   GFRAA 58 (L) 09/03/2013 0930    No results found for: SPEP, UPEP  Lab Results  Component Value Date   WBC 8.1 12/20/2017   NEUTROABS 5.6 12/20/2017   HGB 14.5 12/20/2017   HCT 43.3 12/20/2017   MCV 95.0 12/20/2017   PLT 382 12/20/2017      Chemistry      Component Value Date/Time   NA 138 12/20/2017 0957   NA 136 05/19/2011 1049   K 4.4 12/20/2017 0957   K 4.3 05/19/2011 1049   CL 98 12/20/2017 0957   CL 99 05/19/2011 1049   CO2 34 (H) 12/20/2017 0957   CO2 32 05/19/2011 1049   BUN 14 12/20/2017 0957   BUN 8 05/19/2011 1049   CREATININE 1.12 12/20/2017 0957   CREATININE 1.15 12/24/2013 0921      Component Value Date/Time   CALCIUM 9.9 12/20/2017 0957   CALCIUM 9.0 05/19/2011 1049   ALKPHOS 72 12/20/2017 0957   ALKPHOS 57 12/24/2013 0921   AST 23 12/20/2017 0957   AST 23 12/24/2013 0921   ALT 14 12/20/2017 0957   ALT 19 12/24/2013 0921   BILITOT 1.4 (H) 12/20/2017 0957   BILITOT 0.9 12/24/2013 0921       RADIOGRAPHIC STUDIES: I have personally reviewed the radiological images as listed and agreed with the findings in the report. No results found.   ASSESSMENT & PLAN:   Essential thrombocytosis (H[facility])    Mass of pancreas #Pancreatic cyst-tail the pancreas slowly growing currently 3.2 cm previously 2.5 approximately 6 months ago.  Recommend further evaluation with EUS.  We will reviewed the tumor conference.  CA-19-9-elevated 43/previously 41 6 months ago.   essential thrombocytosis [NEG jak/MLP/CALR]- off Hydrea is March 2018.  CBC is within normal limits.  Platelets 437.  Continue Hydrea 500 mg every other day.  # Elevated Blood  pressure- 540- systolic. Better at home [130-140s] as per pt. stable.  Defer to PCP.  # follow up to be decided based on TB  recommendations.   # I reviewed the blood work- with the patient in detail; also reviewed the imaging independently [as summarized above]; and with the patient in detail.        Cammie Sickle, MD 12/26/2017 8:10 AM

## 2017-12-25 NOTE — Assessment & Plan Note (Addendum)
#  Pancreatic cyst-tail the pancreas slowly growing currently 3.2 cm previously 2.5 approximately 6 months ago.  Recommend further evaluation with EUS.  We will reviewed the tumor conference.  CA-19-9-elevated 43/previously 41 6 months ago.   essential thrombocytosis [NEG jak/MLP/CALR]- off Hydrea is March 2018.  CBC is within normal limits.  Platelets 437.  Continue Hydrea 500 mg every other day.  # Elevated Blood pressure- 882- systolic. Better at home [130-140s] as per pt. stable.  Defer to PCP.  # follow up to be decided based on TB recommendations.   # I reviewed the blood work- with the patient in detail; also reviewed the imaging independently [as summarized above]; and with the patient in detail.

## 2017-12-29 ENCOUNTER — Other Ambulatory Visit: Payer: PPO

## 2017-12-29 NOTE — Progress Notes (Signed)
mdt Tumor Board Documentation  Jimmy Jimenez was presented by Dr Rogue Bussing at our Tumor Board on 12/29/2017, which included representatives from medical oncology, radiation oncology, surgical oncology, surgical, radiology, pathology, navigation, internal medicine, research, pulmonology.  Jimmy Jimenez currently presents as a current patient, for new tumor(s) with history of the following treatments: none.  Additionally, we reviewed previous medical and familial history, history of present illness, and recent lab results along with all available histopathologic and imaging studies. The tumor board considered available treatment options and made the following recommendations: Biopsy EUS  The following procedures/referrals were also placed: No orders of the defined types were placed in this encounter.   Clinical Trial Status: unknown   Staging used:    National site-specific guidelines   were discussed with respect to the case.  Tumor board is a meeting of clinicians from various specialty areas who evaluate and discuss patients for whom a multidisciplinary approach is being considered. Final determinations in the plan of care are those of the provider(s). The responsibility for follow up of recommendations given during tumor board is that of the provider.   Today's extended care, comprehensive team conference, Jimmy Jimenez was not present for the discussion and was not examined.

## 2017-12-30 ENCOUNTER — Telehealth: Payer: Self-pay

## 2017-12-30 NOTE — Telephone Encounter (Signed)
Voicemail left with Mr. Klarich to return call. Dr. Rogue Bussing would like to arrange EUS. Oncology Nurse Navigator Documentation  Navigator Location: [facility] (12/30/17 1100)   )Navigator Encounter Type: Telephone (12/30/17 1100) Telephone: [place] Call (12/30/17 1100)                       Barriers/Navigation Needs: Coordination of Care (12/30/17 1100)   Interventions: Coordination of Care (12/30/17 1100)   Coordination of Care: EUS (12/30/17 1100)                  Time Spent with Patient: 15 (12/30/17 1100)

## 2017-12-30 NOTE — Telephone Encounter (Signed)
Received call back from Ms. Jimmy Jimenez. Updated that Dr. Rogue Bussing presented case at the weekly tumor board on 12/29/17. Since pancreatic cyst is growing in size it was recommended to pursue EUS for biopsy. Dr. Rogue Bussing has asked me to arrange. Explained that we do not have any availability at [facility] until 12/19. We can have EUS performed at [facility] much sooner. He will need to be seen in clinic at [place] prior to assess clearance. They are going to discuss over the weekend and call me on Monday with decision. Oncology Nurse Navigator Documentation  Navigator Location: [facility] (12/30/17 1700)   )Navigator Encounter Type: Telephone (12/30/17 1700) Telephone: Incoming Call (12/30/17 1700)                               Coordination of Care: EUS (12/30/17 1700)                  Time Spent with Patient: 15 (12/30/17 1700)

## 2018-01-03 ENCOUNTER — Telehealth: Payer: Self-pay

## 2018-01-03 NOTE — Telephone Encounter (Signed)
Called Mrs.Hermiz to follow up on EUS for Mr. Caraveo. With Mr. Aday on the phone, he states he wants to put this procedure off for right now. I have reviewed recent CT scan showing that the cystic lesion in his pancreas is increasing in size with them. Dr. Rogue Bussing presented case at tumor board and reviewed the recommendation of EUS with them again. We currently have an opening in December at [facility] if he changes his mind. I do not see any follow up appointments with Dr. Rogue Bussing to discuss further with him. I will notify Dr. Burlene Arnt of above. Oncology Nurse Navigator Documentation  Navigator Location: [facility] (01/03/18 1400)   )Navigator Encounter Type: Telephone (01/03/18 1400) Telephone: [place] Call (01/03/18 1400)                               Coordination of Care: EUS (01/03/18 1400)                  Time Spent with Patient: 15 (01/03/18 1400)

## 2018-01-04 ENCOUNTER — Telehealth: Payer: Self-pay | Admitting: Internal Medicine

## 2018-01-04 DIAGNOSIS — K8689 Other specified diseases of pancreas: Secondary | ICD-10-CM

## 2018-01-04 NOTE — Telephone Encounter (Signed)
Patient declined EUS as recommended the tumor conference for further evaluation of increasing pancreatic cyst/mass.   Collete/Brooke- Please have the patient follow-up with me in approximately 2 months-CBC CMP/CA-19-9 to be done 1 week prior to the visit.  Dr.Tullo- FYI- Thanks! GB

## 2018-01-05 ENCOUNTER — Ambulatory Visit (INDEPENDENT_AMBULATORY_CARE_PROVIDER_SITE_OTHER): Payer: PPO

## 2018-01-05 DIAGNOSIS — E291 Testicular hypofunction: Secondary | ICD-10-CM

## 2018-01-05 DIAGNOSIS — E538 Deficiency of other specified B group vitamins: Secondary | ICD-10-CM

## 2018-01-05 MED ORDER — TESTOSTERONE CYPIONATE 200 MG/ML IM SOLN
50.0000 mg | INTRAMUSCULAR | Status: DC
  Administered 2018-01-05 – 2018-05-11 (×2): 50 mg via INTRAMUSCULAR

## 2018-01-05 MED ORDER — CYANOCOBALAMIN 1000 MCG/ML IJ SOLN
1000.0000 ug | Freq: Once | INTRAMUSCULAR | Status: AC
  Administered 2018-01-05: 1000 ug via INTRAMUSCULAR

## 2018-01-05 NOTE — Progress Notes (Signed)
Patient comes in for B 12 injection and testosterone injection.  B 12  Injected into right  deltoid.  Testosterone injected into right upper outer quadrant. Patient tolerated injections well.

## 2018-01-05 NOTE — Addendum Note (Signed)
Addended by: Sandria Bales B on: 01/05/2018 09:11 AM   Modules accepted: Orders

## 2018-01-08 NOTE — Progress Notes (Signed)
  I have reviewed the above information and agree with above.   Ladell Bey, MD 

## 2018-01-19 ENCOUNTER — Ambulatory Visit (INDEPENDENT_AMBULATORY_CARE_PROVIDER_SITE_OTHER): Payer: PPO | Admitting: *Deleted

## 2018-01-19 DIAGNOSIS — E349 Endocrine disorder, unspecified: Secondary | ICD-10-CM

## 2018-01-19 NOTE — Progress Notes (Signed)
Patient came for testosterone injection to left upper quadrant patient voiced no concerns during injection.

## 2018-02-02 ENCOUNTER — Ambulatory Visit (INDEPENDENT_AMBULATORY_CARE_PROVIDER_SITE_OTHER): Payer: PPO

## 2018-02-02 DIAGNOSIS — E538 Deficiency of other specified B group vitamins: Secondary | ICD-10-CM | POA: Diagnosis not present

## 2018-02-02 DIAGNOSIS — E349 Endocrine disorder, unspecified: Secondary | ICD-10-CM

## 2018-02-02 MED ORDER — TESTOSTERONE CYPIONATE 200 MG/ML IM SOLN
50.0000 mg | Freq: Once | INTRAMUSCULAR | Status: AC
  Administered 2018-02-02: 50 mg via INTRAMUSCULAR

## 2018-02-02 MED ORDER — CYANOCOBALAMIN 1000 MCG/ML IJ SOLN
1000.0000 ug | Freq: Once | INTRAMUSCULAR | Status: AC
  Administered 2018-02-02: 1000 ug via INTRAMUSCULAR

## 2018-02-02 NOTE — Progress Notes (Signed)
Pt was seen today for a NV for b-12 and testerone injection.   B-12 given IM in the RD.  Testerone given IM R upper quadrant. Pt brought the testerone in.

## 2018-02-06 ENCOUNTER — Inpatient Hospital Stay: Payer: PPO | Attending: Internal Medicine

## 2018-02-06 DIAGNOSIS — K8689 Other specified diseases of pancreas: Secondary | ICD-10-CM

## 2018-02-06 DIAGNOSIS — I10 Essential (primary) hypertension: Secondary | ICD-10-CM | POA: Diagnosis not present

## 2018-02-06 DIAGNOSIS — Z79899 Other long term (current) drug therapy: Secondary | ICD-10-CM | POA: Insufficient documentation

## 2018-02-06 DIAGNOSIS — K862 Cyst of pancreas: Secondary | ICD-10-CM | POA: Insufficient documentation

## 2018-02-06 DIAGNOSIS — R978 Other abnormal tumor markers: Secondary | ICD-10-CM | POA: Diagnosis not present

## 2018-02-06 DIAGNOSIS — Z7982 Long term (current) use of aspirin: Secondary | ICD-10-CM | POA: Diagnosis not present

## 2018-02-06 DIAGNOSIS — D473 Essential (hemorrhagic) thrombocythemia: Secondary | ICD-10-CM | POA: Insufficient documentation

## 2018-02-06 DIAGNOSIS — Z8673 Personal history of transient ischemic attack (TIA), and cerebral infarction without residual deficits: Secondary | ICD-10-CM | POA: Diagnosis not present

## 2018-02-06 LAB — COMPREHENSIVE METABOLIC PANEL
ALBUMIN: 4.3 g/dL (ref 3.5–5.0)
ALK PHOS: 67 U/L (ref 38–126)
ALT: 11 U/L (ref 0–44)
AST: 19 U/L (ref 15–41)
Anion gap: 7 (ref 5–15)
BUN: 19 mg/dL (ref 8–23)
CO2: 33 mmol/L — AB (ref 22–32)
CREATININE: 1.15 mg/dL (ref 0.61–1.24)
Calcium: 9.7 mg/dL (ref 8.9–10.3)
Chloride: 96 mmol/L — ABNORMAL LOW (ref 98–111)
GFR calc Af Amer: >60 mL/min (ref >60)
GFR calc non Af Amer: 56 mL/min — ABNORMAL LOW (ref >60)
GLUCOSE: 83 mg/dL (ref 70–99)
Potassium: 3.8 mmol/L (ref 3.5–5.1)
SODIUM: 136 mmol/L (ref 135–145)
Total Bilirubin: 1.3 mg/dL — ABNORMAL HIGH (ref 0.3–1.2)
Total Protein: 6.7 g/dL (ref 6.5–8.1)

## 2018-02-06 LAB — CBC WITH DIFFERENTIAL/PLATELET
Abs Immature Granulocytes: 0.03 10*3/uL (ref 0.00–0.07)
Basophils Absolute: 0.1 10*3/uL (ref 0.0–0.1)
Basophils Relative: 1 %
Eosinophils Absolute: 0.2 10*3/uL (ref 0.0–0.5)
Eosinophils Relative: 3 %
HEMATOCRIT: 43.5 % (ref 39.0–52.0)
Hemoglobin: 14.5 g/dL (ref 13.0–17.0)
IMMATURE GRANULOCYTES: 0 %
LYMPHS ABS: 1.2 10*3/uL (ref 0.7–4.0)
Lymphocytes Relative: 15 %
MCH: 33 pg (ref 26.0–34.0)
MCHC: 33.3 g/dL (ref 30.0–36.0)
MCV: 98.9 fL (ref 80.0–100.0)
MONO ABS: 0.9 10*3/uL (ref 0.1–1.0)
MONOS PCT: 12 %
NEUTROS PCT: 69 %
Neutro Abs: 5.5 10*3/uL (ref 1.7–7.7)
Platelets: 364 10*3/uL (ref 150–400)
RBC: 4.4 MIL/uL (ref 4.22–5.81)
RDW: 12.6 % (ref 11.5–15.5)
WBC: 8 10*3/uL (ref 4.0–10.5)
nRBC: 0 % (ref 0.0–0.2)

## 2018-02-07 LAB — CANCER ANTIGEN 19-9: CA 19-9: 34 U/mL (ref 0–35)

## 2018-02-13 ENCOUNTER — Inpatient Hospital Stay (HOSPITAL_BASED_OUTPATIENT_CLINIC_OR_DEPARTMENT_OTHER): Payer: PPO | Admitting: Internal Medicine

## 2018-02-13 ENCOUNTER — Encounter: Payer: Self-pay | Admitting: Internal Medicine

## 2018-02-13 VITALS — BP 180/70 | HR 73 | Temp 97.4°F | Resp 16 | Wt 145.6 lb

## 2018-02-13 DIAGNOSIS — I10 Essential (primary) hypertension: Secondary | ICD-10-CM | POA: Diagnosis not present

## 2018-02-13 DIAGNOSIS — K862 Cyst of pancreas: Secondary | ICD-10-CM

## 2018-02-13 DIAGNOSIS — D473 Essential (hemorrhagic) thrombocythemia: Secondary | ICD-10-CM | POA: Diagnosis not present

## 2018-02-13 DIAGNOSIS — Z7982 Long term (current) use of aspirin: Secondary | ICD-10-CM

## 2018-02-13 DIAGNOSIS — Z79899 Other long term (current) drug therapy: Secondary | ICD-10-CM | POA: Diagnosis not present

## 2018-02-13 DIAGNOSIS — Z8673 Personal history of transient ischemic attack (TIA), and cerebral infarction without residual deficits: Secondary | ICD-10-CM | POA: Diagnosis not present

## 2018-02-13 DIAGNOSIS — K8689 Other specified diseases of pancreas: Secondary | ICD-10-CM

## 2018-02-13 NOTE — Assessment & Plan Note (Addendum)
#  Pancreatic cyst-tail the pancreas slowly growing- MRI OCT 2019- 3.2 cm previously 2.5 approximately 6 months ago.  Discussed EUS as per the recommendations from the tumor conference.  Patient declined further evaluation at this time. CA-19-9-inconsistent; December 2019-normal.   #Patient is asymptomatic from the pancreatic cyst-unsure of patient weight loss is secondary to above pancreatic cyst.   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  CBC within normal limits.  No concerns for progression or transformation.  # Elevated Blood PMVAEPNT-750 systolic better at home.  Defer to PCP.  # weight loss-unclear etiology less likely from the pancreatic cyst.  Good apetite; will make referral to [place].  Discussed with Almyra Free.  # DISPOSITION: # referral to [facility] now; spoke to [facility] # follow up in 3 months [pt pref]-MD/labs- cbc/cmp/ca-19-9;[facility]-Dr.B

## 2018-02-13 NOTE — Progress Notes (Signed)
Nutrition Assessment   Reason for Assessment:   Verbal referral from Dr. B regarding weight loss, poor appetite   ASSESSMENT:   84 year old male with new pancreatic mass, slow growing and essential thrombocytosis.  Noted patient declined EUS for further evaluation of pancreatic mass.  Past medical history of HLD, HTN.  Met with patient and wife today in clinic.  Patient reports for breakfast eats cereal, lunch is green peas and macaroni and cheese (today).  Usually 2 items (pinto or navy or white bean, green bean, potatos, greens, slaw).  Supper is sometimes hamburger (including bun).  Sometimes just snacks on potato chips.  Denies trouble chewing, swallowing, diarrhea or constipation or pain.  Reports takes stool softner and miralax to help with constipation.  Has BM daily.  Wife prepares meals for patient, sometimes will go out to eat at [employer].  Has tried ensure before but not drinking it currently.    Medications: reviewed   Labs: reviewed   Anthropometrics:   Height: 68 inches Weight: 145 lb 9.6 oz today Noted weight has been 145 -150 lb since Feb 2018.  BMI: 22  Noted 150 lb on 11/18/17  2% weight loss in the last 3 months, not significant  NUTRITION DIAGNOSIS: Inadequate oral intake related to mass, questionable memory decline as evidenced by recent weight loss   INTERVENTION:  Discussed strategies to increase calories and protein to prevent weight from continuing to go down.  Education materials given to patient and wife.   Encouraged restarting oral nutrition supplement (ensure plus, boost plus with 350 calorie or more). Samples and coupons given Contact information given   MONITORING, EVALUATION, GOAL: weight trends, intake   Next Visit: March 16 following MD visit  Axel Frisk B. Zenia Resides, [place], [place] Registered Dietitian [phone] (pager)

## 2018-02-13 NOTE — Progress Notes (Signed)
[facility] OFFICE PROGRESS NOTE  Patient Care Team: Crecencio Mc, MD as PCP - General (Internal Medicine) Bary Castilla, Forest Gleason, MD as Consulting Physician (General Surgery) Crecencio Mc, MD as Referring Physician (Internal Medicine) Leia Alf, MD (Inactive) as Medical Oncologist (Internal Medicine)   SUMMARY OF HEMATOLOGIC HISTORY:  # 2008- ESSENTIAL THROMBOCYTOSIS- Hydrea 533m/d; [Dr.Pandit] NOV 2008- BMBx- slight increase in enlarged megakayocytes; bcr-abl-Neg;Jak-2 NEG; MPL/CALR-NEG; March 2018- OFF Hydrea [? Sec to weight loss/fatigue]; April 2019- platelets- 505- Re-start Hydrea 500 q OD.   # previous TIA   # March 2018- pancreatic cyst [incidental]; slightly growing-October 29 MRI-patient; NOV 2019- declined EUS.  CA-19-9 inconsistent.  INTERVAL HISTORY:  84year-old male patient with above history of essential thrombocytosis currently on surveillance is here for follow-up.  Patient denies any nausea vomiting.  Continues to deny any abdominal pain.  Admits to good appetite however losing weight.  However weight has been stable for the last 3 to 4 months.  No nausea vomiting.  No loss of appetite.  No new blood clots or TIAs.  Denies any burning pain in the extremities.   Review of Systems  Constitutional: Positive for malaise/fatigue and weight loss. Negative for chills, diaphoresis and fever.  HENT: Negative for nosebleeds and sore throat.   Eyes: Negative for double vision.  Respiratory: Negative for cough, hemoptysis, sputum production, shortness of breath and wheezing.   Cardiovascular: Negative for chest pain, palpitations, orthopnea and leg swelling.  Gastrointestinal: Negative for abdominal pain, blood in stool, constipation, diarrhea, heartburn, melena, nausea and vomiting.  Genitourinary: Negative for dysuria, frequency and urgency.  Musculoskeletal: Negative for back pain and joint pain.  Skin: Negative.  Negative for itching and rash.   Neurological: Negative for dizziness, tingling, focal weakness, weakness and headaches.  Endo/Heme/Allergies: Does not bruise/bleed easily.  Psychiatric/Behavioral: Negative for depression. The patient is not nervous/anxious and does not have insomnia.      PAST MEDICAL HISTORY :  Past Medical History:  Diagnosis Date  . Colon polyps   . Essential thrombocytosis ([place]   . Essential thrombocytosis ([facility]   . Hyperlipidemia   . Hypertension   . Lung nodule 12/2004   found on CXR  . Osteoporosis     PAST SURGICAL HISTORY :   Past Surgical History:  Procedure Laterality Date  . BONE MARROW BIOPSY  01/18/07  . COLONOSCOPY  2007, 2010   Dr WAllen Norris . NASAL SINUS SURGERY  06/11/05    FAMILY HISTORY :   Family History  Problem Relation Age of Onset  . Colon cancer Mother        colon age 82's . Peripheral vascular disease Father   . Colon cancer Brother        colon age 82's . Diabetes Unknown   . Prostate cancer Neg Hx   . Bladder Cancer Neg Hx   . Kidney cancer Neg Hx     SOCIAL HISTORY:   Social History   Tobacco Use  . Smoking status: Never Smoker  . Smokeless tobacco: Never Used  Substance Use Topics  . Alcohol use: No  . Drug use: No    ALLERGIES:  is allergic to amlodipine; atenolol; erythromycin; furosemide; hctz [hydrochlorothiazide]; hydrochlorothiazide w-triamterene; levofloxacin; metoprolol; micardis [telmisartan]; penicillins; terazosin; and triamterene-hctz.  MEDICATIONS:  Current Outpatient Medications  Medication Sig Dispense Refill  . amLODipine (NORVASC) 5 MG tablet Take 1 tablet (5 mg total) by mouth daily. 90 tablet 1  . aspirin EC 81  MG tablet Take 81 mg by mouth daily. Reported on 08/14/2015    . Calcium Citrate-Vitamin D (CITRACAL + D PO) Take 3 tablets by mouth daily. Take 2 by mouth daily, calcium 630 and vitamin D 500 each.    . Cholecalciferol (VITAMIN D3) 1000 UNITS CAPS Take 2,000 Units by mouth daily. Take 2 by mouth daily    .  clotrimazole-betamethasone (LOTRISONE) cream     . Cyanocobalamin (B-12 COMPLIANCE INJECTION) 1000 MCG/ML KIT Inject as directed.    . docusate sodium (COLACE) 100 MG capsule Take 100 mg by mouth 2 (two) times daily as needed for mild constipation.    . fluticasone (FLONASE) 50 MCG/ACT nasal spray Place 1 spray into both nostrils daily.     . furosemide (LASIX) 20 MG tablet One tablet every other day in the morning 45 tablet 0  . hydrALAZINE (APRESOLINE) 50 MG tablet Take 1 tablet (50 mg total) by mouth 3 (three) times daily. 270 tablet 1  . hydroxyurea (HYDREA) 500 MG capsule Take 1 pill every other day . may take with food to minimize GI side effects. 60 capsule 3  . ipratropium (ATROVENT) 0.03 % nasal spray Place 2 sprays into both nostrils every 12 (twelve) hours. 30 mL 0  . lisinopril (PRINIVIL,ZESTRIL) 40 MG tablet TAKE 1 TABLET BY MOUTH ONCE DAILY. 90 tablet 1  . testosterone cypionate (DEPOTESTOSTERONE CYPIONATE) 200 MG/ML injection INJECT 0.25 MLS (50MG) INTRAMUSCULARLY EVERY 14 DAYS 10 mL 3   Current Facility-Administered Medications  Medication Dose Route Frequency Provider Last Rate Last Dose  . testosterone cypionate (DEPOTESTOSTERONE CYPIONATE) injection 50 mg  50 mg Intramuscular Q14 Days Crecencio Mc, MD   50 mg at 07/07/17 1114  . testosterone cypionate (DEPOTESTOSTERONE CYPIONATE) injection 50 mg  50 mg Intramuscular Q14 Days Crecencio Mc, MD   50 mg at 06/23/17 0947  . testosterone cypionate (DEPOTESTOSTERONE CYPIONATE) injection 50 mg  50 mg Intramuscular Once Crecencio Mc, MD      . testosterone cypionate (DEPOTESTOSTERONE CYPIONATE) injection 50 mg  50 mg Intramuscular Q14 Days Crecencio Mc, MD   50 mg at 11/24/17 1224  . testosterone cypionate (DEPOTESTOSTERONE CYPIONATE) injection 50 mg  50 mg Intramuscular Q14 Days Crecencio Mc, MD   50 mg at 12/08/17 1022  . testosterone cypionate (DEPOTESTOSTERONE CYPIONATE) injection 50 mg  50 mg Intramuscular Once Crecencio Mc, MD      . testosterone cypionate (DEPOTESTOSTERONE CYPIONATE) injection 50 mg  50 mg Intramuscular Q14 Days Crecencio Mc, MD   50 mg at 01/05/18 1558  . testosterone cypionate (DEPOTESTOTERONE CYPIONATE) injection 50 mg  50 mg Intramuscular Q14 Days Crecencio Mc, MD        PHYSICAL EXAMINATION:   BP (!) 180/70 (BP Location: Left Arm, Patient Position: Sitting, Cuff Size: Normal)   Pulse 73   Temp (!) 97.4 F (36.3 C) (Tympanic)   Resp 16   Wt 145 lb 9.6 oz (66 kg)   BMI 22.14 kg/m   Filed Weights   02/13/18 1517  Weight: 145 lb 9.6 oz (66 kg)   Physical Exam  Constitutional: He is oriented to person, place, and time and well-developed, well-nourished, and in no distress.  Accompanied by his wife.  Walking by himself.  Frail-appearing.  HENT:  Head: Normocephalic and atraumatic.  Mouth/Throat: Oropharynx is clear and moist. No oropharyngeal exudate.  Eyes: Pupils are equal, round, and reactive to light.  Neck: Normal range of motion. Neck supple.  Cardiovascular: Normal rate and regular rhythm.  Pulmonary/Chest: No respiratory distress. He has no wheezes.  Abdominal: Soft. Bowel sounds are normal. He exhibits no distension and no mass. There is no abdominal tenderness. There is no rebound and no guarding.  Musculoskeletal: Normal range of motion.        General: No tenderness or edema.  Neurological: He is alert and oriented to person, place, and time.  Skin: Skin is warm.  Psychiatric: Affect normal.    LABORATORY DATA:  I have reviewed the data as listed    Component Value Date/Time   NA 136 02/06/2018 1114   NA 136 05/19/2011 1049   K 3.8 02/06/2018 1114   K 4.3 05/19/2011 1049   CL 96 (L) 02/06/2018 1114   CL 99 05/19/2011 1049   CO2 33 (H) 02/06/2018 1114   CO2 32 05/19/2011 1049   GLUCOSE 83 02/06/2018 1114   GLUCOSE 116 (H) 05/19/2011 1049   BUN 19 02/06/2018 1114   BUN 8 05/19/2011 1049   CREATININE 1.15 02/06/2018 1114   CREATININE 1.15  12/24/2013 0921   CALCIUM 9.7 02/06/2018 1114   CALCIUM 9.0 05/19/2011 1049   PROT 6.7 02/06/2018 1114   PROT 6.8 12/24/2013 0921   ALBUMIN 4.3 02/06/2018 1114   ALBUMIN 4.0 12/24/2013 0921   AST 19 02/06/2018 1114   AST 23 12/24/2013 0921   ALT 11 02/06/2018 1114   ALT 19 12/24/2013 0921   ALKPHOS 67 02/06/2018 1114   ALKPHOS 57 12/24/2013 0921   BILITOT 1.3 (H) 02/06/2018 1114   BILITOT 0.9 12/24/2013 0921   GFRNONAA 56 (L) 02/06/2018 1114   GFRNONAA >60 12/24/2013 0921   GFRNONAA 50 (L) 09/03/2013 0930   GFRAA >60 02/06/2018 1114   GFRAA >60 12/24/2013 0921   GFRAA 58 (L) 09/03/2013 0930    No results found for: SPEP, UPEP  Lab Results  Component Value Date   WBC 8.0 02/06/2018   NEUTROABS 5.5 02/06/2018   HGB 14.5 02/06/2018   HCT 43.5 02/06/2018   MCV 98.9 02/06/2018   PLT 364 02/06/2018      Chemistry      Component Value Date/Time   NA 136 02/06/2018 1114   NA 136 05/19/2011 1049   K 3.8 02/06/2018 1114   K 4.3 05/19/2011 1049   CL 96 (L) 02/06/2018 1114   CL 99 05/19/2011 1049   CO2 33 (H) 02/06/2018 1114   CO2 32 05/19/2011 1049   BUN 19 02/06/2018 1114   BUN 8 05/19/2011 1049   CREATININE 1.15 02/06/2018 1114   CREATININE 1.15 12/24/2013 0921      Component Value Date/Time   CALCIUM 9.7 02/06/2018 1114   CALCIUM 9.0 05/19/2011 1049   ALKPHOS 67 02/06/2018 1114   ALKPHOS 57 12/24/2013 0921   AST 19 02/06/2018 1114   AST 23 12/24/2013 0921   ALT 11 02/06/2018 1114   ALT 19 12/24/2013 0921   BILITOT 1.3 (H) 02/06/2018 1114   BILITOT 0.9 12/24/2013 0921       RADIOGRAPHIC STUDIES: I have personally reviewed the radiological images as listed and agreed with the findings in the report. No results found.   ASSESSMENT & PLAN:   Mass of pancreas #Pancreatic cyst-tail the pancreas slowly growing- MRI OCT 2019- 3.2 cm previously 2.5 approximately 6 months ago.  Discussed EUS as per the recommendations from the tumor conference.  Patient declined  further evaluation at this time. CA-19-9-inconsistent; December 2019-normal.   #Patient is asymptomatic from the pancreatic  cyst-unsure of patient weight loss is secondary to above pancreatic cyst.   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  CBC within normal limits.  No concerns for progression or transformation.  # Elevated Blood HRCBULAG-536 systolic better at home.  Defer to PCP.  # weight loss-unclear etiology less likely from the pancreatic cyst.  Good apetite; will make referral to [place].  Discussed with Almyra Free.  # DISPOSITION: # referral to [facility] now; spoke to [facility] # follow up in 3 months [pt pref]-MD/labs- cbc/cmp/ca-19-9;[facility]-Dr.B     Cammie Sickle, MD 02/13/2018 6:54 PM

## 2018-02-16 ENCOUNTER — Ambulatory Visit (INDEPENDENT_AMBULATORY_CARE_PROVIDER_SITE_OTHER): Payer: PPO

## 2018-02-16 DIAGNOSIS — E349 Endocrine disorder, unspecified: Secondary | ICD-10-CM | POA: Diagnosis not present

## 2018-02-16 MED ORDER — TESTOSTERONE CYPIONATE 200 MG/ML IM SOLN
50.0000 mg | Freq: Once | INTRAMUSCULAR | Status: AC
  Administered 2018-02-16: 50 mg via INTRAMUSCULAR

## 2018-02-16 NOTE — Progress Notes (Signed)
Pt was seen today for Testerone injection. Given IM in the LUQ. Pt tolerated well.

## 2018-03-02 ENCOUNTER — Ambulatory Visit (INDEPENDENT_AMBULATORY_CARE_PROVIDER_SITE_OTHER): Payer: PPO | Admitting: *Deleted

## 2018-03-02 DIAGNOSIS — E291 Testicular hypofunction: Secondary | ICD-10-CM

## 2018-03-02 DIAGNOSIS — E538 Deficiency of other specified B group vitamins: Secondary | ICD-10-CM

## 2018-03-02 MED ORDER — CYANOCOBALAMIN 1000 MCG/ML IJ SOLN
1000.0000 ug | Freq: Once | INTRAMUSCULAR | Status: AC
  Administered 2018-03-02: 1000 ug via INTRAMUSCULAR

## 2018-03-02 MED ORDER — TESTOSTERONE CYPIONATE 200 MG/ML IM SOLN
50.0000 mg | Freq: Once | INTRAMUSCULAR | Status: AC
  Administered 2018-03-02: 50 mg via INTRAMUSCULAR

## 2018-03-02 NOTE — Progress Notes (Signed)
Patient presented for B 12 injection to left deltoid, patient voiced no concerns nor showed any signs of distress during injection Testosterone injection given in RUQ.

## 2018-03-10 ENCOUNTER — Encounter: Payer: Self-pay | Admitting: Urology

## 2018-03-10 ENCOUNTER — Ambulatory Visit (INDEPENDENT_AMBULATORY_CARE_PROVIDER_SITE_OTHER): Payer: PPO | Admitting: Urology

## 2018-03-10 VITALS — BP 140/61 | Ht 68.0 in | Wt 146.0 lb

## 2018-03-10 DIAGNOSIS — J019 Acute sinusitis, unspecified: Secondary | ICD-10-CM | POA: Diagnosis not present

## 2018-03-10 DIAGNOSIS — N400 Enlarged prostate without lower urinary tract symptoms: Secondary | ICD-10-CM

## 2018-03-10 NOTE — Progress Notes (Signed)
03/10/2018 10:05 AM   Jimmy Jimenez 03/13/1925 [id number]  Referring provider: Crecencio Mc, MD [address] [place], [place] [address]  Chief Complaint  Patient presents with  . Follow-up    HPI: 85 year old male previously seen by Dr. Pilar Jarvis last year for paraphimosis and balanitis.  He made an appointment today to specifically come in and have his prostate examined.  He denies bothersome lower urinary tract symptoms.  He has mild urinary frequency and urgency.  He notes occasional dull penile pain.  Denies recurrent balanitis or paraphimosis.   PMH: Past Medical History:  Diagnosis Date  . Colon polyps   . Essential thrombocytosis ([place])   . Essential thrombocytosis ([place])   . Hyperlipidemia   . Hypertension   . Lung nodule 12/2004   found on CXR  . Osteoporosis     Surgical History: Past Surgical History:  Procedure Laterality Date  . BONE MARROW BIOPSY  01/18/07  . COLONOSCOPY  2007, 2010   Dr Allen Norris  . NASAL SINUS SURGERY  06/11/05    Home Medications:  Allergies as of 03/10/2018      Reactions   Amlodipine    swelling   Atenolol    swelling   Erythromycin    Other reaction(s): Hallucination   Furosemide    Dizziness   Hctz [hydrochlorothiazide]    hyponatremia   Hydrochlorothiazide W-triamterene    Other reaction(s): Other (See Comments) Patient stated is causes low sodium Patient stated is causes low sodium Patient stated is causes low sodium Patient stated is causes low sodium Patient stated is causes low sodium Patient stated is causes low sodium   Levofloxacin    Causes sleepiness   Metoprolol    dizziness   Micardis [telmisartan]    Causes swelling   Penicillins Itching   Has patient had a PCN reaction causing immediate rash, facial/tongue/throat swelling, SOB or lightheadedness with hypotension: No Has patient had a PCN reaction causing severe rash involving mucus membranes or skin necrosis: No Has patient had a PCN  reaction that required hospitalization No Has patient had a PCN reaction occurring within the last 10 years: No If all of the above answers are "NO", then may proceed with Cephalosporin use.   Terazosin    Causes swelling in legs and feet   Triamterene-hctz    Patient stated is causes low sodium      Medication List       Accurate as of March 10, 2018 10:05 AM. Always use your most recent med list.        amLODipine 5 MG tablet Commonly known as:  NORVASC Take 1 tablet (5 mg total) by mouth daily.   aspirin EC 81 MG tablet Take 81 mg by mouth daily. Reported on 08/14/2015   B-12 COMPLIANCE INJECTION 1000 MCG/ML Kit Generic drug:  Cyanocobalamin Inject as directed.   CITRACAL + D PO Take 3 tablets by mouth daily. Take 2 by mouth daily, calcium 630 and vitamin D 500 each.   docusate sodium 100 MG capsule Commonly known as:  COLACE Take 100 mg by mouth 2 (two) times daily as needed for mild constipation.   furosemide 20 MG tablet Commonly known as:  LASIX One tablet every other day in the morning   hydrALAZINE 50 MG tablet Commonly known as:  APRESOLINE Take 1 tablet (50 mg total) by mouth 3 (three) times daily.   hydroxyurea 500 MG capsule Commonly known as:  HYDREA Take 1 pill every other day .  may take with food to minimize GI side effects.   ipratropium 0.03 % nasal spray Commonly known as:  ATROVENT Place 2 sprays into both nostrils every 12 (twelve) hours.   lisinopril 40 MG tablet Commonly known as:  PRINIVIL,ZESTRIL TAKE 1 TABLET BY MOUTH ONCE DAILY.   testosterone cypionate 200 MG/ML injection Commonly known as:  DEPOTESTOSTERONE CYPIONATE INJECT 0.25 MLS (50MG) INTRAMUSCULARLY EVERY 14 DAYS   Vitamin D3 25 MCG (1000 UT) Caps Take 2,000 Units by mouth daily. Take 2 by mouth daily       Allergies:  Allergies  Allergen Reactions  . Amlodipine     swelling  . Atenolol     swelling  . Erythromycin     Other reaction(s): Hallucination  .  Furosemide     Dizziness   . Hctz [Hydrochlorothiazide]     hyponatremia  . Hydrochlorothiazide W-Triamterene     Other reaction(s): Other (See Comments) Patient stated is causes low sodium Patient stated is causes low sodium Patient stated is causes low sodium  Patient stated is causes low sodium   Patient stated is causes low sodium Patient stated is causes low sodium  . Levofloxacin     Causes sleepiness  . Metoprolol     dizziness  . Micardis [Telmisartan]     Causes swelling  . Penicillins Itching    Has patient had a PCN reaction causing immediate rash, facial/tongue/throat swelling, SOB or lightheadedness with hypotension: No Has patient had a PCN reaction causing severe rash involving mucus membranes or skin necrosis: No Has patient had a PCN reaction that required hospitalization No Has patient had a PCN reaction occurring within the last 10 years: No If all of the above answers are "NO", then may proceed with Cephalosporin use.  . Terazosin     Causes swelling in legs and feet  . Triamterene-Hctz     Patient stated is causes low sodium    Family History: Family History  Problem Relation Age of Onset  . Colon cancer Mother        colon age 74's  . Peripheral vascular disease Father   . Colon cancer Brother        colon age 17's  . Diabetes Other   . Prostate cancer Neg Hx   . Bladder Cancer Neg Hx   . Kidney cancer Neg Hx     Social History:  reports that he has never smoked. He has never used smokeless tobacco. He reports that he does not drink alcohol or use drugs.  ROS: UROLOGY Frequent Urination?: Yes Hard to postpone urination?: Yes Burning/pain with urination?: No Get up at night to urinate?: Yes Leakage of urine?: No Urine stream starts and stops?: No Trouble starting stream?: Yes Do you have to strain to urinate?: No Blood in urine?: No Urinary tract infection?: No Sexually transmitted disease?: No Injury to kidneys or bladder?:  No Painful intercourse?: No Weak stream?: No Erection problems?: No Penile pain?: Yes  Gastrointestinal Nausea?: No Vomiting?: No Indigestion/heartburn?: No Diarrhea?: No Constipation?: No  Constitutional Fever: No Night sweats?: No Weight loss?: No Fatigue?: No  Skin Skin rash/lesions?: No Itching?: No  Eyes Blurred vision?: No Double vision?: No  Ears/Nose/Throat Sore throat?: No Sinus problems?: Yes  Hematologic/Lymphatic Swollen glands?: No Easy bruising?: No  Cardiovascular Leg swelling?: Yes Chest pain?: No  Respiratory Cough?: No Shortness of breath?: No  Endocrine Excessive thirst?: No  Musculoskeletal Back pain?: No Joint pain?: No  Neurological Headaches?: No Dizziness?: No  Psychologic Depression?: No Anxiety?: No  Physical Exam: BP 140/61 (BP Location: Left Arm, Patient Position: Sitting, Cuff Size: Normal)   Ht 5' 8"  (1.727 m)   Wt 146 lb (66.2 kg)   BMI 22.20 kg/m   Constitutional:  Alert and oriented, No acute distress. HEENT: [place] AT, moist mucus membranes.  Trachea midline, no masses. Cardiovascular: No clubbing, cyanosis, or edema. Respiratory: Normal respiratory effort, no increased work of breathing. GI: Abdomen is soft, nontender, nondistended, no abdominal masses GU: No CVA tenderness.  Penis uncircumcised.  Foreskin easily retracts.  No erythema.  Testes descended bilaterally without masses or tenderness.  Slightly atrophic.  Prostate 40 g, smooth without nodules or induration.  The right side is slightly asymmetric and brought her in flat but the consistency is normal and uniform throughout. Lymph: No cervical or inguinal lymphadenopathy. Skin: No rashes, bruises or suspicious lesions. Neurologic: Grossly intact, no focal deficits, moving all 4 extremities. Psychiatric: Normal mood and affect.   Assessment & Plan:   85 year old male presented requesting DRE.  He has mild BPH and the DRE is not suspicious.  Follow-up as  needed.  Abbie Sons, [place] [address] [place], [place] [address] [phone]

## 2018-03-14 ENCOUNTER — Ambulatory Visit: Payer: PPO | Admitting: Urology

## 2018-03-14 ENCOUNTER — Other Ambulatory Visit: Payer: Self-pay | Admitting: Internal Medicine

## 2018-03-16 ENCOUNTER — Ambulatory Visit (INDEPENDENT_AMBULATORY_CARE_PROVIDER_SITE_OTHER): Payer: PPO

## 2018-03-16 DIAGNOSIS — E291 Testicular hypofunction: Secondary | ICD-10-CM | POA: Diagnosis not present

## 2018-03-16 MED ORDER — TESTOSTERONE CYPIONATE 200 MG/ML IM SOLN
50.0000 mg | Freq: Once | INTRAMUSCULAR | Status: AC
  Administered 2018-03-16: 50 mg via INTRAMUSCULAR

## 2018-03-16 NOTE — Progress Notes (Addendum)
Pt presents today for Testosterone injection given in LUQ. Pt voiced no concerns nor showed any signs of distress during injection.    I have reviewed the above information and agree with above.   Deborra Medina, MD

## 2018-03-22 ENCOUNTER — Other Ambulatory Visit: Payer: Self-pay | Admitting: Internal Medicine

## 2018-03-30 ENCOUNTER — Ambulatory Visit (INDEPENDENT_AMBULATORY_CARE_PROVIDER_SITE_OTHER): Payer: PPO | Admitting: Lab

## 2018-03-30 DIAGNOSIS — R0981 Nasal congestion: Secondary | ICD-10-CM | POA: Diagnosis not present

## 2018-03-30 DIAGNOSIS — E349 Endocrine disorder, unspecified: Secondary | ICD-10-CM

## 2018-03-30 DIAGNOSIS — E538 Deficiency of other specified B group vitamins: Secondary | ICD-10-CM

## 2018-03-30 DIAGNOSIS — R7989 Other specified abnormal findings of blood chemistry: Secondary | ICD-10-CM

## 2018-03-30 MED ORDER — CYANOCOBALAMIN 1000 MCG/ML IJ SOLN
1000.0000 ug | Freq: Once | INTRAMUSCULAR | Status: AC
  Administered 2018-03-30: 1000 ug via INTRAMUSCULAR

## 2018-03-30 NOTE — Progress Notes (Signed)
Pt here today for vitamin B-12 and Testerone injection. Pt tolerated both injections well. b-12 in left Deltoid testerone in Right side Gluteus maximus

## 2018-04-05 ENCOUNTER — Ambulatory Visit (INDEPENDENT_AMBULATORY_CARE_PROVIDER_SITE_OTHER): Payer: PPO | Admitting: Internal Medicine

## 2018-04-05 ENCOUNTER — Encounter: Payer: Self-pay | Admitting: Internal Medicine

## 2018-04-05 DIAGNOSIS — I272 Pulmonary hypertension, unspecified: Secondary | ICD-10-CM

## 2018-04-05 DIAGNOSIS — E782 Mixed hyperlipidemia: Secondary | ICD-10-CM | POA: Diagnosis not present

## 2018-04-05 DIAGNOSIS — I10 Essential (primary) hypertension: Secondary | ICD-10-CM

## 2018-04-05 DIAGNOSIS — R634 Abnormal weight loss: Secondary | ICD-10-CM | POA: Diagnosis not present

## 2018-04-05 NOTE — Patient Instructions (Signed)
I want you to consider filling out the Advance directives after discussing with your family  We also discussed the form called "Do Not Resuscitate".  This is an order that MUST be available for the EMS staff to review if you have a "Code Blue" at home and do not want them to perform CPR   Advance Directive  Advance directives are legal documents that let you make choices ahead of time about your health care and medical treatment in case you become unable to communicate for yourself. Advance directives are a way for you to communicate your wishes to family, friends, and health care providers. This can help convey your decisions about end-of-life care if you become unable to communicate. Discussing and writing advance directives should happen over time rather than all at once. Advance directives can be changed depending on your situation and what you want, even after you have signed the advance directives. If you do not have an advance directive, some states assign family decision makers to act on your behalf based on how closely you are related to them. Each state has its own laws regarding advance directives. You may want to check with your health care provider, attorney, or state representative about the laws in your state. There are different types of advance directives, such as:  Medical power of attorney.  Living will.  Do not resuscitate (DNR) or do not attempt resuscitation (DNAR) order. Health care proxy and medical power of attorney A health care proxy, also called a health care agent, is a person who is appointed to make medical decisions for you in cases in which you are unable to make the decisions yourself. Generally, people choose someone they know well and trust to represent their preferences. Make sure to ask this person for an agreement to act as your proxy. A proxy may have to exercise judgment in the event of a medical decision for which your wishes are not known. A medical power  of attorney is a legal document that names your health care proxy. Depending on the laws in your state, after the document is written, it may also need to be:  Signed.  Notarized.  Dated.  Copied.  Witnessed.  Incorporated into your medical record. You may also want to appoint someone to manage your financial affairs in a situation in which you are unable to do so. This is called a durable power of attorney for finances. It is a separate legal document from the durable power of attorney for health care. You may choose the same person or someone different from your health care proxy to act as your agent in financial matters. If you do not appoint a proxy, or if there is a concern that the proxy is not acting in your best interests, a court-appointed guardian may be designated to act on your behalf. Living will A living will is a set of instructions documenting your wishes about medical care when you cannot express them yourself. Health care providers should keep a copy of your living will in your medical record. You may want to give a copy to family members or friends. To alert caregivers in case of an emergency, you can place a card in your wallet to let them know that you have a living will and where they can find it. A living will is used if you become:  Terminally ill.  Incapacitated.  Unable to communicate or make decisions. Items to consider in your living will include:  The use  or non-use of life-sustaining equipment, such as dialysis machines and breathing machines (ventilators).  A DNR or DNAR order, which is the instruction not to use cardiopulmonary resuscitation (CPR) if breathing or heartbeat stops.  The use or non-use of tube feeding.  Withholding of food and fluids.  Comfort (palliative) care when the goal becomes comfort rather than a cure.  Organ and tissue donation. A living will does not give instructions for distributing your money and property if you should  pass away. It is recommended that you seek the advice of a lawyer when writing a will. Decisions about taxes, beneficiaries, and asset distribution will be legally binding. This process can relieve your family and friends of any concerns surrounding disputes or questions that may come up about the distribution of your assets. DNR or DNAR A DNR or DNAR order is a request not to have CPR in the event that your heart stops beating or you stop breathing. If a DNR or DNAR order has not been made and shared, a health care provider will try to help any patient whose heart has stopped or who has stopped breathing. If you plan to have surgery, talk with your health care provider about how your DNR or DNAR order will be followed if problems occur. Summary  Advance directives are the legal documents that allow you to make choices ahead of time about your health care and medical treatment in case you become unable to communicate for yourself.  The process of discussing and writing advance directives should happen over time. You can change the advance directives, even after you have signed them.  Advance directives include DNR or DNAR orders, living wills, and designating an agent as your medical power of attorney. This information is not intended to replace advice given to you by your health care provider. Make sure you discuss any questions you have with your health care provider. Document Released: 05/25/2007 Document Revised: 01/05/2016 Document Reviewed: 01/05/2016 Elsevier Interactive Patient Education  2019 [employer].

## 2018-04-05 NOTE — Progress Notes (Signed)
Patient ID: Jimmy Jimenez, male    DOB: 03/13/1925  Age: 85 y.o. MRN: [id number]  The patient is here for  Follow up and management of other chronic and acute problems.   The risk factors are reflected in the social history.  The roster of all physicians providing medical care to patient - is listed in the Snapshot section of the chart.  Activities of daily living:  The patient is 100% independent in all ADLs: dressing, toileting, feeding as well as independent mobility  Home safety : The patient has smoke detectors in the home. They wear seatbelts.  There are no firearms at home. There is no violence in the home.   There is no risks for hepatitis, STDs or HIV. There is no   history of blood transfusion. They have no travel history to infectious disease endemic areas of the world.  The patient has seen their dentist in the last six month. They have seen their eye doctor in the last year. They admit to slight hearing difficulty with regard to whispered voices and some television programs.  They have deferred audiologic testing in the last year.  They do not  have excessive sun exposure. Discussed the need for sun protection: hats, long sleeves and use of sunscreen if there is significant sun exposure.   Diet: the importance of a healthy diet is discussed. They do have a healthy diet.  The benefits of regular aerobic exercise were discussed. She walks 4 times per week ,  20 minutes.   Depression screen: there are no signs or vegative symptoms of depression- irritability, change in appetite, anhedonia, sadness/tearfullness.  Cognitive assessment: the patient manages all their financial and personal affairs and is actively engaged. They could relate day,date,year and events; recalled 2/3 objects at 3 minutes; performed clock-face test normally.  During the course of the visit , End of Life objectives were discussed at length,  Patient does not have a living will in place or a healthcare power of  attorney.  She was given printed information about advance directives and encouraged to return after discussing with her family,    The following portions of the patient's history were reviewed and updated as appropriate: allergies, current medications, past family history, past medical history,  past surgical history, past social history  and problem list.  Visual acuity was not assessed per patient preference since she has regular follow up with her ophthalmologist. Hearing and body mass index were assessed and reviewed.   During the course of the visit the patient was educated and counseled about appropriate screening and preventive services including : fall prevention , diabetes screening, nutrition counseling, colorectal cancer screening, and recommended immunizations.    CC: Diagnoses of Essential hypertension, Pulmonary hypertension, mild ([place]), Mixed hyperlipidemia, and Abnormal weight loss were pertinent to this visit.     Jimmy Jimenez presents for follow up on multiple issues, including hypertension with diastolic dysfunction ,   nocturia x 4 .  Water intake reviewed.  He drinks an excessive amoumt of water after 7 pm.   taking colace  regularly with miralax  For constipation  Treated with abx for sinusitis jan 10  Sinuses feel better still having occasional light nosebleeds.  No bladder issues   Weight is stable, up 1 lbs from September   No falls    During the course of the visit , End of Life objectives were discussed at length,  Patient does not have a living will in place or a healthcare  power of attorney.  he was given printed information about advance directives and do not resuscitate orders and ws encouraged to return after discussing with his family.     History Jimmy Jimenez has a past medical history of Colon polyps, Essential thrombocytosis ([place]), Essential thrombocytosis ([place]), Hyperlipidemia, Hypertension, Lung nodule (12/2004), and Osteoporosis.   He has a past  surgical history that includes Nasal sinus surgery (06/11/05); Colonoscopy (2007, 2010); and Bone marrow biopsy (01/18/07).   His family history includes Colon cancer in his brother and mother; Diabetes in an other family member; Peripheral vascular disease in his father.He reports that he has never smoked. He has never used smokeless tobacco. He reports that he does not drink alcohol or use drugs.  Outpatient Medications Prior to Visit  Medication Sig Dispense Refill  . amLODipine (NORVASC) 5 MG tablet Take 1 tablet (5 mg total) by mouth daily. 90 tablet 1  . aspirin EC 81 MG tablet Take 81 mg by mouth daily. Reported on 08/14/2015    . Calcium Citrate-Vitamin D (CITRACAL + D PO) Take 3 tablets by mouth daily. Take 2 by mouth daily, calcium 630 and vitamin D 500 each.    . Cholecalciferol (VITAMIN D3) 1000 UNITS CAPS Take 2,000 Units by mouth daily. Take 2 by mouth daily    . clotrimazole-betamethasone (LOTRISONE) cream     . Cyanocobalamin (B-12 COMPLIANCE INJECTION) 1000 MCG/ML KIT Inject as directed.    . docusate sodium (COLACE) 100 MG capsule Take 100 mg by mouth 2 (two) times daily as needed for mild constipation.    . furosemide (LASIX) 20 MG tablet TAKE 1 TABLET BY MOUTH EVERY OTHER DAY IN MORNING 45 tablet 0  . hydrALAZINE (APRESOLINE) 50 MG tablet Take 1 tablet (50 mg total) by mouth 3 (three) times daily. 270 tablet 1  . hydroxyurea (HYDREA) 500 MG capsule Take 1 pill every other day . may take with food to minimize GI side effects. 60 capsule 3  . ipratropium (ATROVENT) 0.03 % nasal spray Place 2 sprays into both nostrils every 12 (twelve) hours. 30 mL 0  . lisinopril (PRINIVIL,ZESTRIL) 40 MG tablet TAKE 1 TABLET BY MOUTH ONCE DAILY 90 tablet 1  . testosterone cypionate (DEPOTESTOSTERONE CYPIONATE) 200 MG/ML injection INJECT 0.25 MLS (50MG) INTRAMUSCULARLY EVERY 14 DAYS 10 mL 3   Facility-Administered Medications Prior to Visit  Medication Dose Route Frequency Provider Last Rate Last  Dose  . testosterone cypionate (DEPOTESTOSTERONE CYPIONATE) injection 50 mg  50 mg Intramuscular Q14 Days Crecencio Mc, MD   50 mg at 07/07/17 1114  . testosterone cypionate (DEPOTESTOSTERONE CYPIONATE) injection 50 mg  50 mg Intramuscular Q14 Days Crecencio Mc, MD   50 mg at 06/23/17 0947  . testosterone cypionate (DEPOTESTOSTERONE CYPIONATE) injection 50 mg  50 mg Intramuscular Once Crecencio Mc, MD      . testosterone cypionate (DEPOTESTOSTERONE CYPIONATE) injection 50 mg  50 mg Intramuscular Q14 Days Crecencio Mc, MD   50 mg at 11/24/17 1224  . testosterone cypionate (DEPOTESTOSTERONE CYPIONATE) injection 50 mg  50 mg Intramuscular Q14 Days Crecencio Mc, MD   50 mg at 12/08/17 1022  . testosterone cypionate (DEPOTESTOSTERONE CYPIONATE) injection 50 mg  50 mg Intramuscular Once Crecencio Mc, MD      . testosterone cypionate (DEPOTESTOSTERONE CYPIONATE) injection 50 mg  50 mg Intramuscular Q14 Days Crecencio Mc, MD   50 mg at 01/05/18 1558  . testosterone cypionate (DEPOTESTOTERONE CYPIONATE) injection 50 mg  50 mg Intramuscular  Q14 Days Crecencio Mc, MD   50 mg at 03/30/18 1139    Review of Systems   Patient denies headache, fevers, malaise, unintentional weight loss, skin rash, eye pain, sinus congestion and sinus pain, sore throat, dysphagia,  hemoptysis , cough, dyspnea, wheezing, chest pain, palpitations, orthopnea, edema, abdominal pain, nausea, melena, diarrhea, constipation, flank pain, dysuria, hematuria, urinary  Frequency, nocturia, numbness, tingling, seizures,  Focal weakness, Loss of consciousness,  Tremor, insomnia, depression, anxiety, and suicidal ideation.      Objective:  BP 122/78 (BP Location: Left Arm, Patient Position: Sitting, Cuff Size: Normal)   Pulse 65   Temp (!) 97.5 F (36.4 C) (Oral)   Resp 15   Ht 5' 8"  (1.727 m)   Wt 151 lb (68.5 kg)   SpO2 96%   BMI 22.96 kg/m   Physical Exam  General appearance: alert, cooperative and appears  stated age Ears: normal TM's and external ear canals both ears Throat: lips, mucosa, and tongue normal; teeth and gums normal Neck: no adenopathy, no carotid bruit, supple, symmetrical, trachea midline and thyroid not enlarged, symmetric, no tenderness/mass/nodules Back: symmetric, no curvature. ROM normal. No CVA tenderness. Lungs: clear to auscultation bilaterally Heart: regular rate and rhythm, S1, S2 normal, no murmur, click, rub or gallop Abdomen: soft, non-tender; bowel sounds normal; no masses,  no organomegaly Pulses: 2+ and symmetric Skin: Skin color, texture, turgor normal. No rashes or lesions Lymph nodes: Cervical, supraclavicular, and axillary nodes normal.   Assessment & Plan:   Problem List Items Addressed This Visit    Pulmonary hypertension, mild ([place])    He is asymptomatic, partly due to sedentary lifestyle congruent with age      Hypertension    Well controlled on current regimen. Renal function stable, no changes today.  Lab Results  Component Value Date   CREATININE 1.15 02/06/2018   Lab Results  Component Value Date   NA 136 02/06/2018   K 3.8 02/06/2018   CL 96 (L) 02/06/2018   CO2 33 (H) 02/06/2018         Hyperlipidemia    Untreated due to statin related muscle weakness.       Abnormal weight loss    His weight has plateaued and he has actually gained a lb since his las visit.         A total of 40 minutes was spent with patient more than half of which was spent in counseling patient on the above mentioned issues , reviewing and explaining recent labs and imaging studies done, and coordination of care.    I am having Jimmy Jimenez maintain his Calcium Citrate-Vitamin D (CITRACAL + D PO), Vitamin D3, aspirin EC, docusate sodium, Cyanocobalamin, hydroxyurea, amLODipine, hydrALAZINE, testosterone cypionate, ipratropium, furosemide, lisinopril, and clotrimazole-betamethasone. We will continue to administer testosterone cypionate, testosterone  cypionate, testosterone cypionate, testosterone cypionate, testosterone cypionate, testosterone cypionate, testosterone cypionate, and testosterone cypionate.  No orders of the defined types were placed in this encounter.   There are no discontinued medications.  Follow-up: Return in about 6 months (around 10/04/2018).   Crecencio Mc, MD

## 2018-04-06 NOTE — Assessment & Plan Note (Signed)
His weight has plateaued and he has actually gained a lb since his las visit.

## 2018-04-06 NOTE — Assessment & Plan Note (Signed)
Untreated due to statin related muscle weakness.

## 2018-04-06 NOTE — Assessment & Plan Note (Signed)
He is asymptomatic, partly due to sedentary lifestyle congruent with age

## 2018-04-06 NOTE — Assessment & Plan Note (Signed)
Well controlled on current regimen. Renal function stable, no changes today.  Lab Results  Component Value Date   CREATININE 1.15 02/06/2018   Lab Results  Component Value Date   NA 136 02/06/2018   K 3.8 02/06/2018   CL 96 (L) 02/06/2018   CO2 33 (H) 02/06/2018

## 2018-04-13 ENCOUNTER — Ambulatory Visit (INDEPENDENT_AMBULATORY_CARE_PROVIDER_SITE_OTHER): Payer: PPO

## 2018-04-13 DIAGNOSIS — E291 Testicular hypofunction: Secondary | ICD-10-CM | POA: Diagnosis not present

## 2018-04-13 MED ORDER — TESTOSTERONE CYPIONATE 200 MG/ML IM SOLN
200.0000 mg | Freq: Once | INTRAMUSCULAR | Status: AC
  Administered 2018-04-13: 200 mg via INTRAMUSCULAR

## 2018-04-13 NOTE — Progress Notes (Addendum)
Pt presents today for testosterone injection. Left gluteus maximus. Pt voiced no concerns nor showed any signs of distress during injection.    I have reviewed the above information and agree with above.   Deborra Medina, MD

## 2018-04-22 NOTE — Progress Notes (Signed)
  I have reviewed the above information and agree with above.   Tyrik Stetzer, MD 

## 2018-04-27 ENCOUNTER — Ambulatory Visit (INDEPENDENT_AMBULATORY_CARE_PROVIDER_SITE_OTHER): Payer: PPO

## 2018-04-27 DIAGNOSIS — E538 Deficiency of other specified B group vitamins: Secondary | ICD-10-CM | POA: Diagnosis not present

## 2018-04-27 DIAGNOSIS — E291 Testicular hypofunction: Secondary | ICD-10-CM

## 2018-04-27 MED ORDER — CYANOCOBALAMIN 1000 MCG/ML IJ SOLN
1000.0000 ug | Freq: Once | INTRAMUSCULAR | Status: AC
  Administered 2018-04-27: 1000 ug via INTRAMUSCULAR

## 2018-04-27 MED ORDER — TESTOSTERONE CYPIONATE 200 MG/ML IM SOLN
200.0000 mg | Freq: Once | INTRAMUSCULAR | Status: AC
  Administered 2018-04-27: 200 mg via INTRAMUSCULAR

## 2018-04-27 NOTE — Progress Notes (Signed)
  I have reviewed the above information and agree with above.   Teresa Tullo, MD 

## 2018-04-27 NOTE — Progress Notes (Signed)
Patient presents today for both testosterone and B12 injections. Testosterone injection was given in the right gluteus maximus and the B12 injection was given in the right deltoid. Pt voiced on concerns nor showed any signs of distress during injections.

## 2018-05-11 ENCOUNTER — Ambulatory Visit (INDEPENDENT_AMBULATORY_CARE_PROVIDER_SITE_OTHER): Payer: PPO

## 2018-05-11 ENCOUNTER — Other Ambulatory Visit: Payer: Self-pay

## 2018-05-11 DIAGNOSIS — E291 Testicular hypofunction: Secondary | ICD-10-CM | POA: Diagnosis not present

## 2018-05-11 DIAGNOSIS — E349 Endocrine disorder, unspecified: Secondary | ICD-10-CM

## 2018-05-11 NOTE — Progress Notes (Signed)
Jimmy Jimenez presents today for injection per MD orders. Testosterone injection  administered IM in left Gluteal. Administration without incident. Patient tolerated well.  Gae Bon, CMA

## 2018-05-12 ENCOUNTER — Other Ambulatory Visit: Payer: Self-pay

## 2018-05-12 DIAGNOSIS — K8689 Other specified diseases of pancreas: Secondary | ICD-10-CM

## 2018-05-15 ENCOUNTER — Inpatient Hospital Stay: Payer: PPO | Admitting: Internal Medicine

## 2018-05-15 ENCOUNTER — Inpatient Hospital Stay: Payer: PPO

## 2018-05-25 ENCOUNTER — Other Ambulatory Visit: Payer: Self-pay

## 2018-05-25 ENCOUNTER — Ambulatory Visit (INDEPENDENT_AMBULATORY_CARE_PROVIDER_SITE_OTHER): Payer: PPO

## 2018-05-25 DIAGNOSIS — E538 Deficiency of other specified B group vitamins: Secondary | ICD-10-CM

## 2018-05-25 DIAGNOSIS — E349 Endocrine disorder, unspecified: Secondary | ICD-10-CM | POA: Diagnosis not present

## 2018-05-25 MED ORDER — CYANOCOBALAMIN 1000 MCG/ML IJ SOLN
1000.0000 ug | Freq: Once | INTRAMUSCULAR | Status: AC
  Administered 2018-05-25: 1000 ug via INTRAMUSCULAR

## 2018-05-25 MED ORDER — TESTOSTERONE CYPIONATE 200 MG/ML IM SOLN
50.0000 mg | INTRAMUSCULAR | Status: DC
  Administered 2018-05-25 – 2018-08-03 (×2): 50 mg via INTRAMUSCULAR

## 2018-05-25 NOTE — Progress Notes (Signed)
Jimmy Jimenez presents today for injection per MD orders. B12 injection administered IM in right Upper Arm. Administration without incident. Patient tolerated well.    Jimmy Jimenez presents today for injection per MD orders. Testosterone injection administered IM in left Gluteal. Administration without incident. Patient tolerated well.   Win Guajardo,cma

## 2018-06-08 ENCOUNTER — Ambulatory Visit (INDEPENDENT_AMBULATORY_CARE_PROVIDER_SITE_OTHER): Payer: PPO

## 2018-06-08 ENCOUNTER — Other Ambulatory Visit: Payer: Self-pay

## 2018-06-08 DIAGNOSIS — E349 Endocrine disorder, unspecified: Secondary | ICD-10-CM

## 2018-06-08 MED ORDER — TESTOSTERONE CYPIONATE 100 MG/ML IM SOLN
50.0000 mg | INTRAMUSCULAR | Status: DC
  Administered 2018-06-08: 50 mg via INTRAMUSCULAR

## 2018-06-08 NOTE — Progress Notes (Signed)
Jimmy Jimenez presents today for injection per MD orders. TESTOSTERONE injection administered IM in left Gluteal. Administration without incident. Patient tolerated well.  Carmen Vallecillo,cma

## 2018-06-15 ENCOUNTER — Other Ambulatory Visit: Payer: Self-pay | Admitting: Internal Medicine

## 2018-06-22 ENCOUNTER — Other Ambulatory Visit: Payer: Self-pay

## 2018-06-22 ENCOUNTER — Ambulatory Visit (INDEPENDENT_AMBULATORY_CARE_PROVIDER_SITE_OTHER): Payer: PPO

## 2018-06-22 DIAGNOSIS — E349 Endocrine disorder, unspecified: Secondary | ICD-10-CM | POA: Diagnosis not present

## 2018-06-22 DIAGNOSIS — E538 Deficiency of other specified B group vitamins: Secondary | ICD-10-CM

## 2018-06-22 MED ORDER — CYANOCOBALAMIN 1000 MCG/ML IJ SOLN
1000.0000 ug | Freq: Once | INTRAMUSCULAR | Status: AC
  Administered 2018-06-22: 1000 ug via INTRAMUSCULAR

## 2018-06-22 MED ORDER — TESTOSTERONE CYPIONATE 100 MG/ML IM SOLN
50.0000 mg | INTRAMUSCULAR | Status: DC
  Administered 2018-06-22: 11:00:00 50 mg via INTRAMUSCULAR

## 2018-06-22 NOTE — Progress Notes (Signed)
Jimmy Jimenez presents today for injection per MD orders. B12 injection  Administered IM  in left Upper Arm. Administration without incident. Patient tolerated well.  Xiomara Sevillano,cma   Jimmy Jimenez presents today for injection per MD orders. Testosterone injection administered IM in   Right Gluteal. Administration without incident. Patient tolerated well.  Kenric Ginger,cma

## 2018-06-26 ENCOUNTER — Inpatient Hospital Stay: Payer: PPO

## 2018-06-26 ENCOUNTER — Inpatient Hospital Stay: Payer: PPO | Admitting: Internal Medicine

## 2018-06-29 ENCOUNTER — Other Ambulatory Visit: Payer: Self-pay | Admitting: Internal Medicine

## 2018-07-06 ENCOUNTER — Other Ambulatory Visit: Payer: Self-pay

## 2018-07-06 ENCOUNTER — Ambulatory Visit (INDEPENDENT_AMBULATORY_CARE_PROVIDER_SITE_OTHER): Payer: PPO

## 2018-07-06 DIAGNOSIS — E349 Endocrine disorder, unspecified: Secondary | ICD-10-CM

## 2018-07-06 MED ORDER — TESTOSTERONE CYPIONATE 100 MG/ML IM SOLN
50.0000 mg | INTRAMUSCULAR | Status: DC
  Administered 2018-07-06 – 2018-07-20 (×2): 50 mg via INTRAMUSCULAR

## 2018-07-06 NOTE — Progress Notes (Addendum)
Jimmy Jimenez presents today for injection per MD orders. Testosterone injection  administered IM in right Gluteal. Administration without incident. Patient tolerated well. Nina,cma  Reviewed.  Dr Nicki Reaper

## 2018-07-16 ENCOUNTER — Other Ambulatory Visit: Payer: Self-pay | Admitting: Internal Medicine

## 2018-07-20 ENCOUNTER — Ambulatory Visit (INDEPENDENT_AMBULATORY_CARE_PROVIDER_SITE_OTHER): Payer: PPO

## 2018-07-20 ENCOUNTER — Other Ambulatory Visit: Payer: Self-pay

## 2018-07-20 DIAGNOSIS — E538 Deficiency of other specified B group vitamins: Secondary | ICD-10-CM

## 2018-07-20 DIAGNOSIS — E349 Endocrine disorder, unspecified: Secondary | ICD-10-CM

## 2018-07-20 MED ORDER — CYANOCOBALAMIN 1000 MCG/ML IJ SOLN
1000.0000 ug | Freq: Once | INTRAMUSCULAR | Status: AC
  Administered 2018-07-20: 1000 ug via INTRAMUSCULAR

## 2018-07-20 NOTE — Progress Notes (Signed)
Jimmy Jimenez presents today for injection per MD orders. B12 injection administered IM in left Upper Arm. Administration without incident. Patient tolerated well.  Nina,cma   Jimmy Jimenez presents today for injection per MD orders. Testosterone injection administered IM in left Gluteal. Administration without incident. Patient tolerated well.  Nina,cma

## 2018-07-26 ENCOUNTER — Telehealth: Payer: Self-pay | Admitting: Internal Medicine

## 2018-07-27 ENCOUNTER — Inpatient Hospital Stay (HOSPITAL_BASED_OUTPATIENT_CLINIC_OR_DEPARTMENT_OTHER): Payer: PPO | Admitting: Internal Medicine

## 2018-07-27 ENCOUNTER — Inpatient Hospital Stay: Payer: PPO

## 2018-07-27 ENCOUNTER — Encounter: Payer: Self-pay | Admitting: Internal Medicine

## 2018-07-27 ENCOUNTER — Other Ambulatory Visit: Payer: Self-pay

## 2018-07-27 ENCOUNTER — Inpatient Hospital Stay: Payer: PPO | Attending: Internal Medicine

## 2018-07-27 DIAGNOSIS — K862 Cyst of pancreas: Secondary | ICD-10-CM | POA: Insufficient documentation

## 2018-07-27 DIAGNOSIS — D473 Essential (hemorrhagic) thrombocythemia: Secondary | ICD-10-CM

## 2018-07-27 DIAGNOSIS — K8689 Other specified diseases of pancreas: Secondary | ICD-10-CM

## 2018-07-27 DIAGNOSIS — R978 Other abnormal tumor markers: Secondary | ICD-10-CM | POA: Insufficient documentation

## 2018-07-27 LAB — CBC WITH DIFFERENTIAL/PLATELET
Abs Immature Granulocytes: 0.05 10*3/uL (ref 0.00–0.07)
Basophils Absolute: 0.1 10*3/uL (ref 0.0–0.1)
Basophils Relative: 1 %
Eosinophils Absolute: 0.1 10*3/uL (ref 0.0–0.5)
Eosinophils Relative: 1 %
HCT: 41.2 % (ref 39.0–52.0)
Hemoglobin: 13.7 g/dL (ref 13.0–17.0)
Immature Granulocytes: 1 %
Lymphocytes Relative: 16 %
Lymphs Abs: 1.3 10*3/uL (ref 0.7–4.0)
MCH: 33.1 pg (ref 26.0–34.0)
MCHC: 33.3 g/dL (ref 30.0–36.0)
MCV: 99.5 fL (ref 80.0–100.0)
Monocytes Absolute: 0.7 10*3/uL (ref 0.1–1.0)
Monocytes Relative: 9 %
Neutro Abs: 5.8 10*3/uL (ref 1.7–7.7)
Neutrophils Relative %: 72 %
Platelets: 417 10*3/uL — ABNORMAL HIGH (ref 150–400)
RBC: 4.14 MIL/uL — ABNORMAL LOW (ref 4.22–5.81)
RDW: 11.9 % (ref 11.5–15.5)
WBC: 7.9 10*3/uL (ref 4.0–10.5)
nRBC: 0 % (ref 0.0–0.2)

## 2018-07-27 LAB — COMPREHENSIVE METABOLIC PANEL
ALT: 15 U/L (ref 0–44)
AST: 22 U/L (ref 15–41)
Albumin: 4.5 g/dL (ref 3.5–5.0)
Alkaline Phosphatase: 71 U/L (ref 38–126)
Anion gap: 7 (ref 5–15)
BUN: 13 mg/dL (ref 8–23)
CO2: 29 mmol/L (ref 22–32)
Calcium: 9.5 mg/dL (ref 8.9–10.3)
Chloride: 96 mmol/L — ABNORMAL LOW (ref 98–111)
Creatinine, Ser: 1.01 mg/dL (ref 0.61–1.24)
GFR calc Af Amer: >60 mL/min (ref >60)
GFR calc non Af Amer: >60 mL/min (ref >60)
Glucose, Bld: 146 mg/dL — ABNORMAL HIGH (ref 70–99)
Potassium: 4.3 mmol/L (ref 3.5–5.1)
Sodium: 132 mmol/L — ABNORMAL LOW (ref 135–145)
Total Bilirubin: 1 mg/dL (ref 0.3–1.2)
Total Protein: 6.7 g/dL (ref 6.5–8.1)

## 2018-07-27 LAB — LACTATE DEHYDROGENASE: LDH: 153 U/L (ref 98–192)

## 2018-07-27 NOTE — Assessment & Plan Note (Addendum)
#  Pancreatic cyst-tail the pancreas slowly growing- MRI OCT 2019- 3.2 cm.  Patient declined EUS.  Continue surveillance clinically asymptomatic.  Awaiting CA 19-today.   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  Platelets slightly elevated 417; overall stable.  Normal hemoglobin.  # Elevated Blood pressure-stable as per patient.  # weight loss-~currently being around 150 pounds.  Stable.   # DISPOSITION: # follow up in 4 months [pt pref]-MD/labs- cbc/cmp/ca-19-9; Dr.B

## 2018-07-27 NOTE — Progress Notes (Signed)
I connected with [name] on 07/27/18 at 11:00 AM EDT by telephone visit and verified that I am speaking with the correct person using two identifiers.  I discussed the limitations, risks, security and privacy concerns of performing an evaluation and management service by telemedicine and the availability of in-person appointments. I also discussed with the patient that there may be a patient responsible charge related to this service. The patient expressed understanding and agreed to proceed.    Other persons participating in the visit and their role in the encounter: RN/medical reconciliation; wife coordination of care Patient's location: Home Provider's location: Home  Oncology History   # 2008- ESSENTIAL THROMBOCYTOSIS- Hydrea 500mg /d; NOV 2008- BMBx- slight increase in enlarged megakayocytes; bcr-abl-Neg;Jak-2 NEG; AUG 2017- CALR/MPL-NEG   # March 2018- Pancreatic head mass/ incidental- 1.6 cm mass. MRI- not suspicious of malignant potential at this time. Monitor in 1 year  # previous TIA      Essential thrombocytosis ([place])    Chief Complaint: Essential thrombocytosis/pancreatic head mass  History of present illness:Jimmy Jimenez 85 y.o.  male with history of essential thrombocytosis on Hydrea; and also history of pancreatic head mass not biopsy is here for follow-up.  Patient's appetite is good.  Denies any significant weight loss.  Denies abdominal pain.  No diarrhea.  No rash.  Observation/objective:  Assessment and plan: Essential thrombocytosis ([place]) #Pancreatic cyst-tail the pancreas slowly growing- MRI OCT 2019- 3.2 cm.  Patient declined EUS.  Continue surveillance clinically asymptomatic.  Awaiting CA 19-today.   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  Platelets slightly elevated 417; overall stable.  Normal hemoglobin.  # Elevated Blood pressure-stable as per patient.  # weight loss-~currently being around 150  pounds.  Stable.   # DISPOSITION: # follow up in 4 months [pt pref]-MD/labs- cbc/cmp/ca-19-9; Dr.B    Follow-up instructions:  I discussed the assessment and treatment plan with the patient.  The patient was provided an opportunity to ask questions and all were answered.  The patient agreed with the plan and demonstrated understanding of instructions.  The patient was advised to call back or seek an in person evaluation if the symptoms worsen or if the condition fails to improve as anticipated.  I provided 12 minutes of non face-to-face telephone visit time during this encounter, and > 50% was spent counseling as documented under my assessment & plan.   Dr. Charlaine Dalton [facility] at [facility] 07/27/2018 12:30 PM

## 2018-07-27 NOTE — Progress Notes (Signed)
Nutrition Follow-up:  RD working remotely.  Patient with thrombocytosis and slow growing pancreatic mass/cyst.    Spoke with patient via phone for nutrition follow-up.  Patient reports appetite is good.  Continues to eat 3 meals per day.  Breakfast is usually cereal, lunch is bean (pork and beans) and greens or greens and potatoes.  Supper is sandwich (pimento cheese). Likes peanut butter and eggs.  Drinks boost original 1 time per day.    Denies any nutrition impact symptoms.   Medications: reviewed  Labs: reviewed  Anthropometrics:   Last recorded weight in chart on 2/5 of 151 lb (Dr. Lupita Dawn office).  Patient does not have scale at home.  Weight at last RD visit on 02/13/18 145 lb   NUTRITION DIAGNOSIS: Inadequate oral intake improved as weight has increased   INTERVENTION:  Encouraged patient to try boost plus or ensure plus (higher calories).  If can tolerate increase to 2 times per day vs 1. Encouraged eating protein source at every meal.  Provided examples of high protein foods.   Contact information provided    MONITORING, EVALUATION, GOAL: Patient will consume adequate calories and protein to maintain weight.     NEXT VISIT: patient to contact as needed  Viktor Philipp B. Zenia Resides, [place], [place] Registered Dietitian [phone] (pager)

## 2018-07-28 LAB — CANCER ANTIGEN 19-9: CA 19-9: 56 U/mL — ABNORMAL HIGH (ref 0–35)

## 2018-08-02 NOTE — Progress Notes (Signed)
  I have reviewed the above information and agree with above.   Kaileen Bronkema, MD 

## 2018-08-03 ENCOUNTER — Ambulatory Visit (INDEPENDENT_AMBULATORY_CARE_PROVIDER_SITE_OTHER): Payer: PPO

## 2018-08-03 ENCOUNTER — Other Ambulatory Visit: Payer: Self-pay

## 2018-08-03 DIAGNOSIS — E349 Endocrine disorder, unspecified: Secondary | ICD-10-CM | POA: Diagnosis not present

## 2018-08-03 NOTE — Progress Notes (Signed)
Jimmy Jimenez presents today for injection per MD orders. Testosterone injection  administered IM in left Gluteal. Administration without incident. Patient tolerated well.  Nitasha Jewel,cma

## 2018-08-07 ENCOUNTER — Other Ambulatory Visit: Payer: Self-pay | Admitting: Internal Medicine

## 2018-08-07 NOTE — Telephone Encounter (Signed)
CBC with Differential/Platelet  Order: [id number]  Status:  Final result  Visible to patient:  No (Not Released)  Next appt:  08/17/2018 at 10:00 AM in [facility])  Dx:  Mass of pancreas   Ref Range & Units 11d ago (07/27/18) 7mo ago (02/06/18) 84mo ago (12/20/17) 33mo ago (09/13/17) 11mo ago (08/26/17) 44yr ago (06/13/17) 68yr ago (03/16/17)  WBC 4.0 - 10.5 K/uL 7.9  8.0  8.1  11.5High  R 7.8 R 10.7High  R 8.4 R  RBC 4.22 - 5.81 MIL/uL 4.14Low   4.40  4.56  4.03Low  R 4.43 R 5.04 R 4.37Low  R  Hemoglobin 13.0 - 17.0 g/dL 13.7  14.5  14.5  13.6 R 14.8 R 16.2 R 13.9 R  HCT 39.0 - 52.0 % 41.2  43.5  43.3  40.1 R 43.3 R 47.1 R 41.5 R  MCV 80.0 - 100.0 fL 99.5  98.9  95.0  99.4  97.7  93.5  95.0   MCH 26.0 - 34.0 pg 33.1  33.0  31.8  33.8  33.5  32.1  31.8   MCHC 30.0 - 36.0 g/dL 33.3  33.3  33.5  34.0 R 34.3 R 34.3 R 33.4 R  RDW 11.5 - 15.5 % 11.9  12.6  12.8  14.7High  R 14.8High  R 13.1 R 13.0 R  Platelets 150 - 400 K/uL 417High   364  382  437 R 561High  R, CM 505High  R 479High  R  nRBC 0.0 - 0.2 % 0.0  0.0  0.0       Neutrophils Relative % % 72  69  69  75   74  64   Neutro Abs 1.7 - 7.7 K/uL 5.8  5.5  5.6  8.7High  R  7.9High  R 5.5 R  Lymphocytes Relative % 16  15  19  13   14  21    Lymphs Abs 0.7 - 4.0 K/uL 1.3  1.2  1.5  1.4 R  1.5 R 1.7 R  Monocytes Relative % 9  12  9  9   9  12    Monocytes Absolute 0.1 - 1.0 K/uL 0.7  0.9  0.7  1.0 R  1.0 R 1.0 R  Eosinophils Relative % 1  3  2  2   2  2    Eosinophils Absolute 0.0 - 0.5 K/uL 0.1  0.2  0.2  0.2 R  0.2 R 0.1 R  Basophils Relative % 1  1  1  1   1  1    Basophils Absolute 0.0 - 0.1 K/uL 0.1  0.1  0.1  0.1 R, CM  0.1 R, CM 0.1 R, CM  Immature Granulocytes % 1  0  0       Abs Immature Granulocytes 0.00 - 0.07 K/uL 0.05  0.03 CM 0.03 CM      Comment: Performed at [facility], [address] [place], [place] [address]  Resulting Agency  [facility] [place] [facility] [place] [facility] [place] [facility] [place] [facility] [place] [facility]  [place] [facility]      Specimen Collected: 07/27/18 10:46  Last Resulted: 07/27/18 10:56

## 2018-08-17 ENCOUNTER — Telehealth: Payer: Self-pay | Admitting: Internal Medicine

## 2018-08-17 ENCOUNTER — Ambulatory Visit: Payer: PPO

## 2018-08-17 ENCOUNTER — Other Ambulatory Visit: Payer: Self-pay | Admitting: Internal Medicine

## 2018-08-17 ENCOUNTER — Other Ambulatory Visit: Payer: Self-pay

## 2018-08-17 ENCOUNTER — Ambulatory Visit (INDEPENDENT_AMBULATORY_CARE_PROVIDER_SITE_OTHER): Payer: PPO

## 2018-08-17 DIAGNOSIS — E538 Deficiency of other specified B group vitamins: Secondary | ICD-10-CM

## 2018-08-17 DIAGNOSIS — E349 Endocrine disorder, unspecified: Secondary | ICD-10-CM | POA: Diagnosis not present

## 2018-08-17 MED ORDER — CYANOCOBALAMIN 1000 MCG/ML IJ SOLN
1000.0000 ug | Freq: Once | INTRAMUSCULAR | Status: AC
  Administered 2018-08-17: 1000 ug via INTRAMUSCULAR

## 2018-08-17 MED ORDER — TESTOSTERONE CYPIONATE 200 MG/ML IM SOLN: INTRAMUSCULAR | 3 refills | Status: DC

## 2018-08-17 MED ORDER — TESTOSTERONE CYPIONATE 200 MG/ML IM SOLN
50.0000 mg | INTRAMUSCULAR | Status: DC
  Administered 2018-08-17 – 2018-08-31 (×2): 50 mg via INTRAMUSCULAR

## 2018-08-17 NOTE — Progress Notes (Addendum)
Patient came in today for testosterone injection in right gluteal. Patient tolerated well. Patient also received B-12 injection in right deltoid. Patient also tolerated well with no signs of distress.    Cosigned  Regards,   Deborra Medina, MD

## 2018-08-31 ENCOUNTER — Ambulatory Visit (INDEPENDENT_AMBULATORY_CARE_PROVIDER_SITE_OTHER): Payer: PPO

## 2018-08-31 ENCOUNTER — Other Ambulatory Visit: Payer: Self-pay

## 2018-08-31 DIAGNOSIS — E349 Endocrine disorder, unspecified: Secondary | ICD-10-CM

## 2018-08-31 MED ORDER — TESTOSTERONE CYPIONATE 200 MG/ML IM SOLN: 50.0000 mg | INTRAMUSCULAR | 0 refills | Status: DC

## 2018-08-31 NOTE — Progress Notes (Signed)
Patient presented today for testosterone injection.  Administered IM in outer upper left gluteal.  Administered without incident.  Patient tolerated well with no signs of distress.

## 2018-09-14 ENCOUNTER — Other Ambulatory Visit: Payer: Self-pay

## 2018-09-14 ENCOUNTER — Ambulatory Visit (INDEPENDENT_AMBULATORY_CARE_PROVIDER_SITE_OTHER): Payer: PPO

## 2018-09-14 DIAGNOSIS — E538 Deficiency of other specified B group vitamins: Secondary | ICD-10-CM | POA: Diagnosis not present

## 2018-09-14 DIAGNOSIS — E349 Endocrine disorder, unspecified: Secondary | ICD-10-CM

## 2018-09-14 MED ORDER — CYANOCOBALAMIN 1000 MCG/ML IJ SOLN
1000.0000 ug | Freq: Once | INTRAMUSCULAR | Status: AC
  Administered 2018-09-14: 1000 ug via INTRAMUSCULAR

## 2018-09-14 MED ORDER — TESTOSTERONE CYPIONATE 200 MG/ML IM SOLN
50.0000 mg | Freq: Once | INTRAMUSCULAR | Status: AC
  Administered 2018-09-14: 50 mg via INTRAMUSCULAR

## 2018-09-14 NOTE — Progress Notes (Signed)
Patient presented today for B-12 injection.  Administered IM in left deltoid.  Patient tolerated well with no signs of distress.  Patient also presented today for testosterone injection.  Administered IM in right upper outer gluteal.  Patient tolerated well with no signs of distress.

## 2018-09-15 ENCOUNTER — Other Ambulatory Visit: Payer: Self-pay | Admitting: Internal Medicine

## 2018-09-15 NOTE — Telephone Encounter (Signed)
Continue lisinopril,  Documented intolerance to telmisartan so no switch is planned

## 2018-09-15 NOTE — Telephone Encounter (Signed)
Refill request for lisinopril.

## 2018-09-20 ENCOUNTER — Telehealth: Payer: Self-pay | Admitting: Internal Medicine

## 2018-09-20 NOTE — Telephone Encounter (Signed)
Spoke with pt and he stated that he was walking from the car to the house yesterday and all of a sudden he had a feeling of a "bear" sitting on his chest. The pt stated that it only lasted about two seconds and was gone. He stated that he did not have any arm pain, neck pain, no SOBr, did not feel gassy. Would you want pt to come in the office for a visit and EKG or a telephone visit?

## 2018-09-20 NOTE — Telephone Encounter (Signed)
Spoke with pt's wife and she stated that the pt has another appt that day at 11am. She stated that she would talk with her husband and see what he wants to do and give [place] a call back.

## 2018-09-20 NOTE — Telephone Encounter (Signed)
Pt wife stated that pt would like an in-office appt with Dr. Derrel Nip he had a one time episode of his chest feeling like a bear was on it. Please advise

## 2018-09-20 NOTE — Telephone Encounter (Signed)
Pt's wife was in the office and she stated that she would like for me to give her time to get home before I called the pt.

## 2018-09-20 NOTE — Telephone Encounter (Signed)
He needs an actual visit so he can have a chest x ray ,  ekg and labs.  Tuesday 12:30  If he has another episode that lasts longer than 2 sec,  He should call 911 or go to ER  Needs ekg  When he gets here

## 2018-09-28 ENCOUNTER — Ambulatory Visit (INDEPENDENT_AMBULATORY_CARE_PROVIDER_SITE_OTHER): Payer: PPO

## 2018-09-28 ENCOUNTER — Encounter: Payer: Self-pay | Admitting: Internal Medicine

## 2018-09-28 ENCOUNTER — Ambulatory Visit: Payer: PPO | Admitting: Internal Medicine

## 2018-09-28 ENCOUNTER — Other Ambulatory Visit: Payer: Self-pay

## 2018-09-28 ENCOUNTER — Ambulatory Visit: Payer: PPO

## 2018-09-28 ENCOUNTER — Ambulatory Visit (INDEPENDENT_AMBULATORY_CARE_PROVIDER_SITE_OTHER): Payer: PPO | Admitting: Internal Medicine

## 2018-09-28 VITALS — BP 162/64 | HR 83 | Temp 97.7°F | Resp 17 | Ht 68.0 in | Wt 150.2 lb

## 2018-09-28 DIAGNOSIS — R0782 Intercostal pain: Secondary | ICD-10-CM | POA: Diagnosis not present

## 2018-09-28 DIAGNOSIS — R0789 Other chest pain: Secondary | ICD-10-CM

## 2018-09-28 DIAGNOSIS — R634 Abnormal weight loss: Secondary | ICD-10-CM | POA: Diagnosis not present

## 2018-09-28 DIAGNOSIS — E291 Testicular hypofunction: Secondary | ICD-10-CM | POA: Diagnosis not present

## 2018-09-28 DIAGNOSIS — I10 Essential (primary) hypertension: Secondary | ICD-10-CM | POA: Diagnosis not present

## 2018-09-28 LAB — COMPREHENSIVE METABOLIC PANEL
ALT: 11 U/L (ref 0–53)
AST: 18 U/L (ref 0–37)
Albumin: 4.6 g/dL (ref 3.5–5.2)
Alkaline Phosphatase: 79 U/L (ref 39–117)
BUN: 13 mg/dL (ref 6–23)
CO2: 32 mEq/L (ref 19–32)
Calcium: 9.8 mg/dL (ref 8.4–10.5)
Chloride: 93 mEq/L — ABNORMAL LOW (ref 96–112)
Creatinine, Ser: 0.93 mg/dL (ref 0.40–1.50)
GFR: 92.04 mL/min (ref >60.00)
Glucose, Bld: 92 mg/dL (ref 70–99)
Potassium: 4.1 mEq/L (ref 3.5–5.1)
Sodium: 133 mEq/L — ABNORMAL LOW (ref 135–145)
Total Bilirubin: 0.8 mg/dL (ref 0.2–1.2)
Total Protein: 6.7 g/dL (ref 6.0–8.3)

## 2018-09-28 LAB — MAGNESIUM: Magnesium: 2 mg/dL (ref 1.5–2.5)

## 2018-09-28 LAB — TSH: TSH: 1.8 u[IU]/mL (ref 0.35–4.50)

## 2018-09-28 LAB — MICROALBUMIN / CREATININE URINE RATIO
Creatinine,U: 118.9 mg/dL
Microalb Creat Ratio: 4.4 mg/g (ref 0.0–30.0)
Microalb, Ur: 5.2 mg/dL — ABNORMAL HIGH (ref 0.0–1.9)

## 2018-09-28 MED ORDER — ESOMEPRAZOLE MAGNESIUM 40 MG PO CPDR: 40.0000 mg | DELAYED_RELEASE_CAPSULE | Freq: Every day | ORAL | 2 refills | Status: DC

## 2018-09-28 MED ORDER — CLOTRIMAZOLE-BETAMETHASONE 1-0.05 % EX CREA: TOPICAL_CREAM | Freq: Two times a day (BID) | CUTANEOUS | 5 refills | Status: DC

## 2018-09-28 MED ORDER — TESTOSTERONE CYPIONATE 200 MG/ML IM SOLN
200.0000 mg | Freq: Once | INTRAMUSCULAR | Status: AC
  Administered 2018-09-28: 200 mg via INTRAMUSCULAR

## 2018-09-28 NOTE — Patient Instructions (Addendum)
Your EKG is unchanged compared to the one done in the ED last summer  We'll complete the workup with a chest  X ray and labs    I want you to start taking a medication daily for reflux  called Nexium. Take your Nexium once daily in the morning,  Before you have  breakfast   I  Have refilled your antifungal cream

## 2018-09-28 NOTE — Progress Notes (Signed)
Subjective:  Patient ID: Jimmy Jimenez, male    DOB: 03/13/1925  Age: 85 y.o. MRN: [id number]  CC: The primary encounter diagnosis was Abnormal weight loss. Diagnoses of Chest tightness, Essential hypertension, Testicular insufficiency, and Intercostal pain were also pertinent to this visit.  HPI Jimmy Jimenez  Is a 85 yr old male with a history of hypertension,  Essential thrombocytosis,  low testosterone receiving supplementation, B12 deficiency , Suspected pancreatic neoplasm under  Surveillance with serial CTs (last one Oct 2019 noting enlargement of cystic mass in head of pancreas).  presents for evaluation of recent episode of chest pain  that occurred several days ago.  He described as "a bear sitting on my chest.'  The episode lasted about 2 sec and occurred while he was getting out of his car.  It has not recurred  Despite being similarly active.   Has chronic indigestion which he manages with Phazyme,  No PPI   Last known Cardiac evaluations include ECHO in 2014: normal EF, mild LVH, severely increased pulmonary artery pressure (estimated at 70  Mm Hg) , moderate  MR,   Mild AR and TR    Home BPs have 748 to 270 systolic.  Weight stable and appetite good   Lab Results  Component Value Date   WBC 7.9 07/27/2018   HGB 13.7 07/27/2018   HCT 41.2 07/27/2018   MCV 99.5 07/27/2018   PLT 417 (H) 07/27/2018     Outpatient Medications Prior to Visit  Medication Sig Dispense Refill  . amLODipine (NORVASC) 5 MG tablet TAKE 1 TABLET BY MOUTH ONCE DAILY 90 tablet 1  . aspirin EC 81 MG tablet Take 81 mg by mouth daily. Reported on 08/14/2015    . Calcium Citrate-Vitamin D (CITRACAL + D PO) Take 3 tablets by mouth daily. Take 2 by mouth daily, calcium 630 and vitamin D 500 each.    . Cholecalciferol (VITAMIN D3) 1000 UNITS CAPS Take 2,000 Units by mouth daily. Take 2 by mouth daily    . Cyanocobalamin (B-12 COMPLIANCE INJECTION) 1000 MCG/ML KIT Inject as directed.    . docusate sodium  (COLACE) 100 MG capsule Take 100 mg by mouth 2 (two) times daily as needed for mild constipation.    . furosemide (LASIX) 20 MG tablet TAKE 1 TABLET BY MOUTH EVERY OTHER DAY IN MORNING 45 tablet 0  . hydrALAZINE (APRESOLINE) 50 MG tablet TAKE 1 TABLET BY MOUTH 3 TIMES DAILY 90 tablet 1  . hydroxyurea (HYDREA) 500 MG capsule TAKE 1 CAPSULE BY MOUTH EVERY OTHER DAY MAY TAKE WITH FOOD TO MINIMIZE GI SIDE EFFECTS. 60 capsule 3  . lisinopril (ZESTRIL) 40 MG tablet TAKE 1 TABLET BY MOUTH ONCE DAILY 90 tablet 1  . clotrimazole-betamethasone (LOTRISONE) cream     . ipratropium (ATROVENT) 0.03 % nasal spray Place 2 sprays into both nostrils every 12 (twelve) hours. (Patient not taking: Reported on 09/28/2018) 30 mL 0   No facility-administered medications prior to visit.     Review of Systems;  Patient denies headache, fevers, malaise, unintentional weight loss, skin rash, eye pain, sinus congestion and sinus pain, sore throat, dysphagia,  hemoptysis , cough, dyspnea, wheezing, chest pain, palpitations, orthopnea, edema, abdominal pain, nausea, melena, diarrhea, constipation, flank pain, dysuria, hematuria, urinary  Frequency, nocturia, numbness, tingling, seizures,  Focal weakness, Loss of consciousness,  Tremor, insomnia, depression, anxiety, and suicidal ideation.      Objective:  BP (!) 162/64 (BP Location: Left Arm, Patient Position: Sitting, Cuff Size: Normal)  Pulse 83   Temp 97.7 F (36.5 C) (Oral)   Resp 17   Ht 5' 8"  (1.727 m)   Wt 150 lb 3.2 oz (68.1 kg)   SpO2 94%   BMI 22.84 kg/m   BP Readings from Last 3 Encounters:  09/28/18 (!) 162/64  04/05/18 122/78  03/10/18 140/61    Wt Readings from Last 3 Encounters:  09/28/18 150 lb 3.2 oz (68.1 kg)  04/05/18 151 lb (68.5 kg)  03/10/18 146 lb (66.2 kg)    General appearance: alert, cooperative and appears stated age Ears: normal TM's and external ear canals both ears Throat: lips, mucosa, and tongue normal; teeth and gums  normal Neck: no adenopathy, no carotid bruit, supple, symmetrical, trachea midline and thyroid not enlarged, symmetric, no tenderness/mass/nodules Back: symmetric, no curvature. ROM normal. No CVA tenderness. Lungs: clear to auscultation bilaterally Heart: regular rate and rhythm, S1, S2 normal, no murmur, click, rub or gallop Abdomen: soft, non-tender; bowel sounds normal; no masses,  no organomegaly Pulses: 2+ and symmetric Skin: Skin color, texture, turgor normal. No rashes or lesions Lymph nodes: Cervical, supraclavicular, and axillary nodes normal.  No results found for: HGBA1C  Lab Results  Component Value Date   CREATININE 0.93 09/28/2018   CREATININE 1.01 07/27/2018   CREATININE 1.15 02/06/2018    Lab Results  Component Value Date   WBC 7.9 07/27/2018   HGB 13.7 07/27/2018   HCT 41.2 07/27/2018   PLT 417 (H) 07/27/2018   GLUCOSE 92 09/28/2018   LDLDIRECT 103.7 09/26/2013   ALT 11 09/28/2018   AST 18 09/28/2018   NA 133 (L) 09/28/2018   K 4.1 09/28/2018   CL 93 (L) 09/28/2018   CREATININE 0.93 09/28/2018   BUN 13 09/28/2018   CO2 32 09/28/2018   TSH 1.80 09/28/2018   PSA 0.88 08/19/2014   INR 1.02 08/31/2016   MICROALBUR 5.2 (H) 09/28/2018    Mr Abdomen W Wo Contrast  Result Date: 12/20/2017 CLINICAL DATA:  Cystic lesion head of the pancreas. Six-month follow-up. EXAM: MRI ABDOMEN WITHOUT AND WITH CONTRAST TECHNIQUE: Multiplanar multisequence MR imaging of the abdomen was performed both before and after the administration of intravenous contrast. CONTRAST:  7 mL Gadavist COMPARISON:  None. FINDINGS: Lower chest:  Lung bases are clear. Hepatobiliary: No focal hepatic lesion. Gallbladder normal. Common bile duct appears normal. Pancreas: Cystic lesion in the head of pancreas has increase in size measuring 3.4 x 2.1 cm in axial dimension (image 18/5) compared to 2.2 x 1.2 cm on MRI 6 months prior. Cystic lesion is multilobular. No clear nodularity the multi cystic  complex. Lesion is in the uncinate region of the pancreas. There is no clear communication with the pancreatic duct. Postcontrast imaging demonstrates no enhancement or nodularity. No pancreatic duct dilatation. No pancreatic inflammation Spleen: Normal spleen. Adrenals/urinary tract: Adrenal glands and kidneys are normal. Stomach/Bowel: Stomach and limited of the small bowel is unremarkable Vascular/Lymphatic: Abdominal is normal. No abdominal lymphadenopathy Musculoskeletal: Enhancing lesion in the T10 vertebral body and pedicle of L2 consistent benign hemangiomas when compared to comparison CT. IMPRESSION: 1. Enlarging multi-cystic lesion within the uncinate region of the pancreas. Recommend endoscopic ultrasound with tissue sampling/aspiration to evaluate for cystic neoplasm. 2. No hepatobiliary abnormality. 3. Benign hemangiomas in the spine. Electronically Signed   By: Suzy Bouchard M.D.   On: 12/20/2017 14:02    Assessment & Plan:   Problem List Items Addressed This Visit      Unprioritized   Chest pain  Etiology unclear,  I have reviewed today;s EKG and compared it to prior tracings AND see no evidence of ischemic changes .  Chest x ray is normal as well.         Hypertension   Relevant Orders   Comprehensive metabolic panel (Completed)   Microalbumin / creatinine urine ratio (Completed)   Abnormal weight loss - Primary   Relevant Orders   Magnesium (Completed)   TSH (Completed)   Testicular insufficiency    Other Visit Diagnoses    Chest tightness       Relevant Orders   EKG 12-Lead (Completed)   DG Chest 2 View (Completed)   CK total and CKMB (cardiac)not at [facility] (Completed)      I have discontinued [place] Girardot's ipratropium. I have also changed his clotrimazole-betamethasone. Additionally, I am having him start on esomeprazole. Lastly, I am having him maintain his Calcium Citrate-Vitamin D (CITRACAL + D PO), Vitamin D3, aspirin EC, docusate sodium, Cyanocobalamin,  amLODipine, hydroxyurea, hydrALAZINE, lisinopril, and furosemide. We administered testosterone cypionate.  Meds ordered this encounter  Medications  . esomeprazole (NEXIUM) 40 MG capsule    Sig: Take 1 capsule (40 mg total) by mouth daily at 12 noon.    Dispense:  30 capsule    Refill:  2  . clotrimazole-betamethasone (LOTRISONE) cream    Sig: Apply topically 2 (two) times daily.    Dispense:  30 g    Refill:  5  . testosterone cypionate (DEPOTESTOSTERONE CYPIONATE) injection 200 mg    Medications Discontinued During This Encounter  Medication Reason  . ipratropium (ATROVENT) 0.03 % nasal spray Patient Preference  . clotrimazole-betamethasone (LOTRISONE) cream Reorder    Follow-up: No follow-ups on file.   Crecencio Mc, MD

## 2018-09-29 LAB — CK TOTAL AND CKMB (NOT AT ARMC)
CK, MB: 1 ng/mL (ref 0–5.0)
Relative Index: 3.2 (ref 0–4.0)
Total CK: 31 U/L — ABNORMAL LOW (ref 44–196)

## 2018-09-30 DIAGNOSIS — R079 Chest pain, unspecified: Secondary | ICD-10-CM | POA: Insufficient documentation

## 2018-09-30 NOTE — Assessment & Plan Note (Addendum)
Etiology unclear,  I have reviewed today;s EKG and compared it to prior tracings AND see no evidence of ischemic changes .  Chest x ray is normal as well.

## 2018-10-12 ENCOUNTER — Other Ambulatory Visit: Payer: Self-pay

## 2018-10-12 ENCOUNTER — Ambulatory Visit (INDEPENDENT_AMBULATORY_CARE_PROVIDER_SITE_OTHER): Payer: PPO

## 2018-10-12 DIAGNOSIS — E349 Endocrine disorder, unspecified: Secondary | ICD-10-CM

## 2018-10-12 DIAGNOSIS — E538 Deficiency of other specified B group vitamins: Secondary | ICD-10-CM

## 2018-10-17 MED ORDER — CYANOCOBALAMIN 1000 MCG/ML IJ SOLN
1000.0000 ug | Freq: Once | INTRAMUSCULAR | Status: AC
  Administered 2018-10-12: 11:00:00 1000 ug via INTRAMUSCULAR

## 2018-10-17 MED ORDER — TESTOSTERONE CYPIONATE 200 MG/ML IM SOLN
50.0000 mg | INTRAMUSCULAR | Status: DC
  Administered 2018-10-12: 11:00:00 50 mg via INTRAMUSCULAR

## 2018-10-17 NOTE — Progress Notes (Signed)
Patient presented today for B12 injection.  Administered IM in right deltoid.  Patient tolerated well with no signs of distress.  Patient presented today for testosterone injection.  Administered IM in the left upper outer gluteal.  Patient tolerated well with no signs of distress.

## 2018-10-23 ENCOUNTER — Other Ambulatory Visit: Payer: Self-pay | Admitting: Internal Medicine

## 2018-10-23 NOTE — Telephone Encounter (Signed)
Refilled: 09/28/2018 Last OV: 09/28/2018 Next OV: not scheduled

## 2018-10-26 ENCOUNTER — Other Ambulatory Visit: Payer: Self-pay

## 2018-10-26 ENCOUNTER — Ambulatory Visit (INDEPENDENT_AMBULATORY_CARE_PROVIDER_SITE_OTHER): Payer: PPO

## 2018-10-26 DIAGNOSIS — E349 Endocrine disorder, unspecified: Secondary | ICD-10-CM | POA: Diagnosis not present

## 2018-10-27 ENCOUNTER — Encounter: Payer: Self-pay | Admitting: Internal Medicine

## 2018-10-27 ENCOUNTER — Ambulatory Visit (INDEPENDENT_AMBULATORY_CARE_PROVIDER_SITE_OTHER): Payer: PPO | Admitting: Internal Medicine

## 2018-10-27 DIAGNOSIS — R05 Cough: Secondary | ICD-10-CM

## 2018-10-27 DIAGNOSIS — R058 Other specified cough: Secondary | ICD-10-CM

## 2018-10-27 NOTE — Progress Notes (Signed)
Telephone Note  This visit type was conducted due to national recommendations for restrictions regarding the COVID-19 pandemic (e.g. social distancing).  This format is felt to be most appropriate for this patient at this time.  All issues noted in this document were discussed and addressed.  No physical exam was performed (except for noted visual exam findings with Video Visits).   I connected with@ on 10/27/18 at  3:00 PM EDT by a video enabled telemedicine application or telephone and verified that I am speaking with the correct person using two identifiers. Location patient: home Location provider: work or home office Persons participating in the virtual visit: patient, provider  I discussed the limitations, risks, security and privacy concerns of performing an evaluation and management service by telephone and the availability of in person appointments. I also discussed with the patient that there may be a patient responsible charge related to this service. The patient expressed understanding and agreed to proceed.   Reason for visit: "cold"  HPI:   85 yr old with  Chronic rhinitis and recent findings of interstitial fibrosis by chest x ray in July 2020.  states that for the  past month, he has had his usual runny nose and a morning cough that is semi productive.  "I get a little up" but I can't get it all" .  Does not feel poorly,  Denies  fever, headache, facial/sinus pain, wheezing ,  Sneezing and body aches  .  Feels he is having difficulty clearing secretions in his chest .  Not short of breath with supine position , cough is not aggravated by lying down.   The patient has no signs or symptoms of COVID 19 infection (fever, cough, sore throat  or shortness of breath beyond what is typical for patient).  Patient denies contact with other persons with the above mentioned symptoms or with anyone confirmed to have COVID 19 .  He has not left the house in weeks. He has had no visitors .      ROS: Patient denies headache, fevers, malaise, unintentional weight loss, skin rash, eye pain, sinus congestion and sinus pain, sore throat, dysphagia,  hemoptysis ,  dyspnea, wheezing, chest pain, palpitations, orthopnea, edema, abdominal pain, nausea, melena, diarrhea, constipation, flank pain, dysuria, hematuria, urinary  Frequency, nocturia, numbness, tingling, seizures,  Focal weakness, Loss of consciousness,  Tremor, insomnia, depression, anxiety, and suicidal ideation.      Past Medical History:  Diagnosis Date  . Colon polyps   . Essential thrombocytosis ([place])   . Essential thrombocytosis ([place])   . Hyperlipidemia   . Hypertension   . Lung nodule 12/2004   found on CXR  . Osteoporosis     Past Surgical History:  Procedure Laterality Date  . BONE MARROW BIOPSY  01/18/07  . COLONOSCOPY  2007, 2010   Dr Allen Norris  . NASAL SINUS SURGERY  06/11/05    Family History  Problem Relation Age of Onset  . Colon cancer Mother        colon age 9's  . Peripheral vascular disease Father   . Colon cancer Brother        colon age 63's  . Diabetes Other   . Prostate cancer Neg Hx   . Bladder Cancer Neg Hx   . Kidney cancer Neg Hx     SOCIAL HX:  Social History   Socioeconomic History  . Marital status: Married    Spouse name: Not on file  . Number of children: Not  on file  . Years of education: Not on file  . Highest education level: Not on file  Occupational History  . Occupation: Retired    Comment: former Nature conservation officer, Lawyer  . Financial resource strain: Not on file  . Food insecurity    Worry: Not on file    Inability: Not on file  . Transportation needs    Medical: Not on file    Non-medical: Not on file  Tobacco Use  . Smoking status: Never Smoker  . Smokeless tobacco: Never Used  Substance and Sexual Activity  . Alcohol use: No  . Drug use: No  . Sexual activity: Not Currently  Lifestyle  . Physical activity    Days per week: Not on file     Minutes per session: Not on file  . Stress: Not on file  Relationships  . Social Herbalist on phone: Not on file    Gets together: Not on file    Attends religious service: Not on file    Active member of club or organization: Not on file    Attends meetings of clubs or organizations: Not on file    Relationship status: Not on file  . Intimate partner violence    Fear of current or ex partner: Not on file    Emotionally abused: Not on file    Physically abused: Not on file    Forced sexual activity: Not on file  Other Topics Concern  . Not on file  Social History Narrative  . Not on file     Current Outpatient Medications:  .  amLODipine (NORVASC) 5 MG tablet, TAKE 1 TABLET BY MOUTH ONCE DAILY, Disp: 90 tablet, Rfl: 1 .  aspirin EC 81 MG tablet, Take 81 mg by mouth daily. Reported on 08/14/2015, Disp: , Rfl:  .  Calcium Citrate-Vitamin D (CITRACAL + D PO), Take 3 tablets by mouth daily. Take 2 by mouth daily, calcium 630 and vitamin D 500 each., Disp: , Rfl:  .  Cholecalciferol (VITAMIN D3) 1000 UNITS CAPS, Take 2,000 Units by mouth daily. Take 2 by mouth daily, Disp: , Rfl:  .  clotrimazole-betamethasone (LOTRISONE) cream, APPLY TOPICALLY TWICE DAILY, Disp: 45 g, Rfl: 2 .  Cyanocobalamin (B-12 COMPLIANCE INJECTION) 1000 MCG/ML KIT, Inject as directed., Disp: , Rfl:  .  docusate sodium (COLACE) 100 MG capsule, Take 100 mg by mouth 2 (two) times daily as needed for mild constipation., Disp: , Rfl:  .  esomeprazole (NEXIUM) 40 MG capsule, Take 1 capsule (40 mg total) by mouth daily at 12 noon., Disp: 30 capsule, Rfl: 2 .  furosemide (LASIX) 20 MG tablet, TAKE 1 TABLET BY MOUTH EVERY OTHER DAY IN MORNING, Disp: 45 tablet, Rfl: 0 .  hydrALAZINE (APRESOLINE) 50 MG tablet, TAKE 1 TABLET BY MOUTH 3 TIMES DAILY, Disp: 90 tablet, Rfl: 1 .  hydroxyurea (HYDREA) 500 MG capsule, TAKE 1 CAPSULE BY MOUTH EVERY OTHER DAY MAY TAKE WITH FOOD TO MINIMIZE GI SIDE EFFECTS., Disp: 60 capsule,  Rfl: 3 .  lisinopril (ZESTRIL) 40 MG tablet, TAKE 1 TABLET BY MOUTH ONCE DAILY, Disp: 90 tablet, Rfl: 1 .  testosterone cypionate (DEPOTESTOSTERONE CYPIONATE) 200 MG/ML injection, , Disp: , Rfl:   Current Facility-Administered Medications:  .  testosterone cypionate (DEPOTESTOSTERONE CYPIONATE) injection 50 mg, 50 mg, Intramuscular, Q14 Days, Crecencio Mc, MD, 50 mg at 10/12/18 1040  EXAM:   General impression: alert, cooperative and articulate.  No signs of being in  distress  Lungs: speech is fluent sentence length suggests that patient is not short of breath and not punctuated by cough, sneezing or sniffing. [name]   Psych: affect normal.  speech is articulate and non pressured .  Denies suicidal thoughts    ASSESSMENT AND PLAN:  Cough present for greater than 3 weeks In the absence of infectious symptoms,  Etiology includes pulmonary fibrosis, (seen on July 2020 x ray)  d ACE inhibitor induced cough,  And post nasal drip .  Cough is not present throughout the day, only in the morning .  Will have patient resume daily sinus irrigation with Neilmed sinus rinse to relieve any dried mucus at back of throat. If no improvement,  Will consider medication change (d/c lisinopril)    I discussed the assessment and treatment plan with the patient. The patient was provided an opportunity to ask questions and all were answered. The patient agreed with the plan and demonstrated an understanding of the instructions.   The patient was advised to call back or seek an in-person evaluation if the symptoms worsen or if the condition fails to improve as anticipated.  I provided 15 minutes of non-face-to-face time during this encounter reviewing patient's previous chest x rays and advising patient on treatment of cough, avoidance of COVID 19.   Crecencio Mc, MD

## 2018-10-27 NOTE — Assessment & Plan Note (Signed)
In the absence of infectious symptoms,  Etiology includes pulmonary fibrosis, (seen on July 2020 x ray)  d ACE inhibitor induced cough,  And post nasal drip .  Cough is not present throughout the day, only in the morning .  Will have patient resume daily sinus irrigation with Neilmed sinus rinse to relieve any dried mucus at back of throat. If no improvement,  Will consider medication change (d/c lisinopril)

## 2018-10-30 MED ORDER — TESTOSTERONE CYPIONATE 200 MG/ML IM SOLN
50.0000 mg | INTRAMUSCULAR | Status: DC
  Administered 2018-10-26: 10:00:00 50 mg via INTRAMUSCULAR

## 2018-10-30 NOTE — Progress Notes (Signed)
Patient presented today for testosterone injection.  Administered IM in right upper outer quadrant of gluteal.  Patient tolerated well with no signs of distress.

## 2018-10-31 ENCOUNTER — Other Ambulatory Visit: Payer: Self-pay | Admitting: Internal Medicine

## 2018-11-08 ENCOUNTER — Other Ambulatory Visit: Payer: Self-pay | Admitting: Internal Medicine

## 2018-11-09 ENCOUNTER — Ambulatory Visit (INDEPENDENT_AMBULATORY_CARE_PROVIDER_SITE_OTHER): Payer: PPO

## 2018-11-09 ENCOUNTER — Other Ambulatory Visit: Payer: Self-pay

## 2018-11-09 DIAGNOSIS — E349 Endocrine disorder, unspecified: Secondary | ICD-10-CM | POA: Diagnosis not present

## 2018-11-09 DIAGNOSIS — E538 Deficiency of other specified B group vitamins: Secondary | ICD-10-CM | POA: Diagnosis not present

## 2018-11-09 MED ORDER — TESTOSTERONE CYPIONATE 200 MG/ML IM SOLN
50.0000 mg | INTRAMUSCULAR | Status: DC
  Administered 2018-11-09 – 2018-12-07 (×3): 50 mg via INTRAMUSCULAR

## 2018-11-09 MED ORDER — CYANOCOBALAMIN 1000 MCG/ML IJ SOLN
1000.0000 ug | Freq: Once | INTRAMUSCULAR | Status: AC
  Administered 2018-11-09: 10:00:00 1000 ug via INTRAMUSCULAR

## 2018-11-09 NOTE — Progress Notes (Signed)
Patient presented today for B12 injection.  Administered IM in left deltoid.  Patient also presented for testosterone injection.  Administered IM in left upper outer quadrant of gluteal.   Patient tolerated well with no signs of distress.

## 2018-11-16 ENCOUNTER — Ambulatory Visit: Payer: PPO

## 2018-11-20 ENCOUNTER — Telehealth: Payer: Self-pay | Admitting: Internal Medicine

## 2018-11-20 ENCOUNTER — Other Ambulatory Visit: Payer: Self-pay

## 2018-11-20 MED ORDER — CLOTRIMAZOLE-BETAMETHASONE 1-0.05 % EX CREA: TOPICAL_CREAM | Freq: Two times a day (BID) | CUTANEOUS | 2 refills | Status: DC

## 2018-11-20 MED ORDER — CLOTRIMAZOLE-BETAMETHASONE 1-0.05 % EX CREA: TOPICAL_CREAM | Freq: Two times a day (BID) | CUTANEOUS | 5 refills | Status: DC

## 2018-11-20 NOTE — Telephone Encounter (Signed)
Script sent with 5 refills.

## 2018-11-20 NOTE — Telephone Encounter (Signed)
Pt wife called in and would like to know if they can get more refills at a time for the clotrimazole-betamethasone (LOTRISONE) cream [id number]  She stated that they are having to get it filled so often.  She would like to get more at a time so they dont have to go so often to get it

## 2018-11-23 ENCOUNTER — Other Ambulatory Visit: Payer: Self-pay

## 2018-11-23 ENCOUNTER — Ambulatory Visit (INDEPENDENT_AMBULATORY_CARE_PROVIDER_SITE_OTHER): Payer: PPO

## 2018-11-23 DIAGNOSIS — E349 Endocrine disorder, unspecified: Secondary | ICD-10-CM | POA: Diagnosis not present

## 2018-11-23 MED ORDER — TESTOSTERONE CYPIONATE 200 MG/ML IM SOLN
50.0000 mg | INTRAMUSCULAR | Status: DC
  Administered 2018-12-07 – 2019-01-04 (×2): 50 mg via INTRAMUSCULAR

## 2018-11-23 NOTE — Progress Notes (Signed)
Patient presented today for testosterone injection.  Administered IM in the right upper outer quadrant of gluteal.  Patient tolerated well with no signs of distress.

## 2018-11-24 ENCOUNTER — Other Ambulatory Visit: Payer: Self-pay | Admitting: Internal Medicine

## 2018-11-27 ENCOUNTER — Other Ambulatory Visit: Payer: Self-pay

## 2018-11-27 ENCOUNTER — Inpatient Hospital Stay: Payer: PPO | Attending: Internal Medicine

## 2018-11-27 ENCOUNTER — Inpatient Hospital Stay (HOSPITAL_BASED_OUTPATIENT_CLINIC_OR_DEPARTMENT_OTHER): Payer: PPO | Admitting: Internal Medicine

## 2018-11-27 DIAGNOSIS — D473 Essential (hemorrhagic) thrombocythemia: Secondary | ICD-10-CM

## 2018-11-27 DIAGNOSIS — K8689 Other specified diseases of pancreas: Secondary | ICD-10-CM

## 2018-11-27 DIAGNOSIS — E785 Hyperlipidemia, unspecified: Secondary | ICD-10-CM | POA: Insufficient documentation

## 2018-11-27 DIAGNOSIS — R634 Abnormal weight loss: Secondary | ICD-10-CM | POA: Diagnosis not present

## 2018-11-27 DIAGNOSIS — K862 Cyst of pancreas: Secondary | ICD-10-CM | POA: Insufficient documentation

## 2018-11-27 DIAGNOSIS — E871 Hypo-osmolality and hyponatremia: Secondary | ICD-10-CM | POA: Diagnosis not present

## 2018-11-27 DIAGNOSIS — Z79899 Other long term (current) drug therapy: Secondary | ICD-10-CM | POA: Diagnosis not present

## 2018-11-27 DIAGNOSIS — R5381 Other malaise: Secondary | ICD-10-CM | POA: Insufficient documentation

## 2018-11-27 DIAGNOSIS — M810 Age-related osteoporosis without current pathological fracture: Secondary | ICD-10-CM | POA: Diagnosis not present

## 2018-11-27 DIAGNOSIS — Z8673 Personal history of transient ischemic attack (TIA), and cerebral infarction without residual deficits: Secondary | ICD-10-CM | POA: Diagnosis not present

## 2018-11-27 DIAGNOSIS — Z7982 Long term (current) use of aspirin: Secondary | ICD-10-CM | POA: Insufficient documentation

## 2018-11-27 DIAGNOSIS — I10 Essential (primary) hypertension: Secondary | ICD-10-CM | POA: Diagnosis not present

## 2018-11-27 DIAGNOSIS — R5383 Other fatigue: Secondary | ICD-10-CM | POA: Diagnosis not present

## 2018-11-27 LAB — CBC WITH DIFFERENTIAL/PLATELET
Abs Immature Granulocytes: 0.11 10*3/uL — ABNORMAL HIGH (ref 0.00–0.07)
Basophils Absolute: 0.1 10*3/uL (ref 0.0–0.1)
Basophils Relative: 1 %
Eosinophils Absolute: 0.1 10*3/uL (ref 0.0–0.5)
Eosinophils Relative: 2 %
HCT: 42.2 % (ref 39.0–52.0)
Hemoglobin: 14.4 g/dL (ref 13.0–17.0)
Immature Granulocytes: 1 %
Lymphocytes Relative: 17 %
Lymphs Abs: 1.6 10*3/uL (ref 0.7–4.0)
MCH: 34 pg (ref 26.0–34.0)
MCHC: 34.1 g/dL (ref 30.0–36.0)
MCV: 99.8 fL (ref 80.0–100.0)
Monocytes Absolute: 0.8 10*3/uL (ref 0.1–1.0)
Monocytes Relative: 9 %
Neutro Abs: 6.5 10*3/uL (ref 1.7–7.7)
Neutrophils Relative %: 70 %
Platelets: 378 10*3/uL (ref 150–400)
RBC: 4.23 MIL/uL (ref 4.22–5.81)
RDW: 12.3 % (ref 11.5–15.5)
WBC: 9.2 10*3/uL (ref 4.0–10.5)
nRBC: 0 % (ref 0.0–0.2)

## 2018-11-27 LAB — COMPREHENSIVE METABOLIC PANEL
ALT: 14 U/L (ref 0–44)
AST: 16 U/L (ref 15–41)
Albumin: 4.3 g/dL (ref 3.5–5.0)
Alkaline Phosphatase: 60 U/L (ref 38–126)
Anion gap: 5 (ref 5–15)
BUN: 16 mg/dL (ref 8–23)
CO2: 33 mmol/L — ABNORMAL HIGH (ref 22–32)
Calcium: 9.7 mg/dL (ref 8.9–10.3)
Chloride: 90 mmol/L — ABNORMAL LOW (ref 98–111)
Creatinine, Ser: 1.17 mg/dL (ref 0.61–1.24)
GFR calc Af Amer: >60 mL/min (ref >60)
GFR calc non Af Amer: 54 mL/min — ABNORMAL LOW (ref >60)
Glucose, Bld: 110 mg/dL — ABNORMAL HIGH (ref 70–99)
Potassium: 3.9 mmol/L (ref 3.5–5.1)
Sodium: 128 mmol/L — ABNORMAL LOW (ref 135–145)
Total Bilirubin: 1.2 mg/dL (ref 0.3–1.2)
Total Protein: 6.7 g/dL (ref 6.5–8.1)

## 2018-11-27 LAB — LACTATE DEHYDROGENASE: LDH: 155 U/L (ref 98–192)

## 2018-11-27 NOTE — Assessment & Plan Note (Addendum)
#  Pancreatic cyst-tail the pancreas slowly growing- MRI OCT 2019- 3.2 cm.  Patient clinically asymptomatic.  Patient's ca 19-9 slowly rising at 56. Patient previously declined EUS. Awaiting ca 19-9 from today.  If rising will discuss with PCP regarding further evaluation.  # Hyponatremia- 128- ? Etiology. Recommend increase electrolytes/limit free water.   # Essential thrombocytosis [NEG jak/MLP/CALR]- currently on Hydrea 1 every other day.  Stable.  # Elevated Blood pressure-stable [140-150 at home] as per patient.  # DISPOSITION: # follow up in 4 months [pt pref]-MD/labs- cbc/cmp/ca-19-9; Dr.B

## 2018-11-27 NOTE — Progress Notes (Signed)
[place] OFFICE PROGRESS NOTE  Patient Care Team: Jimmy Mc, MD as PCP - General (Internal Medicine) Jimmy Jimenez, Jimmy Gleason, MD as Consulting Physician (General Surgery) Jimmy Mc, MD as Referring Physician (Internal Medicine) Jimmy Alf, MD (Inactive) as Medical Oncologist (Internal Medicine)  SUMMARY OF HEMATOLOGIC HISTORY:  # 2008- ESSENTIAL THROMBOCYTOSIS- Hydrea 547m/d; [Dr.Pandit] NOV 2008- BMBx- slight increase in enlarged megakayocytes; bcr-abl-Neg;Jak-2 NEG; MPL/CALR-NEG; March 2018- OFF Hydrea [? Sec to weight loss/fatigue]; April 2019- platelets- 505- Re-start Hydrea 500 q OD.   # previous TIA   # March 2018- pancreatic cyst [incidental]; slightly growing-October 29 MRI-patient; NOV 2019- declined EUS.  CA-19-9 inconsistent.  INTERVAL HISTORY:  85year-old male patient with above history of essential thrombocytosis currently on surveillance is here for follow-up.  Patient denies abdominal pain denies any nausea vomiting.  Denies any new onset of back pain.  Appetite is good.  Denies any blood clots.  Denies any tingling or numbness.  Review of Systems  Constitutional: Positive for malaise/fatigue and weight loss. Negative for chills, diaphoresis and fever.  HENT: Negative for nosebleeds and sore throat.   Eyes: Negative for double vision.  Respiratory: Negative for cough, hemoptysis, sputum production, shortness of breath and wheezing.   Cardiovascular: Negative for chest pain, palpitations, orthopnea and leg swelling.  Gastrointestinal: Negative for abdominal pain, blood in stool, constipation, diarrhea, heartburn, melena, nausea and vomiting.  Genitourinary: Negative for dysuria, frequency and urgency.  Musculoskeletal: Negative for back pain and joint pain.  Skin: Negative.  Negative for itching and rash.  Neurological: Negative for dizziness, tingling, focal weakness, weakness and headaches.  Endo/Heme/Allergies: Does not bruise/bleed  easily.  Psychiatric/Behavioral: Negative for depression. The patient is not nervous/anxious and does not have insomnia.      PAST MEDICAL HISTORY :  Past Medical History:  Diagnosis Date  . Colon polyps   . [place]   . [place]   . Hyperlipidemia   . Hypertension   . Lung nodule 12/2004   found on CXR  . Osteoporosis     PAST SURGICAL HISTORY :   Past Surgical History:  Procedure Laterality Date  . BONE MARROW BIOPSY  01/18/07  . COLONOSCOPY  2007, 2010   Dr WAllen Norris . NASAL SINUS SURGERY  06/11/05    FAMILY HISTORY :   Family History  Problem Relation Age of Onset  . Colon cancer Mother        colon age 83's . Peripheral vascular disease Father   . Colon cancer Brother        colon age 954's . Diabetes Other   . Prostate cancer Neg Hx   . Bladder Cancer Neg Hx   . Kidney cancer Neg Hx     SOCIAL HISTORY:   Social History   Tobacco Use  . Smoking status: Never Smoker  . Smokeless tobacco: Never Used  Substance Use Topics  . Alcohol use: No  . Drug use: No    ALLERGIES:  is allergic to amlodipine; atenolol; erythromycin; furosemide; hctz [hydrochlorothiazide]; hydrochlorothiazide w-triamterene; levofloxacin; metoprolol; micardis [telmisartan]; penicillins; terazosin; and triamterene-hctz.  MEDICATIONS:  Current Outpatient Medications  Medication Sig Dispense Refill  . amLODipine (NORVASC) 5 MG tablet TAKE 1 TABLET BY MOUTH ONCE DAILY 90 tablet 1  . aspirin EC 81 MG tablet Take 81 mg by mouth daily. Reported on 08/14/2015    . Calcium Citrate-Vitamin D (CITRACAL + D PO) Take 3 tablets by mouth daily. Take 2  by mouth daily, calcium 630 and vitamin D 500 each.    . Cholecalciferol (VITAMIN D3) 1000 UNITS CAPS Take 2,000 Units by mouth daily. Take 2 by mouth daily    . clotrimazole-betamethasone (LOTRISONE) cream Apply topically 2 (two) times daily. 45 g 5  . Cyanocobalamin (B-12 COMPLIANCE INJECTION) 1000 MCG/ML KIT  Inject as directed.    . docusate sodium (COLACE) 100 MG capsule Take 100 mg by mouth 2 (two) times daily as needed for mild constipation.    [name] esomeprazole (NEXIUM) 40 MG capsule TAKE 1 CAPSULE BY MOUTH ONCE DAILY AT NOON 90 capsule 3  . furosemide (LASIX) 20 MG tablet TAKE 1 TABLET BY MOUTH EVERY OTHER DAY IN MORNING 45 tablet 2  . hydrALAZINE (APRESOLINE) 50 MG tablet TAKE 1 TABLET BY MOUTH 3 TIMES DAILY 90 tablet 1  . hydroxyurea (HYDREA) 500 MG capsule TAKE 1 CAPSULE BY MOUTH EVERY OTHER DAY MAY TAKE WITH FOOD TO MINIMIZE GI SIDE EFFECTS. 60 capsule 3  . lisinopril (ZESTRIL) 40 MG tablet TAKE 1 TABLET BY MOUTH ONCE DAILY 90 tablet 1  . testosterone cypionate (DEPOTESTOSTERONE CYPIONATE) 200 MG/ML injection      Current Facility-Administered Medications  Medication Dose Route Frequency Provider Last Rate Last Dose  . testosterone cypionate (DEPOTESTOSTERONE CYPIONATE) injection 50 mg  50 mg Intramuscular Q14 Days Jimmy Mc, MD   50 mg at 10/12/18 1040  . testosterone cypionate (DEPOTESTOSTERONE CYPIONATE) injection 50 mg  50 mg Intramuscular Q14 Days Jimmy Mc, MD   50 mg at 10/26/18 1015  . testosterone cypionate (DEPOTESTOSTERONE CYPIONATE) injection 50 mg  50 mg Intramuscular Q14 Days Jimmy Mc, MD   50 mg at 11/23/18 1015  . testosterone cypionate (DEPOTESTOSTERONE CYPIONATE) injection 50 mg  50 mg Intramuscular Q14 Days Jimmy Mc, MD        PHYSICAL EXAMINATION:   BP (!) 199/73 (BP Location: Left Arm, Patient Position: Sitting, Cuff Size: Normal)   Pulse 86   Temp 97.9 F (36.6 C) (Tympanic)   Wt 145 lb (65.8 kg)   BMI 22.05 kg/m   Filed Weights   11/27/18 1406  Weight: 145 lb (65.8 kg)   Physical Exam  Constitutional: He is oriented to person, place, and time and well-developed, well-nourished, and in no distress.  Patient walking with a cane.  Frail-appearing.  HENT:  Head: Normocephalic and atraumatic.  Mouth/Throat: Oropharynx is clear and  moist. No oropharyngeal exudate.  Eyes: Pupils are equal, round, and reactive to light.  Neck: Normal range of motion. Neck supple.  Cardiovascular: Normal rate and regular rhythm.  Pulmonary/Chest: Effort normal and breath sounds normal. No respiratory distress. He has no wheezes.  Abdominal: Soft. Bowel sounds are normal. He exhibits no distension and no mass. There is no abdominal tenderness. There is no rebound and no guarding.  Musculoskeletal: Normal range of motion.        General: No tenderness or edema.  Neurological: He is alert and oriented to person, place, and time.  Skin: Skin is warm.  Psychiatric: Affect normal.    LABORATORY DATA:  I have reviewed the data as listed    Component Value Date/Time   NA 128 (L) 11/27/2018 1339   NA 136 05/19/2011 1049   K 3.9 11/27/2018 1339   K 4.3 05/19/2011 1049   CL 90 (L) 11/27/2018 1339   CL 99 05/19/2011 1049   CO2 33 (H) 11/27/2018 1339   CO2 32 05/19/2011 1049   GLUCOSE 110 (H)  11/27/2018 1339   GLUCOSE 116 (H) 05/19/2011 1049   BUN 16 11/27/2018 1339   BUN 8 05/19/2011 1049   CREATININE 1.17 11/27/2018 1339   CREATININE 1.15 12/24/2013 0921   CALCIUM 9.7 11/27/2018 1339   CALCIUM 9.0 05/19/2011 1049   PROT 6.7 11/27/2018 1339   PROT 6.8 12/24/2013 0921   ALBUMIN 4.3 11/27/2018 1339   ALBUMIN 4.0 12/24/2013 0921   AST 16 11/27/2018 1339   AST 23 12/24/2013 0921   ALT 14 11/27/2018 1339   ALT 19 12/24/2013 0921   ALKPHOS 60 11/27/2018 1339   ALKPHOS 57 12/24/2013 0921   BILITOT 1.2 11/27/2018 1339   BILITOT 0.9 12/24/2013 0921   GFRNONAA 54 (L) 11/27/2018 1339   GFRNONAA >60 12/24/2013 0921   GFRNONAA 50 (L) 09/03/2013 0930   GFRAA >60 11/27/2018 1339   GFRAA >60 12/24/2013 0921   GFRAA 58 (L) 09/03/2013 0930    No results found for: SPEP, UPEP  Lab Results  Component Value Date   WBC 9.2 11/27/2018   NEUTROABS 6.5 11/27/2018   HGB 14.4 11/27/2018   HCT 42.2 11/27/2018   MCV 99.8 11/27/2018   PLT 378  11/27/2018      Chemistry      Component Value Date/Time   NA 128 (L) 11/27/2018 1339   NA 136 05/19/2011 1049   K 3.9 11/27/2018 1339   K 4.3 05/19/2011 1049   CL 90 (L) 11/27/2018 1339   CL 99 05/19/2011 1049   CO2 33 (H) 11/27/2018 1339   CO2 32 05/19/2011 1049   BUN 16 11/27/2018 1339   BUN 8 05/19/2011 1049   CREATININE 1.17 11/27/2018 1339   CREATININE 1.15 12/24/2013 0921      Component Value Date/Time   CALCIUM 9.7 11/27/2018 1339   CALCIUM 9.0 05/19/2011 1049   ALKPHOS 60 11/27/2018 1339   ALKPHOS 57 12/24/2013 0921   AST 16 11/27/2018 1339   AST 23 12/24/2013 0921   ALT 14 11/27/2018 1339   ALT 19 12/24/2013 0921   BILITOT 1.2 11/27/2018 1339   BILITOT 0.9 12/24/2013 0921       RADIOGRAPHIC STUDIES: I have personally reviewed the radiological images as listed and agreed with the findings in the report. No results found.   ASSESSMENT & PLAN:   Essential thrombocytosis ([place]) #Pancreatic cyst-tail the pancreas slowly growing- MRI OCT 2019- 3.2 cm.  Patient clinically asymptomatic.  Patient's ca 19-9 slowly rising at 56. Patient previously declined EUS. Awaiting ca 19-9 from today.  If rising will discuss with PCP regarding further evaluation.  # Hyponatremia- 128- ? Etiology. Recommend increase electrolytes/limit free water.   # Essential thrombocytosis [NEG jak/MLP/CALR]- currently on Hydrea 1 every other day.  Stable.  # Elevated Blood pressure-stable [140-150 at home] as per patient.  # DISPOSITION: # follow up in 4 months [pt pref]-MD/labs- cbc/cmp/ca-19-9; Dr.B     Cammie Sickle, MD 11/27/2018 9:20 PM

## 2018-11-27 NOTE — Patient Instructions (Signed)
#   Gatorade 3-4 times a week #

## 2018-11-28 ENCOUNTER — Ambulatory Visit (INDEPENDENT_AMBULATORY_CARE_PROVIDER_SITE_OTHER): Payer: PPO | Admitting: Internal Medicine

## 2018-11-28 ENCOUNTER — Encounter: Payer: Self-pay | Admitting: Internal Medicine

## 2018-11-28 DIAGNOSIS — K5909 Other constipation: Secondary | ICD-10-CM

## 2018-11-28 DIAGNOSIS — J8410 Pulmonary fibrosis, unspecified: Secondary | ICD-10-CM

## 2018-11-28 DIAGNOSIS — K8689 Other specified diseases of pancreas: Secondary | ICD-10-CM | POA: Diagnosis not present

## 2018-11-28 LAB — CANCER ANTIGEN 19-9: CA 19-9: 112 U/mL — ABNORMAL HIGH (ref 0–35)

## 2018-11-28 MED ORDER — CLOTRIMAZOLE-BETAMETHASONE 1-0.05 % EX CREA: TOPICAL_CREAM | Freq: Two times a day (BID) | CUTANEOUS | 5 refills | Status: DC

## 2018-11-28 NOTE — Progress Notes (Signed)
Telephone Note This visit type was conducted due to national recommendations for restrictions regarding the COVID-19 pandemic (e.g. social distancing).  This format is felt to be most appropriate for this patient at this time.  All issues noted in this document were discussed and addressed.  No physical exam was performed (except for noted visual exam findings with Video Visits).   I connected with@ on 11/28/18 at  4:30 PM EDT by  telephone and verified that I am speaking with the correct person using two identifiers. Location patient: home Location provider: work or home office Persons participating in the virtual visit: patient, provider  I discussed the limitations, risks, security and privacy concerns of performing an evaluation and management service by telephone and the availability of in person appointments. I also discussed with the patient that there may be a patient responsible charge related to this service. The patient expressed understanding and agreed to proceed.   Reason for visit: constipation   HPI:  85 yr old male with progressively enlarging pancreatic mass , etiology unclear, presents to discuss constipation and need for colonoscopy. Patient has a history of chronic constipation managed with Miralax,  Has had small caliber /girth stools exclusively for at least one month.  He denies rectal pain,  Rectal bleeding,  unintentional weight loss . Last colonoscopy was in 2007 at age 54,  Normal except for diverticulosis 3 yr follow up was advised   2) pancreatic mass: cystic lesion at head of pancreas. Increasing in size by serial MRI.  He has deferred workup with EUS .  CA 19-9 has doubled  In 4 months and repeat imaging notes progressive enlargement   ROS: See pertinent positives and negatives per HPI.  Past Medical History:  Diagnosis Date  . Colon polyps   . Essential thrombocytosis ([place])   . Essential thrombocytosis ([place])   . Hyperlipidemia   . Hypertension   . Lung  nodule 12/2004   found on CXR  . Osteoporosis     Past Surgical History:  Procedure Laterality Date  . BONE MARROW BIOPSY  01/18/07  . COLONOSCOPY  2007, 2010   Dr Allen Norris  . NASAL SINUS SURGERY  06/11/05    Family History  Problem Relation Age of Onset  . Colon cancer Mother        colon age 76's  . Peripheral vascular disease Father   . Colon cancer Brother        colon age 82's  . Diabetes Other   . Prostate cancer Neg Hx   . Bladder Cancer Neg Hx   . Kidney cancer Neg Hx     SOCIAL HX:  reports that he has never smoked. He has never used smokeless tobacco. He reports that he does not drink alcohol or use drugs.   Current Outpatient Medications:  .  amLODipine (NORVASC) 5 MG tablet, TAKE 1 TABLET BY MOUTH ONCE DAILY, Disp: 90 tablet, Rfl: 1 .  aspirin EC 81 MG tablet, Take 81 mg by mouth daily. Reported on 08/14/2015, Disp: , Rfl:  .  Calcium Citrate-Vitamin D (CITRACAL + D PO), Take 3 tablets by mouth daily. Take 2 by mouth daily, calcium 630 and vitamin D 500 each., Disp: , Rfl:  .  Cholecalciferol (VITAMIN D3) 1000 UNITS CAPS, Take 2,000 Units by mouth daily. Take 2 by mouth daily, Disp: , Rfl:  .  clotrimazole-betamethasone (LOTRISONE) cream, Apply topically 2 (two) times daily., Disp: 90 g, Rfl: 5 .  Cyanocobalamin (B-12 COMPLIANCE INJECTION) 1000 MCG/ML  KIT, Inject as directed., Disp: , Rfl:  .  docusate sodium (COLACE) 100 MG capsule, Take 100 mg by mouth 2 (two) times daily as needed for mild constipation., Disp: , Rfl:  .  esomeprazole (NEXIUM) 40 MG capsule, TAKE 1 CAPSULE BY MOUTH ONCE DAILY AT NOON, Disp: 90 capsule, Rfl: 3 .  furosemide (LASIX) 20 MG tablet, TAKE 1 TABLET BY MOUTH EVERY OTHER DAY IN MORNING, Disp: 45 tablet, Rfl: 2 .  hydrALAZINE (APRESOLINE) 50 MG tablet, TAKE 1 TABLET BY MOUTH 3 TIMES DAILY, Disp: 90 tablet, Rfl: 1 .  hydroxyurea (HYDREA) 500 MG capsule, TAKE 1 CAPSULE BY MOUTH EVERY OTHER DAY MAY TAKE WITH FOOD TO MINIMIZE GI SIDE EFFECTS., Disp:  60 capsule, Rfl: 3 .  lisinopril (ZESTRIL) 40 MG tablet, TAKE 1 TABLET BY MOUTH ONCE DAILY, Disp: 90 tablet, Rfl: 1 .  testosterone cypionate (DEPOTESTOSTERONE CYPIONATE) 200 MG/ML injection, , Disp: , Rfl:   Current Facility-Administered Medications:  .  testosterone cypionate (DEPOTESTOSTERONE CYPIONATE) injection 50 mg, 50 mg, Intramuscular, Q14 Days, Crecencio Mc, MD, 50 mg at 10/12/18 1040 .  testosterone cypionate (DEPOTESTOSTERONE CYPIONATE) injection 50 mg, 50 mg, Intramuscular, Q14 Days, Crecencio Mc, MD, 50 mg at 10/26/18 1015 .  testosterone cypionate (DEPOTESTOSTERONE CYPIONATE) injection 50 mg, 50 mg, Intramuscular, Q14 Days, Crecencio Mc, MD, 50 mg at 11/23/18 1015 .  testosterone cypionate (DEPOTESTOSTERONE CYPIONATE) injection 50 mg, 50 mg, Intramuscular, Q14 Days, Crecencio Mc, MD  EXAM:   General impression: alert, cooperative and articulate.  No signs of being in distress  Lungs: speech is fluent sentence length suggests that patient is not short of breath and not punctuated by cough, sneezing or sniffing. [name]   Psych: affect normal.  speech is articulate and non pressured .  Denies suicidal thoughts    ASSESSMENT AND PLAN:  Discussed the following assessment and plan:  Mass of pancreas  Chronic constipation  Mass of pancreas Recomending referral to [facility] for [facility] of progressively enlarging mass   Chronic constipation Offered a cathartic laxative to address changes in stool, followed by GI referral if no improvement  is noted    I discussed the assessment and treatment plan with the patient. The patient was provided an opportunity to ask questions and all were answered. The patient agreed with the plan and demonstrated an understanding of the instructions.   The patient was advised to call back or seek an in-person evaluation if the symptoms worsen or if the condition fails to improve as anticipated.   I provided  21 minutes of non-face-to-face  time during this encounter reviewing patient's current problems and post surgeries.  Providing counseling on the above mentioned problems , and coordination  of care .  Crecencio Mc, MD

## 2018-11-28 NOTE — Assessment & Plan Note (Signed)
Recomending referral to [facility] for EUS of progressively enlarging mass

## 2018-11-28 NOTE — Assessment & Plan Note (Signed)
Offered a cathartic laxative to address changes in stool, followed by GI referral if no improvement  is noted

## 2018-11-30 ENCOUNTER — Ambulatory Visit: Payer: PPO

## 2018-12-07 ENCOUNTER — Telehealth: Payer: Self-pay | Admitting: Internal Medicine

## 2018-12-07 ENCOUNTER — Other Ambulatory Visit: Payer: Self-pay | Admitting: Internal Medicine

## 2018-12-07 ENCOUNTER — Ambulatory Visit (INDEPENDENT_AMBULATORY_CARE_PROVIDER_SITE_OTHER): Payer: PPO

## 2018-12-07 ENCOUNTER — Other Ambulatory Visit: Payer: Self-pay

## 2018-12-07 DIAGNOSIS — E538 Deficiency of other specified B group vitamins: Secondary | ICD-10-CM | POA: Diagnosis not present

## 2018-12-07 DIAGNOSIS — E349 Endocrine disorder, unspecified: Secondary | ICD-10-CM | POA: Diagnosis not present

## 2018-12-07 DIAGNOSIS — R7989 Other specified abnormal findings of blood chemistry: Secondary | ICD-10-CM

## 2018-12-07 MED ORDER — CYANOCOBALAMIN 1000 MCG/ML IJ SOLN
1000.0000 ug | Freq: Once | INTRAMUSCULAR | Status: AC
  Administered 2018-12-07: 1000 ug via INTRAMUSCULAR

## 2018-12-07 NOTE — Telephone Encounter (Signed)
Wednesday is fine

## 2018-12-07 NOTE — Progress Notes (Signed)
Patient presented today for B12 injection.  Administered IM in left deltoid.  Patient tolerated well with no signs of distress.    Patient also presented today for testosterone injection.  Administered IM in the left upper outer quad of gluteal.  Patient tolerated well with no signs of distress.

## 2018-12-07 NOTE — Telephone Encounter (Signed)
See unrouted message about need for office visit. WITH WIFE

## 2018-12-07 NOTE — Telephone Encounter (Signed)
Please schedule a face to face asap  I have discussed his pancreatic mass with his oncologist and we need to make a decision

## 2018-12-07 NOTE — Telephone Encounter (Signed)
The first available appt in the evening is going to be next Wednesday at 4:30. Is that soon enough or do we need to try to work him in somewhere or try moving someone else around?

## 2018-12-07 NOTE — Telephone Encounter (Signed)
Discussed with Dr. Derrel Nip patient's PCP regarding rising CA 19-9-question pancreatic malignancy-regarding possible management/treatment options.   Patient previously reviewed the tumor conference/discussed with GI for endoscopic ultrasound.  He declined.  Kristi-please reach out to the patient/wife to see if patient interested pursuing EUS.  Also I will be happy to talk to available GI/EUS Dr. regarding the patient's situation.  Please let me know who should I talk to.  GB  FYI-Dr.Tullo-Thanks!

## 2018-12-08 NOTE — Telephone Encounter (Signed)
LMTCB

## 2018-12-08 NOTE — Telephone Encounter (Signed)
I do not see any imaging since 11/2017. Does he need repeat MRI to review prior to EUS, if they are interested?

## 2018-12-12 ENCOUNTER — Telehealth: Payer: Self-pay

## 2018-12-12 NOTE — Telephone Encounter (Signed)
Pt is aware of appt

## 2018-12-12 NOTE — Telephone Encounter (Signed)
Called and spoke with Ms. Ferrand regarding Mr. Fenger CA19-9 rise. Per Dr. Rogue Bussing, Dr.Tullo is also going to be discussing this with them. He has this appointment tomorrow. We last spoke 12/2017 and offered EUS which he declined. She will let us know the plan after appointment with Dr. Derrel Nip.

## 2018-12-13 ENCOUNTER — Other Ambulatory Visit: Payer: Self-pay

## 2018-12-13 ENCOUNTER — Ambulatory Visit (INDEPENDENT_AMBULATORY_CARE_PROVIDER_SITE_OTHER): Payer: PPO | Admitting: Internal Medicine

## 2018-12-13 ENCOUNTER — Encounter: Payer: Self-pay | Admitting: Internal Medicine

## 2018-12-13 DIAGNOSIS — K8689 Other specified diseases of pancreas: Secondary | ICD-10-CM

## 2018-12-13 NOTE — Assessment & Plan Note (Signed)
Cystic  Multilobed, increased in size since original imaging in 2018.  Concern for malignancy discussed.  He is now interested in pursuing a tissue diagnosis

## 2018-12-13 NOTE — Progress Notes (Signed)
Subjective:  Patient ID: Jimmy Jimenez, male    DOB: 03/13/1925  Age: 85 y.o. MRN: [id number]  CC: The encounter diagnosis was Mass of pancreas.  HPI Jimmy Jimenez presents for discussion of  The enlarging pancreatic mass found on patient's pancreatic head in Feb 2018 . Reviewed images and history of mass with patient and wife, including the fat that it has grown from 1.6 cm in Feb 2018  To 3.4 cm  By the most recent MRI.  Reviewed the serologic markers concerning for malignancy that have been rising.    He denies any symptoms of abdominal pain, nausea, anorexia and weight loss. However he does want to pursue treatment if the mass is malignant. Explained that a tissue biopsy is needed ASAP and he is ready to proceed with EUS.  2) Hypertension: patient 's BP at home today was 130/80 .  Outpatient Medications Prior to Visit  Medication Sig Dispense Refill  . amLODipine (NORVASC) 5 MG tablet TAKE 1 TABLET BY MOUTH ONCE DAILY 90 tablet 1  . aspirin EC 81 MG tablet Take 81 mg by mouth daily. Reported on 08/14/2015    . Calcium Citrate-Vitamin D (CITRACAL + D PO) Take 3 tablets by mouth daily. Take 2 by mouth daily, calcium 630 and vitamin D 500 each.    . Cholecalciferol (VITAMIN D3) 1000 UNITS CAPS Take 2,000 Units by mouth daily. Take 2 by mouth daily    . clotrimazole-betamethasone (LOTRISONE) cream Apply topically 2 (two) times daily. 90 g 5  . Cyanocobalamin (B-12 COMPLIANCE INJECTION) 1000 MCG/ML KIT Inject as directed.    . docusate sodium (COLACE) 100 MG capsule Take 100 mg by mouth 2 (two) times daily as needed for mild constipation.    [name] esomeprazole (NEXIUM) 40 MG capsule TAKE 1 CAPSULE BY MOUTH ONCE DAILY AT NOON 90 capsule 3  . furosemide (LASIX) 20 MG tablet TAKE 1 TABLET BY MOUTH EVERY OTHER DAY IN MORNING 45 tablet 2  . hydrALAZINE (APRESOLINE) 50 MG tablet TAKE 1 TABLET BY MOUTH 3 TIMES DAILY 90 tablet 1  . hydroxyurea (HYDREA) 500 MG capsule TAKE 1 CAPSULE BY MOUTH EVERY OTHER  DAY MAY TAKE WITH FOOD TO MINIMIZE GI SIDE EFFECTS. 60 capsule 3  . lisinopril (ZESTRIL) 40 MG tablet TAKE 1 TABLET BY MOUTH ONCE DAILY 90 tablet 1  . testosterone cypionate (DEPOTESTOSTERONE CYPIONATE) 200 MG/ML injection      Facility-Administered Medications Prior to Visit  Medication Dose Route Frequency Provider Last Rate Last Dose  . testosterone cypionate (DEPOTESTOSTERONE CYPIONATE) injection 50 mg  50 mg Intramuscular Q14 Days Crecencio Mc, MD   50 mg at 10/12/18 1040  . testosterone cypionate (DEPOTESTOSTERONE CYPIONATE) injection 50 mg  50 mg Intramuscular Q14 Days Crecencio Mc, MD   50 mg at 10/26/18 1015  . testosterone cypionate (DEPOTESTOSTERONE CYPIONATE) injection 50 mg  50 mg Intramuscular Q14 Days Crecencio Mc, MD   50 mg at 12/07/18 1025  . testosterone cypionate (DEPOTESTOSTERONE CYPIONATE) injection 50 mg  50 mg Intramuscular Q14 Days Crecencio Mc, MD   50 mg at 12/07/18 1025    Review of Systems;  Patient denies headache, fevers, malaise, unintentional weight loss, skin rash, eye pain, sinus congestion and sinus pain, sore throat, dysphagia,  hemoptysis , cough, dyspnea, wheezing, chest pain, palpitations, orthopnea, edema, abdominal pain, nausea, melena, diarrhea, constipation, flank pain, dysuria, hematuria, urinary  Frequency, nocturia, numbness, tingling, seizures,  Focal weakness, Loss of consciousness,  Tremor, insomnia, depression, anxiety, and  suicidal ideation.      Objective:  BP (!) 180/58 (BP Location: Left Arm, Patient Position: Sitting, Cuff Size: Normal)   Pulse 89   Temp 97.9 F (36.6 C) (Temporal)   Resp 16   Ht 5' 8" (1.727 m)   Wt 147 lb 6.4 oz (66.9 kg)   SpO2 92%   BMI 22.41 kg/m   BP Readings from Last 3 Encounters:  12/13/18 (!) 180/58  11/27/18 (!) 199/73  09/28/18 (!) 162/64    Wt Readings from Last 3 Encounters:  12/13/18 147 lb 6.4 oz (66.9 kg)  11/28/18 145 lb (65.8 kg)  11/27/18 145 lb (65.8 kg)    General  appearance: alert, cooperative and appears stated age Ears: normal TM's and external ear canals both ears Throat: lips, mucosa, and tongue normal; teeth and gums normal Neck: no adenopathy, no carotid bruit, supple, symmetrical, trachea midline and thyroid not enlarged, symmetric, no tenderness/mass/nodules Back: symmetric, no curvature. ROM normal. No CVA tenderness. Lungs: clear to auscultation bilaterally Heart: regular rate and rhythm, S1, S2 normal, no murmur, click, rub or gallop Abdomen: soft, non-tender; bowel sounds normal; no masses,  no organomegaly Pulses: 2+ and symmetric Skin: Skin color, texture, turgor normal. No rashes or lesions Lymph nodes: Cervical, supraclavicular, and axillary nodes normal.  No results found for: HGBA1C  Lab Results  Component Value Date   CREATININE 1.17 11/27/2018   CREATININE 0.93 09/28/2018   CREATININE 1.01 07/27/2018    Lab Results  Component Value Date   WBC 9.2 11/27/2018   HGB 14.4 11/27/2018   HCT 42.2 11/27/2018   PLT 378 11/27/2018   GLUCOSE 110 (H) 11/27/2018   LDLDIRECT 103.7 09/26/2013   ALT 14 11/27/2018   AST 16 11/27/2018   NA 128 (L) 11/27/2018   K 3.9 11/27/2018   CL 90 (L) 11/27/2018   CREATININE 1.17 11/27/2018   BUN 16 11/27/2018   CO2 33 (H) 11/27/2018   TSH 1.80 09/28/2018   PSA 0.88 08/19/2014   INR 1.02 08/31/2016   MICROALBUR 5.2 (H) 09/28/2018    Mr Abdomen W Wo Contrast  Result Date: 12/20/2017 CLINICAL DATA:  Cystic lesion head of the pancreas. Six-month follow-up. EXAM: MRI ABDOMEN WITHOUT AND WITH CONTRAST TECHNIQUE: Multiplanar multisequence MR imaging of the abdomen was performed both before and after the administration of intravenous contrast. CONTRAST:  7 mL Gadavist COMPARISON:  None. FINDINGS: Lower chest:  Lung bases are clear. Hepatobiliary: No focal hepatic lesion. Gallbladder normal. Common bile duct appears normal. Pancreas: Cystic lesion in the head of pancreas has increase in size  measuring 3.4 x 2.1 cm in axial dimension (image 18/5) compared to 2.2 x 1.2 cm on MRI 6 months prior. Cystic lesion is multilobular. No clear nodularity the multi cystic complex. Lesion is in the uncinate region of the pancreas. There is no clear communication with the pancreatic duct. Postcontrast imaging demonstrates no enhancement or nodularity. No pancreatic duct dilatation. No pancreatic inflammation Spleen: Normal spleen. Adrenals/urinary tract: Adrenal glands and kidneys are normal. Stomach/Bowel: Stomach and limited of the small bowel is unremarkable Vascular/Lymphatic: Abdominal is normal. No abdominal lymphadenopathy Musculoskeletal: Enhancing lesion in the T10 vertebral body and pedicle of L2 consistent benign hemangiomas when compared to comparison CT. IMPRESSION: 1. Enlarging multi-cystic lesion within the uncinate region of the pancreas. Recommend endoscopic ultrasound with tissue sampling/aspiration to evaluate for cystic neoplasm. 2. No hepatobiliary abnormality. 3. Benign hemangiomas in the spine. Electronically Signed   By: Suzy Bouchard M.D.   On:  12/20/2017 14:02    Assessment & Plan:   Problem List Items Addressed This Visit      Unprioritized   Mass of pancreas    Cystic  Multilobed, increased in size since original imaging in 2018.  Concern for malignancy discussed.  He is now interested in pursuing a tissue diagnosis         I am having [name] maintain his Calcium Citrate-Vitamin D (CITRACAL + D PO), Vitamin D3, aspirin EC, docusate sodium, Cyanocobalamin, amLODipine, hydroxyurea, lisinopril, testosterone cypionate, furosemide, esomeprazole, clotrimazole-betamethasone, and hydrALAZINE. We will continue to administer testosterone cypionate, testosterone cypionate, testosterone cypionate, and testosterone cypionate.  No orders of the defined types were placed in this encounter.  A total of 25 minutes of face to face time was spent with patient more than half of  which was spent in counselling about the above mentioned conditions  and coordination of care   There are no discontinued medications.  Follow-up: No follow-ups on file.   Crecencio Mc, MD

## 2018-12-15 ENCOUNTER — Telehealth: Payer: Self-pay | Admitting: Internal Medicine

## 2018-12-15 ENCOUNTER — Telehealth: Payer: Self-pay

## 2018-12-15 ENCOUNTER — Other Ambulatory Visit: Payer: Self-pay

## 2018-12-15 DIAGNOSIS — K862 Cyst of pancreas: Secondary | ICD-10-CM

## 2018-12-15 NOTE — Telephone Encounter (Signed)
Discussed with Dr. Mont Dutton at [facility] repeating MRI of the abdomen-to better assess the pancreatic cystic lesions-looking for any solid component.  If continues to be cystic-would recommend surveillance; however if solid component noted-EUS/referral to surgery for laparoscopic resection.   For now await MRI. Discussed with Drue Dun.   Drue Dun- in my absence please feel free to review the MRI at the tumor conference/and get a opinion from the GI EUS doctors.   FYI-Dr.Tullo.

## 2018-12-15 NOTE — Telephone Encounter (Signed)
Dr. Rogue Bussing has discussed with Dr. Mont Dutton and we will arrange MRI abdomen prior to scheduling biopsy.

## 2018-12-15 NOTE — Telephone Encounter (Signed)
EUS case sent to Dr. Mont Dutton for review prior to scheduling.

## 2018-12-18 ENCOUNTER — Telehealth: Payer: Self-pay

## 2018-12-18 NOTE — Telephone Encounter (Signed)
MRI has been scheduled for 10/23 at 1300 with 1230 [facility]. Instructed not to eat or drink 4 hours prior. Voicemail left with Mrs. Feser with the instructions and asked her to call back and let me know that she received these instructions.

## 2018-12-18 NOTE — Telephone Encounter (Signed)
Jimmy Jimenez returned call and we went over MRI date/time/prep. Read back performed.

## 2018-12-18 NOTE — Telephone Encounter (Signed)
Jimmy Jimenez returned call. I explained need for updated imaging. Prior imaging a year ago. We will proceed with MRI and those results will determine biopsy. Message sent to scheduling to arrange.

## 2018-12-20 DIAGNOSIS — H25813 Combined forms of age-related cataract, bilateral: Secondary | ICD-10-CM | POA: Diagnosis not present

## 2018-12-21 ENCOUNTER — Ambulatory Visit (INDEPENDENT_AMBULATORY_CARE_PROVIDER_SITE_OTHER): Payer: PPO

## 2018-12-21 ENCOUNTER — Other Ambulatory Visit: Payer: Self-pay

## 2018-12-21 DIAGNOSIS — R7989 Other specified abnormal findings of blood chemistry: Secondary | ICD-10-CM

## 2018-12-21 DIAGNOSIS — E291 Testicular hypofunction: Secondary | ICD-10-CM

## 2018-12-21 MED ORDER — TESTOSTERONE CYPIONATE 200 MG/ML IM SOLN
50.0000 mg | INTRAMUSCULAR | Status: DC
  Administered 2018-12-21 – 2019-01-18 (×2): 50 mg via INTRAMUSCULAR

## 2018-12-21 NOTE — Progress Notes (Signed)
Patient presented today for testosterone injection.  Administered IM in the right upper outer quadrant of gluteal.  Patient tolerated well with no signs of distress.

## 2018-12-22 ENCOUNTER — Ambulatory Visit
Admission: RE | Admit: 2018-12-22 | Discharge: 2018-12-22 | Disposition: A | Discharge status: Home / Self Care | Payer: PPO | Source: Ambulatory Visit | Attending: Internal Medicine | Admitting: Internal Medicine

## 2018-12-22 ENCOUNTER — Other Ambulatory Visit: Payer: Self-pay

## 2018-12-22 DIAGNOSIS — K862 Cyst of pancreas: Secondary | ICD-10-CM | POA: Diagnosis not present

## 2018-12-22 MED ORDER — GADOBUTROL 1 MMOL/ML IV SOLN
6.0000 mL | Freq: Once | INTRAVENOUS | Status: AC | PRN
  Administered 2018-12-22: 6 mL via INTRAVENOUS

## 2018-12-25 ENCOUNTER — Other Ambulatory Visit: Payer: Self-pay | Admitting: Internal Medicine

## 2018-12-28 ENCOUNTER — Telehealth: Payer: Self-pay

## 2018-12-28 NOTE — Telephone Encounter (Signed)
With Dr. B being out of the office, I can call them. I did not want to call unless this plan was approved.

## 2018-12-28 NOTE — Telephone Encounter (Signed)
OK, Thank you very much

## 2018-12-28 NOTE — Telephone Encounter (Signed)
MRI imaging reviewed with Dr. Mont Dutton. With no ductal dilation, no mural nodularity, and stability over the last year, this is likely IPMN. In considering biopsy, question is how aggressive would Jimmy Jimenez want to be. Treatment would require surgery, likely whipple. Recommended if he wants to be aggressive, he needs to see a pancreatic surgeon and talk with them. If he is interested in surgery after meeting with the surgeon, and surgeon needs a biopsy for confirmation, they would be happy to perform FNA. Dr. Rogue Bussing is currently out of town. I have not discussed results of MRI with Jimmy Jimenez or recommendation regarding biopsy and consult with surgeon.

## 2018-12-28 NOTE — Telephone Encounter (Signed)
Please clarify: are you expecting me to convey this information to the patient or have you set him up with you or Dr B to convey ?

## 2018-12-29 ENCOUNTER — Telehealth: Payer: Self-pay

## 2018-12-29 NOTE — Telephone Encounter (Signed)
Voicemail left with Mr./Mrs. Carra to call and discuss MRI and recommendations.

## 2018-12-29 NOTE — Telephone Encounter (Signed)
Call placed to Mr/Mrs [facility]. We reviewed MRI scan. MRI imaging reviewed with Dr. Mont Dutton. With no ductal dilation, no mural nodularity, and stability over the last year, this is likely IPMN. In considering biopsy, question is how aggressive would Jimmy Jimenez want to be. Treatment would require surgery, likely whipple. Recommended if he wants to be aggressive, he needs to see a pancreatic surgeon and talk with them. If he is interested in surgery after meeting with the surgeon, and surgeon needs a biopsy for confirmation, they would be happy to perform FNA. He is interested in meeting with surgeon. We discussed that there are no pancreatic surgeons in [place]. We would refer to [facility], [place], or [place].  They would like to discuss it this weekend and call me Monday with final decision.

## 2019-01-02 ENCOUNTER — Telehealth: Payer: Self-pay | Admitting: Internal Medicine

## 2019-01-02 NOTE — Telephone Encounter (Signed)
Discussed with Mathis Fare regarding patient's plan of care.  Reviewed the MRI-no significant concerns for pancreatic adenocarcinoma/as per reviewed with GI.   However given patient's willingness to discuss definitive surgical options; I think is reasonable to await their decision.  After evaluation with surgery if patient is deemed not a surgical candidate-we will discontinue surveillance imaging.

## 2019-01-03 ENCOUNTER — Telehealth: Payer: Self-pay

## 2019-01-03 NOTE — Telephone Encounter (Signed)
Call placed to Mr. Creasman regarding decision to see a Psychologist, sport and exercise. He voiced at this time, he has not made up his mind and would like to let us know when he does. I offered to call him back in a few days and he prefers to call us when he decides. He put his spouse, Delosis, on the phone and she was provided contact number for the cancer center, as well as my contact information again. We will await his decision. He has follow up with Dr. Rogue Bussing in January 2021.

## 2019-01-04 ENCOUNTER — Other Ambulatory Visit: Payer: Self-pay

## 2019-01-04 ENCOUNTER — Telehealth: Payer: Self-pay

## 2019-01-04 ENCOUNTER — Ambulatory Visit (INDEPENDENT_AMBULATORY_CARE_PROVIDER_SITE_OTHER): Payer: PPO

## 2019-01-04 DIAGNOSIS — E349 Endocrine disorder, unspecified: Secondary | ICD-10-CM | POA: Diagnosis not present

## 2019-01-04 DIAGNOSIS — E538 Deficiency of other specified B group vitamins: Secondary | ICD-10-CM | POA: Diagnosis not present

## 2019-01-04 DIAGNOSIS — R7989 Other specified abnormal findings of blood chemistry: Secondary | ICD-10-CM

## 2019-01-04 MED ORDER — BENZONATATE 200 MG PO CAPS: 200.0000 mg | ORAL_CAPSULE | Freq: Two times a day (BID) | ORAL | 0 refills | Status: DC | PRN

## 2019-01-04 MED ORDER — CYANOCOBALAMIN 1000 MCG/ML IJ SOLN
1000.0000 ug | Freq: Once | INTRAMUSCULAR | Status: AC
  Administered 2019-01-04: 1000 ug via INTRAMUSCULAR

## 2019-01-04 NOTE — Telephone Encounter (Signed)
Called patient.  No answer.  Left detailed message (ok per DPR) that cough medicine has been sent into [facility].

## 2019-01-04 NOTE — Progress Notes (Signed)
Patient presented today for B12 and testosterone injections.  B12 injection administered IM in the right deltoid.  Testosterone injection administered IM in the left upper outer quadrant of gluteal.  Patient tolerated well with no signs of distress.

## 2019-01-04 NOTE — Telephone Encounter (Signed)
Patient presented today for B12 and testosterone nurse visit.  While at visit patient c/o having a cough and sometimes producing a small amount of yellow mucus.  Patient denies having a fever.  No other symptoms.  No exposure to anyone being sick.  Patient said that this is a chronic issue that is ongoing.  Patient said that Dr. Derrel Nip has called him something into the pharmacy before and request if something could be sent into pharmacy today.  Best call back number:  [phone].  Patient is ok with message being left on machine.  Please advise if pt needs to schedule a phone visit or if something could be called into pharmacy.

## 2019-01-04 NOTE — Telephone Encounter (Signed)
Cough medicine setn to pharmacy

## 2019-01-05 ENCOUNTER — Telehealth: Payer: Self-pay

## 2019-01-05 NOTE — Telephone Encounter (Signed)
Received call from Jimmy Jimenez. Jimmy Jimenez has decided he would like referral to consult with surgeon at [facility]. I will send referral today.

## 2019-01-05 NOTE — Telephone Encounter (Signed)
Jimmy Jimenez- I think surgical opinion is reasonable if pt/family want to be "aggressive". WIll await surgical opinion if pt is a candidate for any surgical options given his age/fraility. If surgeon recommends conservative measures,  we then can discuss with pt to stop surveillance imaging moving forward.   Jimmy Jimenez- Thank you for coordinating the care.  GB  FI-Dr.Tullo

## 2019-01-09 ENCOUNTER — Telehealth: Payer: Self-pay

## 2019-01-09 NOTE — Telephone Encounter (Signed)
Called and notified Mrs. Hassell Done with appointment details for Dr. Mariah Milling. Encouraged them to call with any questions or needs.  November 17th at 1230, arrive at 1215. [facility] [place] [id number]

## 2019-01-18 ENCOUNTER — Other Ambulatory Visit: Payer: Self-pay

## 2019-01-18 ENCOUNTER — Ambulatory Visit (INDEPENDENT_AMBULATORY_CARE_PROVIDER_SITE_OTHER): Payer: PPO

## 2019-01-18 DIAGNOSIS — E291 Testicular hypofunction: Secondary | ICD-10-CM | POA: Diagnosis not present

## 2019-01-18 DIAGNOSIS — R7989 Other specified abnormal findings of blood chemistry: Secondary | ICD-10-CM

## 2019-01-18 NOTE — Progress Notes (Signed)
Jimmy Jimenez presents today for injection per MD orders. Testosterone injection  administered IM in left Gluteal. Administration without incident. Patient tolerated well.  Kaytlin Burklow,cma   

## 2019-02-01 ENCOUNTER — Ambulatory Visit (INDEPENDENT_AMBULATORY_CARE_PROVIDER_SITE_OTHER): Payer: PPO | Admitting: *Deleted

## 2019-02-01 ENCOUNTER — Other Ambulatory Visit: Payer: Self-pay

## 2019-02-01 DIAGNOSIS — R7989 Other specified abnormal findings of blood chemistry: Secondary | ICD-10-CM | POA: Diagnosis not present

## 2019-02-01 DIAGNOSIS — E538 Deficiency of other specified B group vitamins: Secondary | ICD-10-CM

## 2019-02-01 MED ORDER — TESTOSTERONE CYPIONATE 200 MG/ML IM SOLN
50.0000 mg | Freq: Once | INTRAMUSCULAR | Status: AC
  Administered 2019-02-01: 50 mg via INTRAMUSCULAR

## 2019-02-01 MED ORDER — CYANOCOBALAMIN 1000 MCG/ML IJ SOLN
1000.0000 ug | Freq: Once | INTRAMUSCULAR | Status: AC
  Administered 2019-02-01: 1000 ug via INTRAMUSCULAR

## 2019-02-01 NOTE — Progress Notes (Signed)
Patient presented for B 12 injection to right deltoid, patient voiced no concerns nor showed any signs of distress during injection. Patient also given Testosterone injection RUQ patient voiced no complaints or concerns.

## 2019-02-13 ENCOUNTER — Other Ambulatory Visit: Payer: Self-pay

## 2019-02-13 ENCOUNTER — Telehealth: Payer: Self-pay | Admitting: Internal Medicine

## 2019-02-13 NOTE — Telephone Encounter (Signed)
Pt wife called stating pt wants to talk with Dr Derrel Nip about a procedure he has to have. Please advise and thank you!  Call pt @ [phone]

## 2019-02-14 NOTE — Telephone Encounter (Signed)
Spoke with pt's wife and scheduled the pt a telephone visit to discuss the procedure that the pt has scheduled for January 13th. Wife would not tell me what kind of procedure she was having, she stated that she would discuss it with Dr. Derrel Nip.

## 2019-02-15 ENCOUNTER — Ambulatory Visit (INDEPENDENT_AMBULATORY_CARE_PROVIDER_SITE_OTHER): Payer: PPO | Admitting: *Deleted

## 2019-02-15 ENCOUNTER — Other Ambulatory Visit: Payer: Self-pay

## 2019-02-15 ENCOUNTER — Other Ambulatory Visit: Payer: Self-pay | Admitting: Internal Medicine

## 2019-02-15 DIAGNOSIS — R7989 Other specified abnormal findings of blood chemistry: Secondary | ICD-10-CM

## 2019-02-15 MED ORDER — TESTOSTERONE CYPIONATE 200 MG/ML IM SOLN
50.0000 mg | Freq: Once | INTRAMUSCULAR | Status: AC
  Administered 2019-02-15: 50 mg via INTRAMUSCULAR

## 2019-02-15 MED ORDER — DERMACLOUD EX CREA: 1.0000 "application " | TOPICAL_CREAM | CUTANEOUS | 2 refills | Status: DC | PRN

## 2019-02-15 NOTE — Telephone Encounter (Signed)
Refilled: 08/31/2018 Last OV: 12/13/2018 Next OV: 02/19/2019

## 2019-02-15 NOTE — Progress Notes (Addendum)
Patient came in for testosterone injection to LUQ tolerated well voiced no concerns during injection or to injection . After injection patient ask if I could look at his left buttock approximately one inch from the anal opening was 0.5 CM  In circumference pressure ulcer between stage 2-3 with small amount of fatty tissue visible. Advised patient protocol for relieving pressure to rea and advised of keeping clean and dry consulted with PCP for further instruction and was given verbal for Dermacloud order to apply between bowel movements also recommended patient to get a donut pillow to help relieve pressure . Patient will keep appointment with PCP for evaluation  02/19/19.   I have reviewed the above information and agree with above.   Deborra Medina, MD

## 2019-02-19 ENCOUNTER — Ambulatory Visit (INDEPENDENT_AMBULATORY_CARE_PROVIDER_SITE_OTHER): Payer: PPO | Admitting: Internal Medicine

## 2019-02-19 ENCOUNTER — Encounter: Payer: Self-pay | Admitting: Internal Medicine

## 2019-02-19 DIAGNOSIS — L89152 Pressure ulcer of sacral region, stage 2: Secondary | ICD-10-CM | POA: Diagnosis not present

## 2019-02-19 DIAGNOSIS — R634 Abnormal weight loss: Secondary | ICD-10-CM | POA: Diagnosis not present

## 2019-02-19 DIAGNOSIS — I10 Essential (primary) hypertension: Secondary | ICD-10-CM | POA: Diagnosis not present

## 2019-02-19 DIAGNOSIS — K8689 Other specified diseases of pancreas: Secondary | ICD-10-CM | POA: Diagnosis not present

## 2019-02-19 NOTE — Assessment & Plan Note (Signed)
Cystic,  Enlarging by serial imaging studies.  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  EUS -FNA planned for Jan 13 at [facility] in [place]

## 2019-02-19 NOTE — Assessment & Plan Note (Signed)
Risk factors include unintentional weight loss,  Possible CA of pancreas.  Dermacloud apply freely,  Offloading sacral area with inflatable ring .  Follow up one week

## 2019-02-19 NOTE — Assessment & Plan Note (Signed)
Unclear if due to aging or duet to pancreatic mass. Continue supplemental nutrition drinks

## 2019-02-19 NOTE — Assessment & Plan Note (Signed)
Patient checks BP daily at one and systolics have averaged [id number] to 150.  Given his age and prior medication intolerance,   No tighter control is advised

## 2019-02-19 NOTE — Progress Notes (Signed)
Telephone   Note  This visit type was conducted due to national recommendations for restrictions regarding the COVID-19 pandemic (e.g. social distancing).  This format is felt to be most appropriate for this patient at this time.  All issues noted in this document were discussed and addressed.  No physical exam was performed (except for noted visual exam findings with Video Visits).   I connected with@ on 02/19/19 at 10:00 AM EST by telephone and verified that I am speaking with the correct person using two identifiers. Location patient: home Location provider: work or home office Persons participating in the virtual visit: patient, provider and wife Deloise  I discussed the limitations, risks, security and privacy concerns of performing an evaluation and management service by telephone and the availability of in person appointments. I also discussed with the patient that there may be a patient responsible charge related to this service. The patient expressed understanding and agreed to proceed.  Reason for visit: follow up   HPI:  1) pancreatic cystic mass:  For EUS Jan 13 at [place] .  He has had some weight loss but denies pai   2) HTN:    Home readings 671 or 245 systolic.   3) sacral decub ulcer on left buttock . Noticed about 8 days ago.  She has purchased an inflatable cushion ring and the dermacloud barrier ointment this weekend.    ROS: See pertinent positives and negatives per HPI.  Past Medical History:  Diagnosis Date  . Colon polyps   . Essential thrombocytosis ([place])   . Essential thrombocytosis ([place])   . Hyperlipidemia   . Hypertension   . Lung nodule 12/2004   found on CXR  . Osteoporosis     Past Surgical History:  Procedure Laterality Date  . BONE MARROW BIOPSY  01/18/07  . COLONOSCOPY  2007, 2010   Dr Allen Norris  . NASAL SINUS SURGERY  06/11/05    Family History  Problem Relation Age of Onset  . Colon cancer Mother        colon age 39's  .  Peripheral vascular disease Father   . Colon cancer Brother        colon age 1's  . Diabetes Other   . Prostate cancer Neg Hx   . Bladder Cancer Neg Hx   . Kidney cancer Neg Hx     SOCIAL HX:  reports that he has never smoked. He has never used smokeless tobacco. He reports that he does not drink alcohol or use drugs.   Current Outpatient Medications:  .  amLODipine (NORVASC) 5 MG tablet, TAKE 1 TABLET BY MOUTH ONCE DAILY, Disp: 90 tablet, Rfl: 1 .  aspirin EC 81 MG tablet, Take 81 mg by mouth daily. Reported on 08/14/2015, Disp: , Rfl:  .  azelastine (ASTELIN) 0.1 % nasal spray, , Disp: , Rfl:  .  benzonatate (TESSALON) 200 MG capsule, Take 1 capsule (200 mg total) by mouth 2 (two) times daily as needed for cough., Disp: 20 capsule, Rfl: 0 .  Calcium Citrate-Vitamin D (CITRACAL + D PO), Take 3 tablets by mouth daily. Take 2 by mouth daily, calcium 630 and vitamin D 500 each., Disp: , Rfl:  .  Cholecalciferol (VITAMIN D3) 1000 UNITS CAPS, Take 2,000 Units by mouth daily. Take 2 by mouth daily, Disp: , Rfl:  .  clotrimazole-betamethasone (LOTRISONE) cream, Apply topically 2 (two) times daily., Disp: 90 g, Rfl: 5 .  Cyanocobalamin (B-12 COMPLIANCE INJECTION) 1000 MCG/ML KIT,  Inject as directed., Disp: , Rfl:  .  docusate sodium (COLACE) 100 MG capsule, Take 100 mg by mouth 2 (two) times daily as needed for mild constipation., Disp: , Rfl:  .  esomeprazole (NEXIUM) 40 MG capsule, TAKE 1 CAPSULE BY MOUTH ONCE DAILY AT NOON, Disp: 90 capsule, Rfl: 3 .  furosemide (LASIX) 20 MG tablet, TAKE 1 TABLET BY MOUTH EVERY OTHER DAY IN MORNING, Disp: 45 tablet, Rfl: 2 .  hydrALAZINE (APRESOLINE) 50 MG tablet, TAKE 1 TABLET BY MOUTH 3 TIMES DAILY, Disp: 90 tablet, Rfl: 1 .  hydroxyurea (HYDREA) 500 MG capsule, TAKE 1 CAPSULE BY MOUTH EVERY OTHER DAY MAY TAKE WITH FOOD TO MINIMIZE GI SIDE EFFECTS., Disp: 60 capsule, Rfl: 3 .  Infant Care Products (DERMACLOUD) CREA, Apply 1 application topically as needed  (after each bowel movement for barrier for  pressure ulcer.)., Disp: 430 g, Rfl: 2 .  lisinopril (ZESTRIL) 40 MG tablet, TAKE 1 TABLET BY MOUTH ONCE DAILY, Disp: 90 tablet, Rfl: 1 .  testosterone cypionate (DEPOTESTOSTERONE CYPIONATE) 200 MG/ML injection, INJECT 0.25 ML INTRAMUSCULARLY EVERY 14 DAYS AS DIRECTED, Disp: 10 mL, Rfl: 1  EXAM:   General impression: alert, cooperative and articulate.  No signs of being in distress  Lungs: speech is fluent sentence length suggests that patient is not short of breath and not punctuated by cough, sneezing or sniffing. [name]   Psych: affect normal.  speech is articulate and non pressured .  Denies suicidal thoughts   ASSESSMENT AND PLAN:  Discussed the following assessment and plan:  Abnormal weight loss  Mass of pancreas  Sacral decubitus ulcer, stage II (HCC)  White coat syndrome with hypertension  Abnormal weight loss Unclear if due to aging or duet to pancreatic mass. Continue supplemental nutrition drinks  Mass of pancreas Cystic,  Enlarging by serial imaging studies.  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  EUS -FNA planned for Jan 13 at [facility] in [place]  Sacral decubitus ulcer, stage II ([place]) Risk factors include unintentional weight loss,  Possible CA of pancreas.  Dermacloud apply freely,  Offloading sacral area with inflatable ring .  Follow up one week  White coat syndrome with hypertension Patient checks BP daily at one and systolics have averaged 630 to 150.  Given his age and prior medication intolerance,   No tighter control is advised     I discussed the assessment and treatment plan with the patient. The patient was provided an opportunity to ask questions and all were answered. The patient agreed with the plan and demonstrated an understanding of the instructions.   The patient was advised to call back or seek an in-person evaluation if the symptoms worsen or if the condition fails to improve as anticipated.  I provided  25 minutes of  non-face-to-face time during this encounter reviewing patient's current problems and past procedures/imaging studies,  Notes from oncologist available via Epic portal, providing counseling on the above mentioned problems , and coordination  of care .  Crecencio Mc, MD

## 2019-02-26 ENCOUNTER — Other Ambulatory Visit: Payer: Self-pay | Admitting: Internal Medicine

## 2019-03-01 ENCOUNTER — Other Ambulatory Visit: Payer: Self-pay

## 2019-03-01 ENCOUNTER — Other Ambulatory Visit
Admission: RE | Admit: 2019-03-01 | Discharge: 2019-03-01 | Disposition: A | Discharge status: Home / Self Care | Payer: PPO | Source: Ambulatory Visit | Attending: *Deleted | Admitting: *Deleted

## 2019-03-01 ENCOUNTER — Emergency Department: Payer: PPO

## 2019-03-01 ENCOUNTER — Ambulatory Visit (INDEPENDENT_AMBULATORY_CARE_PROVIDER_SITE_OTHER): Payer: PPO | Admitting: *Deleted

## 2019-03-01 DIAGNOSIS — I248 Other forms of acute ischemic heart disease: Secondary | ICD-10-CM | POA: Diagnosis present

## 2019-03-01 DIAGNOSIS — I110 Hypertensive heart disease with heart failure: Secondary | ICD-10-CM | POA: Diagnosis present

## 2019-03-01 DIAGNOSIS — I5032 Chronic diastolic (congestive) heart failure: Secondary | ICD-10-CM | POA: Diagnosis present

## 2019-03-01 DIAGNOSIS — Z86010 Personal history of colonic polyps: Secondary | ICD-10-CM

## 2019-03-01 DIAGNOSIS — R7989 Other specified abnormal findings of blood chemistry: Secondary | ICD-10-CM

## 2019-03-01 DIAGNOSIS — E538 Deficiency of other specified B group vitamins: Secondary | ICD-10-CM | POA: Diagnosis not present

## 2019-03-01 DIAGNOSIS — E785 Hyperlipidemia, unspecified: Secondary | ICD-10-CM | POA: Diagnosis present

## 2019-03-01 DIAGNOSIS — Z880 Allergy status to penicillin: Secondary | ICD-10-CM

## 2019-03-01 DIAGNOSIS — Z833 Family history of diabetes mellitus: Secondary | ICD-10-CM

## 2019-03-01 DIAGNOSIS — Z884 Allergy status to anesthetic agent status: Secondary | ICD-10-CM

## 2019-03-01 DIAGNOSIS — J8410 Pulmonary fibrosis, unspecified: Principal | ICD-10-CM | POA: Diagnosis present

## 2019-03-01 DIAGNOSIS — J9621 Acute and chronic respiratory failure with hypoxia: Secondary | ICD-10-CM | POA: Diagnosis present

## 2019-03-01 DIAGNOSIS — D473 Essential (hemorrhagic) thrombocythemia: Secondary | ICD-10-CM | POA: Diagnosis present

## 2019-03-01 DIAGNOSIS — J9601 Acute respiratory failure with hypoxia: Secondary | ICD-10-CM | POA: Diagnosis not present

## 2019-03-01 DIAGNOSIS — Z800 Family history of malignant neoplasm of digestive organs: Secondary | ICD-10-CM

## 2019-03-01 DIAGNOSIS — Z888 Allergy status to other drugs, medicaments and biological substances status: Secondary | ICD-10-CM

## 2019-03-01 DIAGNOSIS — I272 Pulmonary hypertension, unspecified: Secondary | ICD-10-CM | POA: Diagnosis present

## 2019-03-01 DIAGNOSIS — Z79899 Other long term (current) drug therapy: Secondary | ICD-10-CM

## 2019-03-01 DIAGNOSIS — Z20822 Contact with and (suspected) exposure to covid-19: Secondary | ICD-10-CM | POA: Diagnosis present

## 2019-03-01 DIAGNOSIS — M810 Age-related osteoporosis without current pathological fracture: Secondary | ICD-10-CM | POA: Diagnosis present

## 2019-03-01 DIAGNOSIS — Z8249 Family history of ischemic heart disease and other diseases of the circulatory system: Secondary | ICD-10-CM

## 2019-03-01 DIAGNOSIS — K219 Gastro-esophageal reflux disease without esophagitis: Secondary | ICD-10-CM | POA: Diagnosis present

## 2019-03-01 DIAGNOSIS — Z7982 Long term (current) use of aspirin: Secondary | ICD-10-CM

## 2019-03-01 LAB — CBC
HCT: 44.1 % (ref 39.0–52.0)
Hemoglobin: 14.9 g/dL (ref 13.0–17.0)
MCH: 33.5 pg (ref 26.0–34.0)
MCHC: 33.8 g/dL (ref 30.0–36.0)
MCV: 99.1 fL (ref 80.0–100.0)
Platelets: 595 10*3/uL — ABNORMAL HIGH (ref 150–400)
RBC: 4.45 MIL/uL (ref 4.22–5.81)
RDW: 11.7 % (ref 11.5–15.5)
WBC: 12.2 10*3/uL — ABNORMAL HIGH (ref 4.0–10.5)
nRBC: 0 % (ref 0.0–0.2)

## 2019-03-01 LAB — BASIC METABOLIC PANEL
Anion gap: 12 (ref 5–15)
BUN: 15 mg/dL (ref 8–23)
CO2: 27 mmol/L (ref 22–32)
Calcium: 9.3 mg/dL (ref 8.9–10.3)
Chloride: 92 mmol/L — ABNORMAL LOW (ref 98–111)
Creatinine, Ser: 0.85 mg/dL (ref 0.61–1.24)
GFR calc Af Amer: >60 mL/min (ref >60)
GFR calc non Af Amer: >60 mL/min (ref >60)
Glucose, Bld: 120 mg/dL — ABNORMAL HIGH (ref 70–99)
Potassium: 4.1 mmol/L (ref 3.5–5.1)
Sodium: 131 mmol/L — ABNORMAL LOW (ref 135–145)

## 2019-03-01 LAB — TROPONIN I (HIGH SENSITIVITY)
Troponin I (High Sensitivity): 25 ng/L — ABNORMAL HIGH (ref <18)
Troponin I (High Sensitivity): 27 ng/L — ABNORMAL HIGH (ref <18)

## 2019-03-01 LAB — BRAIN NATRIURETIC PEPTIDE: B Natriuretic Peptide: 115 pg/mL — ABNORMAL HIGH (ref 0.0–100.0)

## 2019-03-01 MED ORDER — CYANOCOBALAMIN 1000 MCG/ML IJ SOLN
1000.0000 ug | Freq: Once | INTRAMUSCULAR | Status: AC
  Administered 2019-03-01: 1000 ug via INTRAMUSCULAR

## 2019-03-01 MED ORDER — TESTOSTERONE CYPIONATE 200 MG/ML IM SOLN
50.0000 mg | Freq: Once | INTRAMUSCULAR | Status: AC
  Administered 2019-03-01: 50 mg via INTRAMUSCULAR

## 2019-03-01 NOTE — ED Notes (Signed)
FIRST NURSE NOTE- sent from [facility] for cough, congestion and SHOB X 1 month. Xray looked worse per [place] PA.  Sent for eval.  Has had abx previously.  sats 97% from [facility]

## 2019-03-01 NOTE — Progress Notes (Signed)
Patient presented for B 12 injection to right deltoid, patient voiced no concerns nor showed any signs of distress during injection. Patient also received testosterone injectio in RUQ tolerated well. Patient ask nurse to look at sacral pressure wound looking much better with pink epithelial tissue covering all of wound and no drainage.

## 2019-03-01 NOTE — ED Triage Notes (Signed)
Pt arrives to ED from [facility]. Pt has no complaints other than just not feeling well. Denies CP, abd pain, denies N&V&D, fever, denies weakness. [place] paperwork states flu and COVID negative. States elevated WBC and CBG with low sodium and KC wanted him to be "observed" further. Pt has prescriptions sent to his pharmacy for albuterol inhaler, doxycycline, and flonase. Pt denies pain. Wife # [phone]

## 2019-03-01 NOTE — ED Notes (Signed)
Wife updated on delays.

## 2019-03-01 NOTE — ED Notes (Signed)
Pt sitting in lobby with no distress noted; chart reviewed and protocol added

## 2019-03-02 ENCOUNTER — Inpatient Hospital Stay
Admission: EM | Admit: 2019-03-02 | Discharge: 2019-03-03 | DRG: 196 | Disposition: A | Discharge status: Home / Self Care | Payer: PPO | Attending: Internal Medicine | Admitting: Internal Medicine

## 2019-03-02 ENCOUNTER — Encounter: Payer: Self-pay | Admitting: Radiology

## 2019-03-02 ENCOUNTER — Emergency Department: Payer: PPO

## 2019-03-02 ENCOUNTER — Other Ambulatory Visit: Payer: Self-pay

## 2019-03-02 DIAGNOSIS — J9601 Acute respiratory failure with hypoxia: Secondary | ICD-10-CM

## 2019-03-02 DIAGNOSIS — E785 Hyperlipidemia, unspecified: Secondary | ICD-10-CM | POA: Diagnosis present

## 2019-03-02 DIAGNOSIS — I63412 Cerebral infarction due to embolism of left middle cerebral artery: Secondary | ICD-10-CM | POA: Diagnosis not present

## 2019-03-02 DIAGNOSIS — E871 Hypo-osmolality and hyponatremia: Secondary | ICD-10-CM | POA: Diagnosis not present

## 2019-03-02 DIAGNOSIS — I6300 Cerebral infarction due to thrombosis of unspecified precerebral artery: Secondary | ICD-10-CM | POA: Diagnosis not present

## 2019-03-02 DIAGNOSIS — Z79899 Other long term (current) drug therapy: Secondary | ICD-10-CM | POA: Diagnosis not present

## 2019-03-02 DIAGNOSIS — I639 Cerebral infarction, unspecified: Secondary | ICD-10-CM | POA: Diagnosis not present

## 2019-03-02 DIAGNOSIS — Z880 Allergy status to penicillin: Secondary | ICD-10-CM | POA: Diagnosis not present

## 2019-03-02 DIAGNOSIS — D473 Essential (hemorrhagic) thrombocythemia: Secondary | ICD-10-CM | POA: Diagnosis present

## 2019-03-02 DIAGNOSIS — I6389 Other cerebral infarction: Secondary | ICD-10-CM | POA: Diagnosis not present

## 2019-03-02 DIAGNOSIS — Z8249 Family history of ischemic heart disease and other diseases of the circulatory system: Secondary | ICD-10-CM | POA: Diagnosis not present

## 2019-03-02 DIAGNOSIS — Z833 Family history of diabetes mellitus: Secondary | ICD-10-CM | POA: Diagnosis not present

## 2019-03-02 DIAGNOSIS — Z86010 Personal history of colonic polyps: Secondary | ICD-10-CM | POA: Diagnosis not present

## 2019-03-02 DIAGNOSIS — E441 Mild protein-calorie malnutrition: Secondary | ICD-10-CM | POA: Diagnosis not present

## 2019-03-02 DIAGNOSIS — I110 Hypertensive heart disease with heart failure: Secondary | ICD-10-CM | POA: Diagnosis present

## 2019-03-02 DIAGNOSIS — I10 Essential (primary) hypertension: Secondary | ICD-10-CM

## 2019-03-02 DIAGNOSIS — I351 Nonrheumatic aortic (valve) insufficiency: Secondary | ICD-10-CM | POA: Diagnosis not present

## 2019-03-02 DIAGNOSIS — R778 Other specified abnormalities of plasma proteins: Secondary | ICD-10-CM

## 2019-03-02 DIAGNOSIS — Z800 Family history of malignant neoplasm of digestive organs: Secondary | ICD-10-CM | POA: Diagnosis not present

## 2019-03-02 DIAGNOSIS — I272 Pulmonary hypertension, unspecified: Secondary | ICD-10-CM | POA: Diagnosis present

## 2019-03-02 DIAGNOSIS — I248 Other forms of acute ischemic heart disease: Secondary | ICD-10-CM | POA: Diagnosis present

## 2019-03-02 DIAGNOSIS — Z20822 Contact with and (suspected) exposure to covid-19: Secondary | ICD-10-CM | POA: Diagnosis present

## 2019-03-02 DIAGNOSIS — I5032 Chronic diastolic (congestive) heart failure: Secondary | ICD-10-CM | POA: Diagnosis present

## 2019-03-02 DIAGNOSIS — J9621 Acute and chronic respiratory failure with hypoxia: Secondary | ICD-10-CM | POA: Diagnosis present

## 2019-03-02 DIAGNOSIS — Z888 Allergy status to other drugs, medicaments and biological substances status: Secondary | ICD-10-CM | POA: Diagnosis not present

## 2019-03-02 DIAGNOSIS — Z884 Allergy status to anesthetic agent status: Secondary | ICD-10-CM | POA: Diagnosis not present

## 2019-03-02 DIAGNOSIS — J8410 Pulmonary fibrosis, unspecified: Secondary | ICD-10-CM | POA: Diagnosis not present

## 2019-03-02 DIAGNOSIS — I361 Nonrheumatic tricuspid (valve) insufficiency: Secondary | ICD-10-CM | POA: Diagnosis not present

## 2019-03-02 DIAGNOSIS — R7989 Other specified abnormal findings of blood chemistry: Secondary | ICD-10-CM | POA: Diagnosis present

## 2019-03-02 DIAGNOSIS — K219 Gastro-esophageal reflux disease without esophagitis: Secondary | ICD-10-CM | POA: Diagnosis not present

## 2019-03-02 DIAGNOSIS — Z7982 Long term (current) use of aspirin: Secondary | ICD-10-CM | POA: Diagnosis not present

## 2019-03-02 DIAGNOSIS — M810 Age-related osteoporosis without current pathological fracture: Secondary | ICD-10-CM | POA: Diagnosis present

## 2019-03-02 LAB — URINALYSIS, COMPLETE (UACMP) WITH MICROSCOPIC
Bacteria, UA: NONE SEEN
Bilirubin Urine: NEGATIVE
Glucose, UA: NEGATIVE mg/dL
Hgb urine dipstick: NEGATIVE
Ketones, ur: NEGATIVE mg/dL
Leukocytes,Ua: NEGATIVE
Nitrite: NEGATIVE
Protein, ur: NEGATIVE mg/dL
Specific Gravity, Urine: 1.028 (ref 1.005–1.030)
Squamous Epithelial / HPF: NONE SEEN (ref 0–5)
pH: 6 (ref 5.0–8.0)

## 2019-03-02 LAB — TROPONIN I (HIGH SENSITIVITY)
Troponin I (High Sensitivity): 25 ng/L — ABNORMAL HIGH (ref <18)
Troponin I (High Sensitivity): 27 ng/L — ABNORMAL HIGH (ref <18)
Troponin I (High Sensitivity): 24 ng/L — ABNORMAL HIGH (ref <18)

## 2019-03-02 LAB — RESPIRATORY PANEL BY RT PCR (FLU A&B, COVID)
Influenza A by PCR: NEGATIVE
Influenza B by PCR: NEGATIVE
SARS Coronavirus 2 by RT PCR: NEGATIVE

## 2019-03-02 MED ORDER — FUROSEMIDE 20 MG PO TABS
20.0000 mg | ORAL_TABLET | Freq: Every day | ORAL | Status: DC
  Administered 2019-03-02 – 2019-03-03 (×2): 20 mg via ORAL
  Filled 2019-03-02 (×2): qty 1

## 2019-03-02 MED ORDER — AMLODIPINE BESYLATE 5 MG PO TABS
5.0000 mg | ORAL_TABLET | Freq: Every day | ORAL | Status: DC
  Administered 2019-03-02 – 2019-03-03 (×2): 5 mg via ORAL
  Filled 2019-03-02 (×2): qty 1

## 2019-03-02 MED ORDER — METHYLPREDNISOLONE SODIUM SUCC 40 MG IJ SOLR
40.0000 mg | Freq: Two times a day (BID) | INTRAMUSCULAR | Status: DC
  Administered 2019-03-02 – 2019-03-03 (×2): 40 mg via INTRAVENOUS
  Filled 2019-03-02 (×2): qty 1

## 2019-03-02 MED ORDER — DOCUSATE SODIUM 100 MG PO CAPS: 100.0000 mg | ORAL_CAPSULE | Freq: Two times a day (BID) | ORAL | Status: DC | PRN

## 2019-03-02 MED ORDER — DERMACLOUD EX CREA: 1.0000 "application " | TOPICAL_CREAM | CUTANEOUS | Status: DC | PRN

## 2019-03-02 MED ORDER — ASPIRIN EC 81 MG PO TBEC
81.0000 mg | DELAYED_RELEASE_TABLET | Freq: Every day | ORAL | Status: DC
  Administered 2019-03-02 – 2019-03-03 (×2): 81 mg via ORAL
  Filled 2019-03-02: qty 1

## 2019-03-02 MED ORDER — HYDRALAZINE HCL 25 MG PO TABS: 25.0000 mg | ORAL_TABLET | Freq: Three times a day (TID) | ORAL | Status: DC | PRN

## 2019-03-02 MED ORDER — IPRATROPIUM BROMIDE HFA 17 MCG/ACT IN AERS
2.0000 | INHALATION_SPRAY | RESPIRATORY_TRACT | Status: DC
  Administered 2019-03-02 – 2019-03-03 (×5): 2 via RESPIRATORY_TRACT
  Filled 2019-03-02 (×2): qty 12.9

## 2019-03-02 MED ORDER — ENOXAPARIN SODIUM 40 MG/0.4ML ~~LOC~~ SOLN
40.0000 mg | SUBCUTANEOUS | Status: DC
  Administered 2019-03-02 – 2019-03-03 (×2): 40 mg via SUBCUTANEOUS
  Filled 2019-03-02 (×2): qty 0.4

## 2019-03-02 MED ORDER — ZINC OXIDE 40 % EX OINT
TOPICAL_OINTMENT | CUTANEOUS | Status: DC | PRN
  Filled 2019-03-02: qty 113

## 2019-03-02 MED ORDER — DM-GUAIFENESIN ER 30-600 MG PO TB12
1.0000 | ORAL_TABLET | Freq: Two times a day (BID) | ORAL | Status: DC
  Administered 2019-03-03 (×2): 1 via ORAL
  Filled 2019-03-02 (×4): qty 1

## 2019-03-02 MED ORDER — AZELASTINE HCL 0.1 % NA SOLN
2.0000 | Freq: Two times a day (BID) | NASAL | Status: DC
  Administered 2019-03-02 – 2019-03-03 (×3): 2 via NASAL
  Filled 2019-03-02: qty 30

## 2019-03-02 MED ORDER — ACETAMINOPHEN 325 MG PO TABS: 650.0000 mg | ORAL_TABLET | Freq: Four times a day (QID) | ORAL | Status: DC | PRN

## 2019-03-02 MED ORDER — DOXYCYCLINE HYCLATE 100 MG PO TABS
100.0000 mg | ORAL_TABLET | Freq: Two times a day (BID) | ORAL | Status: DC
  Administered 2019-03-02 – 2019-03-03 (×3): 100 mg via ORAL
  Filled 2019-03-02 (×3): qty 1

## 2019-03-02 MED ORDER — CLOTRIMAZOLE 1 % EX CREA
TOPICAL_CREAM | Freq: Two times a day (BID) | CUTANEOUS | Status: DC | PRN
  Filled 2019-03-02: qty 15

## 2019-03-02 MED ORDER — VITAMIN B-12 1000 MCG PO TABS
5000.0000 ug | ORAL_TABLET | Freq: Every day | ORAL | Status: DC
  Administered 2019-03-02 – 2019-03-03 (×2): 5000 ug via ORAL
  Filled 2019-03-02 (×3): qty 5

## 2019-03-02 MED ORDER — VITAMIN D3 25 MCG (1000 UNIT) PO TABS
2000.0000 [IU] | ORAL_TABLET | Freq: Every day | ORAL | Status: DC
  Administered 2019-03-02 – 2019-03-03 (×2): 2000 [IU] via ORAL
  Filled 2019-03-02 (×3): qty 2

## 2019-03-02 MED ORDER — SODIUM CHLORIDE 0.9 % IV BOLUS
1000.0000 mL | Freq: Once | INTRAVENOUS | Status: AC
  Administered 2019-03-02: 1000 mL via INTRAVENOUS

## 2019-03-02 MED ORDER — PANTOPRAZOLE SODIUM 40 MG PO TBEC
40.0000 mg | DELAYED_RELEASE_TABLET | Freq: Every day | ORAL | Status: DC
  Administered 2019-03-02 – 2019-03-03 (×2): 40 mg via ORAL
  Filled 2019-03-02 (×2): qty 1

## 2019-03-02 MED ORDER — HYDRALAZINE HCL 50 MG PO TABS
50.0000 mg | ORAL_TABLET | Freq: Three times a day (TID) | ORAL | Status: DC
  Administered 2019-03-02 – 2019-03-03 (×3): 50 mg via ORAL
  Filled 2019-03-02 (×3): qty 1

## 2019-03-02 MED ORDER — HYDROXYUREA 500 MG PO CAPS
500.0000 mg | ORAL_CAPSULE | ORAL | Status: DC
  Administered 2019-03-02: 500 mg via ORAL
  Filled 2019-03-02: qty 1

## 2019-03-02 MED ORDER — LISINOPRIL 20 MG PO TABS
40.0000 mg | ORAL_TABLET | Freq: Every day | ORAL | Status: DC
  Administered 2019-03-02 – 2019-03-03 (×2): 40 mg via ORAL
  Filled 2019-03-02 (×2): qty 2

## 2019-03-02 MED ORDER — IOHEXOL 350 MG/ML SOLN
75.0000 mL | Freq: Once | INTRAVENOUS | Status: AC | PRN
  Administered 2019-03-02: 75 mL via INTRAVENOUS

## 2019-03-02 MED ORDER — ALBUTEROL SULFATE HFA 108 (90 BASE) MCG/ACT IN AERS
2.0000 | INHALATION_SPRAY | RESPIRATORY_TRACT | Status: DC | PRN
  Administered 2019-03-02: 2 via RESPIRATORY_TRACT
  Filled 2019-03-02: qty 6.7

## 2019-03-02 MED ORDER — ONDANSETRON HCL 4 MG/2ML IJ SOLN: 4.0000 mg | Freq: Three times a day (TID) | INTRAMUSCULAR | Status: DC | PRN

## 2019-03-02 MED ORDER — ACETAMINOPHEN 500 MG PO TABS
1000.0000 mg | ORAL_TABLET | Freq: Once | ORAL | Status: AC
  Administered 2019-03-02: 1000 mg via ORAL
  Filled 2019-03-02: qty 2

## 2019-03-02 NOTE — ED Notes (Signed)
Attempted to call report for this pt. Per staff on 1A, the room does not have a telemetry box for this pt and that room assignment may change. Will follow up in 15 mins.

## 2019-03-02 NOTE — H&P (Signed)
History and Physical    Jimmy Jimenez PPI:[id number] DOB: 03/13/1925 DOA: 03/02/2019  Referring MD/NP/PA:   PCP: Crecencio Mc, MD   Patient coming from:  The patient is coming from home.  At baseline, pt is independent for most of ADL.        Chief Complaint: SOB  HPI: Jimmy Jimenez is a 86 y.o. male with medical history significant of hypertension, hyperlipidemia, GERD, pulmonary hypertension, pulmonary fibrosis, essential thrombocytosis, dCHF, who presents with SOB.  Patient states that he has been having dry cough and shortness breath in the past 2 days, which has worsened, and is aggravated by exertion.  Pt also has generalized weakness.  No unilateral tingling or numbness in extremities.  No facial droop or slurred speech.  Patient does not have chest pain, fever or chills.  No nausea, vomiting, diarrhea, abdominal pain, symptoms of UTI. Patient went to [facility] clinic today where he was tested for flu which was negative. Tested for Covid on the 28th which was negative.   ED Course: pt was found to have negative RVP for covid19, troponin 25, 27, BMP 115, renal function normal, temperature normal, blood pressure 136/69, heart rate 70s, tachypnea, oxygen desaturated to 85% on room air, which improved to 98% on 2 L nasal cannula oxygen.  Chest x-ray showed chronic interstitial changes.  CT angiogram is negative for PE.  Patient is admitted to [place] bed as inpatient.  Review of Systems:   General: no fevers, chills, no body weight gain, has fatigue HEENT: no blurry vision, hearing changes or sore throat Respiratory: has dyspnea, coughing, no wheezing CV: no chest pain, no palpitations GI: no nausea, vomiting, abdominal pain, diarrhea, constipation GU: no dysuria, burning on urination, increased urinary frequency, hematuria  Ext: no leg edema Neuro: no unilateral weakness, numbness, or tingling, no vision change or hearing loss Skin: no rash, no skin tear. MSK: No muscle spasm, no  deformity, no limitation of range of movement in spin Heme: No easy bruising.  Travel history: No recent long distant travel.  Allergy:  Allergies  Allergen Reactions  . Amlodipine     swelling  . Atenolol     swelling  . Erythromycin     Other reaction(s): Hallucination  . Furosemide     Dizziness   . Hctz [Hydrochlorothiazide]     hyponatremia  . Hydrochlorothiazide W-Triamterene     Other reaction(s): Other (See Comments) Patient stated is causes low sodium Patient stated is causes low sodium Patient stated is causes low sodium  Patient stated is causes low sodium   Patient stated is causes low sodium Patient stated is causes low sodium  . Levofloxacin     Causes sleepiness  . Metoprolol     dizziness  . Micardis [Telmisartan]     Causes swelling  . Penicillins Itching    Has patient had a PCN reaction causing immediate rash, facial/tongue/throat swelling, SOB or lightheadedness with hypotension: No Has patient had a PCN reaction causing severe rash involving mucus membranes or skin necrosis: No Has patient had a PCN reaction that required hospitalization No Has patient had a PCN reaction occurring within the last 10 years: No If all of the above answers are "NO", then may proceed with Cephalosporin use.  . Terazosin     Causes swelling in legs and feet  . Triamterene-Hctz     Patient stated is causes low sodium    Past Medical History:  Diagnosis Date  . Colon polyps   . Essential  thrombocytosis ([place])   . Essential thrombocytosis ([place])   . Hyperlipidemia   . Hypertension   . Lung nodule 12/2004   found on CXR  . Osteoporosis     Past Surgical History:  Procedure Laterality Date  . BONE MARROW BIOPSY  01/18/07  . COLONOSCOPY  2007, 2010   Dr Allen Norris  . NASAL SINUS SURGERY  06/11/05    Social History:  reports that he has never smoked. He has never used smokeless tobacco. He reports that he does not drink alcohol or use drugs.  Family History:    Family History  Problem Relation Age of Onset  . Colon cancer Mother        colon age 44's  . Peripheral vascular disease Father   . Colon cancer Brother        colon age 23's  . Diabetes Other   . Prostate cancer Neg Hx   . Bladder Cancer Neg Hx   . Kidney cancer Neg Hx      Prior to Admission medications   Medication Sig Start Date End Date Taking? Authorizing Provider  amLODipine (NORVASC) 5 MG tablet TAKE 1 TABLET BY MOUTH ONCE DAILY 12/26/18  Yes Crecencio Mc, MD  aspirin EC 81 MG tablet Take 81 mg by mouth daily. Reported on 08/14/2015   Yes [provider]  azelastine (ASTELIN) 0.1 % nasal spray Place 2 sprays into both nostrils 2 (two) times daily.  02/10/19  Yes [provider]  benzonatate (TESSALON) 200 MG capsule Take 1 capsule (200 mg total) by mouth 2 (two) times daily as needed for cough. 01/04/19  Yes Crecencio Mc, MD  Calcium Citrate-Vitamin D (CITRACAL + D PO) Take 3 tablets by mouth daily. Take 2 by mouth daily, calcium 630 and vitamin D 500 each.   Yes [provider]  Cholecalciferol (VITAMIN D3) 1000 UNITS CAPS Take 2,000 Units by mouth daily. Take 2 by mouth daily   Yes [provider]  clotrimazole-betamethasone (LOTRISONE) cream Apply topically 2 (two) times daily. 11/28/18  Yes Crecencio Mc, MD  Cyanocobalamin (B-12 COMPLIANCE INJECTION) 1000 MCG/ML KIT Inject as directed.   Yes [provider]  cyanocobalamin 1000 MCG tablet Take 5,000 mcg by mouth daily.   Yes [provider]  docusate sodium (COLACE) 100 MG capsule Take 100 mg by mouth 2 (two) times daily as needed for mild constipation.   Yes [provider]  esomeprazole (NEXIUM) 40 MG capsule TAKE 1 CAPSULE BY MOUTH ONCE DAILY AT NOON 11/27/18  Yes Crecencio Mc, MD  furosemide (LASIX) 20 MG tablet TAKE 1 TABLET BY MOUTH EVERY OTHER DAY IN MORNING 10/31/18  Yes Crecencio Mc, MD  hydrALAZINE (APRESOLINE) 50 MG tablet TAKE 1 TABLET BY  MOUTH 3 TIMES DAILY 02/26/19  Yes Crecencio Mc, MD  hydroxyurea (HYDREA) 500 MG capsule TAKE 1 CAPSULE BY MOUTH EVERY OTHER DAY MAY TAKE WITH FOOD TO MINIMIZE GI SIDE EFFECTS. 08/08/18  Yes Cammie Sickle, MD  [employer] [facility]) CREA Apply 1 application topically as needed (after each bowel movement for barrier for  pressure ulcer.). 02/15/19  Yes Crecencio Mc, MD  lisinopril (ZESTRIL) 40 MG tablet TAKE 1 TABLET BY MOUTH ONCE DAILY 09/15/18  Yes Crecencio Mc, MD  testosterone cypionate (DEPOTESTOSTERONE CYPIONATE) 200 MG/ML injection INJECT 0.25 ML INTRAMUSCULARLY EVERY 14 DAYS AS DIRECTED 02/16/19  Yes Crecencio Mc, MD    Physical Exam: Vitals:   03/02/19 1230 03/02/19 1300  03/02/19 1333 03/02/19 1406  BP: (!) 153/71 (!) 153/59 (!) 180/73   Pulse: 77 70 100   Resp: (!) 23 (!) 27 17   Temp:    97.8 F (36.6 C)  TempSrc:    Oral  SpO2: 99% 100% 99% 100%  Weight:      Height:       General: Not in acute distress HEENT:       Eyes: PERRL, EOMI, no scleral icterus.       ENT: No discharge from the ears and nose, no pharynx injection, no tonsillar enlargement.        Neck: No JVD, no bruit, no mass felt. Heme: No neck lymph node enlargement. Cardiac: S1/S2, RRR, No murmurs, No gallops or rubs. Respiratory: Decreased air movement bilaterally GI: Soft, nondistended, nontender, no rebound pain, no organomegaly, BS present. GU: No hematuria Ext: No pitting leg edema bilaterally. 2+DP/PT pulse bilaterally. Musculoskeletal: No joint deformities, No joint redness or warmth, no limitation of ROM in spin. Skin: No rashes.  Neuro: Alert, oriented X3, cranial nerves II-XII grossly intact, moves all extremities normally.  Psych: Patient is not psychotic, no suicidal or hemocidal ideation.  Labs on Admission: I have personally reviewed following labs and imaging studies  CBC: Recent Labs  Lab 03/01/19 1559  WBC 12.2*  HGB 14.9  HCT 44.1  MCV 99.1  PLT 595*     Basic Metabolic Panel: Recent Labs  Lab 03/01/19 1559  NA 131*  K 4.1  CL 92*  CO2 27  GLUCOSE 120*  BUN 15  CREATININE 0.85  CALCIUM 9.3   GFR: Estimated Creatinine Clearance: 52.7 mL/min (by C-G formula based on SCr of 0.85 mg/dL). Liver Function Tests: No results for input(s): AST, ALT, ALKPHOS, BILITOT, PROT, ALBUMIN in the last 168 hours. No results for input(s): LIPASE, AMYLASE in the last 168 hours. No results for input(s): AMMONIA in the last 168 hours. Coagulation Profile: No results for input(s): INR, PROTIME in the last 168 hours. Cardiac Enzymes: No results for input(s): CKTOTAL, CKMB, CKMBINDEX, TROPONINI in the last 168 hours. BNP (last 3 results) No results for input(s): PROBNP in the last 8760 hours. HbA1C: No results for input(s): HGBA1C in the last 72 hours. CBG: No results for input(s): GLUCAP in the last 168 hours. Lipid Profile: No results for input(s): CHOL, HDL, LDLCALC, TRIG, CHOLHDL, LDLDIRECT in the last 72 hours. Thyroid Function Tests: No results for input(s): TSH, T4TOTAL, FREET4, T3FREE, THYROIDAB in the last 72 hours. Anemia Panel: No results for input(s): VITAMINB12, FOLATE, FERRITIN, TIBC, IRON, RETICCTPCT in the last 72 hours. Urine analysis:    Component Value Date/Time   COLORURINE YELLOW (A) 03/02/2019 0450   APPEARANCEUR CLEAR (A) 03/02/2019 0450   APPEARANCEUR Clear 03/10/2017 1324   LABSPEC 1.028 03/02/2019 0450   PHURINE 6.0 03/02/2019 0450   GLUCOSEU NEGATIVE 03/02/2019 0450   GLUCOSEU NEGATIVE 08/29/2014 1050   HGBUR NEGATIVE 03/02/2019 0450   BILIRUBINUR NEGATIVE 03/02/2019 0450   BILIRUBINUR Negative 03/10/2017 1324   KETONESUR NEGATIVE 03/02/2019 0450   PROTEINUR NEGATIVE 03/02/2019 0450   UROBILINOGEN 0.2 08/29/2014 1050   NITRITE NEGATIVE 03/02/2019 0450   LEUKOCYTESUR NEGATIVE 03/02/2019 0450   Sepsis Labs: _0 (procalcitonin:4,lacticidven:4) ) Recent Results (from the past 240 hour(s))  Respiratory  Panel by RT PCR (Flu A&B, Covid) - Urine, Clean Catch     Status: None   Collection Time: 03/02/19  4:49 AM   Specimen: Urine, Clean Catch  Result Value Ref Range Status  SARS Coronavirus 2 by RT PCR NEGATIVE NEGATIVE Final    Comment: (NOTE) SARS-CoV-2 target nucleic acids are NOT DETECTED. The SARS-CoV-2 RNA is generally detectable in upper respiratoy specimens during the acute phase of infection. The lowest concentration of SARS-CoV-2 viral copies this assay can detect is 131 copies/mL. A negative result does not preclude SARS-Cov-2 infection and should not be used as the sole basis for treatment or other patient management decisions. A negative result may occur with  improper specimen collection/handling, submission of specimen other than nasopharyngeal swab, presence of viral mutation(s) within the areas targeted by this assay, and inadequate number of viral copies (<131 copies/mL). A negative result must be combined with clinical observations, patient history, and epidemiological information. The expected result is Negative. Fact Sheet for Patients:  PinkCheek.be Fact Sheet for Healthcare Providers:  GravelBags.it This test is not yet ap proved or cleared by the [place] FDA and  has been authorized for detection and/or diagnosis of SARS-CoV-2 by FDA under an Emergency Use Authorization (EUA). This EUA will remain  in effect (meaning this test can be used) for the duration of the COVID-19 declaration under Section 564(b)(1) of the Act, 21 U.S.C. section 360bbb-3(b)(1), unless the authorization is terminated or revoked sooner.    Influenza A by PCR NEGATIVE NEGATIVE Final   Influenza B by PCR NEGATIVE NEGATIVE Final    Comment: (NOTE) The Xpert Xpress SARS-CoV-2/FLU/RSV assay is intended as an aid in  the diagnosis of influenza from Nasopharyngeal swab specimens and  should not be used as a sole basis for  treatment. Nasal washings and  aspirates are unacceptable for Xpert Xpress SARS-CoV-2/FLU/RSV  testing. Fact Sheet for Patients: PinkCheek.be Fact Sheet for Healthcare Providers: GravelBags.it This test is not yet approved or cleared by the [place] FDA and  has been authorized for detection and/or diagnosis of SARS-CoV-2 by  FDA under an Emergency Use Authorization (EUA). This EUA will remain  in effect (meaning this test can be used) for the duration of the  Covid-19 declaration under Section 564(b)(1) of the Act, 21  U.S.C. section 360bbb-3(b)(1), unless the authorization is  terminated or revoked. Performed at [facility], [address], [place], [place] [address]      Radiological Exams on Admission: DG Chest 2 View  Result Date: 03/01/2019 CLINICAL DATA:  Shortness of breath, cough EXAM: CHEST - 2 VIEW COMPARISON:  09/28/2018 FINDINGS: Stable cardiomediastinal contours. Calcified thoracic aorta. Chronic interstitial lung changes without definite new superimposed airspace opacity. No pleural effusion or pneumothorax. IMPRESSION: Chronic interstitial lung changes without definite superimposed airspace opacity Electronically Signed   By: Davina Poke D.O.   On: 03/01/2019 16:48   CT Angio Chest PE W and/or Wo Contrast  Result Date: 03/02/2019 CLINICAL DATA:  Shortness of breath. EXAM: CT ANGIOGRAPHY CHEST WITH CONTRAST TECHNIQUE: Multidetector CT imaging of the chest was performed using the standard protocol during bolus administration of intravenous contrast. Multiplanar CT image reconstructions and MIPs were obtained to evaluate the vascular anatomy. CONTRAST:  23m OMNIPAQUE IOHEXOL 350 MG/ML SOLN COMPARISON:  Radiograph yesterday. Chest CT 04/20/2016 had an outside institution. FINDINGS: Cardiovascular: There are no filling defects within the pulmonary arteries to suggest pulmonary embolus. Mild prominence  of the left and right main pulmonary arteries measuring 2.7 and 2.8 cm respectively. Aortic atherosclerosis and tortuosity. No aortic aneurysm or dissection. Coronary artery calcifications. Normal heart size with slight right heart dilatation. Contrast refluxes into the hepatic veins and IVC. Mediastinum/Nodes: Calcified mediastinal and hilar  lymph nodes. No noncalcified adenopathy. No esophageal wall thickening. No suspicious thyroid nodule. Lungs/Pleura: Bilateral subpleural reticulation in a basilar predominant distribution, similar to prior exam. Slight increased density within the dependent right greater than left lower lobe reticulation may represent pulmonary microlithiasis, this is also unchanged. No acute airspace disease. No pulmonary edema or pleural effusion. Trachea and mainstem bronchi are patent. No pulmonary mass. Upper Abdomen: Contrast refluxes into the hepatic veins and IVC. No other acute finding. Musculoskeletal: There are no acute or suspicious osseous abnormalities. Review of the MIP images confirms the above findings. IMPRESSION: 1. No pulmonary embolus. 2. Contrast reflux into the hepatic veins and IVC, suggesting elevated right heart pressures. 3. Unchanged chronic interstitial lung disease since 2018, likely pulmonary fibrosis/UIP. Aortic Atherosclerosis (ICD10-I70.0). Electronically Signed   By: Keith Rake M.D.   On: 03/02/2019 03:06     EKG: Independently reviewed.  Sinus rhythm, QTC 415, LAD, LAE, LVH, poor R wave progression Assessment/Plan Principal Problem:   Acute on chronic respiratory failure with hypoxia (HCC) Active Problems:   Essential thrombocytosis (HCC)   Pulmonary hypertension, mild (HCC)   Pulmonary fibrosis (HCC)   GERD (gastroesophageal reflux disease)   Chronic diastolic CHF (congestive heart failure) (HCC)   Elevated troponin   Acute on chronic respiratory failure with hypoxia possiblely due to pulmonary fibrosis flare: CTA negative for PE. RVP  negative for covid 19.  -will admit patient to med-surg bed as inpt -will place on tele bed for obs -Inhaler: Atrovent inhaler, prn Albuterol or Xopenex inhaler -Solu-Medrol 40 mg IV tid -Doxycycline -Mucinex for cough  -Incentive spirometry -Follow up blood culture x2 -Nasal cannula oxygen as needed to maintain O2 saturation 93% or greater  HTN:  -Continue home medications: Amlodipine, lisinopril, hydralazine -hydralazine prn  GERD (gastroesophageal reflux disease): -PPI  Chronic diastolic CHF in the setting of Pulmonary hypertension, mild: no leg edema. No pulmonary edema chest x-ray.  BNP 115. -Continue home Lasix 20 mg every other day  Elevated troponin: Troponin is minimally elevated 25 --> 27.  No chest pain.  Likely due to demand ischemia. -Trend troponin -on ASA -Check A1c, FLP -repeat EKG in the morning  Essential thrombocytosis: -pt is due to start hydroxyurea, which is started   Inpatient status:  # Patient requires inpatient status due to high intensity of service, high risk for further deterioration and high frequency of surveillance required.  I certify that at the point of admission it is my clinical judgment that the patient will require inpatient hospital care spanning beyond 2 midnights from the point of admission.  . This patient has multiple chronic comorbidities including hypertension, hyperlipidemia, GERD, pulmonary hypertension, pulmonary fibrosis, essential thrombocytosis, dCHF .  [name] Now patient has presenting with acute on chronic respiratory failure with hypoxia possiblely due to pulmonary fibrosis flare. Also has elevated troponin . The worrisome physical exam findings include decreased air movement bilaterally on auscultation . The initial radiographic and laboratory data are worrisome because of elevated troponin, leukocytosis . Current medical needs: please see my assessment and plan . Predictability of an adverse outcome (risk): Patient has  multiple comorbidities as listed above.  Now presents with acute on chronic respiratory failure with hypoxia possiblely due to pulmonary fibrosis flare. Also has elevated troponin. His presentation is highly complicated.  Patient is at high risk of deteriorating.  Will need to be treated in hospital for at least 2 days.      DVT ppx: SQ Lovenox Code Status: Full code Family Communication: None  at bed side.  Disposition Plan:  Anticipate discharge back to previous home environment Consults called:  none Admission status: Med-surg bed as inpt     Date of Service 03/02/2019    Ivor Costa [facility]   If 7PM-7AM, please contact night-coverage [email or web address] Password [facility] 03/02/2019, 4:50 PM

## 2019-03-02 NOTE — ED Notes (Signed)
Pt walked very well down the hall stats staying at 98-100 o2 When pt arrived back to bed his o2 dropped to 85-88. 2 liters placed back on pt and o2 stats increase  Dr v noted that pt will be admitted for night   Lm edt

## 2019-03-02 NOTE — ED Provider Notes (Signed)
[facility] Emergency Department Provider Note  ____________________________________________  Time seen: Approximately 12:42 AM  I have reviewed the triage vital signs and the nursing notes.   HISTORY  Chief Complaint Shortness of Breath   HPI Jimmy Jimenez is a 86 y.o. male with a history of pulmonary fibrosis, pulmonary hypertension, essential thrombocytosis, hypertension, hyperlipidemia who presents for evaluation generalized weakness.  Patient reports 2 days of a dry cough and mild shortness of breath with ambulation.  Has had progressively worsening weakness and difficulty walking.  No fever.  He is complaining of body aches, no sore throat, loss of taste or smell, known exposure to Covid, chest pain, headache, abdominal pain, nausea, vomiting, diarrhea, dysuria.  Patient went to [facility] clinic today where he was tested for flu which was negative. Tested for Covid on the 28th and also negative.   Past Medical History:  Diagnosis Date  . Colon polyps   . Essential thrombocytosis ([place])   . Essential thrombocytosis ([place])   . Hyperlipidemia   . Hypertension   . Lung nodule 12/2004   found on CXR  . Osteoporosis     Patient Active Problem List   Diagnosis Date Noted  . Sacral decubitus ulcer, stage II ([place]) 02/19/2019  . Cough present for greater than 3 weeks 10/27/2018  . Chest pain 09/30/2018  . Rhinitis, chronic 11/20/2017  . Mass of pancreas 04/23/2016  . B12 deficiency 08/17/2015  . Testicular insufficiency 08/06/2015  . Low serum testosterone 05/20/2015  . Chronic constipation 03/31/2015  . Pulmonary fibrosis ([place]) 12/12/2014  . Rectal bleeding 12/12/2014  . Abnormal weight loss 08/20/2014  . Xerosis of skin 12/18/2013  . Medicare annual wellness visit, subsequent 09/28/2013  . Statin intolerance 09/26/2013  . Pulmonary hypertension, mild ([place]) 12/14/2012  . Rhinitis, nonallergic 12/14/2012  . Solitary pulmonary nodule 10/16/2012  .  Heart failure, diastolic, due to HTN ([place]) 10/16/2012  . Bradycardia, sinus 09/02/2012  . Hyponatremia 12/01/2011  . Edema of both legs 10/30/2011  . Essential thrombocytosis ([place])   . Hyperlipidemia   . White coat syndrome with hypertension   . Osteoporosis     Past Surgical History:  Procedure Laterality Date  . BONE MARROW BIOPSY  01/18/07  . COLONOSCOPY  2007, 2010   Dr Allen Norris  . NASAL SINUS SURGERY  06/11/05    Prior to Admission medications   Medication Sig Start Date End Date Taking? Authorizing Provider  amLODipine (NORVASC) 5 MG tablet TAKE 1 TABLET BY MOUTH ONCE DAILY 12/26/18   Crecencio Mc, MD  aspirin EC 81 MG tablet Take 81 mg by mouth daily. Reported on 08/14/2015    [provider]  azelastine (ASTELIN) 0.1 % nasal spray  02/10/19   [provider]  benzonatate (TESSALON) 200 MG capsule Take 1 capsule (200 mg total) by mouth 2 (two) times daily as needed for cough. 01/04/19   Crecencio Mc, MD  Calcium Citrate-Vitamin D (CITRACAL + D PO) Take 3 tablets by mouth daily. Take 2 by mouth daily, calcium 630 and vitamin D 500 each.    [provider]  Cholecalciferol (VITAMIN D3) 1000 UNITS CAPS Take 2,000 Units by mouth daily. Take 2 by mouth daily    [provider]  clotrimazole-betamethasone (LOTRISONE) cream Apply topically 2 (two) times daily. 11/28/18   Crecencio Mc, MD  Cyanocobalamin (B-12 COMPLIANCE INJECTION) 1000 MCG/ML KIT Inject as directed.    [provider]  docusate sodium (COLACE) 100 MG capsule Take 100  mg by mouth 2 (two) times daily as needed for mild constipation.    [provider]  esomeprazole (NEXIUM) 40 MG capsule TAKE 1 CAPSULE BY MOUTH ONCE DAILY AT NOON 11/27/18   Crecencio Mc, MD  furosemide (LASIX) 20 MG tablet TAKE 1 TABLET BY MOUTH EVERY OTHER DAY IN MORNING 10/31/18   Crecencio Mc, MD  hydrALAZINE (APRESOLINE) 50 MG tablet TAKE 1 TABLET BY MOUTH 3 TIMES DAILY 02/26/19   Crecencio Mc, MD  hydroxyurea (HYDREA) 500 MG capsule TAKE 1 CAPSULE BY MOUTH EVERY OTHER DAY MAY TAKE WITH FOOD TO MINIMIZE GI SIDE EFFECTS. 08/08/18   Cammie Sickle, MD  Infant Care Products ([place]) CREA Apply 1 application topically as needed (after each bowel movement for barrier for  pressure ulcer.). 02/15/19   Crecencio Mc, MD  lisinopril (ZESTRIL) 40 MG tablet TAKE 1 TABLET BY MOUTH ONCE DAILY 09/15/18   Crecencio Mc, MD  testosterone cypionate (DEPOTESTOSTERONE CYPIONATE) 200 MG/ML injection INJECT 0.25 ML INTRAMUSCULARLY EVERY 14 DAYS AS DIRECTED 02/16/19   Crecencio Mc, MD    Allergies Amlodipine, Atenolol, Erythromycin, Furosemide, Hctz [hydrochlorothiazide], Hydrochlorothiazide w-triamterene, Levofloxacin, Metoprolol, Micardis [telmisartan], Penicillins, Terazosin, and Triamterene-hctz  Family History  Problem Relation Age of Onset  . Colon cancer Mother        colon age 15's  . Peripheral vascular disease Father   . Colon cancer Brother        colon age 76's  . Diabetes Other   . Prostate cancer Neg Hx   . Bladder Cancer Neg Hx   . Kidney cancer Neg Hx     Social History Social History   Tobacco Use  . Smoking status: Never Smoker  . Smokeless tobacco: Never Used  Substance Use Topics  . Alcohol use: No  . Drug use: No    Review of Systems  Constitutional: Negative for fever. + generalized weakness Eyes: Negative for visual changes. ENT: Negative for sore throat. Neck: No neck pain  Cardiovascular: Negative for chest pain. Respiratory: + shortness of breath, cough Gastrointestinal: Negative for abdominal pain, vomiting or diarrhea. Genitourinary: Negative for dysuria. Musculoskeletal: Negative for back pain. Skin: Negative for rash. Neurological: Negative for headaches, weakness or numbness. Psych: No SI or HI  ____________________________________________   PHYSICAL EXAM:  VITAL SIGNS: ED Triage Vitals  Enc Vitals Group     BP  03/01/19 1553 (!) 155/58     Pulse Rate 03/01/19 1553 69     Resp 03/01/19 1553 16     Temp 03/01/19 1553 98 F (36.7 C)     Temp Source 03/01/19 1553 Oral     SpO2 03/01/19 1553 91 %     Weight 03/01/19 1557 145 lb (65.8 kg)     Height 03/01/19 1557 5' 8"  (1.727 m)     Head Circumference --      Peak Flow --      Pain Score 03/01/19 1557 0     Pain Loc --      Pain Edu? --      Excl. in [place]? --     Constitutional: Alert and oriented, frail, weak, unable to transfer from wheelchair to bed without assistance. HEENT:      Head: Normocephalic and atraumatic.         Eyes: Conjunctivae are normal. Sclera is non-icteric.       Mouth/Throat: Mucous membranes are moist.       Neck: Supple with no signs of  meningismus. Cardiovascular: Regular rate and rhythm. No murmurs, gallops, or rubs. 2+ symmetrical distal pulses are present in all extremities. No JVD. Respiratory: Normal respiratory effort. Lungs are clear to auscultation bilaterally. No wheezes, crackles, or rhonchi.  Gastrointestinal: Soft, non tender, and non distended with positive bowel sounds. No rebound or guarding. Musculoskeletal: Nontender with normal range of motion in all extremities. No edema, cyanosis, or erythema of extremities. Neurologic: Normal speech and language. Face is symmetric. Moving all extremities. No gross focal neurologic deficits are appreciated. Skin: Skin is warm, dry and intact. No rash noted. Psychiatric: Mood and affect are normal. Speech and behavior are normal.  ____________________________________________   LABS (all labs ordered are listed, but only abnormal results are displayed)  Labs Reviewed  BASIC METABOLIC PANEL - Abnormal; Notable for the following components:      Result Value   Sodium 131 (*)    Chloride 92 (*)    Glucose, Bld 120 (*)    All other components within normal limits  CBC - Abnormal; Notable for the following components:   WBC 12.2 (*)    Platelets 595 (*)    All  other components within normal limits  URINALYSIS, COMPLETE (UACMP) WITH MICROSCOPIC - Abnormal; Notable for the following components:   Color, Urine YELLOW (*)    APPearance CLEAR (*)    All other components within normal limits  TROPONIN I (HIGH SENSITIVITY) - Abnormal; Notable for the following components:   Troponin I (High Sensitivity) 25 (*)    All other components within normal limits  TROPONIN I (HIGH SENSITIVITY) - Abnormal; Notable for the following components:   Troponin I (High Sensitivity) 27 (*)    All other components within normal limits  RESPIRATORY PANEL BY RT PCR (FLU A&B, COVID)   ____________________________________________  EKG  ED ECG REPORT I, Rudene Re, the attending physician, personally viewed and interpreted this ECG.  Normal sinus rhythm, rate of 84, normal intervals, left axis deviation, LVH, no ST elevations or depressions.  No significant changes when compared to prior. ____________________________________________  RADIOLOGY  I have personally reviewed the images performed during this visit and I agree with the Radiologist's read.   Interpretation by Radiologist:  DG Chest 2 View  Result Date: 03/01/2019 CLINICAL DATA:  Shortness of breath, cough EXAM: CHEST - 2 VIEW COMPARISON:  09/28/2018 FINDINGS: Stable cardiomediastinal contours. Calcified thoracic aorta. Chronic interstitial lung changes without definite new superimposed airspace opacity. No pleural effusion or pneumothorax. IMPRESSION: Chronic interstitial lung changes without definite superimposed airspace opacity Electronically Signed   By: Davina Poke D.O.   On: 03/01/2019 16:48   CT Angio Chest PE W and/or Wo Contrast  Result Date: 03/02/2019 CLINICAL DATA:  Shortness of breath. EXAM: CT ANGIOGRAPHY CHEST WITH CONTRAST TECHNIQUE: Multidetector CT imaging of the chest was performed using the standard protocol during bolus administration of intravenous contrast. Multiplanar CT  image reconstructions and MIPs were obtained to evaluate the vascular anatomy. CONTRAST:  73m OMNIPAQUE IOHEXOL 350 MG/ML SOLN COMPARISON:  Radiograph yesterday. Chest CT 04/20/2016 had an outside institution. FINDINGS: Cardiovascular: There are no filling defects within the pulmonary arteries to suggest pulmonary embolus. Mild prominence of the left and right main pulmonary arteries measuring 2.7 and 2.8 cm respectively. Aortic atherosclerosis and tortuosity. No aortic aneurysm or dissection. Coronary artery calcifications. Normal heart size with slight right heart dilatation. Contrast refluxes into the hepatic veins and IVC. Mediastinum/Nodes: Calcified mediastinal and hilar lymph nodes. No noncalcified adenopathy. No esophageal wall thickening. No suspicious  thyroid nodule. Lungs/Pleura: Bilateral subpleural reticulation in a basilar predominant distribution, similar to prior exam. Slight increased density within the dependent right greater than left lower lobe reticulation may represent pulmonary microlithiasis, this is also unchanged. No acute airspace disease. No pulmonary edema or pleural effusion. Trachea and mainstem bronchi are patent. No pulmonary mass. Upper Abdomen: Contrast refluxes into the hepatic veins and IVC. No other acute finding. Musculoskeletal: There are no acute or suspicious osseous abnormalities. Review of the MIP images confirms the above findings. IMPRESSION: 1. No pulmonary embolus. 2. Contrast reflux into the hepatic veins and IVC, suggesting elevated right heart pressures. 3. Unchanged chronic interstitial lung disease since 2018, likely pulmonary fibrosis/UIP. Aortic Atherosclerosis (ICD10-I70.0). Electronically Signed   By: Keith Rake M.D.   On: 03/02/2019 03:06     ____________________________________________   PROCEDURES  Procedure(s) performed: None Procedures Critical Care performed: yes  CRITICAL CARE Performed by: Rudene Re  ?  Total critical  care time: 30 min  Critical care time was exclusive of separately billable procedures and treating other patients.  Critical care was necessary to treat or prevent imminent or life-threatening deterioration.  Critical care was time spent personally by me on the following activities: development of treatment plan with patient and/or surrogate as well as nursing, discussions with consultants, evaluation of patient's response to treatment, examination of patient, obtaining history from patient or surrogate, ordering and performing treatments and interventions, ordering and review of laboratory studies, ordering and review of radiographic studies, pulse oximetry and re-evaluation of patient's condition.  ____________________________________________   INITIAL IMPRESSION / ASSESSMENT AND PLAN / ED COURSE  86 y.o. male with a history of pulmonary fibrosis, pulmonary hypertension, essential thrombocytosis, hypertension, hyperlipidemia who presents for evaluation generalized weakness, cough, and SOB x 2 days.  Patient is extremely frail and unable to transfer from wheelchair to bed without assistance.  His motor exam does not show any unilateral deficits.  Normal work of breathing with normal sats.    DDX worsening pulmonary fibrosis, worsening pulmonary hypertension, Covid versus viral illness versus pneumonia versus PE versus anemia versus urinary tract infection.  Clinically no signs of stroke.  Chest x-ray negative for pneumonia.  EKG with no ischemia. HS trop x 2 unchanged. Tested for COVID and flu today and negative.  Labs show no significant anemia.  Mild leukocytosis with white count of 12.2 and thrombocytosis most likely reactive in the setting of viral illness. UA pending. CTA pending.  Will give IV fluids for possible mild dehydration and reassess    Clinical Course as of Mar 01 530  Fri Mar 02, 2019  0437 Patient reports feeling markedly improved after receiving IV fluids, able to ambulate  without any assistance.  However patient sats did drop to 85% with ambulation.  He was placed on 2 L nasal cannula.  CT only shows chronic changes of pulmonary fibrosis which is most likely the cause of patient's symptoms with possible superimposed viral illness.  Will swab him again for Covid and admit to the hospitalist service   [CV]    Clinical Course User Index [CV] Alfred Levins [place], MD      As part of my medical decision making, I reviewed the following data within the [facility] notes reviewed and incorporated, Labs reviewed , EKG interpreted , Old EKG reviewed, Old chart reviewed, Radiograph reviewed , Discussed with admitting physician , Notes from prior ED visits and  Controlled Substance Database   Please note:  Patient was evaluated in  Emergency Department today for the symptoms described in the history of present illness. Patient was evaluated in the context of the global COVID-19 pandemic, which necessitated consideration that the patient might be at risk for infection with the SARS-CoV-2 virus that causes COVID-19. Institutional protocols and algorithms that pertain to the evaluation of patients at risk for COVID-19 are in a state of rapid change based on information released by regulatory bodies including the CDC and federal and state organizations. These policies and algorithms were followed during the patient's care in the ED.  Some ED evaluations and interventions may be delayed as a result of limited staffing during the pandemic.   ____________________________________________   FINAL CLINICAL IMPRESSION(S) / ED DIAGNOSES   Final diagnoses:  Acute respiratory failure with hypoxia ([place])      NEW MEDICATIONS STARTED DURING THIS VISIT:  ED Discharge Orders    None       Note:  This document was prepared using Dragon voice recognition software and may include unintentional dictation errors.    Alfred Levins, [place], MD 03/02/19 [phone]

## 2019-03-02 NOTE — ED Notes (Signed)
Report received from [place], [place]

## 2019-03-03 ENCOUNTER — Emergency Department (HOSPITAL_COMMUNITY): Payer: No Typology Code available for payment source

## 2019-03-03 ENCOUNTER — Emergency Department (HOSPITAL_COMMUNITY): Payer: No Typology Code available for payment source | Admitting: Anesthesiology

## 2019-03-03 ENCOUNTER — Encounter (HOSPITAL_COMMUNITY): Payer: Self-pay | Admitting: Emergency Medicine

## 2019-03-03 ENCOUNTER — Inpatient Hospital Stay (HOSPITAL_COMMUNITY)
Admission: EM | Admit: 2019-03-03 | Discharge: 2019-03-08 | DRG: 024 | Disposition: A | Discharge status: Home Health | Payer: No Typology Code available for payment source | Attending: Neurology | Admitting: Neurology

## 2019-03-03 ENCOUNTER — Encounter (HOSPITAL_COMMUNITY)
Admission: EM | Disposition: A | Discharge status: Home Health | Payer: Self-pay | Source: Home / Self Care | Attending: Neurology

## 2019-03-03 ENCOUNTER — Telehealth: Payer: Self-pay | Admitting: Interventional Radiology

## 2019-03-03 DIAGNOSIS — I729 Aneurysm of unspecified site: Secondary | ICD-10-CM | POA: Diagnosis not present

## 2019-03-03 DIAGNOSIS — R29706 NIHSS score 6: Secondary | ICD-10-CM | POA: Diagnosis present

## 2019-03-03 DIAGNOSIS — I5032 Chronic diastolic (congestive) heart failure: Secondary | ICD-10-CM | POA: Diagnosis present

## 2019-03-03 DIAGNOSIS — M810 Age-related osteoporosis without current pathological fracture: Secondary | ICD-10-CM | POA: Diagnosis present

## 2019-03-03 DIAGNOSIS — Z8249 Family history of ischemic heart disease and other diseases of the circulatory system: Secondary | ICD-10-CM

## 2019-03-03 DIAGNOSIS — Z800 Family history of malignant neoplasm of digestive organs: Secondary | ICD-10-CM

## 2019-03-03 DIAGNOSIS — L8990 Pressure ulcer of unspecified site, unspecified stage: Secondary | ICD-10-CM | POA: Diagnosis present

## 2019-03-03 DIAGNOSIS — D72829 Elevated white blood cell count, unspecified: Secondary | ICD-10-CM | POA: Diagnosis not present

## 2019-03-03 DIAGNOSIS — E785 Hyperlipidemia, unspecified: Secondary | ICD-10-CM | POA: Diagnosis present

## 2019-03-03 DIAGNOSIS — K219 Gastro-esophageal reflux disease without esophagitis: Secondary | ICD-10-CM

## 2019-03-03 DIAGNOSIS — I639 Cerebral infarction, unspecified: Secondary | ICD-10-CM

## 2019-03-03 DIAGNOSIS — I63412 Cerebral infarction due to embolism of left middle cerebral artery: Principal | ICD-10-CM | POA: Diagnosis present

## 2019-03-03 DIAGNOSIS — J8410 Pulmonary fibrosis, unspecified: Secondary | ICD-10-CM | POA: Diagnosis present

## 2019-03-03 DIAGNOSIS — Z8719 Personal history of other diseases of the digestive system: Secondary | ICD-10-CM

## 2019-03-03 DIAGNOSIS — I10 Essential (primary) hypertension: Secondary | ICD-10-CM

## 2019-03-03 DIAGNOSIS — Z7982 Long term (current) use of aspirin: Secondary | ICD-10-CM

## 2019-03-03 DIAGNOSIS — R4701 Aphasia: Secondary | ICD-10-CM | POA: Diagnosis present

## 2019-03-03 DIAGNOSIS — D62 Acute posthemorrhagic anemia: Secondary | ICD-10-CM | POA: Diagnosis not present

## 2019-03-03 DIAGNOSIS — G8191 Hemiplegia, unspecified affecting right dominant side: Secondary | ICD-10-CM | POA: Diagnosis present

## 2019-03-03 DIAGNOSIS — E441 Mild protein-calorie malnutrition: Secondary | ICD-10-CM | POA: Diagnosis present

## 2019-03-03 DIAGNOSIS — I110 Hypertensive heart disease with heart failure: Secondary | ICD-10-CM | POA: Diagnosis present

## 2019-03-03 DIAGNOSIS — Z833 Family history of diabetes mellitus: Secondary | ICD-10-CM

## 2019-03-03 DIAGNOSIS — L89152 Pressure ulcer of sacral region, stage 2: Secondary | ICD-10-CM | POA: Diagnosis present

## 2019-03-03 DIAGNOSIS — I272 Pulmonary hypertension, unspecified: Secondary | ICD-10-CM | POA: Diagnosis present

## 2019-03-03 DIAGNOSIS — R4702 Dysphasia: Secondary | ICD-10-CM | POA: Diagnosis present

## 2019-03-03 DIAGNOSIS — Z79899 Other long term (current) drug therapy: Secondary | ICD-10-CM

## 2019-03-03 DIAGNOSIS — D473 Essential (hemorrhagic) thrombocythemia: Secondary | ICD-10-CM | POA: Diagnosis present

## 2019-03-03 DIAGNOSIS — Z20822 Contact with and (suspected) exposure to covid-19: Secondary | ICD-10-CM | POA: Diagnosis present

## 2019-03-03 DIAGNOSIS — E871 Hypo-osmolality and hyponatremia: Secondary | ICD-10-CM | POA: Diagnosis not present

## 2019-03-03 DIAGNOSIS — I724 Aneurysm of artery of lower extremity: Secondary | ICD-10-CM | POA: Diagnosis present

## 2019-03-03 DIAGNOSIS — J9601 Acute respiratory failure with hypoxia: Secondary | ICD-10-CM

## 2019-03-03 HISTORY — PX: RADIOLOGY WITH ANESTHESIA: SHX6223

## 2019-03-03 LAB — BASIC METABOLIC PANEL
Anion gap: 6 (ref 5–15)
BUN: 14 mg/dL (ref 8–23)
CO2: 30 mmol/L (ref 22–32)
Calcium: 8.6 mg/dL — ABNORMAL LOW (ref 8.9–10.3)
Chloride: 96 mmol/L — ABNORMAL LOW (ref 98–111)
Creatinine, Ser: 0.78 mg/dL (ref 0.61–1.24)
GFR calc Af Amer: >60 mL/min (ref >60)
GFR calc non Af Amer: >60 mL/min (ref >60)
Glucose, Bld: 139 mg/dL — ABNORMAL HIGH (ref 70–99)
Potassium: 3.8 mmol/L (ref 3.5–5.1)
Sodium: 132 mmol/L — ABNORMAL LOW (ref 135–145)

## 2019-03-03 LAB — LIPID PANEL
Cholesterol: 175 mg/dL (ref 0–200)
HDL: 62 mg/dL (ref >40)
LDL Cholesterol: 104 mg/dL — ABNORMAL HIGH (ref 0–99)
Total CHOL/HDL Ratio: 2.8 RATIO
Triglycerides: 47 mg/dL (ref <150)
VLDL: 9 mg/dL (ref 0–40)

## 2019-03-03 LAB — COMPREHENSIVE METABOLIC PANEL
ALT: 17 U/L (ref 0–44)
AST: 21 U/L (ref 15–41)
Albumin: 3.5 g/dL (ref 3.5–5.0)
Alkaline Phosphatase: 58 U/L (ref 38–126)
Anion gap: 9 (ref 5–15)
BUN: 19 mg/dL (ref 8–23)
CO2: 25 mmol/L (ref 22–32)
Calcium: 9.2 mg/dL (ref 8.9–10.3)
Chloride: 97 mmol/L — ABNORMAL LOW (ref 98–111)
Creatinine, Ser: 1.17 mg/dL (ref 0.61–1.24)
GFR calc Af Amer: >60 mL/min (ref >60)
GFR calc non Af Amer: 54 mL/min — ABNORMAL LOW (ref >60)
Glucose, Bld: 167 mg/dL — ABNORMAL HIGH (ref 70–99)
Potassium: 3.8 mmol/L (ref 3.5–5.1)
Sodium: 131 mmol/L — ABNORMAL LOW (ref 135–145)
Total Bilirubin: 1 mg/dL (ref 0.3–1.2)
Total Protein: 6.1 g/dL — ABNORMAL LOW (ref 6.5–8.1)

## 2019-03-03 LAB — DIFFERENTIAL
Abs Immature Granulocytes: 0.38 10*3/uL — ABNORMAL HIGH (ref 0.00–0.07)
Basophils Absolute: 0 10*3/uL (ref 0.0–0.1)
Basophils Relative: 0 %
Eosinophils Absolute: 0 10*3/uL (ref 0.0–0.5)
Eosinophils Relative: 0 %
Immature Granulocytes: 2 %
Lymphocytes Relative: 6 %
Lymphs Abs: 0.9 10*3/uL (ref 0.7–4.0)
Monocytes Absolute: 1.2 10*3/uL — ABNORMAL HIGH (ref 0.1–1.0)
Monocytes Relative: 7 %
Neutro Abs: 14.1 10*3/uL — ABNORMAL HIGH (ref 1.7–7.7)
Neutrophils Relative %: 85 %

## 2019-03-03 LAB — APTT: aPTT: 38 seconds — ABNORMAL HIGH (ref 24–36)

## 2019-03-03 LAB — CBC
HCT: 37.6 % — ABNORMAL LOW (ref 39.0–52.0)
HCT: 41.3 % (ref 39.0–52.0)
Hemoglobin: 12.9 g/dL — ABNORMAL LOW (ref 13.0–17.0)
Hemoglobin: 13.8 g/dL (ref 13.0–17.0)
MCH: 33.6 pg (ref 26.0–34.0)
MCH: 34.1 pg — ABNORMAL HIGH (ref 26.0–34.0)
MCHC: 34.3 g/dL (ref 30.0–36.0)
MCHC: 33.4 g/dL (ref 30.0–36.0)
MCV: 97.9 fL (ref 80.0–100.0)
MCV: 102 fL — ABNORMAL HIGH (ref 80.0–100.0)
Platelets: 537 10*3/uL — ABNORMAL HIGH (ref 150–400)
Platelets: 677 10*3/uL — ABNORMAL HIGH (ref 150–400)
RBC: 3.84 MIL/uL — ABNORMAL LOW (ref 4.22–5.81)
RBC: 4.05 MIL/uL — ABNORMAL LOW (ref 4.22–5.81)
RDW: 11.5 % (ref 11.5–15.5)
RDW: 11.8 % (ref 11.5–15.5)
WBC: 8.5 10*3/uL (ref 4.0–10.5)
WBC: 16.6 10*3/uL — ABNORMAL HIGH (ref 4.0–10.5)
nRBC: 0 % (ref 0.0–0.2)
nRBC: 0 % (ref 0.0–0.2)

## 2019-03-03 LAB — PROTIME-INR
INR: 1.1 (ref 0.8–1.2)
Prothrombin Time: 14.4 seconds (ref 11.4–15.2)

## 2019-03-03 LAB — CBG MONITORING, ED: Glucose-Capillary: 144 mg/dL — ABNORMAL HIGH (ref 70–99)

## 2019-03-03 LAB — HEMOGLOBIN A1C
Hgb A1c MFr Bld: 4.8 % (ref 4.8–5.6)
Mean Plasma Glucose: 91.06 mg/dL

## 2019-03-03 SURGERY — IR WITH ANESTHESIA
Anesthesia: General

## 2019-03-03 MED ORDER — CLOPIDOGREL BISULFATE 300 MG PO TABS
ORAL_TABLET | ORAL | Status: AC
  Filled 2019-03-03: qty 1

## 2019-03-03 MED ORDER — ALTEPLASE (STROKE) FULL DOSE INFUSION
0.9000 mg/kg | Freq: Once | INTRAVENOUS | Status: AC
  Administered 2019-03-03: 58.9 mg via INTRAVENOUS
  Filled 2019-03-03: qty 100

## 2019-03-03 MED ORDER — TIROFIBAN HCL IN NACL 5-0.9 MG/100ML-% IV SOLN
INTRAVENOUS | Status: AC
  Filled 2019-03-03: qty 100

## 2019-03-03 MED ORDER — PROPOFOL 10 MG/ML IV BOLUS
INTRAVENOUS | Status: DC | PRN
  Administered 2019-03-03: 100 mg via INTRAVENOUS

## 2019-03-03 MED ORDER — LACTATED RINGERS IV SOLN: INTRAVENOUS | Status: DC | PRN

## 2019-03-03 MED ORDER — PREDNISONE 10 MG PO TABS: ORAL_TABLET | ORAL | 0 refills | Status: DC

## 2019-03-03 MED ORDER — ALBUTEROL SULFATE HFA 108 (90 BASE) MCG/ACT IN AERS: 2.0000 | INHALATION_SPRAY | Freq: Four times a day (QID) | RESPIRATORY_TRACT | 0 refills | Status: DC | PRN

## 2019-03-03 MED ORDER — VANCOMYCIN HCL 1250 MG/250ML IV SOLN
1250.0000 mg | INTRAVENOUS | Status: AC
  Administered 2019-03-03: 1250 mg via INTRAVENOUS
  Filled 2019-03-03: qty 250

## 2019-03-03 MED ORDER — DOXYCYCLINE HYCLATE 100 MG PO TABS: 100.0000 mg | ORAL_TABLET | Freq: Two times a day (BID) | ORAL | 0 refills | Status: AC

## 2019-03-03 MED ORDER — PHENYLEPHRINE HCL-NACL 10-0.9 MG/250ML-% IV SOLN
INTRAVENOUS | Status: DC | PRN
  Administered 2019-03-03: 25 ug/min via INTRAVENOUS

## 2019-03-03 MED ORDER — SODIUM CHLORIDE 0.9% FLUSH
3.0000 mL | Freq: Two times a day (BID) | INTRAVENOUS | Status: DC
  Administered 2019-03-03: 3 mL via INTRAVENOUS

## 2019-03-03 MED ORDER — SODIUM CHLORIDE 0.9% FLUSH: 3.0000 mL | Freq: Once | INTRAVENOUS | Status: DC

## 2019-03-03 MED ORDER — SUCCINYLCHOLINE 20MG/ML (10ML) SYRINGE FOR MEDFUSION PUMP - OPTIME
INTRAMUSCULAR | Status: DC | PRN
  Administered 2019-03-03: 100 mg via INTRAVENOUS

## 2019-03-03 MED ORDER — EPTIFIBATIDE 20 MG/10ML IV SOLN
INTRAVENOUS | Status: AC
  Filled 2019-03-03: qty 10

## 2019-03-03 MED ORDER — ROCURONIUM 10MG/ML (10ML) SYRINGE FOR MEDFUSION PUMP - OPTIME
INTRAVENOUS | Status: DC | PRN
  Administered 2019-03-03: 50 mg via INTRAVENOUS

## 2019-03-03 MED ORDER — ASPIRIN 81 MG PO CHEW
CHEWABLE_TABLET | ORAL | Status: AC
  Filled 2019-03-03: qty 1

## 2019-03-03 MED ORDER — TICAGRELOR 90 MG PO TABS
ORAL_TABLET | ORAL | Status: AC
  Filled 2019-03-03: qty 2

## 2019-03-03 MED ORDER — FENTANYL CITRATE (PF) 100 MCG/2ML IJ SOLN
INTRAMUSCULAR | Status: AC
  Filled 2019-03-03: qty 2

## 2019-03-03 MED ORDER — LIDOCAINE HCL (CARDIAC) PF 100 MG/5ML IV SOSY
PREFILLED_SYRINGE | INTRAVENOUS | Status: DC | PRN
  Administered 2019-03-03: 60 mg via INTRATRACHEAL

## 2019-03-03 MED ORDER — QVAR REDIHALER 40 MCG/ACT IN AERB: 1.0000 | INHALATION_SPRAY | Freq: Two times a day (BID) | RESPIRATORY_TRACT | 0 refills | Status: DC

## 2019-03-03 MED ORDER — IOHEXOL 350 MG/ML SOLN
115.0000 mL | Freq: Once | INTRAVENOUS | Status: AC | PRN
  Administered 2019-03-03: 115 mL via INTRAVENOUS

## 2019-03-03 NOTE — H&P (Signed)
Admission H&P    Chief Complaint: Acute onset of right hemiplegia, right facial droop and aphasia  HPI: Jimmy Jimenez is an 86 y.o. male who was recently discharged from [facility] on Saturday afternoon after having been admitted there for treatment of acute on chronic respiratory failure with hypoxia. He presented to the ED at [facility] on Saturday night after being noted to be suddenly aphasic with right sided paralysis at home.   Per Rapid Response RN note: "Pt arrived in ED at 2223. At 2030 pt became aphasic with R sided paralysis. During transport to ED, pt's symptoms resolved completely. On arrival, at 2223, pt's symptoms were still resolved. At 2227, pt developed R facial droop and R sided weakness, Code stroke initiated at 2236. Pt transported to CT. While in CT, pt developed aphasia and dysarthria in addition to R facial droop and R leg drift. NIH-4, CBG-144".  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   LSN: [id number].  tPA Given: Yes   Past Medical History:  Diagnosis Date  . Colon polyps   . Essential thrombocytosis ([place])   . Essential thrombocytosis ([place])   . Hyperlipidemia   . Hypertension   . Lung nodule 12/2004   found on CXR  . Osteoporosis     Past Surgical History:  Procedure Laterality Date  . BONE MARROW BIOPSY  01/18/07  . COLONOSCOPY  2007, 2010   Dr Allen Norris  . NASAL SINUS SURGERY  06/11/05    Family History  Problem Relation Age of Onset  . Colon cancer Mother        colon age 45's  . Peripheral vascular disease Father   . Colon cancer Brother        colon age 34's  . Diabetes Other   . Prostate cancer Neg Hx   . Bladder Cancer Neg Hx   . Kidney cancer Neg Hx    Social History:  reports that he has never smoked. He has never used smokeless tobacco. He reports that he does not drink alcohol or use drugs.  Allergies:  Allergies  Allergen Reactions  . Amlodipine     swelling   . Atenolol     swelling  . Erythromycin     Other reaction(s): Hallucination  . Furosemide     Dizziness   . Hctz [Hydrochlorothiazide]     hyponatremia  . Hydrochlorothiazide W-Triamterene     Other reaction(s): Other (See Comments) Patient stated is causes low sodium Patient stated is causes low sodium Patient stated is causes low sodium  Patient stated is causes low sodium   Patient stated is causes low sodium Patient stated is causes low sodium  . Levofloxacin     Causes sleepiness  . Metoprolol     dizziness  . Micardis [Telmisartan]     Causes swelling  . Penicillins Itching    Has patient had a PCN reaction causing immediate rash, facial/tongue/throat swelling, SOB or lightheadedness with hypotension: No Has patient had a PCN reaction causing severe rash involving mucus membranes or skin necrosis: No Has patient had a PCN reaction that required hospitalization No Has patient had a PCN reaction occurring within the last 10 years: No If all of the above answers are "NO", then may proceed with Cephalosporin use.  . Terazosin     Causes swelling in legs and feet  . Triamterene-Hctz     Patient stated is causes low sodium   No current facility-administered  medications on file prior to encounter.   Current Outpatient Medications on File Prior to Encounter  Medication Sig Dispense Refill  . albuterol (VENTOLIN HFA) 108 (90 Base) MCG/ACT inhaler Inhale 2 puffs into the lungs every 6 (six) hours as needed for wheezing or shortness of breath. 18 g 0  . amLODipine (NORVASC) 5 MG tablet TAKE 1 TABLET BY MOUTH ONCE DAILY 90 tablet 1  . aspirin EC 81 MG tablet Take 81 mg by mouth daily. Reported on 08/14/2015    . azelastine (ASTELIN) 0.1 % nasal spray Place 2 sprays into both nostrils 2 (two) times daily.     . beclomethasone (QVAR REDIHALER) 40 MCG/ACT inhaler Inhale 1 puff into the lungs 2 (two) times daily. 10.6 g 0  . benzonatate (TESSALON) 200 MG capsule Take 1 capsule  (200 mg total) by mouth 2 (two) times daily as needed for cough. 20 capsule 0  . Calcium Citrate-Vitamin D (CITRACAL + D PO) Take 2 tablets by mouth daily. Take 2 by mouth daily, calcium 630 and vitamin D 500 each.     . Cholecalciferol (VITAMIN D3) 1000 UNITS CAPS Take 2,000 Units by mouth daily. Take 2 by mouth daily    . clotrimazole-betamethasone (LOTRISONE) cream Apply topically 2 (two) times daily. 90 g 5  . Cyanocobalamin (B-12 COMPLIANCE INJECTION) 1000 MCG/ML KIT Inject 1,000 mcg as directed every 30 (thirty) days.     . cyanocobalamin 1000 MCG tablet Take 5,000 mcg by mouth daily.    [name] docusate sodium (COLACE) 100 MG capsule Take 100 mg by mouth 2 (two) times daily as needed for mild constipation.    [name] esomeprazole (NEXIUM) 40 MG capsule TAKE 1 CAPSULE BY MOUTH ONCE DAILY AT NOON (Patient taking differently: Take 40 mg by mouth daily. ) 90 capsule 3  . furosemide (LASIX) 20 MG tablet TAKE 1 TABLET BY MOUTH EVERY OTHER DAY IN MORNING (Patient taking differently: Take 20 mg by mouth every other day. TAKE 1 TABLET BY MOUTH EVERY OTHER DAY IN MORNING) 45 tablet 2  . hydrALAZINE (APRESOLINE) 50 MG tablet TAKE 1 TABLET BY MOUTH 3 TIMES DAILY 90 tablet 1  . hydroxyurea (HYDREA) 500 MG capsule TAKE 1 CAPSULE BY MOUTH EVERY OTHER DAY MAY TAKE WITH FOOD TO MINIMIZE GI SIDE EFFECTS. 60 capsule 3  . Infant Care Products (DERMACLOUD) CREA Apply 1 application topically as needed (after each bowel movement for barrier for  pressure ulcer.). 430 g 2  . lisinopril (ZESTRIL) 40 MG tablet TAKE 1 TABLET BY MOUTH ONCE DAILY 90 tablet 1  . doxycycline (VIBRA-TABS) 100 MG tablet Take 1 tablet (100 mg total) by mouth every 12 (twelve) hours for 6 days. 12 tablet 0  . testosterone cypionate (DEPOTESTOSTERONE CYPIONATE) 200 MG/ML injection INJECT 0.25 ML INTRAMUSCULARLY EVERY 14 DAYS AS DIRECTED 10 mL 1     ROS: As per HPI. Unable to obtain additional ROS due to dysphasia.   Physical Examination: Weight 65.4  kg.  HEENT-  [place]/AT  Lungs - Respirations unlabored Extremities - Warm and well perfused. Shiny skin on anterior lower legs suggestive of prior resolved pretibial edema.   Neurologic Examination: Mental Status: Awake with decreased level of alertness. Fluctuating speech output and comprehension. At times with dysarthria and unintelligible responses, at other times with short but fluent replies. Could name two common objects but had difficulty with "thumb", perseverating a nonsensical word in response. Repetition impaired. Appears confused at times with impaired comprehension of some commands.   Cranial Nerves: II:  Temporal visual fields intact to confrontation with no extinction to DSS. PERRL.  III,IV, VI: No ptosis. EOM are full, but with some difficulty gazing to the left and saccadic quality of pursuits.  No nystagmus.  V,VII: Right facial droop fluctuates from mild to moderate during the exam. Temp sensation equal bilaterally  VIII: hearing intact to voice IX,X: Mild hypoponia XI: Head is midline  XII: midline tongue extension  Motor: RUE elevates antigravity with upward parietal drift noted.  RLE 4+/5. LUE and LLE 5/5 Sensory: Temp and light touch intact x 4.  Deep Tendon Reflexes:  2+ bilateral upper extremities and patellae.  Plantars: Equivocal bilaterally  Cerebellar: No ataxia with FNF bilaterally. Bilateral action tremor is noted.  Gait: Deferred   Results for orders placed or performed during the hospital encounter of 03/03/19 (from the past 48 hour(s))  CBC     Status: Abnormal   Collection Time: 03/03/19 10:37 PM  Result Value Ref Range   WBC 16.6 (H) 4.0 - 10.5 K/uL   RBC 4.05 (L) 4.22 - 5.81 MIL/uL   Hemoglobin 13.8 13.0 - 17.0 g/dL   HCT 41.3 39.0 - 52.0 %   MCV 102.0 (H) 80.0 - 100.0 fL   MCH 34.1 (H) 26.0 - 34.0 pg   MCHC 33.4 30.0 - 36.0 g/dL   RDW 11.8 11.5 - 15.5 %   Platelets 677 (H) 150 - 400 K/uL   nRBC 0.0 0.0 - 0.2 %    Comment: Performed at [facility], [place] [address], [place], [place] [address]  Differential     Status: Abnormal   Collection Time: 03/03/19 10:37 PM  Result Value Ref Range   Neutrophils Relative % 85 %   Neutro Abs 14.1 (H) 1.7 - 7.7 K/uL   Lymphocytes Relative 6 %   Lymphs Abs 0.9 0.7 - 4.0 K/uL   Monocytes Relative 7 %   Monocytes Absolute 1.2 (H) 0.1 - 1.0 K/uL   Eosinophils Relative 0 %   Eosinophils Absolute 0.0 0.0 - 0.5 K/uL   Basophils Relative 0 %   Basophils Absolute 0.0 0.0 - 0.1 K/uL   Immature Granulocytes 2 %   Abs Immature Granulocytes 0.38 (H) 0.00 - 0.07 K/uL    Comment: Performed at [place] [address], [place], [place] [address]  CBG monitoring, ED     Status: Abnormal   Collection Time: 03/03/19 10:37 PM  Result Value Ref Range   Glucose-Capillary 144 (H) 70 - 99 mg/dL   CT Angio Chest PE W and/or Wo Contrast  Result Date: 03/02/2019 CLINICAL DATA:  Shortness of breath. EXAM: CT ANGIOGRAPHY CHEST WITH CONTRAST TECHNIQUE: Multidetector CT imaging of the chest was performed using the standard protocol during bolus administration of intravenous contrast. Multiplanar CT image reconstructions and MIPs were obtained to evaluate the vascular anatomy. CONTRAST:  51m OMNIPAQUE IOHEXOL 350 MG/ML SOLN COMPARISON:  Radiograph yesterday. Chest CT 04/20/2016 had an outside institution. FINDINGS: Cardiovascular: There are no filling defects within the pulmonary arteries to suggest pulmonary embolus. Mild prominence of the left and right main pulmonary arteries measuring 2.7 and 2.8 cm respectively. Aortic atherosclerosis and tortuosity. No aortic aneurysm or dissection. Coronary artery calcifications. Normal heart size with slight right heart dilatation. Contrast refluxes into the hepatic veins and IVC. Mediastinum/Nodes: Calcified mediastinal and hilar lymph nodes. No noncalcified adenopathy. No esophageal wall thickening. No suspicious thyroid nodule. Lungs/Pleura: Bilateral subpleural  reticulation in a basilar predominant distribution, similar to prior exam. Slight  increased density within the dependent right greater than left lower lobe reticulation may represent pulmonary microlithiasis, this is also unchanged. No acute airspace disease. No pulmonary edema or pleural effusion. Trachea and mainstem bronchi are patent. No pulmonary mass. Upper Abdomen: Contrast refluxes into the hepatic veins and IVC. No other acute finding. Musculoskeletal: There are no acute or suspicious osseous abnormalities. Review of the MIP images confirms the above findings. IMPRESSION: 1. No pulmonary embolus. 2. Contrast reflux into the hepatic veins and IVC, suggesting elevated right heart pressures. 3. Unchanged chronic interstitial lung disease since 2018, likely pulmonary fibrosis/UIP. Aortic Atherosclerosis (ICD10-I70.0). Electronically Signed   By: Keith Rake M.D.   On: 03/02/2019 03:06   CT HEAD CODE STROKE WO CONTRAST`  Result Date: 03/03/2019 CLINICAL DATA:  Code stroke. Initial evaluation for acute stroke, facial droop. EXAM: CT HEAD WITHOUT CONTRAST TECHNIQUE: Contiguous axial images were obtained from the base of the skull through the vertex without intravenous contrast. COMPARISON:  None available. FINDINGS: Brain: Generalized age-related cerebral atrophy with moderate chronic microvascular ischemic disease. No acute intracranial hemorrhage. Subtle hypodensity seen involving the left caudate, concerning for possible evolving left MCA territory infarct. Gray-white matter differentiation otherwise grossly maintained without definite discernible evidence for evolving ischemia. No mass lesion or midline shift. No hydrocephalus. No extra-axial fluid collection. Vascular: Apparent hyperdensity noted involving the distal left M1 and/or proximal M2 branch (a series 6, image 45), concerning for possible LVO. Calcified atherosclerosis present at the skull base. Skull: Scalp soft tissues and calvarium within  normal limits. Sinuses/Orbits: Globes and orbital soft tissues within normal limits. Sequelae of prior sinus surgery noted. Chronic mucosal thickening noted within the maxillary sinuses, ethmoidal air cells, and right sphenoid sinus. Mastoid air cells are clear. Other: None. ASPECTS [facility] Stroke Program Early CT Score) - Ganglionic level infarction (caudate, lentiform nuclei, internal capsule, insula, M1-M3 cortex): 6 - Supraganglionic infarction (M4-M6 cortex): 3 Total score (0-10 with 10 being normal): 9 IMPRESSION: 1. Asymmetric hyperdensity involving the distal left M1 and/or proximal M2 branch, concerning for LVO. Subtle hypodensity involving the left caudate concerning for evolving left MCA territory ischemia. No intracranial hemorrhage. 2. ASPECTS is 9. 3. Underlying age-related cerebral atrophy with moderate chronic small vessel ischemic disease. These results were communicated to Dr. Cheral Marker At 10:58 pmon 1/2/2021by text page via the [facility] messaging system. Electronically Signed   By: Jeannine Boga M.D.   On: 03/03/2019 23:02    Assessment: 86 y.o. male presenting with acute onset of stuttering stroke symptoms referable to the left MCA territory 1. Exam reveals fluctuating dysarthria, dysphasia and right facial droop. Subtle weakness of RLE also noted.  2. CT head: Concern for focal hyperdensity in distal left M1/M2. There is subtle hypodensity in the left caudate with ASPECTS 9.  3. CTA head and neck: Positive CTA for LVO with occlusive thrombus within a proximal left M2 branch, superior division. Heavy calcified plaque throughout the carotid siphons with associated moderate to severe multifocal narrowing. Short-segment severe proximal right M2 and distal left P2 stenoses. Approximate 50% atheromatous stenoses at the origin of the right vertebral artery as well as involving the left V4 segment. 4. CTP: 11 cc acute core infarct involving the deep white matter of the left cerebral  hemisphere, watershed in distribution. Surrounding 55 cc ischemic penumbra involving much of the left MCA. 5. Stroke Risk Factors - HLD, HTN and essential thrombocytosis 6. On ASA at home. Classifiable as having failed ASA monotherapy.  Plan: 1. After comprehensive review of  possible contraindications, he has no absolute contraindications to tPA administration. 2. Patient is a tPA candidate. Two-physician consensus decision made to treat with IV tPA (Dr. Johnney Killian and Dr. Cheral Marker). Wife became available afterwards, prior to intubation, and expressed consent for continuing the IV tPA currently being administered after risks/benefits were discussed with her.  3. The patient is a candidate for mechanical thrombectomy following IV tPA. Two-physician consensus decision made to treat with thrombectomy (Dr. Johnney Killian and Dr. Cheral Marker). Consent form signed by Dr. Johnney Killian and Dr. Cheral Marker. Dr. Earleen Newport is in agreement that thrombectomy is indicated. Wife became available afterwards, prior to intubation, and expressed consent for the procedure after risks/benefits were discussed with her.  4. Following VIR, will admit to Neuro ICU.  5. Post-tPA order set to include frequent neuro checks and BP management.  6. No antiplatelet medications or anticoagulants for at least 24 hours following tPA.  7. DVT prophylaxis with SCDs.  8. TTE.  9. MRI brain 10. PT/OT/Speech.  11. NPO until passes swallow evaluation.  12. Telemetry monitoring 13. Fasting lipid panel, HgbA1c 14. Will need escalation of antiplatelet therapy from ASA to Plavix or to DAPT if follow up CT at 24 hours is negative for hemorrhagic conversion. 15. Prednisone was listed on his medications list in Epic. Per his wife he no longer takes this medication.  16. Amlodipine is listed as an allergy with swelling. However, he has this listed as a home medication in Epic and wife verifies that he currently takes this without any side effects. 17. Pulmonary  fibrosis and pulmonary HTN, recent admission for acute on chronic respiratory failure with hypoxia. Respiratory therapy consult. 18. Chronic diastolic CHF. Holding qod Lasix for now. Monitor closely. TTE pending.  19.  Essential thrombocytosis. Continue hydroxyurea.  60 minutes spent in the emergent neurological evaluation and management of this critically ill patient.   Electronically signed: Dr. Kerney Elbe 03/03/2019, 11:10 PM

## 2019-03-03 NOTE — ED Notes (Signed)
Dr. Cheral Marker speaking with pt's wife Delores

## 2019-03-03 NOTE — Progress Notes (Signed)
Pt states he desires to be discharge home and feels ready for discharge. Oral and written AVS discharge instructions given to them with stated understanding. Discharge pt home to self/family care. Pt will be transported in transport chair to private vehicle.

## 2019-03-03 NOTE — Progress Notes (Signed)
Pharmacist Code Stroke Response  Notified to mix tPA at 11:00  by Dr. Cheral Marker Delivered tPA to RN at  11:09   tPA dose = 5.9 mg bolus over 1 minute followed by 53 mg for a total dose of 58.9 mg over 1 hour  Issues/delays encountered (if applicable): Short delay in administration due to incorrect pump (pediatric)  Caryl Pina 03/03/19 11:15 PM

## 2019-03-03 NOTE — ED Notes (Signed)
Pts Son Mickie Felger, [phone] or [phone]

## 2019-03-03 NOTE — Progress Notes (Signed)
Reports he feeling much better without co's. Reports he noted increased SOB with minimal activity which resolves with rest. Weaned to room air. Wife at bedside. Solumedrol IV therapy continued.

## 2019-03-03 NOTE — Anesthesia Preprocedure Evaluation (Signed)
Anesthesia Evaluation  Patient identified by MRN, date of birth, ID bandGeneral Assessment Comment:Pt awake and alert but aphasic.Preop documentation limited or incomplete due to emergent nature of procedure.  Airway   TM Distance: <3 FB    Comment: Unable to cooperate with exam Dental   Pulmonary  Pulmonary fibrosis   breath sounds clear to auscultation       Cardiovascular hypertension,  Rhythm:regular Rate:Normal     Neuro/Psych CODE STROKE CVA    GI/Hepatic GERD  ,  Endo/Other    Renal/GU      Musculoskeletal   Abdominal   Peds  Hematology thrombocytosis   Anesthesia Other Findings   Reproductive/Obstetrics                             Anesthesia Physical Anesthesia Plan  ASA: III and emergent  Anesthesia Plan: General   Post-op Pain Management:    Induction: Intravenous  PONV Risk Score and Plan: 2 and Ondansetron, Dexamethasone and Treatment may vary due to age or medical condition  Airway Management Planned: Oral ETT  Additional Equipment: Arterial line  Intra-op Plan:   Post-operative Plan: Possible Post-op intubation/ventilation  Informed Consent: I have reviewed the patients History and Physical, chart, labs and discussed the procedure including the risks, benefits and alternatives for the proposed anesthesia with the patient or authorized representative who has indicated his/her understanding and acceptance.       Plan Discussed with: CRNA, Anesthesiologist and Surgeon  Anesthesia Plan Comments:         Anesthesia Quick Evaluation

## 2019-03-03 NOTE — Anesthesia Procedure Notes (Signed)
Arterial Line Insertion Start/End1/04/2019 11:43 PM, 03/03/2019 11:43 PM Performed by: Suzy Bouchard, CRNA  Patient location: OOR procedure area. Preanesthetic checklist: patient identified, IV checked, surgical consent, monitors and equipment checked, pre-op evaluation and timeout performed Left, ulnar was placed Catheter size: 20 G Hand hygiene performed  and maximum sterile barriers used   Attempts: 1 Procedure performed without using ultrasound guided technique. Following insertion, dressing applied and Biopatch. Post procedure assessment: normal  Patient tolerated the procedure well with no immediate complications.

## 2019-03-03 NOTE — ED Notes (Signed)
Wife, Council, [phone]

## 2019-03-03 NOTE — Anesthesia Procedure Notes (Signed)
Procedure Name: Intubation Date/Time: 03/03/2019 11:37 PM Performed by: Suzy Bouchard, CRNA Pre-anesthesia Checklist: Patient identified, Emergency Drugs available, Suction available, Patient being monitored and Timeout performed Patient Re-evaluated:Patient Re-evaluated prior to induction Oxygen Delivery Method: Circle system utilized Preoxygenation: Pre-oxygenation with 100% oxygen Induction Type: IV induction, Inhalational induction and Cricoid Pressure applied Laryngoscope Size: Mac and 3 Grade View: Grade III Tube type: Oral Tube size: 7.5 mm Number of attempts: 1 Airway Equipment and Method: Stylet Placement Confirmation: ETT inserted through vocal cords under direct vision,  positive ETCO2 and breath sounds checked- equal and bilateral Secured at: 23 cm Tube secured with: Tape Dental Injury: Teeth and Oropharynx as per pre-operative assessment

## 2019-03-03 NOTE — ED Provider Notes (Signed)
[place] EMERGENCY DEPARTMENT Provider Note   CSN: [id number] Arrival date & time: 03/03/19  2223  An emergency department physician performed an initial assessment on this suspected stroke patient at 2228.  History Chief Complaint  Patient presents with  . Code Stroke    Jimmy Jimenez is a 86 y.o. male.  HPI Patient was with his wife this evening and had sudden change it 8:30 PM.  He reportedly became aphasic and had right-sided paralysis.  EMS was called and found the patient to have signs of stroke with right-sided paralysis.  During transport it 22: 10 symptoms had completely resolved.  He was completely resolved upon arrival to the emergency department then within approximately 10 minutes had recurrence of right sided facial droop and delayed word finding.  EMR review indicates the patient had just been discharged today from [facility] hospital with diagnosis of acute on chronic respiratory failure with hypoxia.  Patient's discharge medications do not include any anticoagulants.  Patient is taking a 81 mg daily aspirin.    Past Medical History:  Diagnosis Date  . Colon polyps   . Essential thrombocytosis ([place])   . Essential thrombocytosis ([place])   . Hyperlipidemia   . Hypertension   . Lung nodule 12/2004   found on CXR  . Osteoporosis     Patient Active Problem List   Diagnosis Date Noted  . GERD (gastroesophageal reflux disease) 03/02/2019  . Acute respiratory failure with hypoxia ([place]) 03/02/2019  . Chronic diastolic CHF (congestive heart failure) ([place]) 03/02/2019  . Elevated troponin 03/02/2019  . Sacral decubitus ulcer, stage II ([place]) 02/19/2019  . Cough present for greater than 3 weeks 10/27/2018  . Chest pain 09/30/2018  . Rhinitis, chronic 11/20/2017  . Mass of pancreas 04/23/2016  . B12 deficiency 08/17/2015  . Testicular insufficiency 08/06/2015  . Low serum testosterone 05/20/2015  . Chronic constipation 03/31/2015  . Pulmonary  fibrosis ([place]) 12/12/2014  . Rectal bleeding 12/12/2014  . Abnormal weight loss 08/20/2014  . Xerosis of skin 12/18/2013  . Medicare annual wellness visit, subsequent 09/28/2013  . Statin intolerance 09/26/2013  . Pulmonary hypertension, mild ([place]) 12/14/2012  . Rhinitis, nonallergic 12/14/2012  . Solitary pulmonary nodule 10/16/2012  . Heart failure, diastolic, due to HTN ([place]) 10/16/2012  . Bradycardia, sinus 09/02/2012  . Hyponatremia 12/01/2011  . Edema of both legs 10/30/2011  . Essential thrombocytosis ([place])   . Hyperlipidemia   . Essential hypertension   . Osteoporosis     Past Surgical History:  Procedure Laterality Date  . BONE MARROW BIOPSY  01/18/07  . COLONOSCOPY  2007, 2010   Dr Allen Norris  . NASAL SINUS SURGERY  06/11/05       Family History  Problem Relation Age of Onset  . Colon cancer Mother        colon age 22's  . Peripheral vascular disease Father   . Colon cancer Brother        colon age 35's  . Diabetes Other   . Prostate cancer Neg Hx   . Bladder Cancer Neg Hx   . Kidney cancer Neg Hx     Social History   Tobacco Use  . Smoking status: Never Smoker  . Smokeless tobacco: Never Used  Substance Use Topics  . Alcohol use: No  . Drug use: No    Home Medications Prior to Admission medications   Medication Sig Start Date End Date Taking? Authorizing Provider  albuterol (VENTOLIN HFA) 108 (90 Base) MCG/ACT inhaler  Inhale 2 puffs into the lungs every 6 (six) hours as needed for wheezing or shortness of breath. 03/03/19   Loletha Grayer, MD  amLODipine (NORVASC) 5 MG tablet TAKE 1 TABLET BY MOUTH ONCE DAILY 12/26/18   Crecencio Mc, MD  aspirin EC 81 MG tablet Take 81 mg by mouth daily. Reported on 08/14/2015    [provider]  azelastine (ASTELIN) 0.1 % nasal spray Place 2 sprays into both nostrils 2 (two) times daily.  02/10/19   [provider]  beclomethasone (QVAR REDIHALER) 40 MCG/ACT inhaler Inhale 1 puff into the lungs 2  (two) times daily. 03/03/19   Loletha Grayer, MD  benzonatate (TESSALON) 200 MG capsule Take 1 capsule (200 mg total) by mouth 2 (two) times daily as needed for cough. 01/04/19   Crecencio Mc, MD  Calcium Citrate-Vitamin D (CITRACAL + D PO) Take 3 tablets by mouth daily. Take 2 by mouth daily, calcium 630 and vitamin D 500 each.    [provider]  Cholecalciferol (VITAMIN D3) 1000 UNITS CAPS Take 2,000 Units by mouth daily. Take 2 by mouth daily    [provider]  clotrimazole-betamethasone (LOTRISONE) cream Apply topically 2 (two) times daily. 11/28/18   Crecencio Mc, MD  Cyanocobalamin (B-12 COMPLIANCE INJECTION) 1000 MCG/ML KIT Inject as directed.    [provider]  cyanocobalamin 1000 MCG tablet Take 5,000 mcg by mouth daily.    [provider]  docusate sodium (COLACE) 100 MG capsule Take 100 mg by mouth 2 (two) times daily as needed for mild constipation.    [provider]  doxycycline (VIBRA-TABS) 100 MG tablet Take 1 tablet (100 mg total) by mouth every 12 (twelve) hours for 6 days. 03/03/19 03/09/19  Loletha Grayer, MD  esomeprazole (NEXIUM) 40 MG capsule TAKE 1 CAPSULE BY MOUTH ONCE DAILY AT NOON 11/27/18   Crecencio Mc, MD  furosemide (LASIX) 20 MG tablet TAKE 1 TABLET BY MOUTH EVERY OTHER DAY IN MORNING 10/31/18   Crecencio Mc, MD  hydrALAZINE (APRESOLINE) 50 MG tablet TAKE 1 TABLET BY MOUTH 3 TIMES DAILY 02/26/19   Crecencio Mc, MD  hydroxyurea (HYDREA) 500 MG capsule TAKE 1 CAPSULE BY MOUTH EVERY OTHER DAY MAY TAKE WITH FOOD TO MINIMIZE GI SIDE EFFECTS. 08/08/18   Cammie Sickle, MD  Infant [employer] Products ([place]) CREA Apply 1 application topically as needed (after each bowel movement for barrier for  pressure ulcer.). 02/15/19   Crecencio Mc, MD  lisinopril (ZESTRIL) 40 MG tablet TAKE 1 TABLET BY MOUTH ONCE DAILY 09/15/18   Crecencio Mc, MD  predniSONE (DELTASONE) 10 MG tablet 4 tabs po daily for three more days  03/04/19 03/06/19  Loletha Grayer, MD  testosterone cypionate (DEPOTESTOSTERONE CYPIONATE) 200 MG/ML injection INJECT 0.25 ML INTRAMUSCULARLY EVERY 14 DAYS AS DIRECTED 02/16/19   Crecencio Mc, MD    Allergies    Amlodipine, Atenolol, Erythromycin, Furosemide, Hctz [hydrochlorothiazide], Hydrochlorothiazide w-triamterene, Levofloxacin, Metoprolol, Micardis [telmisartan], Penicillins, Terazosin, and Triamterene-hctz  Review of Systems   Review of Systems Level 5 caveat cannot obtain review of systems due to patient condition. Physical Exam Updated Vital Signs BP 136/61   Pulse 80   Resp (!) 21   Wt 65.4 kg   SpO2 93%   BMI 21.92 kg/m   Physical Exam Constitutional:      Comments: Patient is awake and alert sitting up in the stretcher.  He does not have respiratory distress.  HENT:  Head: Normocephalic and atraumatic.     Mouth/Throat:     Mouth: Mucous membranes are moist.     Pharynx: Oropharynx is clear.  Eyes:     Extraocular Movements: Extraocular movements intact.  Cardiovascular:     Rate and Rhythm: Normal rate and regular rhythm.  Pulmonary:     Effort: Pulmonary effort is normal.     Breath sounds: Normal breath sounds.  Abdominal:     General: There is no distension.     Palpations: Abdomen is soft.     Tenderness: There is no abdominal tenderness.  Musculoskeletal:        General: Normal range of motion.     Cervical back: Neck supple.     Comments: Trace to 1+ edema at the ankles.  Skin:    General: Skin is warm and dry.  Neurological:     Comments: Upon my first exam patient does have pronounced right facial droop.  He is answering questions slowly but has some delayed word finding.  He is able to hold both hands extended without pronator drift.  He can elevate each lower extremity off of the bed independently and hold against gravity.  Psychiatric:        Mood and Affect: Mood normal.     ED Results / Procedures / Treatments   Labs (all labs  ordered are listed, but only abnormal results are displayed) Labs Reviewed  APTT - Abnormal; Notable for the following components:      Result Value   aPTT 38 (*)    All other components within normal limits  CBC - Abnormal; Notable for the following components:   WBC 16.6 (*)    RBC 4.05 (*)    MCV 102.0 (*)    MCH 34.1 (*)    Platelets 677 (*)    All other components within normal limits  DIFFERENTIAL - Abnormal; Notable for the following components:   Neutro Abs 14.1 (*)    Monocytes Absolute 1.2 (*)    Abs Immature Granulocytes 0.38 (*)    All other components within normal limits  COMPREHENSIVE METABOLIC PANEL - Abnormal; Notable for the following components:   Sodium 131 (*)    Chloride 97 (*)    Glucose, Bld 167 (*)    Total Protein 6.1 (*)    GFR calc non Af Amer 54 (*)    All other components within normal limits  CBG MONITORING, ED - Abnormal; Notable for the following components:   Glucose-Capillary 144 (*)    All other components within normal limits  RESPIRATORY PANEL BY RT PCR (FLU A&B, COVID)  PROTIME-INR  I-STAT CHEM 8, ED    EKG None  Radiology CT Angio Chest PE W and/or Wo Contrast  Result Date: 03/02/2019 CLINICAL DATA:  Shortness of breath. EXAM: CT ANGIOGRAPHY CHEST WITH CONTRAST TECHNIQUE: Multidetector CT imaging of the chest was performed using the standard protocol during bolus administration of intravenous contrast. Multiplanar CT image reconstructions and MIPs were obtained to evaluate the vascular anatomy. CONTRAST:  76m OMNIPAQUE IOHEXOL 350 MG/ML SOLN COMPARISON:  Radiograph yesterday. Chest CT 04/20/2016 had an outside institution. FINDINGS: Cardiovascular: There are no filling defects within the pulmonary arteries to suggest pulmonary embolus. Mild prominence of the left and right main pulmonary arteries measuring 2.7 and 2.8 cm respectively. Aortic atherosclerosis and tortuosity. No aortic aneurysm or dissection. Coronary artery calcifications.  Normal heart size with slight right heart dilatation. Contrast refluxes into the hepatic veins and IVC. Mediastinum/Nodes: Calcified  mediastinal and hilar lymph nodes. No noncalcified adenopathy. No esophageal wall thickening. No suspicious thyroid nodule. Lungs/Pleura: Bilateral subpleural reticulation in a basilar predominant distribution, similar to prior exam. Slight increased density within the dependent right greater than left lower lobe reticulation may represent pulmonary microlithiasis, this is also unchanged. No acute airspace disease. No pulmonary edema or pleural effusion. Trachea and mainstem bronchi are patent. No pulmonary mass. Upper Abdomen: Contrast refluxes into the hepatic veins and IVC. No other acute finding. Musculoskeletal: There are no acute or suspicious osseous abnormalities. Review of the MIP images confirms the above findings. IMPRESSION: 1. No pulmonary embolus. 2. Contrast reflux into the hepatic veins and IVC, suggesting elevated right heart pressures. 3. Unchanged chronic interstitial lung disease since 2018, likely pulmonary fibrosis/UIP. Aortic Atherosclerosis (ICD10-I70.0). Electronically Signed   By: Keith Rake M.D.   On: 03/02/2019 03:06   CT HEAD CODE STROKE WO CONTRAST`  Result Date: 03/03/2019 CLINICAL DATA:  Code stroke. Initial evaluation for acute stroke, facial droop. EXAM: CT HEAD WITHOUT CONTRAST TECHNIQUE: Contiguous axial images were obtained from the base of the skull through the vertex without intravenous contrast. COMPARISON:  None available. FINDINGS: Brain: Generalized age-related cerebral atrophy with moderate chronic microvascular ischemic disease. No acute intracranial hemorrhage. Subtle hypodensity seen involving the left caudate, concerning for possible evolving left MCA territory infarct. Gray-white matter differentiation otherwise grossly maintained without definite discernible evidence for evolving ischemia. No mass lesion or midline shift. No  hydrocephalus. No extra-axial fluid collection. Vascular: Apparent hyperdensity noted involving the distal left M1 and/or proximal M2 branch (a series 6, image 45), concerning for possible LVO. Calcified atherosclerosis present at the skull base. Skull: Scalp soft tissues and calvarium within normal limits. Sinuses/Orbits: Globes and orbital soft tissues within normal limits. Sequelae of prior sinus surgery noted. Chronic mucosal thickening noted within the maxillary sinuses, ethmoidal air cells, and right sphenoid sinus. Mastoid air cells are clear. Other: None. ASPECTS [facility] Stroke Program Early CT Score) - Ganglionic level infarction (caudate, lentiform nuclei, internal capsule, insula, M1-M3 cortex): 6 - Supraganglionic infarction (M4-M6 cortex): 3 Total score (0-10 with 10 being normal): 9 IMPRESSION: 1. Asymmetric hyperdensity involving the distal left M1 and/or proximal M2 branch, concerning for LVO. Subtle hypodensity involving the left caudate concerning for evolving left MCA territory ischemia. No intracranial hemorrhage. 2. ASPECTS is 9. 3. Underlying age-related cerebral atrophy with moderate chronic small vessel ischemic disease. These results were communicated to Dr. Cheral Marker At 10:58 pmon 1/2/2021by text page via the [facility] messaging system. Electronically Signed   By: Jeannine Boga M.D.   On: 03/03/2019 23:02    Procedures Procedures (including critical care time) CRITICAL CARE Performed by: Charlesetta Shanks   Total critical care time: 30 minutes  Critical care time was exclusive of separately billable procedures and treating other patients.  Critical care was necessary to treat or prevent imminent or life-threatening deterioration.  Critical care was time spent personally by me on the following activities: development of treatment plan with patient and/or surrogate as well as nursing, discussions with consultants, evaluation of patient's response to treatment, examination of  patient, obtaining history from patient or surrogate, ordering and performing treatments and interventions, ordering and review of laboratory studies, ordering and review of radiographic studies, pulse oximetry and re-evaluation of patient's condition. Medications Ordered in ED Medications  sodium chloride flush (NS) 0.9 % injection 3 mL (has no administration in time range)  alteplase (ACTIVASE) 1 mg/mL infusion 58.9 mg (58.9 mg Intravenous New Bag/Given 03/03/19 2314)  iohexol (OMNIPAQUE) 350 MG/ML injection 115 mL (115 mLs Intravenous Contrast Given 03/03/19 2311)    ED Course  I have reviewed the triage vital signs and the nursing notes.  Pertinent labs & imaging results that were available during my care of the patient were reviewed by me and considered in my medical decision making (see chart for details).    MDM Rules/Calculators/A&P                     Consult: Case has been reviewed with Dr. Cheral Marker.  Patient presented with report of a fairly dense aphasia and right-sided paralysis that had complete resolution during EMS transport.  This then recurred with right sided facial droop and delayed word finding.  Findings were consistent with significant CVA.  Dr. Cheral Marker had attempted to contact family members by phone to discuss TPA administration.  At that time he was unable to make contact for shared decision-making.  We have both assessed the patient and reviewed his history.  In shared decision-making, we had determined that best course for the patient is administration of TPA for significant stroke with risk of severe long-term sequelae and dysfunction.  Patient will be admitted to neurology service for ongoing management. Final Clinical Impression(s) / ED Diagnoses Final diagnoses:  Acute ischemic stroke [facility])    Rx / Liborio Negron Torres Orders ED Discharge Orders    None       Charlesetta Shanks, MD 03/04/19 0000

## 2019-03-03 NOTE — Discharge Summary (Signed)
[facility] NAME: Jimmy Jimenez    MR#:  [id number]  DATE OF BIRTH:  84/01/1926  DATE OF ADMISSION:  03/02/2019 ADMITTING PHYSICIAN: Ivor Costa, MD  DATE OF DISCHARGE: 03/03/2019  PRIMARY CARE PHYSICIAN: Crecencio Mc, MD    ADMISSION DIAGNOSIS:  Acute respiratory failure with hypoxia (HCC) [J96.01] Acute on chronic respiratory failure with hypoxia (HCC) [J96.21]  DISCHARGE DIAGNOSIS:  Principal Problem:   Acute on chronic respiratory failure with hypoxia (HCC) Active Problems:   Essential thrombocytosis (HCC)   Pulmonary hypertension, mild (HCC)   Pulmonary fibrosis (HCC)   GERD (gastroesophageal reflux disease)   Chronic diastolic CHF (congestive heart failure) (HCC)   Elevated troponin   SECONDARY DIAGNOSIS:   Past Medical History:  Diagnosis Date  . Colon polyps   . Essential thrombocytosis ([place])   . Essential thrombocytosis ([place])   . Hyperlipidemia   . Hypertension   . Lung nodule 12/2004   found on CXR  . Osteoporosis     HOSPITAL COURSE:   1.  Acute hypoxic respiratory failure.  This has resolved and patient is satting 95% on room air. 2.  Pulmonary fibrosis exacerbation.  Patient was given Solu-Medrol IV and I will switch over to prednisone for 3 more days of treatment.  Albuterol and Qvar inhaler prescribed.  Close clinical follow-up with PMD.  Admitting physician started doxycycline and I will finish up a course.  Patient ambulated in the room this afternoon and feeling better with regards to his breathing and wanted to go home.  Patient improved quicker than anticipated on admission. 3.  Essential hypertension on amlodipine hydralazine and lisinopril 4.  GERD on PPI 5.  Chronic diastolic congestive heart failure.  No signs of heart failure currently on Lasix every other day 6.  Elevated troponin demand ischemia from acute hypoxic respiratory failure 7.  Essential thrombocytosis.  Platelet count 84  On  hydroxyurea  DISCHARGE CONDITIONS:   Satisfactory  CONSULTS OBTAINED:  None  DRUG ALLERGIES:   Allergies  Allergen Reactions  . Amlodipine     swelling  . Atenolol     swelling  . Erythromycin     Other reaction(s): Hallucination  . Furosemide     Dizziness   . Hctz [Hydrochlorothiazide]     hyponatremia  . Hydrochlorothiazide W-Triamterene     Other reaction(s): Other (See Comments) Patient stated is causes low sodium Patient stated is causes low sodium Patient stated is causes low sodium  Patient stated is causes low sodium   Patient stated is causes low sodium Patient stated is causes low sodium  . Levofloxacin     Causes sleepiness  . Metoprolol     dizziness  . Micardis [Telmisartan]     Causes swelling  . Penicillins Itching    Has patient had a PCN reaction causing immediate rash, facial/tongue/throat swelling, SOB or lightheadedness with hypotension: No Has patient had a PCN reaction causing severe rash involving mucus membranes or skin necrosis: No Has patient had a PCN reaction that required hospitalization No Has patient had a PCN reaction occurring within the last 10 years: No If all of the above answers are "NO", then may proceed with Cephalosporin use.  . Terazosin     Causes swelling in legs and feet  . Triamterene-Hctz     Patient stated is causes low sodium    DISCHARGE MEDICATIONS:   Allergies as of 03/03/2019      Reactions   Amlodipine  swelling   Atenolol    swelling   Erythromycin    Other reaction(s): Hallucination   Furosemide    Dizziness   Hctz [hydrochlorothiazide]    hyponatremia   Hydrochlorothiazide W-triamterene    Other reaction(s): Other (See Comments) Patient stated is causes low sodium Patient stated is causes low sodium Patient stated is causes low sodium Patient stated is causes low sodium Patient stated is causes low sodium Patient stated is causes low sodium   Levofloxacin    Causes sleepiness    Metoprolol    dizziness   Micardis [telmisartan]    Causes swelling   Penicillins Itching   Has patient had a PCN reaction causing immediate rash, facial/tongue/throat swelling, SOB or lightheadedness with hypotension: No Has patient had a PCN reaction causing severe rash involving mucus membranes or skin necrosis: No Has patient had a PCN reaction that required hospitalization No Has patient had a PCN reaction occurring within the last 10 years: No If all of the above answers are "NO", then may proceed with Cephalosporin use.   Terazosin    Causes swelling in legs and feet   Triamterene-hctz    Patient stated is causes low sodium      Medication List    TAKE these medications   albuterol 108 (90 Base) MCG/ACT inhaler Commonly known as: VENTOLIN HFA Inhale 2 puffs into the lungs every 6 (six) hours as needed for wheezing or shortness of breath.   amLODipine 5 MG tablet Commonly known as: NORVASC TAKE 1 TABLET BY MOUTH ONCE DAILY Notes to patient: Sunday morning   aspirin EC 81 MG tablet Take 81 mg by mouth daily. Reported on 08/14/2015 Notes to patient: Sunday morning   azelastine 0.1 % nasal spray Commonly known as: ASTELIN Place 2 sprays into both nostrils 2 (two) times daily. Notes to patient: tonight   B-12 Compliance Injection 1000 MCG/ML Kit Generic drug: Cyanocobalamin Inject as directed. Notes to patient: Next scheduled dose   cyanocobalamin 1000 MCG tablet Take 5,000 mcg by mouth daily. Notes to patient: Sunday morning   benzonatate 200 MG capsule Commonly known as: TESSALON Take 1 capsule (200 mg total) by mouth 2 (two) times daily as needed for cough.   CITRACAL + D PO Take 3 tablets by mouth daily. Take 2 by mouth daily, calcium 630 and vitamin D 500 each. Notes to patient: Sunday morning   clotrimazole-betamethasone cream Commonly known as: LOTRISONE Apply topically 2 (two) times daily. Notes to patient: tonight   Dermacloud Crea Apply 1  application topically as needed (after each bowel movement for barrier for  pressure ulcer.).   docusate sodium 100 MG capsule Commonly known as: COLACE Take 100 mg by mouth 2 (two) times daily as needed for mild constipation.   doxycycline 100 MG tablet Commonly known as: VIBRA-TABS Take 1 tablet (100 mg total) by mouth every 12 (twelve) hours for 6 days. Notes to patient: tonight   esomeprazole 40 MG capsule Commonly known as: NEXIUM TAKE 1 CAPSULE BY MOUTH ONCE DAILY AT NOON Notes to patient: Sunday at noon   furosemide 20 MG tablet Commonly known as: LASIX TAKE 1 TABLET BY MOUTH EVERY OTHER DAY IN MORNING Notes to patient: Monday morning   hydrALAZINE 50 MG tablet Commonly known as: APRESOLINE TAKE 1 TABLET BY MOUTH 3 TIMES DAILY Notes to patient: This afternoon   hydroxyurea 500 MG capsule Commonly known as: HYDREA TAKE 1 CAPSULE BY MOUTH EVERY OTHER DAY MAY TAKE WITH FOOD TO MINIMIZE GI  SIDE EFFECTS. Notes to patient: Sunday morning   lisinopril 40 MG tablet Commonly known as: ZESTRIL TAKE 1 TABLET BY MOUTH ONCE DAILY Notes to patient: Sunday morning   predniSONE 10 MG tablet Commonly known as: DELTASONE 4 tabs po daily for three more days Start taking on: March 04, 2019   Qvar RediHaler 40 MCG/ACT inhaler Generic drug: beclomethasone Inhale 1 puff into the lungs 2 (two) times daily.   testosterone cypionate 200 MG/ML injection Commonly known as: DEPOTESTOSTERONE CYPIONATE INJECT 0.25 ML INTRAMUSCULARLY EVERY 14 DAYS AS DIRECTED Notes to patient: Per schedule   Vitamin D3 25 MCG (1000 UT) Caps Take 2,000 Units by mouth daily. Take 2 by mouth daily Notes to patient: Sunday morning        DISCHARGE INSTRUCTIONS:   Follow-up PMD 5 days  If you experience worsening of your admission symptoms, develop shortness of breath, life threatening emergency, suicidal or homicidal thoughts you must seek medical attention immediately by calling 911 or calling your  MD immediately  if symptoms less severe.  You Must read complete instructions/literature along with all the possible adverse reactions/side effects for all the Medicines you take and that have been prescribed to you. Take any new Medicines after you have completely understood and accept all the possible adverse reactions/side effects.   Please note  You were cared for by a hospitalist during your hospital stay. If you have any questions about your discharge medications or the care you received while you were in the hospital after you are discharged, you can call the unit and asked to speak with the hospitalist on call if the hospitalist that took care of you is not available. Once you are discharged, your primary care physician will handle any further medical issues. Please note that NO REFILLS for any discharge medications will be authorized once you are discharged, as it is imperative that you return to your primary care physician (or establish a relationship with a primary care physician if you do not have one) for your aftercare needs so that they can reassess your need for medications and monitor your lab values.    Today   CHIEF COMPLAINT:   Chief Complaint  Patient presents with  . Shortness of Breath    HISTORY OF PRESENT ILLNESS:  Jaylin Benzel  is a 86 y.o. male presented with shortness of   VITAL SIGNS:  Blood pressure 140/60, pulse 77, temperature 98.1 F (36.7 C), temperature source Oral, resp. rate 18, height 5' 8"  (1.727 m), weight 67.1 kg, SpO2 95 %.   PHYSICAL EXAMINATION:  GENERAL:  86 y.o.-year-old patient lying in the bed with no acute distress.  EYES: Pupils equal, round, reactive to light and accommodation. No scleral icterus. Extraocular muscles intact.  HEENT: Head atraumatic, normocephalic. Oropharynx and nasopharynx clear.  NECK:  Supple, no jugular venous distention. No thyroid enlargement, no tenderness.  LUNGS: Decreased breath sounds bilaterally, no  wheezing, rales,rhonchi or crepitation. No use of accessory muscles of respiration.  CARDIOVASCULAR: S1, S2 normal. No murmurs, rubs, or gallops.  ABDOMEN: Soft, non-tender, non-distended. Bowel sounds present. No organomegaly or mass.  EXTREMITIES: No pedal edema, cyanosis, or clubbing.  NEUROLOGIC: Cranial nerves II through XII are intact. Muscle strength 5/5 in all extremities. Sensation intact. Gait not checked.  PSYCHIATRIC: The patient is alert and oriented x 3.  SKIN: No obvious rash, lesion, or ulcer.   DATA REVIEW:   CBC Recent Labs  Lab 03/03/19 0339  WBC 8.5  HGB 12.9*  HCT  37.6*  PLT 537*    Chemistries  Recent Labs  Lab 03/03/19 0339  NA 132*  K 3.8  CL 96*  CO2 30  GLUCOSE 139*  BUN 14  CREATININE 0.78  CALCIUM 8.6*     Microbiology Results  Results for orders placed or performed during the hospital encounter of 03/02/19  Respiratory Panel by RT PCR (Flu A&B, Covid) - Urine, Clean Catch     Status: None   Collection Time: 03/02/19  4:49 AM   Specimen: Urine, Clean Catch  Result Value Ref Range Status   SARS Coronavirus 2 by RT PCR NEGATIVE NEGATIVE Final    Comment: (NOTE) SARS-CoV-2 target nucleic acids are NOT DETECTED. The SARS-CoV-2 RNA is generally detectable in upper respiratoy specimens during the acute phase of infection. The lowest concentration of SARS-CoV-2 viral copies this assay can detect is 131 copies/mL. A negative result does not preclude SARS-Cov-2 infection and should not be used as the sole basis for treatment or other patient management decisions. A negative result may occur with  improper specimen collection/handling, submission of specimen other than nasopharyngeal swab, presence of viral mutation(s) within the areas targeted by this assay, and inadequate number of viral copies (<131 copies/mL). A negative result must be combined with clinical observations, patient history, and epidemiological information. The expected result  is Negative. Fact Sheet for Patients:  PinkCheek.be Fact Sheet for Healthcare Providers:  GravelBags.it This test is not yet ap proved or cleared by the [place] FDA and  has been authorized for detection and/or diagnosis of SARS-CoV-2 by FDA under an Emergency Use Authorization (EUA). This EUA will remain  in effect (meaning this test can be used) for the duration of the COVID-19 declaration under Section 564(b)(1) of the Act, 21 U.S.C. section 360bbb-3(b)(1), unless the authorization is terminated or revoked sooner.    Influenza A by PCR NEGATIVE NEGATIVE Final   Influenza B by PCR NEGATIVE NEGATIVE Final    Comment: (NOTE) The Xpert Xpress SARS-CoV-2/FLU/RSV assay is intended as an aid in  the diagnosis of influenza from Nasopharyngeal swab specimens and  should not be used as a sole basis for treatment. Nasal washings and  aspirates are unacceptable for Xpert Xpress SARS-CoV-2/FLU/RSV  testing. Fact Sheet for Patients: PinkCheek.be Fact Sheet for Healthcare Providers: GravelBags.it This test is not yet approved or cleared by the [place] FDA and  has been authorized for detection and/or diagnosis of SARS-CoV-2 by  FDA under an Emergency Use Authorization (EUA). This EUA will remain  in effect (meaning this test can be used) for the duration of the  Covid-19 declaration under Section 564(b)(1) of the Act, 21  U.S.C. section 360bbb-3(b)(1), unless the authorization is  terminated or revoked. Performed at [facility], [address] [place], [place] [address]   CULTURE, BLOOD (ROUTINE X 2) w Reflex to ID Panel     Status: None (Preliminary result)   Collection Time: 03/02/19  1:58 PM   Specimen: BLOOD  Result Value Ref Range Status   Specimen Description BLOOD BLOOD LEFT HAND  Final   Special Requests   Final    BOTTLES DRAWN AEROBIC AND  ANAEROBIC Blood Culture adequate volume   Culture   Final    NO GROWTH < 24 HOURS Performed at [facility], [address] [place], [place] [address]    Report Status PENDING  Incomplete  CULTURE, BLOOD (ROUTINE X 2) w Reflex to ID Panel     Status: None (Preliminary result)  Collection Time: 03/02/19  2:05 PM   Specimen: BLOOD  Result Value Ref Range Status   Specimen Description BLOOD RIGHT ANTECUBITAL  Final   Special Requests   Final    BOTTLES DRAWN AEROBIC AND ANAEROBIC Blood Culture adequate volume   Culture   Final    NO GROWTH < 24 HOURS Performed at [facility], [address] [place], [place] [address]    Report Status PENDING  Incomplete    RADIOLOGY:  DG Chest 2 View  Result Date: 03/01/2019 CLINICAL DATA:  Shortness of breath, cough EXAM: CHEST - 2 VIEW COMPARISON:  09/28/2018 FINDINGS: Stable cardiomediastinal contours. Calcified thoracic aorta. Chronic interstitial lung changes without definite new superimposed airspace opacity. No pleural effusion or pneumothorax. IMPRESSION: Chronic interstitial lung changes without definite superimposed airspace opacity Electronically Signed   By: Davina Poke D.O.   On: 03/01/2019 16:48   CT Angio Chest PE W and/or Wo Contrast  Result Date: 03/02/2019 CLINICAL DATA:  Shortness of breath. EXAM: CT ANGIOGRAPHY CHEST WITH CONTRAST TECHNIQUE: Multidetector CT imaging of the chest was performed using the standard protocol during bolus administration of intravenous contrast. Multiplanar CT image reconstructions and MIPs were obtained to evaluate the vascular anatomy. CONTRAST:  5m OMNIPAQUE IOHEXOL 350 MG/ML SOLN COMPARISON:  Radiograph yesterday. Chest CT 04/20/2016 had an outside institution. FINDINGS: Cardiovascular: There are no filling defects within the pulmonary arteries to suggest pulmonary embolus. Mild prominence of the left and right main pulmonary arteries measuring 2.7 and 2.8 cm respectively.  Aortic atherosclerosis and tortuosity. No aortic aneurysm or dissection. Coronary artery calcifications. Normal heart size with slight right heart dilatation. Contrast refluxes into the hepatic veins and IVC. Mediastinum/Nodes: Calcified mediastinal and hilar lymph nodes. No noncalcified adenopathy. No esophageal wall thickening. No suspicious thyroid nodule. Lungs/Pleura: Bilateral subpleural reticulation in a basilar predominant distribution, similar to prior exam. Slight increased density within the dependent right greater than left lower lobe reticulation may represent pulmonary microlithiasis, this is also unchanged. No acute airspace disease. No pulmonary edema or pleural effusion. Trachea and mainstem bronchi are patent. No pulmonary mass. Upper Abdomen: Contrast refluxes into the hepatic veins and IVC. No other acute finding. Musculoskeletal: There are no acute or suspicious osseous abnormalities. Review of the MIP images confirms the above findings. IMPRESSION: 1. No pulmonary embolus. 2. Contrast reflux into the hepatic veins and IVC, suggesting elevated right heart pressures. 3. Unchanged chronic interstitial lung disease since 2018, likely pulmonary fibrosis/UIP. Aortic Atherosclerosis (ICD10-I70.0). Electronically Signed   By: MKeith RakeM.D.   On: 03/02/2019 03:06    Management plans discussed with the patient, family and they are in agreement.  CODE STATUS:     Code Status Orders  (From admission, onward)         Start     Ordered   03/02/19 1013  Full code  Continuous     03/02/19 1012        Code Status History    Date Active Date Inactive Code Status Order ID Comments User Context   12/07/2014 1034 12/08/2014 1430 Full Code [id number] SHenreitta Leber MD Inpatient   Advance Care Planning Activity      TOTAL TIME TAKING CARE OF THIS PATIENT: 38 minutes.    RLoletha GrayerM.D on 03/03/2019 at 2:05 PM  Between 7am to 6pm - Pager - [phone] After 6pm go to  [email or web address] - password [email or web address]  [facility]  CC: Primary care physician; TCrecencio Mc MD

## 2019-03-03 NOTE — Progress Notes (Signed)
NeuroInterventional Radiology  Pre-Procedure Note  History: 86 yo male with sudden onset right sided symptoms and aphasia 8:30pm tonight, with EMS transport to the ED.  Symptoms completely resolved during transport, with then a witnessed decline about 10 minutes after arriving at the ED, with recurrent right side symptoms and aphasia.    CT imaging work up shows left M1 occlusion.    Baseline mRS: 0 NIHSS:   >6  CT ASPECTS: 9 CTA:   Left M1/M2 occlusion  Given the patient's symptoms, imaging findings, baseline function, I agree with Dr. Cheral Marker that he is appropriate candidate for mechanical thrombectomy.    The risks and benefits of the procedure were discussed with the patient/patient's family, with specific risks including: bleeding, infection, arterial injury/dissection, contrast reaction, kidney injury, need for further procedure/surgery, neurologic deficit, 10-15% risk of intracranial hemorrhage, cardiopulmonary collapse, death. All questions were answered.  The patient/family would like to proceed with attempt at thrombectomy.   Plan for cerebral angiogram and attempt at mechanical thrombectomy.   Signed,   Dulcy Fanny. Earleen Newport, DO

## 2019-03-03 NOTE — Progress Notes (Signed)
Patient ID: Jimmy Jimenez, male   DOB: 03/13/1925, 86 y.o.   MRN: [id number]  Notified by Cover my meds that the qvar is not covered.  Unable to escribe new script to patients pharmacy because thay left over two hours ago.  I called [facility] and left a message on the prescription line for Flovent hfa inhaler instead.  Dr Loletha Grayer

## 2019-03-04 ENCOUNTER — Other Ambulatory Visit (HOSPITAL_COMMUNITY): Payer: PPO

## 2019-03-04 ENCOUNTER — Inpatient Hospital Stay (HOSPITAL_COMMUNITY): Payer: No Typology Code available for payment source

## 2019-03-04 DIAGNOSIS — I63412 Cerebral infarction due to embolism of left middle cerebral artery: Secondary | ICD-10-CM | POA: Diagnosis present

## 2019-03-04 DIAGNOSIS — Z8719 Personal history of other diseases of the digestive system: Secondary | ICD-10-CM | POA: Diagnosis not present

## 2019-03-04 DIAGNOSIS — Z79899 Other long term (current) drug therapy: Secondary | ICD-10-CM | POA: Diagnosis not present

## 2019-03-04 DIAGNOSIS — D473 Essential (hemorrhagic) thrombocythemia: Secondary | ICD-10-CM | POA: Diagnosis not present

## 2019-03-04 DIAGNOSIS — Z20822 Contact with and (suspected) exposure to covid-19: Secondary | ICD-10-CM | POA: Diagnosis present

## 2019-03-04 DIAGNOSIS — R4701 Aphasia: Secondary | ICD-10-CM | POA: Diagnosis present

## 2019-03-04 DIAGNOSIS — I639 Cerebral infarction, unspecified: Secondary | ICD-10-CM | POA: Diagnosis present

## 2019-03-04 DIAGNOSIS — L89152 Pressure ulcer of sacral region, stage 2: Secondary | ICD-10-CM | POA: Diagnosis present

## 2019-03-04 DIAGNOSIS — Z833 Family history of diabetes mellitus: Secondary | ICD-10-CM | POA: Diagnosis not present

## 2019-03-04 DIAGNOSIS — R4702 Dysphasia: Secondary | ICD-10-CM | POA: Diagnosis present

## 2019-03-04 DIAGNOSIS — Z7982 Long term (current) use of aspirin: Secondary | ICD-10-CM | POA: Diagnosis not present

## 2019-03-04 DIAGNOSIS — R29706 NIHSS score 6: Secondary | ICD-10-CM | POA: Diagnosis present

## 2019-03-04 DIAGNOSIS — I361 Nonrheumatic tricuspid (valve) insufficiency: Secondary | ICD-10-CM | POA: Diagnosis not present

## 2019-03-04 DIAGNOSIS — I6300 Cerebral infarction due to thrombosis of unspecified precerebral artery: Secondary | ICD-10-CM | POA: Diagnosis not present

## 2019-03-04 DIAGNOSIS — I351 Nonrheumatic aortic (valve) insufficiency: Secondary | ICD-10-CM | POA: Diagnosis not present

## 2019-03-04 DIAGNOSIS — D72829 Elevated white blood cell count, unspecified: Secondary | ICD-10-CM | POA: Diagnosis not present

## 2019-03-04 DIAGNOSIS — E785 Hyperlipidemia, unspecified: Secondary | ICD-10-CM | POA: Diagnosis present

## 2019-03-04 DIAGNOSIS — I110 Hypertensive heart disease with heart failure: Secondary | ICD-10-CM | POA: Diagnosis present

## 2019-03-04 DIAGNOSIS — I721 Aneurysm of artery of upper extremity: Secondary | ICD-10-CM | POA: Diagnosis not present

## 2019-03-04 DIAGNOSIS — I6389 Other cerebral infarction: Secondary | ICD-10-CM | POA: Diagnosis not present

## 2019-03-04 DIAGNOSIS — E441 Mild protein-calorie malnutrition: Secondary | ICD-10-CM | POA: Diagnosis present

## 2019-03-04 DIAGNOSIS — M810 Age-related osteoporosis without current pathological fracture: Secondary | ICD-10-CM | POA: Diagnosis present

## 2019-03-04 DIAGNOSIS — I5032 Chronic diastolic (congestive) heart failure: Secondary | ICD-10-CM | POA: Diagnosis present

## 2019-03-04 DIAGNOSIS — E871 Hypo-osmolality and hyponatremia: Secondary | ICD-10-CM | POA: Diagnosis not present

## 2019-03-04 DIAGNOSIS — D62 Acute posthemorrhagic anemia: Secondary | ICD-10-CM | POA: Diagnosis not present

## 2019-03-04 DIAGNOSIS — Z800 Family history of malignant neoplasm of digestive organs: Secondary | ICD-10-CM | POA: Diagnosis not present

## 2019-03-04 DIAGNOSIS — I272 Pulmonary hypertension, unspecified: Secondary | ICD-10-CM | POA: Diagnosis present

## 2019-03-04 DIAGNOSIS — G8191 Hemiplegia, unspecified affecting right dominant side: Secondary | ICD-10-CM | POA: Diagnosis present

## 2019-03-04 DIAGNOSIS — I724 Aneurysm of artery of lower extremity: Secondary | ICD-10-CM | POA: Diagnosis present

## 2019-03-04 DIAGNOSIS — Z8249 Family history of ischemic heart disease and other diseases of the circulatory system: Secondary | ICD-10-CM | POA: Diagnosis not present

## 2019-03-04 HISTORY — PX: IR CT HEAD LTD: IMG2386

## 2019-03-04 HISTORY — PX: IR PERCUTANEOUS ART THROMBECTOMY/INFUSION INTRACRANIAL INC DIAG ANGIO: IMG6087

## 2019-03-04 HISTORY — PX: IR US GUIDE VASC ACCESS RIGHT: IMG2390

## 2019-03-04 LAB — CBC WITH DIFFERENTIAL/PLATELET
Abs Immature Granulocytes: 0.28 10*3/uL — ABNORMAL HIGH (ref 0.00–0.07)
Basophils Absolute: 0 10*3/uL (ref 0.0–0.1)
Basophils Relative: 0 %
Eosinophils Absolute: 0.1 10*3/uL (ref 0.0–0.5)
Eosinophils Relative: 1 %
HCT: 37.6 % — ABNORMAL LOW (ref 39.0–52.0)
Hemoglobin: 12.8 g/dL — ABNORMAL LOW (ref 13.0–17.0)
Immature Granulocytes: 2 %
Lymphocytes Relative: 9 %
Lymphs Abs: 1.4 10*3/uL (ref 0.7–4.0)
MCH: 34.2 pg — ABNORMAL HIGH (ref 26.0–34.0)
MCHC: 34 g/dL (ref 30.0–36.0)
MCV: 100.5 fL — ABNORMAL HIGH (ref 80.0–100.0)
Monocytes Absolute: 1.8 10*3/uL — ABNORMAL HIGH (ref 0.1–1.0)
Monocytes Relative: 12 %
Neutro Abs: 11.3 10*3/uL — ABNORMAL HIGH (ref 1.7–7.7)
Neutrophils Relative %: 76 %
Platelets: 591 10*3/uL — ABNORMAL HIGH (ref 150–400)
RBC: 3.74 MIL/uL — ABNORMAL LOW (ref 4.22–5.81)
RDW: 12 % (ref 11.5–15.5)
WBC: 14.8 10*3/uL — ABNORMAL HIGH (ref 4.0–10.5)
nRBC: 0 % (ref 0.0–0.2)

## 2019-03-04 LAB — COMPREHENSIVE METABOLIC PANEL
ALT: 18 U/L (ref 0–44)
AST: 21 U/L (ref 15–41)
Albumin: 3 g/dL — ABNORMAL LOW (ref 3.5–5.0)
Alkaline Phosphatase: 59 U/L (ref 38–126)
Anion gap: 7 (ref 5–15)
BUN: 15 mg/dL (ref 8–23)
CO2: 26 mmol/L (ref 22–32)
Calcium: 8.4 mg/dL — ABNORMAL LOW (ref 8.9–10.3)
Chloride: 102 mmol/L (ref 98–111)
Creatinine, Ser: 0.93 mg/dL (ref 0.61–1.24)
GFR calc Af Amer: >60 mL/min (ref >60)
GFR calc non Af Amer: >60 mL/min (ref >60)
Glucose, Bld: 148 mg/dL — ABNORMAL HIGH (ref 70–99)
Potassium: 3.9 mmol/L (ref 3.5–5.1)
Sodium: 135 mmol/L (ref 135–145)
Total Bilirubin: 0.9 mg/dL (ref 0.3–1.2)
Total Protein: 5.3 g/dL — ABNORMAL LOW (ref 6.5–8.1)

## 2019-03-04 LAB — I-STAT CHEM 8, ED
BUN: 23 mg/dL (ref 8–23)
Calcium, Ion: 1.11 mmol/L — ABNORMAL LOW (ref 1.15–1.40)
Chloride: 95 mmol/L — ABNORMAL LOW (ref 98–111)
Creatinine, Ser: 1.1 mg/dL (ref 0.61–1.24)
Glucose, Bld: 159 mg/dL — ABNORMAL HIGH (ref 70–99)
HCT: 44 % (ref 39.0–52.0)
Hemoglobin: 15 g/dL (ref 13.0–17.0)
Potassium: 3.8 mmol/L (ref 3.5–5.1)
Sodium: 131 mmol/L — ABNORMAL LOW (ref 135–145)
TCO2: 28 mmol/L (ref 22–32)

## 2019-03-04 LAB — HEMOGLOBIN A1C
Hgb A1c MFr Bld: 4.9 % (ref 4.8–5.6)
Mean Plasma Glucose: 93.93 mg/dL

## 2019-03-04 LAB — LIPID PANEL
Cholesterol: 158 mg/dL (ref 0–200)
HDL: 58 mg/dL (ref >40)
LDL Cholesterol: 87 mg/dL (ref 0–99)
Total CHOL/HDL Ratio: 2.7 RATIO
Triglycerides: 64 mg/dL (ref <150)
VLDL: 13 mg/dL (ref 0–40)

## 2019-03-04 LAB — RESPIRATORY PANEL BY RT PCR (FLU A&B, COVID)
Influenza A by PCR: NEGATIVE
Influenza B by PCR: NEGATIVE
SARS Coronavirus 2 by RT PCR: NEGATIVE

## 2019-03-04 LAB — MRSA PCR SCREENING: MRSA by PCR: NEGATIVE

## 2019-03-04 MED ORDER — SENNOSIDES-DOCUSATE SODIUM 8.6-50 MG PO TABS: 1.0000 | ORAL_TABLET | Freq: Every evening | ORAL | Status: DC | PRN

## 2019-03-04 MED ORDER — CHLORHEXIDINE GLUCONATE CLOTH 2 % EX PADS
6.0000 | MEDICATED_PAD | Freq: Every day | CUTANEOUS | Status: DC
  Administered 2019-03-04 – 2019-03-07 (×4): 6 via TOPICAL

## 2019-03-04 MED ORDER — HYDRALAZINE HCL 20 MG/ML IJ SOLN
5.0000 mg | INTRAMUSCULAR | Status: DC | PRN
  Administered 2019-03-04 (×2): 5 mg via INTRAVENOUS
  Administered 2019-03-04: 10 mg via INTRAVENOUS
  Administered 2019-03-04: 01:00:00 5 mg via INTRAVENOUS
  Filled 2019-03-04: qty 1

## 2019-03-04 MED ORDER — FENTANYL CITRATE (PF) 100 MCG/2ML IJ SOLN
INTRAMUSCULAR | Status: DC | PRN
  Administered 2019-03-04: 25 ug via INTRAVENOUS

## 2019-03-04 MED ORDER — LISINOPRIL 20 MG PO TABS
40.0000 mg | ORAL_TABLET | Freq: Every day | ORAL | Status: DC
  Administered 2019-03-05 – 2019-03-08 (×3): 40 mg via ORAL
  Filled 2019-03-04 (×4): qty 2

## 2019-03-04 MED ORDER — PANTOPRAZOLE SODIUM 40 MG IV SOLR
40.0000 mg | Freq: Every day | INTRAVENOUS | Status: DC
  Administered 2019-03-04 – 2019-03-05 (×2): 40 mg via INTRAVENOUS
  Filled 2019-03-04 (×2): qty 40

## 2019-03-04 MED ORDER — ACETAMINOPHEN 160 MG/5ML PO SOLN: 650.0000 mg | ORAL | Status: DC | PRN

## 2019-03-04 MED ORDER — VITAMIN B-12 1000 MCG PO TABS
5000.0000 ug | ORAL_TABLET | Freq: Every day | ORAL | Status: DC
  Administered 2019-03-04 – 2019-03-08 (×4): 5000 ug via ORAL
  Filled 2019-03-04 (×5): qty 5

## 2019-03-04 MED ORDER — ACETAMINOPHEN 325 MG PO TABS: 650.0000 mg | ORAL_TABLET | ORAL | Status: DC | PRN

## 2019-03-04 MED ORDER — SODIUM CHLORIDE 0.9 % IV SOLN: INTRAVENOUS | Status: DC

## 2019-03-04 MED ORDER — SUGAMMADEX SODIUM 200 MG/2ML IV SOLN
INTRAVENOUS | Status: DC | PRN
  Administered 2019-03-04: 200 mg via INTRAVENOUS

## 2019-03-04 MED ORDER — IOHEXOL 300 MG/ML  SOLN
150.0000 mL | Freq: Once | INTRAMUSCULAR | Status: AC | PRN
  Administered 2019-03-04: 50 mL via INTRA_ARTERIAL

## 2019-03-04 MED ORDER — HYDROXYUREA 500 MG PO CAPS
500.0000 mg | ORAL_CAPSULE | ORAL | Status: DC
  Administered 2019-03-04 – 2019-03-08 (×3): 500 mg via ORAL
  Filled 2019-03-04 (×3): qty 1

## 2019-03-04 MED ORDER — STROKE: EARLY STAGES OF RECOVERY BOOK
Freq: Once | Status: AC
  Filled 2019-03-04: qty 1

## 2019-03-04 MED ORDER — ACETAMINOPHEN 650 MG RE SUPP: 650.0000 mg | RECTAL | Status: DC | PRN

## 2019-03-04 MED ORDER — HYDRALAZINE HCL 20 MG/ML IJ SOLN: 5.0000 mg | INTRAMUSCULAR | Status: DC | PRN

## 2019-03-04 MED ORDER — ONDANSETRON HCL 4 MG/2ML IJ SOLN
INTRAMUSCULAR | Status: DC | PRN
  Administered 2019-03-04: 4 mg via INTRAVENOUS

## 2019-03-04 MED ORDER — HYDRALAZINE HCL 50 MG PO TABS
50.0000 mg | ORAL_TABLET | Freq: Three times a day (TID) | ORAL | Status: DC
  Administered 2019-03-05 – 2019-03-07 (×4): 50 mg via ORAL
  Filled 2019-03-04 (×5): qty 1

## 2019-03-04 MED ORDER — HYDRALAZINE HCL 20 MG/ML IJ SOLN: INTRAMUSCULAR | Status: DC | PRN

## 2019-03-04 NOTE — Progress Notes (Signed)
STROKE TEAM PROGRESS NOTE   HISTORY OF PRESENT ILLNESS (per record) Jimmy Jimenez is an 86 y.o. male who was recently discharged from [facility] on Saturday afternoon after having been admitted there for treatment of acute on chronic respiratory failure with hypoxia. He presented to the ED at [facility] on Saturday night after being noted to be suddenly aphasic with right sided paralysis at home.   Per Rapid Response RN note: "Pt arrived in ED at 2223. At 2030 pt became aphasic with R sided paralysis. During transport to ED, pt's symptoms resolved completely. On arrival, at 2223, pt's symptoms were still resolved. At 2227, pt developed R facial droop and R sided weakness, Code stroke initiated at 2236. Pt transported to CT. While in CT, pt developed aphasia and dysarthria in addition to R facial droop and R leg drift. NIH-4, CBG-144".  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   LSN: [id number].  tPA Given: Yes   INTERVAL HISTORY His nurse is at bedside. He says he is "feeling good". No new issues.     OBJECTIVE Vitals:   03/04/19 0800 03/04/19 0851 03/04/19 0900 03/04/19 1000  BP: (!) 108/40 (!) 192/55 (!) 125/47 (!) 115/49  Pulse: 63   69  Resp: 15   20  Temp: (!) 97.1 F (36.2 C)     TempSrc: Oral     SpO2:    99%  Weight:      Height:        CBC:  Recent Labs  Lab 03/03/19 0339 03/03/19 2237 03/03/19 2247  WBC 8.5 16.6*  --   NEUTROABS  --  14.1*  --   HGB 12.9* 13.8 15.0  HCT 37.6* 41.3 44.0  MCV 97.9 102.0*  --   PLT 537* 677*  --     Basic Metabolic Panel:  Recent Labs  Lab 03/03/19 0339 03/03/19 2237 03/03/19 2247  NA 132* 131* 131*  K 3.8 3.8 3.8  CL 96* 97* 95*  CO2 30 25  --   GLUCOSE 139* 167* 159*  BUN 14 19 23   CREATININE 0.78 1.17 1.10  CALCIUM 8.6* 9.2  --     Lipid Panel:     Component Value Date/Time   CHOL 158 03/04/2019 0449   TRIG 64 03/04/2019 0449   HDL  58 03/04/2019 0449   CHOLHDL 2.7 03/04/2019 0449   VLDL 13 03/04/2019 0449   LDLCALC 87 03/04/2019 0449   HgbA1c:  Lab Results  Component Value Date   HGBA1C 4.9 03/04/2019   Urine Drug Screen: No results found for: LABOPIA, COCAINSCRNUR, LABBENZ, AMPHETMU, THCU, LABBARB  Alcohol Level No results found for: Gregg  IMAGING  CT Code Stroke CTA Head W/WO contrast CT Code Stroke CTA Neck W/WO contrast CT Code Stroke Cerebral Perfusion with contrast 03/03/2019 IMPRESSION:   CTA HEAD AND NECK  IMPRESSION:  1. Positive CTA for LVO with occlusive thrombus within a proximal left M2 branch, superior division.  2. Heavy calcified plaque throughout the carotid siphons with associated moderate to severe multifocal narrowing.  3. Short-segment severe proximal right M2 and distal left P2 stenoses.  4. Approximate 50% atheromatous stenoses at the origin of the right vertebral artery as well as involving the left V4 segment.   CT PERFUSION  IMPRESSION:  1. 11 cc acute core infarct involving the deep white matter of the left cerebral hemisphere, watershed in distribution. Surrounding 55 cc ischemic penumbra involving much of the  left MCA.  2. Aspects equals 9 on prior head CT.   [place] 03/04/2019 IMPRESSION:  Status post ultrasound guided access right common femoral artery for left-sided cervical and cerebral angiogram and mechanical thrombectomy of left M1 occlusion via single pass of Trevo 3x32 NXT device. Angio-Seal deployed for hemostasis.   PLAN: Admit to ICU Patient was extubated in the room Blood pressure target of 0000000 systolic Right hip straight times 8 hours, status post Angio-Seal deployment.  CT HEAD CODE STROKE WO CONTRAST 03/03/2019 IMPRESSION:  1. Asymmetric hyperdensity involving the distal left M1 and/or proximal M2 branch, concerning for LVO. Subtle hypodensity involving the left caudate concerning for evolving left MCA territory ischemia. No intracranial hemorrhage. 2.  ASPECTS is 9.  3. Underlying age-related cerebral atrophy with moderate chronic small vessel ischemic disease.   Transthoracic Echocardiogram  00/00/2020 Pending   ECG - SR rate 84 BPM. LVH (See cardiology reading for complete details)    PHYSICAL EXAM Blood pressure (!) 115/49, pulse 69, temperature (!) 97.1 F (36.2 C), temperature source Oral, resp. rate 20, height 5\' 8"  (1.727 m), weight 62.6 kg, SpO2 99 %.  Pleasant elderly male.  Patient is laying in bed, patient has eyes open, alert, improved dysarthria, following simple commands, pupils equally round and reactive to light, blinks to threat bilaterally, extraocular movements intact, right facial droop, intact sensation on the face, hearing intact to voice, tongue midline, he has some right mild hemiparesis but he is intact to light touch in all extremities.  No ataxia or dysmetria finger-to-nose.  Gait deferred.   ASSESSMENT/PLAN [name] is a 86 y.o. 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 IV tPA Saturday 03/03/19 at 2315. IR - mechanical thrombectomy of left M1 occlusion  Stroke:  Left MCA infarct - embolic - source unknown  Code Stroke CT Head - Asymmetric hyperdensity involving the distal left M1 and/or proximal M2 branch, concerning for LVO. Subtle hypodensity involving the left caudate concerning for evolving left MCA territory ischemia. No intracranial hemorrhage. ASPECTS is 9. Underlying age-related cerebral atrophy with moderate chronic small vessel ischemic disease.   CT head - pending  MRI head - pending  MRA head - not ordered  CTA H&N - Positive CTA for LVO with occlusive thrombus within a proximal left M2 branch, superior division. Heavy calcified plaque  throughout the carotid siphons with associated moderate to severe multifocal narrowing. Short-segment severe proximal right M2 and distal left P2 stenoses. Approximate 50% atheromatous stenoses at the origin of the right vertebral artery as well as involving the left V4 segmen  CT Perfusion - 11 cc acute core infarct involving the deep white matter of the left cerebral hemisphere, watershed in distribution. Surrounding 55 cc ischemic penumbra involving much of the left MCA.   Carotid Doppler - CTA neck performed - carotid dopplers not indicated.  2D Echo - pending, May need TEE pending results  Lacey Jensen Virus 2 - negative  LDL - 87  HgbA1c - 4.8  UDS - not ordered  VTE prophylaxis - SCDs Diet  Diet Order            Diet regular Room service appropriate? Yes with Assist; Fluid consistency: Thin  Diet effective now  aspirin 81 mg daily prior to admission, now on No antithrombotic post tPA  Patient will be counseled to be compliant with his antithrombotic medications  Ongoing aggressive stroke risk factor management  Therapy recommendations:  pending  Disposition:  Pending  Hypertension  Home BP meds: Amlodipine, Zestril, hydralazine, lasix  Current BP meds: hydralazine and lisinopril  SBP mildly low   Initial SBP goal < 160 post tPA / thrombectomy . Long-term BP goal normotensive  Hyperlipidemia  Home Lipid lowering medication: none   LDL 104, goal < 70  Current lipid lowering medication: none / NPO - start Lipitor 40 mg daily  Continue statin at discharge  Other Stroke Risk Factors  Advanced age  Other Active Problems  Leukocytosis - 16.6 (afebrile) - repeat CBC pending follow  Hyponatremia - 131 - Repeat Labs pending, please follow  Thrombocytosis - 677  Mild Bradycardia   Hospital day # 0  Patient doing well. Acute left M2 occlusion. S/P tPA and VIR. LVO with occlusive thrombus within a proximal left M2 branch, superior  division. Labs show leukocytosis and mild hyponatremia, follow. Afebrile however. Follow labs and if these persist then needs infectious workup.  Personally examined patient and images, and have participated in and made any corrections needed to history, physical, neuro exam,assessment and plan as stated above.  I have personally obtained the history, evaluated lab date, reviewed imaging studies and agree with radiology interpretations.    Sarina Ill, MD Stroke Neurology   A total of 35 minutes was spent for the care of this patient, spent on counseling patient and family on different diagnostic and therapeutic options, counseling and coordination of care, riskd ans benefits of management, compliance, or risk factor reduction and education.   To contact Stroke Continuity provider, please refer to [email or web address] After hours, contact General Neurology

## 2019-03-04 NOTE — Procedures (Signed)
Neuro-Interventional Radiology  Post Cerebral Angiogram Procedure Note  History:   86 yo male with sudden onset right sided symptoms and aphasia 8:30pm tonight, with EMS transport to the ED.  Symptoms completely resolved during transport, with then a witnessed decline about 10 minutes after arriving at the ED, with recurrent right side symptoms and aphasia.    CT imaging work up shows left M1 occlusion.   Baseline mRS:  0   IV tPA administered?: yes IA Medication:  no  Initial mTICI score  M1 = TICI 0  Final mTICI Score    TICI 3  Device:   Trevo 3 x 32 mechanical thrombectomy  Anesthesia    GETA  Procedure:  - US guided R CFA access - Cerebral Angiogram - Mechanical thrombectomy of ELVO involving left M1 - Deployment of Angioseal for hemostasis - Flat panel CT in NIR suite  Findings:  Tortuous anatomy, with common origin of the innominate artery and the left CCA Left M1 occlusion, with jpatent early temporal branch. TICI 0 flow of the distal M1. Final flow is TICI 3  Complications: None  EBL: 75cc  Recommendations: - right hip straight overnight - Goal SBP 120-140.   - Frequent NV checks - Repeat CT or MRI imaging recommended discretion of Neurology - NIR to follow  Signed,  Dulcy Fanny. Earleen Newport, DO

## 2019-03-04 NOTE — Progress Notes (Signed)
Physical Therapy Evaluation Patient Details Name: Jimmy Jimenez MRN: [id number] DOB: 03/13/1925 Today's Date: 03/04/2019   History of Present Illness  Jimmy Jimenez is an 86 y.o. male who was recently discharged from [facility] on Saturday afternoon after having been admitted there for treatment of acute on chronic respiratory failure with hypoxia. He presented to the ED at [facility] on Saturday night after being noted to be suddenly aphasic with right sided weakness at home. CT showing L MCA M2 infarct and more in a watershed distribution.  PMH: HTN, osteoporosis  Clinical Impression  Pt admitted with/for stroke.  Pt working back toward baseline functioning needing min guard assist for most mobility at this time..  Pt currently limited functionally due to the problems listed below.  (see problems list.)  Pt will benefit from PT to maximize function and safety to be able to get home safely with available assist.     Follow Up Recommendations Home health PT;Supervision - Intermittent    Equipment Recommendations  None recommended by PT    Recommendations for Other Services       Precautions / Restrictions Precautions Precautions: Fall      Mobility  Bed Mobility Overal bed mobility: Needs Assistance Bed Mobility: Supine to Sit;Sit to Supine     Supine to sit: Supervision Sit to supine: Supervision   General bed mobility comments: slow, but no need for rail or HOB up.  Transfers Overall transfer level: Needs assistance   Transfers: Sit to/from Stand;Stand Pivot Transfers Sit to Stand: Min guard Stand pivot transfers: Min guard       General transfer comment: slow and tentative, but with no assistance needs.  Ambulation/Gait Ambulation/Gait assistance: Min guard Gait Distance (Feet): 70 Feet Assistive device: IV Pole;None Gait Pattern/deviations: Step-through pattern Gait velocity: slower and guarded Gait velocity interpretation: <1.8 ft/sec, indicate of risk for recurrent  falls General Gait Details: steady but tentative and weak appearing.  Without AD, pt slows and with hands on thighs, but states he feel close to normal.  Stairs            Wheelchair Mobility    Modified Rankin (Stroke Patients Only) Modified Rankin (Stroke Patients Only) Pre-Morbid Rankin Score: No significant disability Modified Rankin: Moderate disability     Balance Overall balance assessment: Needs assistance Sitting-balance support: No upper extremity supported Sitting balance-Leahy Scale: Good Sitting balance - Comments: accepted minimal challenge before struggled to stay upright.     Standing balance-Leahy Scale: Fair                               Pertinent Vitals/Pain Pain Assessment: No/denies pain    Home Living Family/patient expects to be discharged to:: Private residence Living Arrangements: Spouse/significant other Available Help at Discharge: Family;Available 24 hours/day Type of Home: House Home Access: Stairs to enter Entrance Stairs-Rails: None Entrance Stairs-Number of Steps: 2-3 Home Layout: One level Home Equipment: Walker - 2 wheels;Grab bars - toilet;Grab bars - tub/shower      Prior Function Level of Independence: Independent         Comments: drive, run errands, but my wife does most errands.     Hand Dominance        Extremity/Trunk Assessment   Upper Extremity Assessment Upper Extremity Assessment: Defer to OT evaluation    Lower Extremity Assessment Lower Extremity Assessment: Generalized weakness(RLE incrementally weaker than L LE)    Cervical / Trunk Assessment Cervical / Trunk Assessment:  Kyphotic  Communication   Communication: No difficulties  Cognition Arousal/Alertness: Awake/alert Behavior During Therapy: WFL for tasks assessed/performed Overall Cognitive Status: Within Functional Limits for tasks assessed                                        General Comments General  comments (skin integrity, edema, etc.): sats on RA finally started to fall into the lower 80's (with good waveform).  Rebounded after 2L [place] to 96%    Exercises     Assessment/Plan    PT Assessment Patient needs continued PT services  PT Problem List Decreased strength;Decreased activity tolerance;Decreased balance;Decreased mobility;Cardiopulmonary status limiting activity       PT Treatment Interventions Gait training;Stair training;Functional mobility training;Therapeutic activities;Balance training;Neuromuscular re-education;Patient/family education    PT Goals (Current goals can be found in the Care Plan section)  Acute Rehab PT Goals Patient Stated Goal: home when ready PT Goal Formulation: With patient Time For Goal Achievement: 03/11/19 Potential to Achieve Goals: Good    Frequency Min 4X/week   Barriers to discharge        Co-evaluation               AM-PAC PT "6 Clicks" Mobility  Outcome Measure Help needed turning from your back to your side while in a flat bed without using bedrails?: A Little Help needed moving from lying on your back to sitting on the side of a flat bed without using bedrails?: A Little Help needed moving to and from a bed to a chair (including a wheelchair)?: A Little Help needed standing up from a chair using your arms (e.g., wheelchair or bedside chair)?: A Little Help needed to walk in hospital room?: A Little Help needed climbing 3-5 steps with a railing? : A Little 6 Click Score: 18    End of Session   Activity Tolerance: Patient tolerated treatment well Patient left: in bed;with call bell/phone within reach;with bed alarm set Nurse Communication: Mobility status PT Visit Diagnosis: Other symptoms and signs involving the nervous system (R29.898);Muscle weakness (generalized) (M62.81)    Time: [id number] PT Time Calculation (min) (ACUTE ONLY): 21 min   Charges:   PT Evaluation $PT Eval Low Complexity: 1 Low           03/04/2019  Ginger Carne., PT Acute Rehabilitation Services [phone]  (pager) [phone]  (office)  [name] Irlanda Croghan 03/04/2019, 5:14 PM

## 2019-03-04 NOTE — Progress Notes (Signed)
Despite a great wave form, the ulnar art line has grossly higher readings than monitor BP. With diastolic readings in the [id number] for both the art and monitor BP I was hesitant to treat the art line pressure. Discussed with Dr. Earleen Newport and Dr. Jaynee Eagles.   Dr. Jaynee Eagles states we should treat the BP that best correlates with a manual reading.  Manual reading is 130/42. I will continue to monitor and treat BP according to the monitor cuff.

## 2019-03-04 NOTE — Progress Notes (Signed)
SLP Cancellation Note  Patient Details Name: Jimmy Jimenez MRN: [id number] DOB: 03/13/1925   Cancelled treatment:       Reason Eval/Treat Not Completed: Other (comment). Pt passed [employer]. RN will initiate diet. Will f/u this week for cognitive linguistic eval.   Herbie Baltimore, MA [place] Pager [phone] Office [phone]  Lynann Beaver 03/04/2019, 10:39 AM

## 2019-03-04 NOTE — Progress Notes (Signed)
Referring Physician(s): Code Stroke- Kerney Elbe  Supervising Physician: Corrie Mckusick  Patient Status:  [facility] - In-pt  Chief Complaint: None  Subjective:  History of acute CVA s/p emergent mechanical thrombectomy of left MCA M1 segment occlusion achieving a TICI 3 revascularization 03/03/2019 by Dr. Earleen Newport. Patient awake and alert laying in bed with no complaints at this time. Moving all 4s. Right groin incision c/d/i.   Allergies: Amlodipine, Atenolol, Erythromycin, Furosemide, Hctz [hydrochlorothiazide], Hydrochlorothiazide w-triamterene, Levofloxacin, Metoprolol, Micardis [telmisartan], Penicillins, Terazosin, and Triamterene-hctz  Medications: Prior to Admission medications   Medication Sig Start Date End Date Taking? Authorizing Provider  albuterol (VENTOLIN HFA) 108 (90 Base) MCG/ACT inhaler Inhale 2 puffs into the lungs every 6 (six) hours as needed for wheezing or shortness of breath. 03/03/19  Yes Wieting, Richard, MD  amLODipine (NORVASC) 5 MG tablet TAKE 1 TABLET BY MOUTH ONCE DAILY 12/26/18  Yes Crecencio Mc, MD  aspirin EC 81 MG tablet Take 81 mg by mouth daily. Reported on 08/14/2015   Yes [provider]  azelastine (ASTELIN) 0.1 % nasal spray Place 2 sprays into both nostrils 2 (two) times daily.  02/10/19  Yes [provider]  beclomethasone (QVAR REDIHALER) 40 MCG/ACT inhaler Inhale 1 puff into the lungs 2 (two) times daily. 03/03/19  Yes Wieting, Richard, MD  benzonatate (TESSALON) 200 MG capsule Take 1 capsule (200 mg total) by mouth 2 (two) times daily as needed for cough. 01/04/19  Yes Crecencio Mc, MD  Calcium Citrate-Vitamin D (CITRACAL + D PO) Take 2 tablets by mouth daily. Take 2 by mouth daily, calcium 630 and vitamin D 500 each.    Yes [provider]  Cholecalciferol (VITAMIN D3) 1000 UNITS CAPS Take 2,000 Units by mouth daily. Take 2 by mouth daily   Yes [provider]  clotrimazole-betamethasone (LOTRISONE) cream  Apply topically 2 (two) times daily. 11/28/18  Yes Crecencio Mc, MD  Cyanocobalamin (B-12 COMPLIANCE INJECTION) 1000 MCG/ML KIT Inject 1,000 mcg as directed every 30 (thirty) days.    Yes [provider]  cyanocobalamin 1000 MCG tablet Take 5,000 mcg by mouth daily.   Yes [provider]  docusate sodium (COLACE) 100 MG capsule Take 100 mg by mouth 2 (two) times daily as needed for mild constipation.   Yes [provider]  esomeprazole (NEXIUM) 40 MG capsule TAKE 1 CAPSULE BY MOUTH ONCE DAILY AT NOON Patient taking differently: Take 40 mg by mouth daily.  11/27/18  Yes Crecencio Mc, MD  furosemide (LASIX) 20 MG tablet TAKE 1 TABLET BY MOUTH EVERY OTHER DAY IN MORNING Patient taking differently: Take 20 mg by mouth every other day. TAKE 1 TABLET BY MOUTH EVERY OTHER DAY IN MORNING 10/31/18  Yes Crecencio Mc, MD  hydrALAZINE (APRESOLINE) 50 MG tablet TAKE 1 TABLET BY MOUTH 3 TIMES DAILY 02/26/19  Yes Crecencio Mc, MD  hydroxyurea (HYDREA) 500 MG capsule TAKE 1 CAPSULE BY MOUTH EVERY OTHER DAY MAY TAKE WITH FOOD TO MINIMIZE GI SIDE EFFECTS. 08/08/18  Yes Cammie Sickle, MD  Infant Care Products ([place]) CREA Apply 1 application topically as needed (after each bowel movement for barrier for  pressure ulcer.). 02/15/19  Yes Crecencio Mc, MD  lisinopril (ZESTRIL) 40 MG tablet TAKE 1 TABLET BY MOUTH ONCE DAILY 09/15/18  Yes Crecencio Mc, MD  doxycycline (VIBRA-TABS) 100 MG tablet Take 1 tablet (100 mg total) by mouth every 12 (twelve) hours for 6 days. 03/03/19 03/09/19  [place],  Richard, MD  testosterone cypionate (DEPOTESTOSTERONE CYPIONATE) 200 MG/ML injection INJECT 0.25 ML INTRAMUSCULARLY EVERY 14 DAYS AS DIRECTED 02/16/19   Crecencio Mc, MD     Vital Signs: BP (!) 115/49    Pulse 69    Temp (!) 97.1 F (36.2 C) (Oral)    Resp 20    Ht 5' 8"  (1.727 m)    Wt 138 lb 0.1 oz (62.6 kg)    SpO2 99%    BMI 20.98 kg/m   Physical Exam Vitals and nursing  note reviewed.  Constitutional:      General: He is not in acute distress.    Appearance: Normal appearance.  Pulmonary:     Effort: Pulmonary effort is normal. No respiratory distress.  Skin:    General: Skin is warm and dry.     Comments: Right groin incision soft without active bleeding or hematoma.  Neurological:     Mental Status: He is alert.     Comments: Alert, awake, and oriented x3. Speech and comprehension intact. PERRL bilaterally. No facial asymmetry. Tongue midline. Can spontaneously move all extremities. No pronator drift. Distal pulses 2+ bilaterally.     Imaging: CT Code Stroke CTA Head W/WO contrast  Result Date: 03/03/2019 CLINICAL DATA:  Initial evaluation for acute stroke. EXAM: CT ANGIOGRAPHY HEAD AND NECK CT PERFUSION BRAIN TECHNIQUE: Multidetector CT imaging of the head and neck was performed using the standard protocol during bolus administration of intravenous contrast. Multiplanar CT image reconstructions and MIPs were obtained to evaluate the vascular anatomy. Carotid stenosis measurements (when applicable) are obtained utilizing NASCET criteria, using the distal internal carotid diameter as the denominator. Multiphase CT imaging of the brain was performed following IV bolus contrast injection. Subsequent parametric perfusion maps were calculated using RAPID software. CONTRAST:  170m OMNIPAQUE IOHEXOL 350 MG/ML SOLN COMPARISON:  Prior noncontrast head CT from earlier same day. FINDINGS: CTA NECK FINDINGS Aortic arch: Visualized aortic arch of normal caliber with normal branch pattern. Moderate atherosclerotic change about the arch and origin of the great vessels without hemodynamically significant stenosis. Visualized subclavian arteries patent. Right carotid system: Right common and internal carotid arteries are mildly tortuous but widely patent without significant stenosis, dissection or occlusion. Left carotid system: Left common and internal carotid arteries  are mildly tortuous but widely patent without significant stenosis, dissection or occlusion. Vertebral arteries: Both vertebral arteries arise from the subclavian arteries. Focal plaque at the origin of the right vertebral artery with associated 50% stenosis. Focal non stenotic plaque noted at the origin of the left vertebral artery as well. Vertebral arteries tortuous but otherwise patent within the neck without stenosis, dissection, or occlusion. Skeleton: No acute osseous abnormality. No worrisome lytic or blastic osseous lesions. Moderate multilevel cervical spondylosis without significant stenosis. Other neck: No other acute soft tissue abnormality within the neck. No mass lesion or adenopathy. Upper chest: Mildly enlarged and partially calcified 12 mm precarinal lymph node noted. Patchy multifocal ground-glass opacities seen within the partially visualized lungs, similar to previous. Superimposed scattered subpleural reticular densities and fibrotic changes. Superimposed 9 mm nodular density at the posterior left upper lobe (series 5, image 169). Additional 9 mm pleural base nodular density partially visualized at the anterior right upper lobe (series 5, image 189). Review of the MIP images confirms the above findings CTA HEAD FINDINGS Anterior circulation: Scattered calcified plaque throughout the petrous, cavernous, and supraclinoid ICAs with associated moderate to severe multifocal narrowing. A1 segments patent. Normal anterior communicating artery. Anterior cerebral arteries widely  patent to their distal aspects without stenosis. Left M1 patent. There is occlusive thrombus within the proximal left M2 branch, superior division. Attenuated collateral flow seen distally. Anterior temporal/inferior division remains patent. Right M1 widely patent. Normal right MCA bifurcation. Short-segment severe stenosis at the origin of the right M2 inferior division. Right MCA branches well perfused distally. Posterior  circulation: Calcified atheromatous plaque within the dominant left V4 segment with associated moderate multifocal stenosis. Diminutive right vertebral artery widely patent the vertebrobasilar junction. Posterior inferior cerebral arteries patent bilaterally. Basilar widely patent to its distal aspect. Superior cerebral arteries patent bilaterally. Both PCAs primarily supplied via the basilar. Short-segment severe distal left P2 stenosis noted. PCAs otherwise patent to their distal aspects without stenosis. Venous sinuses: Patent. Anatomic variants: None significant. Review of the MIP images confirms the above findings CT Brain Perfusion Findings: ASPECTS: 9 CBF (<30%) Volume: 5m Perfusion (Tmax>6.0s) volume: 547mMismatch Volume: 4461mnfarction Location:Patchy small volume acute core infarct seen involving the deep white matter of the left cerebral hemisphere, somewhat watershed in distribution. Surrounding ischemic penumbra of 55 cc, with mismatch volume of 44 cc. IMPRESSION: CTA HEAD AND NECK IMPRESSION: 1. Positive CTA for LVO with occlusive thrombus within a proximal left M2 branch, superior division. 2. Heavy calcified plaque throughout the carotid siphons with associated moderate to severe multifocal narrowing. 3. Short-segment severe proximal right M2 and distal left P2 stenoses. 4. Approximate 50% atheromatous stenoses at the origin of the right vertebral artery as well as involving the left V4 segment. CT PERFUSION IMPRESSION: 1. 11 cc acute core infarct involving the deep white matter of the left cerebral hemisphere, watershed in distribution. Surrounding 55 cc ischemic penumbra involving much of the left MCA. 2. Aspects equals 9 on prior head CT. These results were communicated to Dr. LinCheral Marker 11:22 pmon 1/2/2021by text page via the AMIBl[facility] Limited Liability Partnershipssaging system. Electronically Signed   By: BenJeannine BogaD.   On: 03/03/2019 23:44   DG Chest 2 View  Result Date: 03/01/2019 CLINICAL DATA:   Shortness of breath, cough EXAM: CHEST - 2 VIEW COMPARISON:  09/28/2018 FINDINGS: Stable cardiomediastinal contours. Calcified thoracic aorta. Chronic interstitial lung changes without definite new superimposed airspace opacity. No pleural effusion or pneumothorax. IMPRESSION: Chronic interstitial lung changes without definite superimposed airspace opacity Electronically Signed   By: NicDavina PokeO.   On: 03/01/2019 16:48   CT Code Stroke CTA Neck W/WO contrast  Result Date: 03/03/2019 CLINICAL DATA:  Initial evaluation for acute stroke. EXAM: CT ANGIOGRAPHY HEAD AND NECK CT PERFUSION BRAIN TECHNIQUE: Multidetector CT imaging of the head and neck was performed using the standard protocol during bolus administration of intravenous contrast. Multiplanar CT image reconstructions and MIPs were obtained to evaluate the vascular anatomy. Carotid stenosis measurements (when applicable) are obtained utilizing NASCET criteria, using the distal internal carotid diameter as the denominator. Multiphase CT imaging of the brain was performed following IV bolus contrast injection. Subsequent parametric perfusion maps were calculated using RAPID software. CONTRAST:  115m34mNIPAQUE IOHEXOL 350 MG/ML SOLN COMPARISON:  Prior noncontrast head CT from earlier same day. FINDINGS: CTA NECK FINDINGS Aortic arch: Visualized aortic arch of normal caliber with normal branch pattern. Moderate atherosclerotic change about the arch and origin of the great vessels without hemodynamically significant stenosis. Visualized subclavian arteries patent. Right carotid system: Right common and internal carotid arteries are mildly tortuous but widely patent without significant stenosis, dissection or occlusion. Left carotid system: Left common and internal carotid arteries are mildly tortuous but widely  patent without significant stenosis, dissection or occlusion. Vertebral arteries: Both vertebral arteries arise from the subclavian arteries.  Focal plaque at the origin of the right vertebral artery with associated 50% stenosis. Focal non stenotic plaque noted at the origin of the left vertebral artery as well. Vertebral arteries tortuous but otherwise patent within the neck without stenosis, dissection, or occlusion. Skeleton: No acute osseous abnormality. No worrisome lytic or blastic osseous lesions. Moderate multilevel cervical spondylosis without significant stenosis. Other neck: No other acute soft tissue abnormality within the neck. No mass lesion or adenopathy. Upper chest: Mildly enlarged and partially calcified 12 mm precarinal lymph node noted. Patchy multifocal ground-glass opacities seen within the partially visualized lungs, similar to previous. Superimposed scattered subpleural reticular densities and fibrotic changes. Superimposed 9 mm nodular density at the posterior left upper lobe (series 5, image 169). Additional 9 mm pleural base nodular density partially visualized at the anterior right upper lobe (series 5, image 189). Review of the MIP images confirms the above findings CTA HEAD FINDINGS Anterior circulation: Scattered calcified plaque throughout the petrous, cavernous, and supraclinoid ICAs with associated moderate to severe multifocal narrowing. A1 segments patent. Normal anterior communicating artery. Anterior cerebral arteries widely patent to their distal aspects without stenosis. Left M1 patent. There is occlusive thrombus within the proximal left M2 branch, superior division. Attenuated collateral flow seen distally. Anterior temporal/inferior division remains patent. Right M1 widely patent. Normal right MCA bifurcation. Short-segment severe stenosis at the origin of the right M2 inferior division. Right MCA branches well perfused distally. Posterior circulation: Calcified atheromatous plaque within the dominant left V4 segment with associated moderate multifocal stenosis. Diminutive right vertebral artery widely patent the  vertebrobasilar junction. Posterior inferior cerebral arteries patent bilaterally. Basilar widely patent to its distal aspect. Superior cerebral arteries patent bilaterally. Both PCAs primarily supplied via the basilar. Short-segment severe distal left P2 stenosis noted. PCAs otherwise patent to their distal aspects without stenosis. Venous sinuses: Patent. Anatomic variants: None significant. Review of the MIP images confirms the above findings CT Brain Perfusion Findings: ASPECTS: 9 CBF (<30%) Volume: 83m Perfusion (Tmax>6.0s) volume: 559mMismatch Volume: 4469mnfarction Location:Patchy small volume acute core infarct seen involving the deep white matter of the left cerebral hemisphere, somewhat watershed in distribution. Surrounding ischemic penumbra of 55 cc, with mismatch volume of 44 cc. IMPRESSION: CTA HEAD AND NECK IMPRESSION: 1. Positive CTA for LVO with occlusive thrombus within a proximal left M2 branch, superior division. 2. Heavy calcified plaque throughout the carotid siphons with associated moderate to severe multifocal narrowing. 3. Short-segment severe proximal right M2 and distal left P2 stenoses. 4. Approximate 50% atheromatous stenoses at the origin of the right vertebral artery as well as involving the left V4 segment. CT PERFUSION IMPRESSION: 1. 11 cc acute core infarct involving the deep white matter of the left cerebral hemisphere, watershed in distribution. Surrounding 55 cc ischemic penumbra involving much of the left MCA. 2. Aspects equals 9 on prior head CT. These results were communicated to Dr. LinCheral Marker 11:22 pmon 1/2/2021by text page via the AMIKind[facility]ssaging system. Electronically Signed   By: BenJeannine BogaD.   On: 03/03/2019 23:44   CT Angio Chest PE W and/or Wo Contrast  Result Date: 03/02/2019 CLINICAL DATA:  Shortness of breath. EXAM: CT ANGIOGRAPHY CHEST WITH CONTRAST TECHNIQUE: Multidetector CT imaging of the chest was performed using the standard protocol during  bolus administration of intravenous contrast. Multiplanar CT image reconstructions and MIPs were obtained to evaluate the vascular anatomy. CONTRAST:  58m OMNIPAQUE IOHEXOL 350 MG/ML SOLN COMPARISON:  Radiograph yesterday. Chest CT 04/20/2016 had an outside institution. FINDINGS: Cardiovascular: There are no filling defects within the pulmonary arteries to suggest pulmonary embolus. Mild prominence of the left and right main pulmonary arteries measuring 2.7 and 2.8 cm respectively. Aortic atherosclerosis and tortuosity. No aortic aneurysm or dissection. Coronary artery calcifications. Normal heart size with slight right heart dilatation. Contrast refluxes into the hepatic veins and IVC. Mediastinum/Nodes: Calcified mediastinal and hilar lymph nodes. No noncalcified adenopathy. No esophageal wall thickening. No suspicious thyroid nodule. Lungs/Pleura: Bilateral subpleural reticulation in a basilar predominant distribution, similar to prior exam. Slight increased density within the dependent right greater than left lower lobe reticulation may represent pulmonary microlithiasis, this is also unchanged. No acute airspace disease. No pulmonary edema or pleural effusion. Trachea and mainstem bronchi are patent. No pulmonary mass. Upper Abdomen: Contrast refluxes into the hepatic veins and IVC. No other acute finding. Musculoskeletal: There are no acute or suspicious osseous abnormalities. Review of the MIP images confirms the above findings. IMPRESSION: 1. No pulmonary embolus. 2. Contrast reflux into the hepatic veins and IVC, suggesting elevated right heart pressures. 3. Unchanged chronic interstitial lung disease since 2018, likely pulmonary fibrosis/UIP. Aortic Atherosclerosis (ICD10-I70.0). Electronically Signed   By: MKeith RakeM.D.   On: 03/02/2019 03:06   IR CT Head Ltd  Result Date: 03/04/2019 INDICATION: 86year old male with a history of acute right-sided symptoms and identification left MCA emergent  large vessel occlusion EXAM: ULTRASOUND GUIDED ACCESS RIGHT COMMON FEMORAL ARTERY CERVICAL AND CEREBRAL ANGIOGRAM MECHANICAL THROMBECTOMY OF LEFT MCA DEPLOYMENT OF ANGIO-SEAL FOR HEMOSTASIS COMPARISON:  CT IMAGING SAME DAY MEDICATIONS: 1 g vancomycin. The antibiotic was administered within 1 hour of the procedure ANESTHESIA/SEDATION: The anesthesia team was present to provide general endotracheal tube anesthesia and for patient monitoring during the procedure. Interventional neuro radiology nursing staff was also present. The patient was also continuously cared for during the procedure by the interventional radiology nurse under my direct supervision. CONTRAST:  50 cc IV contrast FLUOROSCOPY TIME:  Fluoroscopy Time: 14 minutes 24 seconds (1,007 mGy). COMPLICATIONS: None immediate. TECHNIQUE: Informed written consent was obtained from the patient after a thorough discussion of the procedural risks, benefits and alternatives. All questions were addressed. Maximal Sterile Barrier Technique was utilized including caps, mask, sterile gowns, sterile gloves, sterile drape, hand hygiene and skin antiseptic. A timeout was performed prior to the initiation of the procedure. Informed written consent was obtained from the patient's family after a thorough discussion of the procedural risks, benefits and alternatives. Specific risks discussed include: Bleeding, infection, contrast reaction, kidney injury/failure, need for further procedure/surgery, arterial injury or dissection, embolization to new territory, intracranial hemorrhage (10-15% risk), neurologic deterioration, cardiopulmonary collapse, death. All questions were addressed. Maximal Sterile Barrier Technique was utilized including during the procedure including caps, mask, sterile gowns, sterile gloves, sterile drape, hand hygiene and skin antiseptic. A timeout was performed prior to the initiation of the procedure. The anesthesia team was present to provide general  endotracheal tube anesthesia and for patient monitoring during the procedure. Interventional neuro radiology nursing staff was also present. FINDINGS: Initial Findings: Left common carotid artery:  Normal course caliber and contour. Left external carotid artery: Patent with antegrade flow. Left internal carotid artery: Normal course caliber and contour of the cervical portion. Vertical and petrous segment patent with normal course caliber contour. Cavernous segment patent. Clinoid segment patent. Antegrade flow of the ophthalmic artery. Ophthalmic segment patent. Terminus patent. Left MCA:  Proximal  M1 segment patent Early temporal branch, supplying the left temporal lobe. Early angiographic image demonstrates significant leptomeningeal collateral flow into the MCA territory from the [facility] territory and the patent temporal branches. Distal M1 occlusion. Left ACA: A 1 segment patent. A 2 segment perfuses the right territory. Patent anterior communicating artery. Leptomeningeal collaterals from the [place] territory through the watershed ANCA territory. Completion Findings: Left MCA: Restoration of fluoro through the MCA after mechanical thrombectomy. TICI 3 flow restored Flat panel CT demonstrates no subarachnoid hemorrhage. PROCEDURE: Patient is brought emergently to the neuro angiography suite, with the patient identified appropriately and placed supine position on the table. Left radial arterial line was placed by the anesthesia team. The patient is then prepped and draped in the usual sterile fashion. Ultrasound survey of the right inguinal region was performed with images stored and sent to PACs. 11 blade scalpel was used to make a small incision. Blunt dissection was performed. A micropuncture needle was used access the right common femoral artery under ultrasound. With excellent arterial blood flow returned, an .018 micro wire was passed through the needle, observed to enter the abdominal aorta under fluoroscopy.  The needle was removed, and a micropuncture sheath was placed over the wire. The inner dilator and wire were removed, and an 035 Bentson wire was advanced under fluoroscopy into the abdominal aorta. The sheath was removed and a standard 5 [place] vascular sheath was placed. The dilator was removed and the sheath was flushed. A 89F JB-1 diagnostic catheter was advanced over the wire to the proximal descending thoracic aorta. Wire was then removed. Double flush of the catheter was performed. Catheter was then used to select the left common carotid artery. Formal angiogram was performed demonstrating somewhat tortuous common carotid artery. Standard Glidewire and the JB 1 catheter were used to navigate to the bifurcation. Wire was removed and angiogram was performed. The roadrunner wire was then used to navigate the JB 1 catheter to the petrous segment. Wire was removed. Exchange length Rosen wire was then passed through the diagnostic catheter to the petrous ICA and the diagnostic catheter was removed. The 5 French sheath was removed and exchanged for 8 French 55 centimeter BrightTip sheath. Sheath was flushed and attached to pressurized and heparinized saline bag for constant forward flow. At this point we attempted to pass the 95 cm [place] balloon guide catheter through the bright tip sheath. Multiple attempts using the introducer and without the introducer failed, and we ultimately decided that there was a mismatch, perhaps from quality control of the sheath. We then elected to use a flow gait balloon catheter. Eight French 95 cm flow gate balloon catheter was then advanced over the wire to the distal cervical segment. Wire was removed. Then a coaxial intermediate catheter and microcatheter combination was prepared on the back table. This combination was CAT-5 catheter and a Trevo TRK microcatheter, with a synchro soft wire. This combination was then advanced through the balloon guide into the ICA. System was  advanced into the internal carotid artery, to the level of the occlusion. The micro wire was then carefully advanced through the occluded segment. Microcatheter was then pushed through the occluded segment and the wire was removed. Intermediate catheter was advanced to the distal balloon guide. Blood was then aspirated through the hub of the microcatheter, and a gentle contrast injection was performed confirming intraluminal position. A rotating hemostatic valve was then attached to the back end of the microcatheter, and a pressurized and heparinized saline bag was  attached to the catheter. 90m x 3110mTrevo NXT device was then selected. Back flush was achieved at the rotating hemostatic valve, and then the device was gently advanced through the microcatheter to the distal end. The retriever was then unsheathed by withdrawing the microcatheter under fluoroscopy. Once the retriever was completely unsheathed, the microcatheter was carefully stripped from the delivery wire of the device. 3 minute time interval was observed. The balloon on the balloon guide was then inflated to profile of the vessel. Constant suction aspiration was then performed through the intermediate catheter, and constant gentle aspiration was performed at the balloon guide. This aspiration was continued as the retriever was gently and slowly withdrawn with fluoroscopic observation into the distal intermediate catheter. The entire system was then gently withdrawn from the intracranial ICA and into the balloon guide. Once the retriever was entirely removed from the system, free aspiration was confirmed at the hub of the balloon guide, with free blood return confirmed. Control angiogram was then performed, confirming restoration of flow. Balloon guide was withdrawn with angiogram performed of the cervical ICA. Balloon guide was removed, and the bright tip sheath was removed and a wire with Angio-Seal deployed. Pulses were confirmed bilateral lower  extremity. Patient tolerated the procedure well and remained hemodynamically stable throughout. No complications were encountered. Estimated blood loss approximately 75 cc. IMPRESSION: Status post ultrasound guided access right common femoral artery for left-sided cervical and cerebral angiogram and mechanical thrombectomy of left M1 occlusion via single pass of Trevo 3x32 NXT device. Angio-Seal deployed for hemostasis. Signed, JaDulcy FannyWaDellia NimsRPVI [facility]ists [facility] PL[facility]: Admit to ICU Patient was extubated in the room Blood pressure target of 16161ystolic Right hip straight times 8 hours, status post Angio-Seal deployment. Electronically Signed   By: JaCorrie Mckusick.O.   On: 03/04/2019 01:25   IR US[place]ore[place]ide Vasc Access Right  Result Date: 03/04/2019 INDICATION: [id number] old male with a history of acute right-sided symptoms and identification left MCA emergent large vessel occlusion EXAM: ULTRASOUND GUIDED ACCESS RIGHT COMMON FEMORAL ARTERY CERVICAL AND CEREBRAL ANGIOGRAM MECHANICAL THROMBECTOMY OF LEFT MCA DEPLOYMENT OF ANGIO-SEAL FOR HEMOSTASIS COMPARISON:  CT IMAGING SAME DAY MEDICATIONS: 1 g vancomycin. The antibiotic was administered within 1 hour of the procedure ANESTHESIA/SEDATION: The anesthesia team was present to provide general endotracheal tube anesthesia and for patient monitoring during the procedure. Interventional neuro radiology nursing staff was also present. The patient was also continuously cared for during the procedure by the interventional radiology nurse under my direct supervision. CONTRAST:  50 cc IV contrast FLUOROSCOPY TIME:  Fluoroscopy Time: 14 minutes 24 seconds (1,007 mGy). COMPLICATIONS: None immediate. TECHNIQUE: Informed written consent was obtained from the patient after a thorough discussion of the procedural risks, benefits and alternatives. All questions were addressed. Maximal Sterile Barrier Technique was utilized  including caps, mask, sterile gowns, sterile gloves, sterile drape, hand hygiene and skin antiseptic. A timeout was performed prior to the initiation of the procedure. Informed written consent was obtained from the patient's family after a thorough discussion of the procedural risks, benefits and alternatives. Specific risks discussed include: Bleeding, infection, contrast reaction, kidney injury/failure, need for further procedure/surgery, arterial injury or dissection, embolization to new territory, intracranial hemorrhage (10-15% risk), neurologic deterioration, cardiopulmonary collapse, death. All questions were addressed. Maximal Sterile Barrier Technique was utilized including during the procedure including caps, mask, sterile gowns, sterile gloves, sterile drape, hand hygiene and skin antiseptic. A timeout was performed prior to the initiation of  the procedure. The anesthesia team was present to provide general endotracheal tube anesthesia and for patient monitoring during the procedure. Interventional neuro radiology nursing staff was also present. FINDINGS: Initial Findings: Left common carotid artery:  Normal course caliber and contour. Left external carotid artery: Patent with antegrade flow. Left internal carotid artery: Normal course caliber and contour of the cervical portion. Vertical and petrous segment patent with normal course caliber contour. Cavernous segment patent. Clinoid segment patent. Antegrade flow of the ophthalmic artery. Ophthalmic segment patent. Terminus patent. Left MCA:  Proximal M1 segment patent Early temporal branch, supplying the left temporal lobe. Early angiographic image demonstrates significant leptomeningeal collateral flow into the MCA territory from the [facility] territory and the patent temporal branches. Distal M1 occlusion. Left ACA: A 1 segment patent. A 2 segment perfuses the right territory. Patent anterior communicating artery. Leptomeningeal collaterals from the [place]  territory through the watershed ANCA territory. Completion Findings: Left MCA: Restoration of fluoro through the MCA after mechanical thrombectomy. TICI 3 flow restored Flat panel CT demonstrates no subarachnoid hemorrhage. PROCEDURE: Patient is brought emergently to the neuro angiography suite, with the patient identified appropriately and placed supine position on the table. Left radial arterial line was placed by the anesthesia team. The patient is then prepped and draped in the usual sterile fashion. Ultrasound survey of the right inguinal region was performed with images stored and sent to PACs. 11 blade scalpel was used to make a small incision. Blunt dissection was performed. A micropuncture needle was used access the right common femoral artery under ultrasound. With excellent arterial blood flow returned, an .018 micro wire was passed through the needle, observed to enter the abdominal aorta under fluoroscopy. The needle was removed, and a micropuncture sheath was placed over the wire. The inner dilator and wire were removed, and an 035 Bentson wire was advanced under fluoroscopy into the abdominal aorta. The sheath was removed and a standard 5 [place] vascular sheath was placed. The dilator was removed and the sheath was flushed. A 16F JB-1 diagnostic catheter was advanced over the wire to the proximal descending thoracic aorta. Wire was then removed. Double flush of the catheter was performed. Catheter was then used to select the left common carotid artery. Formal angiogram was performed demonstrating somewhat tortuous common carotid artery. Standard Glidewire and the JB 1 catheter were used to navigate to the bifurcation. Wire was removed and angiogram was performed. The roadrunner wire was then used to navigate the JB 1 catheter to the petrous segment. Wire was removed. Exchange length Rosen wire was then passed through the diagnostic catheter to the petrous ICA and the diagnostic catheter was removed.  The 5 French sheath was removed and exchanged for 8 French 55 centimeter BrightTip sheath. Sheath was flushed and attached to pressurized and heparinized saline bag for constant forward flow. At this point we attempted to pass the 95 cm Walrus balloon guide catheter through the bright tip sheath. Multiple attempts using the introducer and without the introducer failed, and we ultimately decided that there was a mismatch, perhaps from quality control of the sheath. We then elected to use a flow gait balloon catheter. Eight French 95 cm flow gate balloon catheter was then advanced over the wire to the distal cervical segment. Wire was removed. Then a coaxial intermediate catheter and microcatheter combination was prepared on the back table. This combination was CAT-5 catheter and a Trevo TRK microcatheter, with a synchro soft wire. This combination was then advanced through the balloon  guide into the ICA. System was advanced into the internal carotid artery, to the level of the occlusion. The micro wire was then carefully advanced through the occluded segment. Microcatheter was then pushed through the occluded segment and the wire was removed. Intermediate catheter was advanced to the distal balloon guide. Blood was then aspirated through the hub of the microcatheter, and a gentle contrast injection was performed confirming intraluminal position. A rotating hemostatic valve was then attached to the back end of the microcatheter, and a pressurized and heparinized saline bag was attached to the catheter. 58m x 344mTrevo NXT device was then selected. Back flush was achieved at the rotating hemostatic valve, and then the device was gently advanced through the microcatheter to the distal end. The retriever was then unsheathed by withdrawing the microcatheter under fluoroscopy. Once the retriever was completely unsheathed, the microcatheter was carefully stripped from the delivery wire of the device. 3 minute time interval  was observed. The balloon on the balloon guide was then inflated to profile of the vessel. Constant suction aspiration was then performed through the intermediate catheter, and constant gentle aspiration was performed at the balloon guide. This aspiration was continued as the retriever was gently and slowly withdrawn with fluoroscopic observation into the distal intermediate catheter. The entire system was then gently withdrawn from the intracranial ICA and into the balloon guide. Once the retriever was entirely removed from the system, free aspiration was confirmed at the hub of the balloon guide, with free blood return confirmed. Control angiogram was then performed, confirming restoration of flow. Balloon guide was withdrawn with angiogram performed of the cervical ICA. Balloon guide was removed, and the bright tip sheath was removed and a wire with Angio-Seal deployed. Pulses were confirmed bilateral lower extremity. Patient tolerated the procedure well and remained hemodynamically stable throughout. No complications were encountered. Estimated blood loss approximately 75 cc. IMPRESSION: Status post ultrasound guided access right common femoral artery for left-sided cervical and cerebral angiogram and mechanical thrombectomy of left M1 occlusion via single pass of Trevo 3x32 NXT device. Angio-Seal deployed for hemostasis. Signed, JaDulcy FannyWaDellia NimsRPVI [facility] Inter[facility] Radiology Specialists [facility] PL[facility]: Admit to ICU Patient was extubated in the room Blood pressure target of 16726ystolic Right hip straight times 8 hours, status post Angio-Seal deployment. Electronically Signed   By: JaCorrie Mckusick.O.   On: 03/04/2019 01:25   CT Code Stroke Cerebral Perfusion with contrast  Result Date: 03/03/2019 CLINICAL DATA:  Initial evaluation for acute stroke. EXAM: CT ANGIOGRAPHY HEAD AND NECK CT PERFUSION BRAIN TECHNIQUE: Multidetector CT imaging of the head and neck was performed using the  standard protocol during bolus administration of intravenous contrast. Multiplanar CT image reconstructions and MIPs were obtained to evaluate the vascular anatomy. Carotid stenosis measurements (when applicable) are obtained utilizing NASCET criteria, using the distal internal carotid diameter as the denominator. Multiphase CT imaging of the brain was performed following IV bolus contrast injection. Subsequent parametric perfusion maps were calculated using RAPID software. CONTRAST:  1154mMNIPAQUE IOHEXOL 350 MG/ML SOLN COMPARISON:  Prior noncontrast head CT from earlier same day. FINDINGS: CTA NECK FINDINGS Aortic arch: Visualized aortic arch of normal caliber with normal branch pattern. Moderate atherosclerotic change about the arch and origin of the great vessels without hemodynamically significant stenosis. Visualized subclavian arteries patent. Right carotid system: Right common and internal carotid arteries are mildly tortuous but widely patent without significant stenosis, dissection or occlusion. Left carotid system: Left common and internal carotid  arteries are mildly tortuous but widely patent without significant stenosis, dissection or occlusion. Vertebral arteries: Both vertebral arteries arise from the subclavian arteries. Focal plaque at the origin of the right vertebral artery with associated 50% stenosis. Focal non stenotic plaque noted at the origin of the left vertebral artery as well. Vertebral arteries tortuous but otherwise patent within the neck without stenosis, dissection, or occlusion. Skeleton: No acute osseous abnormality. No worrisome lytic or blastic osseous lesions. Moderate multilevel cervical spondylosis without significant stenosis. Other neck: No other acute soft tissue abnormality within the neck. No mass lesion or adenopathy. Upper chest: Mildly enlarged and partially calcified 12 mm precarinal lymph node noted. Patchy multifocal ground-glass opacities seen within the partially  visualized lungs, similar to previous. Superimposed scattered subpleural reticular densities and fibrotic changes. Superimposed 9 mm nodular density at the posterior left upper lobe (series 5, image 169). Additional 9 mm pleural base nodular density partially visualized at the anterior right upper lobe (series 5, image 189). Review of the MIP images confirms the above findings CTA HEAD FINDINGS Anterior circulation: Scattered calcified plaque throughout the petrous, cavernous, and supraclinoid ICAs with associated moderate to severe multifocal narrowing. A1 segments patent. Normal anterior communicating artery. Anterior cerebral arteries widely patent to their distal aspects without stenosis. Left M1 patent. There is occlusive thrombus within the proximal left M2 branch, superior division. Attenuated collateral flow seen distally. Anterior temporal/inferior division remains patent. Right M1 widely patent. Normal right MCA bifurcation. Short-segment severe stenosis at the origin of the right M2 inferior division. Right MCA branches well perfused distally. Posterior circulation: Calcified atheromatous plaque within the dominant left V4 segment with associated moderate multifocal stenosis. Diminutive right vertebral artery widely patent the vertebrobasilar junction. Posterior inferior cerebral arteries patent bilaterally. Basilar widely patent to its distal aspect. Superior cerebral arteries patent bilaterally. Both PCAs primarily supplied via the basilar. Short-segment severe distal left P2 stenosis noted. PCAs otherwise patent to their distal aspects without stenosis. Venous sinuses: Patent. Anatomic variants: None significant. Review of the MIP images confirms the above findings CT Brain Perfusion Findings: ASPECTS: 9 CBF (<30%) Volume: 36m Perfusion (Tmax>6.0s) volume: 559mMismatch Volume: 4441mnfarction Location:Patchy small volume acute core infarct seen involving the deep white matter of the left cerebral  hemisphere, somewhat watershed in distribution. Surrounding ischemic penumbra of 55 cc, with mismatch volume of 44 cc. IMPRESSION: CTA HEAD AND NECK IMPRESSION: 1. Positive CTA for LVO with occlusive thrombus within a proximal left M2 branch, superior division. 2. Heavy calcified plaque throughout the carotid siphons with associated moderate to severe multifocal narrowing. 3. Short-segment severe proximal right M2 and distal left P2 stenoses. 4. Approximate 50% atheromatous stenoses at the origin of the right vertebral artery as well as involving the left V4 segment. CT PERFUSION IMPRESSION: 1. 11 cc acute core infarct involving the deep white matter of the left cerebral hemisphere, watershed in distribution. Surrounding 55 cc ischemic penumbra involving much of the left MCA. 2. Aspects equals 9 on prior head CT. These results were communicated to Dr. LinCheral Marker 11:22 pmon 1/2/2021by text page via the AM[facility] Tampassaging system. Electronically Signed   By: BenJeannine BogaD.   On: 03/03/2019 23:44   IR PERCUTANEOUS ART THROMBECTOMY/INFUSION INTRACRANIAL INC DIAG ANGIO  Result Date: 03/04/2019 INDICATION: [age]ar old male with a history of acute right-sided symptoms and identification left MCA emergent large vessel occlusion EXAM: ULTRASOUND GUIDED ACCESS RIGHT COMMON FEMORAL ARTERY CERVICAL AND CEREBRAL ANGIOGRAM MECHANICAL THROMBECTOMY OF LEFT MCA DEPLOYMENT OF ANGIO-SEAL FOR HEMOSTASIS  COMPARISON:  CT IMAGING SAME DAY MEDICATIONS: 1 g vancomycin. The antibiotic was administered within 1 hour of the procedure ANESTHESIA/SEDATION: The anesthesia team was present to provide general endotracheal tube anesthesia and for patient monitoring during the procedure. Interventional neuro radiology nursing staff was also present. The patient was also continuously cared for during the procedure by the interventional radiology nurse under my direct supervision. CONTRAST:  50 cc IV contrast FLUOROSCOPY TIME:  Fluoroscopy  Time: 14 minutes 24 seconds (1,007 mGy). COMPLICATIONS: None immediate. TECHNIQUE: Informed written consent was obtained from the patient after a thorough discussion of the procedural risks, benefits and alternatives. All questions were addressed. Maximal Sterile Barrier Technique was utilized including caps, mask, sterile gowns, sterile gloves, sterile drape, hand hygiene and skin antiseptic. A timeout was performed prior to the initiation of the procedure. Informed written consent was obtained from the patient's family after a thorough discussion of the procedural risks, benefits and alternatives. Specific risks discussed include: Bleeding, infection, contrast reaction, kidney injury/failure, need for further procedure/surgery, arterial injury or dissection, embolization to new territory, intracranial hemorrhage (10-15% risk), neurologic deterioration, cardiopulmonary collapse, death. All questions were addressed. Maximal Sterile Barrier Technique was utilized including during the procedure including caps, mask, sterile gowns, sterile gloves, sterile drape, hand hygiene and skin antiseptic. A timeout was performed prior to the initiation of the procedure. The anesthesia team was present to provide general endotracheal tube anesthesia and for patient monitoring during the procedure. Interventional neuro radiology nursing staff was also present. FINDINGS: Initial Findings: Left common carotid artery:  Normal course caliber and contour. Left external carotid artery: Patent with antegrade flow. Left internal carotid artery: Normal course caliber and contour of the cervical portion. Vertical and petrous segment patent with normal course caliber contour. Cavernous segment patent. Clinoid segment patent. Antegrade flow of the ophthalmic artery. Ophthalmic segment patent. Terminus patent. Left MCA:  Proximal M1 segment patent Early temporal branch, supplying the left temporal lobe. Early angiographic image demonstrates  significant leptomeningeal collateral flow into the MCA territory from the [facility] territory and the patent temporal branches. Distal M1 occlusion. Left ACA: A 1 segment patent. A 2 segment perfuses the right territory. Patent anterior communicating artery. Leptomeningeal collaterals from the [place] territory through the watershed ANCA territory. Completion Findings: Left MCA: Restoration of fluoro through the MCA after mechanical thrombectomy. TICI 3 flow restored Flat panel CT demonstrates no subarachnoid hemorrhage. PROCEDURE: Patient is brought emergently to the neuro angiography suite, with the patient identified appropriately and placed supine position on the table. Left radial arterial line was placed by the anesthesia team. The patient is then prepped and draped in the usual sterile fashion. Ultrasound survey of the right inguinal region was performed with images stored and sent to PACs. 11 blade scalpel was used to make a small incision. Blunt dissection was performed. A micropuncture needle was used access the right common femoral artery under ultrasound. With excellent arterial blood flow returned, an .018 micro wire was passed through the needle, observed to enter the abdominal aorta under fluoroscopy. The needle was removed, and a micropuncture sheath was placed over the wire. The inner dilator and wire were removed, and an 035 Bentson wire was advanced under fluoroscopy into the abdominal aorta. The sheath was removed and a standard 5 [place] vascular sheath was placed. The dilator was removed and the sheath was flushed. A 74F JB-1 diagnostic catheter was advanced over the wire to the proximal descending thoracic aorta. Wire was then removed. Double flush of the  catheter was performed. Catheter was then used to select the left common carotid artery. Formal angiogram was performed demonstrating somewhat tortuous common carotid artery. Standard Glidewire and the JB 1 catheter were used to navigate to the  bifurcation. Wire was removed and angiogram was performed. The roadrunner wire was then used to navigate the JB 1 catheter to the petrous segment. Wire was removed. Exchange length Rosen wire was then passed through the diagnostic catheter to the petrous ICA and the diagnostic catheter was removed. The 5 French sheath was removed and exchanged for 8 French 55 centimeter BrightTip sheath. Sheath was flushed and attached to pressurized and heparinized saline bag for constant forward flow. At this point we attempted to pass the 95 cm Walrus balloon guide catheter through the bright tip sheath. Multiple attempts using the introducer and without the introducer failed, and we ultimately decided that there was a mismatch, perhaps from quality control of the sheath. We then elected to use a flow gait balloon catheter. Eight French 95 cm flow gate balloon catheter was then advanced over the wire to the distal cervical segment. Wire was removed. Then a coaxial intermediate catheter and microcatheter combination was prepared on the back table. This combination was CAT-5 catheter and a Trevo TRK microcatheter, with a synchro soft wire. This combination was then advanced through the balloon guide into the ICA. System was advanced into the internal carotid artery, to the level of the occlusion. The micro wire was then carefully advanced through the occluded segment. Microcatheter was then pushed through the occluded segment and the wire was removed. Intermediate catheter was advanced to the distal balloon guide. Blood was then aspirated through the hub of the microcatheter, and a gentle contrast injection was performed confirming intraluminal position. A rotating hemostatic valve was then attached to the back end of the microcatheter, and a pressurized and heparinized saline bag was attached to the catheter. 29m x 345mTrevo NXT device was then selected. Back flush was achieved at the rotating hemostatic valve, and then the  device was gently advanced through the microcatheter to the distal end. The retriever was then unsheathed by withdrawing the microcatheter under fluoroscopy. Once the retriever was completely unsheathed, the microcatheter was carefully stripped from the delivery wire of the device. 3 minute time interval was observed. The balloon on the balloon guide was then inflated to profile of the vessel. Constant suction aspiration was then performed through the intermediate catheter, and constant gentle aspiration was performed at the balloon guide. This aspiration was continued as the retriever was gently and slowly withdrawn with fluoroscopic observation into the distal intermediate catheter. The entire system was then gently withdrawn from the intracranial ICA and into the balloon guide. Once the retriever was entirely removed from the system, free aspiration was confirmed at the hub of the balloon guide, with free blood return confirmed. Control angiogram was then performed, confirming restoration of flow. Balloon guide was withdrawn with angiogram performed of the cervical ICA. Balloon guide was removed, and the bright tip sheath was removed and a wire with Angio-Seal deployed. Pulses were confirmed bilateral lower extremity. Patient tolerated the procedure well and remained hemodynamically stable throughout. No complications were encountered. Estimated blood loss approximately 75 cc. IMPRESSION: Status post ultrasound guided access right common femoral artery for left-sided cervical and cerebral angiogram and mechanical thrombectomy of left M1 occlusion via single pass of Trevo 3x32 NXT device. Angio-Seal deployed for hemostasis. Signed, JaDulcy FannyWaDellia NimsRPPitmanascular and Interventional Radiology Specialists [facility]  Radiology PLAN: Admit to ICU Patient was extubated in the room Blood pressure target of 197 systolic Right hip straight times 8 hours, status post Angio-Seal deployment. Electronically Signed   By:  Corrie Mckusick D.O.   On: 03/04/2019 01:25   CT HEAD CODE STROKE WO CONTRAST`  Result Date: 03/03/2019 CLINICAL DATA:  Code stroke. Initial evaluation for acute stroke, facial droop. EXAM: CT HEAD WITHOUT CONTRAST TECHNIQUE: Contiguous axial images were obtained from the base of the skull through the vertex without intravenous contrast. COMPARISON:  None available. FINDINGS: Brain: Generalized age-related cerebral atrophy with moderate chronic microvascular ischemic disease. No acute intracranial hemorrhage. Subtle hypodensity seen involving the left caudate, concerning for possible evolving left MCA territory infarct. Gray-white matter differentiation otherwise grossly maintained without definite discernible evidence for evolving ischemia. No mass lesion or midline shift. No hydrocephalus. No extra-axial fluid collection. Vascular: Apparent hyperdensity noted involving the distal left M1 and/or proximal M2 branch (a series 6, image 45), concerning for possible LVO. Calcified atherosclerosis present at the skull base. Skull: Scalp soft tissues and calvarium within normal limits. Sinuses/Orbits: Globes and orbital soft tissues within normal limits. Sequelae of prior sinus surgery noted. Chronic mucosal thickening noted within the maxillary sinuses, ethmoidal air cells, and right sphenoid sinus. Mastoid air cells are clear. Other: None. ASPECTS [facility] Stroke Program Early CT Score) - Ganglionic level infarction (caudate, lentiform nuclei, internal capsule, insula, M1-M3 cortex): 6 - Supraganglionic infarction (M4-M6 cortex): 3 Total score (0-10 with 10 being normal): 9 IMPRESSION: 1. Asymmetric hyperdensity involving the distal left M1 and/or proximal M2 branch, concerning for LVO. Subtle hypodensity involving the left caudate concerning for evolving left MCA territory ischemia. No intracranial hemorrhage. 2. ASPECTS is 9. 3. Underlying age-related cerebral atrophy with moderate chronic small vessel ischemic  disease. These results were communicated to Dr. Cheral Marker At 10:58 pmon 1/2/2021by text page via the [facility] messaging system. Electronically Signed   By: Jeannine Boga M.D.   On: 03/03/2019 23:02    Labs:  CBC: Recent Labs    11/27/18 1339 03/01/19 1559 03/03/19 0339 03/03/19 2237 03/03/19 2247  WBC 9.2 12.2* 8.5 16.6*  --   HGB 14.4 14.9 12.9* 13.8 15.0  HCT 42.2 44.1 37.6* 41.3 44.0  PLT 378 595* 537* 677*  --     COAGS: Recent Labs    03/03/19 2237  INR 1.1  APTT 38*    BMP: Recent Labs    11/27/18 1339 03/01/19 1559 03/03/19 0339 03/03/19 2237 03/03/19 2247  NA 128* 131* 132* 131* 131*  K 3.9 4.1 3.8 3.8 3.8  CL 90* 92* 96* 97* 95*  CO2 33* 27 30 25   --   GLUCOSE 110* 120* 139* 167* 159*  BUN 16 15 14 19 23   CALCIUM 9.7 9.3 8.6* 9.2  --   CREATININE 1.17 0.85 0.78 1.17 1.10  GFRNONAA 54* >60 >60 54*  --   GFRAA >60 >60 >60 >60  --     LIVER FUNCTION TESTS: Recent Labs    07/27/18 1046 09/28/18 1344 11/27/18 1339 03/03/19 2237  BILITOT 1.0 0.8 1.2 1.0  AST 22 18 16 21   ALT 15 11 14 17   ALKPHOS 71 79 60 58  PROT 6.7 6.7 6.7 6.1*  ALBUMIN 4.5 4.6 4.3 3.5    Assessment and Plan:  History of acute CVA s/p emergent mechanical thrombectomy of left MCA M1 segment occlusion achieving a TICI 3 revascularization 03/03/2019 by Dr. Earleen Newport. Patient's condition improving- can move all extremities. Right groin  incision stable, distal pulses 2+ bilaterally. Further plans per neurology- appreciate and agree with management. Please call NIR with questions/concerns.   Electronically Signed: Earley Abide, PA-C 03/04/2019, 11:34 AM   I spent a total of 25 Minutes at the the patient's bedside AND on the patient's hospital floor or unit, greater than 50% of which was counseling/coordinating care for left MCA M1 segment occlusion s/p revascularization.

## 2019-03-04 NOTE — Code Documentation (Signed)
Responded to Code Stroke called on pt already in ED. Pt arrived in ED at 2223. At 2030 pt became aphasic with R sided paralysis. During transport to ED, pt's symptoms resolved completely. On arrival, at 2223, pt's symptoms were still resolved. At 2227, pt developed R facial droop and R sided weakness, Code stroke initiated at 2236. Pt transported to CT. While in CT, pt developed aphasia and dysarthria in addition to R facial droop and R leg drift. NIH-4, CBG-144, CT-negative, CTA-LVO, CTP-11 cc acute core infarct with 55 cc ischemic penumbra. TPA started at 2314. IR paged at 2314. Pt transported to IR at 2320. Foley catheter placed in IR.

## 2019-03-04 NOTE — Transfer of Care (Signed)
Immediate Anesthesia Transfer of Care Note  Patient: Jimmy Jimenez  Procedure(s) Performed: IR WITH ANESTHESIA (N/A )  Patient Location: PACU  Anesthesia Type:General  Level of Consciousness: awake, alert  and oriented  Airway & Oxygen Therapy: Patient Spontanous Breathing and Patient connected to face mask oxygen  Post-op Assessment: Report given to RN, Post -op Vital signs reviewed and stable and Patient moving all extremities X 4  Post vital signs: Reviewed and stable  Last Vitals:  Vitals Value Taken Time  BP 140/52 03/04/19 0129  Temp    Pulse 74 03/04/19 0132  Resp 16 03/04/19 0132  SpO2 99 % 03/04/19 0132  Vitals shown include unvalidated device data.  Last Pain:  Vitals:   03/03/19 2334  TempSrc: Temporal         Complications: No apparent anesthesia complications

## 2019-03-05 ENCOUNTER — Inpatient Hospital Stay (HOSPITAL_COMMUNITY): Payer: No Typology Code available for payment source

## 2019-03-05 DIAGNOSIS — I361 Nonrheumatic tricuspid (valve) insufficiency: Secondary | ICD-10-CM

## 2019-03-05 DIAGNOSIS — I351 Nonrheumatic aortic (valve) insufficiency: Secondary | ICD-10-CM

## 2019-03-05 DIAGNOSIS — I6300 Cerebral infarction due to thrombosis of unspecified precerebral artery: Secondary | ICD-10-CM

## 2019-03-05 LAB — ECHOCARDIOGRAM COMPLETE
Height: 68 in
Weight: 2208.13 oz

## 2019-03-05 MED ORDER — ATORVASTATIN CALCIUM 10 MG PO TABS
10.0000 mg | ORAL_TABLET | Freq: Every day | ORAL | Status: DC
  Administered 2019-03-05 – 2019-03-07 (×3): 10 mg via ORAL
  Filled 2019-03-05 (×3): qty 1

## 2019-03-05 MED ORDER — ENOXAPARIN SODIUM 40 MG/0.4ML ~~LOC~~ SOLN
40.0000 mg | Freq: Every day | SUBCUTANEOUS | Status: DC
  Administered 2019-03-05 – 2019-03-06 (×2): 40 mg via SUBCUTANEOUS
  Filled 2019-03-05 (×2): qty 0.4

## 2019-03-05 MED ORDER — CLOPIDOGREL BISULFATE 75 MG PO TABS
75.0000 mg | ORAL_TABLET | Freq: Every day | ORAL | Status: DC
  Administered 2019-03-05 – 2019-03-08 (×3): 75 mg via ORAL
  Filled 2019-03-05 (×3): qty 1

## 2019-03-05 MED ORDER — ASPIRIN EC 81 MG PO TBEC
81.0000 mg | DELAYED_RELEASE_TABLET | Freq: Every day | ORAL | Status: DC
  Administered 2019-03-05 – 2019-03-08 (×3): 81 mg via ORAL
  Filled 2019-03-05 (×3): qty 1

## 2019-03-05 NOTE — H&P (View-Only) (Signed)
STROKE TEAM PROGRESS NOTE   INTERVAL HISTORY Patient is sitting up in a bedside chair.  He states he is doing well and has made good recovery.  Blood pressure adequately controlled.  2D echo is unremarkable.  I spoke with the patient and wife at the bedside and answered questions.  Plan is to get TEE and loop recorder tomorrow.  OBJECTIVE Vitals:   03/05/19 0500 03/05/19 0600 03/05/19 0700 03/05/19 0800  BP: (!) 115/47 (!) 138/53 (!) 152/53 (!) 146/79  Pulse: 61 69 73 (!) 106  Resp: 16 16 20  (!) 30  Temp:      TempSrc:      SpO2: 94% 95% 96% (!) 89%  Weight:      Height:        CBC:  Recent Labs  Lab 03/03/19 2237 03/03/19 2247 03/04/19 1537  WBC 16.6*  --  14.8*  NEUTROABS 14.1*  --  11.3*  HGB 13.8 15.0 12.8*  HCT 41.3 44.0 37.6*  MCV 102.0*  --  100.5*  PLT 677*  --  591*    Basic Metabolic Panel:  Recent Labs  Lab 03/03/19 2237 03/03/19 2247 03/04/19 1537  NA 131* 131* 135  K 3.8 3.8 3.9  CL 97* 95* 102  CO2 25  --  26  GLUCOSE 167* 159* 148*  BUN 19 23 15   CREATININE 1.17 1.10 0.93  CALCIUM 9.2  --  8.4*    Lipid Panel:     Component Value Date/Time   CHOL 158 03/04/2019 0449   TRIG 64 03/04/2019 0449   HDL 58 03/04/2019 0449   CHOLHDL 2.7 03/04/2019 0449   VLDL 13 03/04/2019 0449   LDLCALC 87 03/04/2019 0449   HgbA1c:  Lab Results  Component Value Date   HGBA1C 4.9 03/04/2019   Urine Drug Screen: No results found for: LABOPIA, COCAINSCRNUR, LABBENZ, AMPHETMU, THCU, LABBARB  Alcohol Level No results found for: [facility]  IMAGING  CT HEAD CODE STROKE WO CONTRAST 03/03/2019 1. Asymmetric hyperdensity involving the distal left M1 and/or proximal M2 branch, concerning for LVO. Subtle hypodensity involving the left caudate concerning for evolving left MCA territory ischemia. No intracranial hemorrhage. 2. ASPECTS is 9.  3. Underlying age-related cerebral atrophy with moderate chronic small vessel ischemic disease.   CT Code Stroke CTA Head W/WO  contrast CT Code Stroke CTA Neck W/WO contrast 03/03/2019 1. Positive CTA for LVO with occlusive thrombus within a proximal left M2 branch, superior division.  2. Heavy calcified plaque throughout the carotid siphons with associated moderate to severe multifocal narrowing.  3. Short-segment severe proximal right M2 and distal left P2 stenoses.  4. Approximate 50% atheromatous stenoses at the origin of the right vertebral artery as well as involving the left V4 segment.   CT Code Stroke Cerebral Perfusion with contrast 03/03/2019 1. 11 cc acute core infarct involving the deep white matter of the left cerebral hemisphere, watershed in distribution. Surrounding 55 cc ischemic penumbra involving much of the left MCA.  2. Aspects equals 9 on prior head CT.   [place] 03/04/2019 Status post ultrasound guided access right common femoral artery for left-sided cervical and cerebral angiogram and mechanical thrombectomy of left M1 occlusion via single pass of Trevo 3x32 NXT device. Angio-Seal deployed for hemostasis.    MR BRAIN WO CONTRAST 03/04/2019 1. Acute ischemic left MCA territory infarcts primarily involving the left caudate and lentiform nuclei. No associated hemorrhage or mass effect. 2. No other acute intracranial abnormality. 3. Age-related cerebral  atrophy with mild chronic small vessel ischemic disease.   ECHOCARDIOGRAM COMPLETE 03/05/2019 1. Left ventricular ejection fraction, by visual estimation, is 65 to 70%. The left ventricle has normal function. There is moderately increased left ventricular hypertrophy.  2. Left ventricular diastolic parameters are consistent with Grade I diastolic dysfunction (impaired relaxation).  3. The left ventricle has no regional wall motion abnormalities.  4. Global right ventricle has normal systolic function.The right ventricular size is normal. No increase in right ventricular wall thickness.  5. Left atrial size was normal.  6. Right atrial size was  normal.  7. The mitral valve is normal in structure. Trivial mitral valve regurgitation. No evidence of mitral stenosis.  8. The tricuspid valve is normal in structure.  9. The aortic valve is tricuspid. Aortic valve regurgitation is mild. Mild aortic valve sclerosis without stenosis. 10. The pulmonic valve was normal in structure. Pulmonic valve regurgitation is not visualized. 11. Aortic dilatation noted. 12. There is mild dilatation of the ascending aorta measuring 40 mm. 13. Severely elevated pulmonary artery systolic pressure. 14. The atrial septum is grossly normal.   ECG - SR rate 84 BPM. LVH (See cardiology reading for complete details)   PHYSICAL EXAM    Pleasant elderly Caucasian male.  Not in distress. . Afebrile. Head is nontraumatic. Neck is supple without bruit.    Cardiac exam no murmur or gallop. Lungs are clear to auscultation. Distal pulses are well felt.  Neurological Exam : patient is awake alert, improved dysarthria, following simple commands, pupils equally round and reactive to light, blinks to threat bilaterally, extraocular movements intact, right facial droop, intact sensation on the face, hearing intact to voice, tongue midline, he has some right mild hemiparesis but he is intact to light touch in all extremities.  No ataxia or dysmetria finger-to-nose.  Gait deferred.   ASSESSMENT/PLAN [name] is a 86 y.o. 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 IV tPA Saturday 03/03/19 at 2315. IR - mechanical thrombectomy of left M1 occlusion  Stroke:  Left MCA infarcts s/p tpA and successful IR  - embolic - source unknown  Code Stroke CT Head - Asymmetric hyperdensity involving the distal left M1 and/or proximal M2 branch,  concerning for LVO. Subtle hypodensity involving the left caudate concerning for evolving left MCA territory ischemia. No intracranial hemorrhage. ASPECTS is 9. Underlying age-related cerebral atrophy with moderate chronic small vessel ischemic disease.   CTA H&N - Positive CTA for LVO with occlusive thrombus within a proximal left M2 branch, superior division. Heavy calcified plaque throughout the carotid siphons with associated moderate to severe multifocal narrowing. Short-segment severe proximal right M2 and distal left P2 stenoses. Approximate 50% atheromatous stenoses at the origin of the right vertebral artery as well as involving the left V4 segmen  CT Perfusion - 11 cc acute core infarct involving the deep white matter of the left cerebral hemisphere, watershed in distribution. Surrounding 55 cc ischemic penumbra involving much of the left MCA.   Cerebral angio L M1 occlusion w/ revascularization   MRI head -  L MCA infarcts (caudate head and lentiform nucleus). Small vessel disease. Atrophy.   2D Echo - EF 65-70%. No source of embolus  TEE pending    If TEE negative, a [place] electrophysiologist will  consult and consider placement of an implantable loop recorder to evaluate for atrial fibrillation as etiology of stroke. This has been explained to patient by Dr. Leonie Man and his is agreeable.   Sars Corona Virus 2 - negative  LDL - 87  HgbA1c - 4.8  VTE prophylaxis - SCDs. Add lovenox.  aspirin 81 mg daily prior to admission, now on No antithrombotic post tPA. Will add aspirin and plavix x 3 weeks then plavix alone   Therapy recommendations:  HH PT  Disposition:  Pending  Transfer to the floor  Hypertension  Home BP meds: Zestril, hydralazine, Amlodipine (listed allergy to norvasc not taking), lasix  Current BP meds:  lisinopril 40, hydralazine 50 q 8h . Long-term BP goal normotensive  Hyperlipidemia  Home Lipid lowering medication: none    LDL 104, goal < 70  start Lipitor 10 mg daily - will not use intensive statin given advanced age  Continue statin at discharge  Other Stroke Risk Factors  Advanced age  Chronic diastolic CHF on lasix. Decrease IVF to 50h  Other Active Problems  Leukocytosis - 16.6->14.8 (afebrile)   Hyponatremia, resolved - 131->135  Essential Thrombocytosis - 677->591 on hydroxyurea PTA    Mild Bradycardia, resolved  Mild acute blood loss anemia s/p IR 13.8->15->12.8  pulm fibrosis  pulm HTN  Hospital day # 1  I have personally obtained history,examined this patient, reviewed notes, independently viewed imaging studies, participated in medical decision making and plan of care.ROS completed by me personally and pertinent positives fully documented  I have made any additions or clarifications directly to the above note.  Continue to mobilize out of bed.  Therapy and rehab consults.  Check TEE and loop recorder.  Long discussion with patient and wife at the bedside and answered questions.  Transfer to neurology floor bed later today when bed available greater than 50% time during this 35-minute visit was spent on counseling and coordination of care about his embolic stroke and answering questions.  Antony Contras, MD Medical Director [place] Pager: [phone] 03/05/2019 5:02 PM   To contact Stroke Continuity provider, please refer to [email or web address] After hours, contact General Neurology

## 2019-03-05 NOTE — Evaluation (Signed)
Occupational Therapy Evaluation Patient Details Name: Jimmy Jimenez MRN: [id number] DOB: 03/13/1925 Today's Date: 03/05/2019    History of Present Illness Jimmy Jimenez is an 86 y.o. male who was recently discharged from [facility] on Saturday afternoon after having been admitted there for treatment of acute on chronic respiratory failure with hypoxia. He presented to the ED at [facility] on Saturday night after being noted to be suddenly aphasic with right sided weakness at home. CT showing L MCA M2 infarct and more in a watershed distribution.  PMH: HTN, osteoporosis   Clinical Impression   Pt PTA: living with spouse, independent, driving. Pt currently performing ADL tasks with minguardA to minA overall. Pt ambulating a household distance x2 times in room with minguardA and no AD. Pt's O2 remained >87% on RA. Pt with no SOB noted. Pt limited by fair activity tolerance. Pt would benefit from continued OT skilled services for ADL, mobility and safety in [place] setting. OT following acutely.    Follow Up Recommendations  Home health OT;Supervision/Assistance - 24 hour    Equipment Recommendations  None recommended by OT    Recommendations for Other Services       Precautions / Restrictions Precautions Precautions: Fall Precaution Comments: watch HR Restrictions Weight Bearing Restrictions: No      Mobility Bed Mobility Overal bed mobility: Needs Assistance Bed Mobility: Rolling;Sidelying to Sit Rolling: Supervision Sidelying to sit: Supervision       General bed mobility comments: slow without need for AD or head up.  Very little forward progress with each scoot.  Transfers Overall transfer level: Needs assistance   Transfers: Sit to/from Stand Sit to Stand: Min guard         General transfer comment: slow and guarded with extra time spent getting himself forward.    Balance Overall balance assessment: Needs assistance Sitting-balance support: No upper extremity supported Sitting  balance-Leahy Scale: Good       Standing balance-Leahy Scale: Fair                             ADL either performed or assessed with clinical judgement   ADL Overall ADL's : Needs assistance/impaired Eating/Feeding: Modified independent;Sitting   Grooming: Min guard;Standing   Upper Body Bathing: Min guard;Standing   Lower Body Bathing: Min guard;Sitting/lateral leans;Sit to/from stand;Cueing for safety   Upper Body Dressing : Set up;Sitting   Lower Body Dressing: Minimal assistance;Sitting/lateral leans;Sit to/from stand;Cueing for safety   Toilet Transfer: Min guard;Cueing for safety   Toileting- Clothing Manipulation and Hygiene: Min guard;Sitting/lateral lean;Cueing for safety       Functional mobility during ADLs: Min guard General ADL Comments: Pt limited by activity tolerance.     Vision Baseline Vision/History: Wears glasses Wears Glasses: At all times Patient Visual Report: No change from baseline Vision Assessment?: No apparent visual deficits     Perception     Praxis      Pertinent Vitals/Pain Pain Assessment: Faces Faces Pain Scale: No hurt Pain Intervention(s): Monitored during session     Hand Dominance     Extremity/Trunk Assessment Upper Extremity Assessment Upper Extremity Assessment: Defer to OT evaluation   Lower Extremity Assessment Lower Extremity Assessment: Generalized weakness   Cervical / Trunk Assessment Cervical / Trunk Assessment: Kyphotic   Communication Communication Communication: No difficulties   Cognition Arousal/Alertness: Awake/alert Behavior During Therapy: WFL for tasks assessed/performed Overall Cognitive Status: Within Functional Limits for tasks assessed  General Comments  O2 remained >87% on RA. Pt with no SOB noted.    Exercises     Shoulder Instructions      Home Living Family/patient expects to be discharged to:: Private  residence Living Arrangements: Spouse/significant other Available Help at Discharge: Family;Available 24 hours/day Type of Home: House Home Access: Stairs to enter [employer] of Steps: 2-3 Entrance Stairs-Rails: None Home Layout: One level     Bathroom Shower/Tub: Occupational psychologist: Standard     Home Equipment: Environmental consultant - 2 wheels;Grab bars - toilet;Grab bars - tub/shower          Prior Functioning/Environment Level of Independence: Independent        Comments: drive, run errands, but my wife does most errands.        OT Problem List: Decreased activity tolerance;Impaired balance (sitting and/or standing)      OT Treatment/Interventions: Self-care/ADL training;Therapeutic exercise;Energy conservation;Therapeutic activities;Balance training    OT Goals(Current goals can be found in the care plan section) Acute Rehab OT Goals Patient Stated Goal: home when ready OT Goal Formulation: With patient Time For Goal Achievement: 03/19/19 Potential to Achieve Goals: Good ADL Goals Pt Will Perform Lower Body Dressing: sit to/from stand;with modified independence Pt/caregiver will Perform Home Exercise Program: Increased strength;Both right and left upper extremity Additional ADL Goal #1: Pt will state 3 energy conservation strategies to apply at home.  OT Frequency: Min 2X/week   Barriers to D/C:            Co-evaluation              AM-PAC OT "6 Clicks" Daily Activity     Outcome Measure Help from another person eating meals?: None Help from another person taking care of personal grooming?: A Little Help from another person toileting, which includes using toliet, bedpan, or urinal?: A Little Help from another person bathing (including washing, rinsing, drying)?: A Little Help from another person to put on and taking off regular upper body clothing?: None Help from another person to put on and taking off regular lower body clothing?: A  Little 6 Click Score: 20   End of Session Equipment Utilized During Treatment: Gait belt Nurse Communication: Mobility status  Activity Tolerance: Patient tolerated treatment well Patient left: in chair;with call bell/phone within reach;with chair alarm set  OT Visit Diagnosis: Unsteadiness on feet (R26.81);Muscle weakness (generalized) (M62.81)                Time: [id number] OT Time Calculation (min): 25 min Charges:  OT General Charges $OT Visit: 1 Visit OT Evaluation $OT Eval Moderate Complexity: 1 Mod OT Treatments $Self Care/Home Management : 8-22 mins  Jefferey Pica OTR/L Acute Rehabilitation Services Pager: [phone] Office: [phone]   Jimmy Jimenez C 03/05/2019, 5:05 PM

## 2019-03-05 NOTE — Progress Notes (Signed)
STROKE TEAM PROGRESS NOTE   INTERVAL HISTORY Patient is sitting up in a bedside chair.  He states he is doing well and has made good recovery.  Blood pressure adequately controlled.  2D echo is unremarkable.  I spoke with the patient and wife at the bedside and answered questions.  Plan is to get TEE and loop recorder tomorrow.  OBJECTIVE Vitals:   03/05/19 0500 03/05/19 0600 03/05/19 0700 03/05/19 0800  BP: (!) 115/47 (!) 138/53 (!) 152/53 (!) 146/79  Pulse: 61 69 73 (!) 106  Resp: 16 16 20  (!) 30  Temp:      TempSrc:      SpO2: 94% 95% 96% (!) 89%  Weight:      Height:        CBC:  Recent Labs  Lab 03/03/19 2237 03/03/19 2247 03/04/19 1537  WBC 16.6*  --  14.8*  NEUTROABS 14.1*  --  11.3*  HGB 13.8 15.0 12.8*  HCT 41.3 44.0 37.6*  MCV 102.0*  --  100.5*  PLT 677*  --  591*    Basic Metabolic Panel:  Recent Labs  Lab 03/03/19 2237 03/03/19 2247 03/04/19 1537  NA 131* 131* 135  K 3.8 3.8 3.9  CL 97* 95* 102  CO2 25  --  26  GLUCOSE 167* 159* 148*  BUN 19 23 15   CREATININE 1.17 1.10 0.93  CALCIUM 9.2  --  8.4*    Lipid Panel:     Component Value Date/Time   CHOL 158 03/04/2019 0449   TRIG 64 03/04/2019 0449   HDL 58 03/04/2019 0449   CHOLHDL 2.7 03/04/2019 0449   VLDL 13 03/04/2019 0449   LDLCALC 87 03/04/2019 0449   HgbA1c:  Lab Results  Component Value Date   HGBA1C 4.9 03/04/2019   Urine Drug Screen: No results found for: LABOPIA, COCAINSCRNUR, LABBENZ, AMPHETMU, THCU, LABBARB  Alcohol Level No results found for: [facility]  IMAGING  CT HEAD CODE STROKE WO CONTRAST 03/03/2019 1. Asymmetric hyperdensity involving the distal left M1 and/or proximal M2 branch, concerning for LVO. Subtle hypodensity involving the left caudate concerning for evolving left MCA territory ischemia. No intracranial hemorrhage. 2. ASPECTS is 9.  3. Underlying age-related cerebral atrophy with moderate chronic small vessel ischemic disease.   CT Code Stroke CTA Head W/WO  contrast CT Code Stroke CTA Neck W/WO contrast 03/03/2019 1. Positive CTA for LVO with occlusive thrombus within a proximal left M2 branch, superior division.  2. Heavy calcified plaque throughout the carotid siphons with associated moderate to severe multifocal narrowing.  3. Short-segment severe proximal right M2 and distal left P2 stenoses.  4. Approximate 50% atheromatous stenoses at the origin of the right vertebral artery as well as involving the left V4 segment.   CT Code Stroke Cerebral Perfusion with contrast 03/03/2019 1. 11 cc acute core infarct involving the deep white matter of the left cerebral hemisphere, watershed in distribution. Surrounding 55 cc ischemic penumbra involving much of the left MCA.  2. Aspects equals 9 on prior head CT.   [place] 03/04/2019 Status post ultrasound guided access right common femoral artery for left-sided cervical and cerebral angiogram and mechanical thrombectomy of left M1 occlusion via single pass of Trevo 3x32 NXT device. Angio-Seal deployed for hemostasis.    MR BRAIN WO CONTRAST 03/04/2019 1. Acute ischemic left MCA territory infarcts primarily involving the left caudate and lentiform nuclei. No associated hemorrhage or mass effect. 2. No other acute intracranial abnormality. 3. Age-related cerebral  atrophy with mild chronic small vessel ischemic disease.   ECHOCARDIOGRAM COMPLETE 03/05/2019 1. Left ventricular ejection fraction, by visual estimation, is 65 to 70%. The left ventricle has normal function. There is moderately increased left ventricular hypertrophy.  2. Left ventricular diastolic parameters are consistent with Grade I diastolic dysfunction (impaired relaxation).  3. The left ventricle has no regional wall motion abnormalities.  4. Global right ventricle has normal systolic function.The right ventricular size is normal. No increase in right ventricular wall thickness.  5. Left atrial size was normal.  6. Right atrial size was  normal.  7. The mitral valve is normal in structure. Trivial mitral valve regurgitation. No evidence of mitral stenosis.  8. The tricuspid valve is normal in structure.  9. The aortic valve is tricuspid. Aortic valve regurgitation is mild. Mild aortic valve sclerosis without stenosis. 10. The pulmonic valve was normal in structure. Pulmonic valve regurgitation is not visualized. 11. Aortic dilatation noted. 12. There is mild dilatation of the ascending aorta measuring 40 mm. 13. Severely elevated pulmonary artery systolic pressure. 14. The atrial septum is grossly normal.   ECG - SR rate 84 BPM. LVH (See cardiology reading for complete details)   PHYSICAL EXAM    Pleasant elderly Caucasian male.  Not in distress. . Afebrile. Head is nontraumatic. Neck is supple without bruit.    Cardiac exam no murmur or gallop. Lungs are clear to auscultation. Distal pulses are well felt.  Neurological Exam : patient is awake alert, improved dysarthria, following simple commands, pupils equally round and reactive to light, blinks to threat bilaterally, extraocular movements intact, right facial droop, intact sensation on the face, hearing intact to voice, tongue midline, he has some right mild hemiparesis but he is intact to light touch in all extremities.  No ataxia or dysmetria finger-to-nose.  Gait deferred.   ASSESSMENT/PLAN Mr. Niam Chasteen is a 86 y.o. 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 IV tPA Saturday 03/03/19 at 2315. IR - mechanical thrombectomy of left M1 occlusion  Stroke:  Left MCA infarcts s/p tpA and successful IR  - embolic - source unknown  Code Stroke CT Head - Asymmetric hyperdensity involving the distal left M1 and/or proximal M2 branch,  concerning for LVO. Subtle hypodensity involving the left caudate concerning for evolving left MCA territory ischemia. No intracranial hemorrhage. ASPECTS is 9. Underlying age-related cerebral atrophy with moderate chronic small vessel ischemic disease.   CTA H&N - Positive CTA for LVO with occlusive thrombus within a proximal left M2 branch, superior division. Heavy calcified plaque throughout the carotid siphons with associated moderate to severe multifocal narrowing. Short-segment severe proximal right M2 and distal left P2 stenoses. Approximate 50% atheromatous stenoses at the origin of the right vertebral artery as well as involving the left V4 segmen  CT Perfusion - 11 cc acute core infarct involving the deep white matter of the left cerebral hemisphere, watershed in distribution. Surrounding 55 cc ischemic penumbra involving much of the left MCA.   Cerebral angio L M1 occlusion w/ revascularization   MRI head -  L MCA infarcts (caudate head and lentiform nucleus). Small vessel disease. Atrophy.   2D Echo - EF 65-70%. No source of embolus  TEE pending    If TEE negative, a [place] electrophysiologist will  consult and consider placement of an implantable loop recorder to evaluate for atrial fibrillation as etiology of stroke. This has been explained to patient by Dr. Leonie Man and his is agreeable.   Sars Corona Virus 2 - negative  LDL - 87  HgbA1c - 4.8  VTE prophylaxis - SCDs. Add lovenox.  aspirin 81 mg daily prior to admission, now on No antithrombotic post tPA. Will add aspirin and plavix x 3 weeks then plavix alone   Therapy recommendations:  HH PT  Disposition:  Pending  Transfer to the floor  Hypertension  Home BP meds: Zestril, hydralazine, Amlodipine (listed allergy to norvasc not taking), lasix  Current BP meds:  lisinopril 40, hydralazine 50 q 8h . Long-term BP goal normotensive  Hyperlipidemia  Home Lipid lowering medication: none    LDL 104, goal < 70  start Lipitor 10 mg daily - will not use intensive statin given advanced age  Continue statin at discharge  Other Stroke Risk Factors  Advanced age  Chronic diastolic CHF on lasix. Decrease IVF to 50h  Other Active Problems  Leukocytosis - 16.6->14.8 (afebrile)   Hyponatremia, resolved - 131->135  Essential Thrombocytosis - 677->591 on hydroxyurea PTA    Mild Bradycardia, resolved  Mild acute blood loss anemia s/p IR 13.8->15->12.8  pulm fibrosis  pulm HTN  Hospital day # 1  I have personally obtained history,examined this patient, reviewed notes, independently viewed imaging studies, participated in medical decision making and plan of care.ROS completed by me personally and pertinent positives fully documented  I have made any additions or clarifications directly to the above note.  Continue to mobilize out of bed.  Therapy and rehab consults.  Check TEE and loop recorder.  Long discussion with patient and wife at the bedside and answered questions.  Transfer to neurology floor bed later today when bed available greater than 50% time during this 35-minute visit was spent on counseling and coordination of care about his embolic stroke and answering questions.  Antony Contras, MD Medical Director [place] Pager: [phone] 03/05/2019 5:02 PM   To contact Stroke Continuity provider, please refer to [email or web address] After hours, contact General Neurology

## 2019-03-05 NOTE — Progress Notes (Signed)
Physical Therapy Treatment Patient Details Name: Jimmy Jimenez MRN: [id number] DOB: 03/13/1925 Today's Date: 03/05/2019    History of Present Illness Jimmy Jimenez is an 86 y.o. male who was recently discharged from [facility] on Saturday afternoon after having been admitted there for treatment of acute on chronic respiratory failure with hypoxia. He presented to the ED at [facility] on Saturday night after being noted to be suddenly aphasic with right sided weakness at home. CT showing L MCA M2 infarct and more in a watershed distribution.  PMH: HTN, osteoporosis    PT Comments    Pt alert, flat of affect, participative.  Emphasis on transitions, transfers, gait training, stairs and observation of sats/HR with activity.    Follow Up Recommendations  Home health PT;Supervision - Intermittent     Equipment Recommendations  None recommended by PT    Recommendations for Other Services       Precautions / Restrictions Precautions Precautions: Fall Precaution Comments: watch HR    Mobility  Bed Mobility Overal bed mobility: Needs Assistance Bed Mobility: Rolling;Sidelying to Sit Rolling: Supervision Sidelying to sit: Supervision       General bed mobility comments: slow without need for AD or head up.  Very little forward progress with each scoot.  Transfers Overall transfer level: Needs assistance   Transfers: Sit to/from Stand Sit to Stand: Min guard         General transfer comment: slow and guarded with extra time spent getting himself forward.  Ambulation/Gait Ambulation/Gait assistance: Min guard;Min assist Gait Distance (Feet): 70 Feet(then 48 feet and additional 70 feet back to the room) Assistive device: 1 person hand held assist;None Gait Pattern/deviations: Step-through pattern Gait velocity: slower and guarded Gait velocity interpretation: <1.8 ft/sec, indicate of risk for recurrent falls General Gait Details: steady, but guarded and fatigued after a certain  distance, needing to sit.  EHR climbing as high as 143 bpm with sats dropping to 91% on RA with activity.  After activity sats dropped down to 88% with shallow breathing.  sats rose quickly with cuing to breathe efficiently and more deeply.   Stairs Stairs: Yes Stairs assistance: Min assist;Min guard Stair Management: One rail Left;Step to pattern;Alternating pattern;Forwards Number of Stairs: 5 General stair comments: slow, guarded, but safe with rail and step to pattern.   Wheelchair Mobility    Modified Rankin (Stroke Patients Only) Modified Rankin (Stroke Patients Only) Pre-Morbid Rankin Score: No significant disability Modified Rankin: Moderate disability     Balance Overall balance assessment: Needs assistance Sitting-balance support: No upper extremity supported Sitting balance-Leahy Scale: Good       Standing balance-Leahy Scale: Fair                              Cognition Arousal/Alertness: Awake/alert Behavior During Therapy: WFL for tasks assessed/performed Overall Cognitive Status: Within Functional Limits for tasks assessed                                        Exercises      General Comments        Pertinent Vitals/Pain Pain Assessment: Faces Faces Pain Scale: No hurt Pain Intervention(s): Monitored during session    Home Living                      Prior Function  PT Goals (current goals can now be found in the care plan section) Acute Rehab PT Goals Patient Stated Goal: home when ready PT Goal Formulation: With patient Time For Goal Achievement: 03/11/19 Potential to Achieve Goals: Good Progress towards PT goals: Progressing toward goals    Frequency    Min 4X/week      PT Plan Current plan remains appropriate    Co-evaluation              AM-PAC PT "6 Clicks" Mobility   Outcome Measure  Help needed turning from your back to your side while in a flat bed without using  bedrails?: None Help needed moving from lying on your back to sitting on the side of a flat bed without using bedrails?: None Help needed moving to and from a bed to a chair (including a wheelchair)?: A Little Help needed standing up from a chair using your arms (e.g., wheelchair or bedside chair)?: A Little Help needed to walk in hospital room?: A Little Help needed climbing 3-5 steps with a railing? : A Little 6 Click Score: 20    End of Session   Activity Tolerance: Patient tolerated treatment well Patient left: in chair;with call bell/phone within reach;with family/visitor present Nurse Communication: Mobility status PT Visit Diagnosis: Other symptoms and signs involving the nervous system (R29.898);Muscle weakness (generalized) (M62.81)     Time: [id number] PT Time Calculation (min) (ACUTE ONLY): 24 min  Charges:  $Gait Training: 8-22 mins $Therapeutic Activity: 8-22 mins                     03/05/2019  Ginger Carne., PT Acute Rehabilitation Services [phone]  (pager) [phone]  (office)   Jimmy Jimenez 03/05/2019, 2:00 PM

## 2019-03-05 NOTE — Progress Notes (Signed)
  Echocardiogram 2D Echocardiogram has been performed.  Jannett Celestine 03/05/2019, 10:24 AM

## 2019-03-05 NOTE — Progress Notes (Signed)
    [facility] has been requested to perform a transesophageal echocardiogram on 03/07/19 with Dr. Acie Fredrickson for Stroke.  After careful review of history and examination, the risks and benefits of transesophageal echocardiogram have been explained including risks of esophageal damage, perforation (1:10,000 risk), bleeding, pharyngeal hematoma as well as other potential complications associated with conscious sedation including aspiration, arrhythmia, respiratory failure and death. Alternatives to treatment were discussed, questions were answered. Patient is willing to proceed. Labs and vital signs stable.   Reino Bellis, NP-C 03/05/2019 2:46 PM

## 2019-03-05 NOTE — Anesthesia Postprocedure Evaluation (Signed)
Anesthesia Post Note  Patient: Jimmy Jimenez  Procedure(s) Performed: IR WITH ANESTHESIA (N/A )     Patient location during evaluation: PACU Anesthesia Type: General Level of consciousness: awake and alert Pain management: pain level controlled Vital Signs Assessment: post-procedure vital signs reviewed and stable Respiratory status: spontaneous breathing, nonlabored ventilation, respiratory function stable and patient connected to nasal cannula oxygen Cardiovascular status: blood pressure returned to baseline and stable Postop Assessment: no apparent nausea or vomiting Anesthetic complications: no    Last Vitals:  Vitals:   03/05/19 0700 03/05/19 0800  BP: (!) 152/53 (!) 146/79  Pulse: 73 (!) 106  Resp: 20 (!) 30  Temp:    SpO2: 96% (!) 89%    Last Pain:  Vitals:   03/05/19 0800  TempSrc:   PainSc: 0-No pain                 Tareq Dwan S

## 2019-03-06 DIAGNOSIS — L8990 Pressure ulcer of unspecified site, unspecified stage: Secondary | ICD-10-CM | POA: Diagnosis present

## 2019-03-06 LAB — CBC
HCT: 34.4 % — ABNORMAL LOW (ref 39.0–52.0)
HCT: 35.7 % — ABNORMAL LOW (ref 39.0–52.0)
Hemoglobin: 11.4 g/dL — ABNORMAL LOW (ref 13.0–17.0)
Hemoglobin: 11.8 g/dL — ABNORMAL LOW (ref 13.0–17.0)
MCH: 33.7 pg (ref 26.0–34.0)
MCH: 34.2 pg — ABNORMAL HIGH (ref 26.0–34.0)
MCHC: 33.1 g/dL (ref 30.0–36.0)
MCHC: 33.1 g/dL (ref 30.0–36.0)
MCV: 102 fL — ABNORMAL HIGH (ref 80.0–100.0)
MCV: 103.3 fL — ABNORMAL HIGH (ref 80.0–100.0)
Platelets: 481 10*3/uL — ABNORMAL HIGH (ref 150–400)
Platelets: 491 10*3/uL — ABNORMAL HIGH (ref 150–400)
RBC: 3.33 MIL/uL — ABNORMAL LOW (ref 4.22–5.81)
RBC: 3.5 MIL/uL — ABNORMAL LOW (ref 4.22–5.81)
RDW: 11.9 % (ref 11.5–15.5)
RDW: 12 % (ref 11.5–15.5)
WBC: 12 10*3/uL — ABNORMAL HIGH (ref 4.0–10.5)
WBC: 13 10*3/uL — ABNORMAL HIGH (ref 4.0–10.5)
nRBC: 0 % (ref 0.0–0.2)
nRBC: 0 % (ref 0.0–0.2)

## 2019-03-06 MED ORDER — ENSURE ENLIVE PO LIQD: 237.0000 mL | Freq: Two times a day (BID) | ORAL | Status: DC

## 2019-03-06 MED ORDER — PANTOPRAZOLE SODIUM 40 MG PO TBEC
40.0000 mg | DELAYED_RELEASE_TABLET | Freq: Every day | ORAL | Status: DC
  Administered 2019-03-06 – 2019-03-08 (×2): 40 mg via ORAL
  Filled 2019-03-06 (×2): qty 1

## 2019-03-06 MED ORDER — HYDRALAZINE HCL 20 MG/ML IJ SOLN: 5.0000 mg | INTRAMUSCULAR | Status: DC | PRN

## 2019-03-06 MED ORDER — SODIUM CHLORIDE 0.9 % IV SOLN: INTRAVENOUS | Status: DC

## 2019-03-06 NOTE — Progress Notes (Signed)
Prior to taking pt. To bathroom, RN noticed bleeding on right groin site. Pooled blood in groin and on bed pad and gauze saturate, hematoma marked. Began holding pressure immediately. Magdalen Spatz MD and Leonel Ramsay MD notified. Continue to hold pressure, lay bed flat until morning, pressure dressing applied once hemostasis obtained per Deveshwar.

## 2019-03-06 NOTE — Progress Notes (Signed)
Initial Nutrition Assessment  DOCUMENTATION CODES:   Not applicable  INTERVENTION:   Ensure Enlive po BID, each supplement provides 350 kcal and 20 grams of protein  Encourage PO intake    NUTRITION DIAGNOSIS:   Increased nutrient needs related to wound healing as evidenced by estimated needs.  GOAL:   Patient will meet greater than or equal to 90% of their needs  MONITOR:   PO intake, Supplement acceptance  REASON FOR ASSESSMENT:   Malnutrition Screening Tool    ASSESSMENT:   Pt with PMH of CHF, pulm fibrosis, HTN, HLD admitted with L MCA s/p IR.   Pt discussed during ICU rounds and with RN.  Pt eating 100 % meals TEE planned tomorrow  Medications reviewed  Labs reviewed    NUTRITION - FOCUSED PHYSICAL EXAM:  Deferred  Diet Order:   Diet Order            Diet NPO time specified  Diet effective midnight        Diet Heart Room service appropriate? Yes; Fluid consistency: Thin  Diet effective now              EDUCATION NEEDS:   No education needs have been identified at this time  Skin:  Skin Assessment: Skin Integrity Issues: Skin Integrity Issues:: Stage II Stage II: buttocks  Last BM:  unknown  Height:   Ht Readings from Last 1 Encounters:  03/04/19 5\' 8"  (1.727 m)    Weight:   Wt Readings from Last 1 Encounters:  03/04/19 62.6 kg    Ideal Body Weight:  70 kg  BMI:  Body mass index is 20.98 kg/m.  Estimated Nutritional Needs:   Kcal:  1800-2000  Protein:  90-100 grams  Fluid:  > 1.8 L/day  Maylon Peppers RD, LDN, CNSC [phone] Pager [phone] After Hours Pager

## 2019-03-06 NOTE — Progress Notes (Signed)
STROKE TEAM PROGRESS NOTE   INTERVAL HISTORY Patient is sitting in a bedside chair.  He states he is doing well.  He has no complaints.  Vital signs are stable.  TEE cannot be done until tomorrow.  He has had therapy eval which recommend home therapy.  OBJECTIVE Vitals:   03/06/19 0700 03/06/19 0730 03/06/19 0800 03/06/19 0900  BP: (!) 164/60  121/83 137/61  Pulse: 78 81 84 76  Resp: 12 (!) 22 19 (!) 21  Temp:   98.2 F (36.8 C)   TempSrc:   Oral   SpO2: 97% 97% 96% 97%  Weight:      Height:        CBC:  Recent Labs  Lab 03/03/19 2237 03/04/19 1537 03/06/19 0301  WBC 16.6* 14.8* 12.0*  NEUTROABS 14.1* 11.3*  --   HGB 13.8 12.8* 11.4*  HCT 41.3 37.6* 34.4*  MCV 102.0* 100.5* 103.3*  PLT 677* 591* 481*    Basic Metabolic Panel:  Recent Labs  Lab 03/03/19 2237 03/03/19 2247 03/04/19 1537  NA 131* 131* 135  K 3.8 3.8 3.9  CL 97* 95* 102  CO2 25  --  26  GLUCOSE 167* 159* 148*  BUN 19 23 15   CREATININE 1.17 1.10 0.93  CALCIUM 9.2  --  8.4*    Lipid Panel:     Component Value Date/Time   CHOL 158 03/04/2019 0449   TRIG 64 03/04/2019 0449   HDL 58 03/04/2019 0449   CHOLHDL 2.7 03/04/2019 0449   VLDL 13 03/04/2019 0449   LDLCALC 87 03/04/2019 0449   HgbA1c:  Lab Results  Component Value Date   HGBA1C 4.9 03/04/2019   Urine Drug Screen: No results found for: LABOPIA, COCAINSCRNUR, LABBENZ, AMPHETMU, THCU, LABBARB  Alcohol Level No results found for: [facility]  IMAGING  CT HEAD CODE STROKE WO CONTRAST 03/03/2019 1. Asymmetric hyperdensity involving the distal left M1 and/or proximal M2 branch, concerning for LVO. Subtle hypodensity involving the left caudate concerning for evolving left MCA territory ischemia. No intracranial hemorrhage. 2. ASPECTS is 9.  3. Underlying age-related cerebral atrophy with moderate chronic small vessel ischemic disease.   CT Code Stroke CTA Head W/WO contrast CT Code Stroke CTA Neck W/WO contrast 03/03/2019 1. Positive CTA for LVO  with occlusive thrombus within a proximal left M2 branch, superior division.  2. Heavy calcified plaque throughout the carotid siphons with associated moderate to severe multifocal narrowing.  3. Short-segment severe proximal right M2 and distal left P2 stenoses.  4. Approximate 50% atheromatous stenoses at the origin of the right vertebral artery as well as involving the left V4 segment.   CT Code Stroke Cerebral Perfusion with contrast 03/03/2019 1. 11 cc acute core infarct involving the deep white matter of the left cerebral hemisphere, watershed in distribution. Surrounding 55 cc ischemic penumbra involving much of the left MCA.  2. Aspects equals 9 on prior head CT.   [place] 03/04/2019 Status post ultrasound guided access right common femoral artery for left-sided cervical and cerebral angiogram and mechanical thrombectomy of left M1 occlusion via single pass of Trevo 3x32 NXT device. Angio-Seal deployed for hemostasis.    MR BRAIN WO CONTRAST 03/04/2019 1. Acute ischemic left MCA territory infarcts primarily involving the left caudate and lentiform nuclei. No associated hemorrhage or mass effect. 2. No other acute intracranial abnormality. 3. Age-related cerebral atrophy with mild chronic small vessel ischemic disease.   ECHOCARDIOGRAM COMPLETE 03/05/2019 1. Left ventricular ejection fraction, by visual  estimation, is 65 to 70%. The left ventricle has normal function. There is moderately increased left ventricular hypertrophy.  2. Left ventricular diastolic parameters are consistent with Grade I diastolic dysfunction (impaired relaxation).  3. The left ventricle has no regional wall motion abnormalities.  4. Global right ventricle has normal systolic function.The right ventricular size is normal. No increase in right ventricular wall thickness.  5. Left atrial size was normal.  6. Right atrial size was normal.  7. The mitral valve is normal in structure. Trivial mitral valve regurgitation.  No evidence of mitral stenosis.  8. The tricuspid valve is normal in structure.  9. The aortic valve is tricuspid. Aortic valve regurgitation is mild. Mild aortic valve sclerosis without stenosis. 10. The pulmonic valve was normal in structure. Pulmonic valve regurgitation is not visualized. 11. Aortic dilatation noted. 12. There is mild dilatation of the ascending aorta measuring 40 mm. 13. Severely elevated pulmonary artery systolic pressure. 14. The atrial septum is grossly normal.   ECG - SR rate 84 BPM. LVH (See cardiology reading for complete details)   PHYSICAL EXAM    Pleasant elderly Caucasian male.  Not in distress. . Afebrile. Head is nontraumatic. Neck is supple without bruit.    Cardiac exam no murmur or gallop. Lungs are clear to auscultation. Distal pulses are well felt.  Neurological Exam : patient is awake alert, slight dysarthria, following simple commands, pupils equally round and reactive to light, blinks to threat bilaterally, extraocular movements intact, right facial droop, intact sensation on the face, hearing intact to voice, tongue midline, he has some right mild hemiparesis but he is intact to light touch in all extremities.  No ataxia or dysmetria finger-to-nose.  Gait deferred.   ASSESSMENT/PLAN [name] is a 86 y.o. 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 IV tPA Saturday 03/03/19 at 2315. IR - mechanical thrombectomy of left M1 occlusion  Stroke:  Left MCA infarcts s/p tpA and successful IR  - embolic - source unknown  Code Stroke CT Head - Asymmetric hyperdensity involving the distal left M1 and/or proximal M2 branch, concerning for LVO. Subtle hypodensity involving the left caudate concerning for evolving  left MCA territory ischemia. No intracranial hemorrhage. ASPECTS is 9. Underlying age-related cerebral atrophy with moderate chronic small vessel ischemic disease.   CTA H&N - Positive CTA for LVO with occlusive thrombus within a proximal left M2 branch, superior division. Heavy calcified plaque throughout the carotid siphons with associated moderate to severe multifocal narrowing. Short-segment severe proximal right M2 and distal left P2 stenoses. Approximate 50% atheromatous stenoses at the origin of the right vertebral artery as well as involving the left V4 segmen  CT Perfusion - 11 cc acute core infarct involving the deep white matter of the left cerebral hemisphere, watershed in distribution. Surrounding 55 cc ischemic penumbra involving much of the left MCA.   Cerebral angio L M1 occlusion w/ revascularization   MRI head -  L MCA infarcts (caudate head and lentiform nucleus). Small vessel disease. Atrophy.   2D Echo - EF 65-70%. No source of embolus  TEE pending  Wed  If TEE negative, a [facility] electrophysiologist will consult and consider placement of an implantable loop recorder to evaluate for atrial fibrillation as etiology of stroke. This has  been explained to patient by Dr. Leonie Man and his is agreeable.   Sars Corona Virus 2 - negative  LDL - 87  HgbA1c - 4.8  VTE prophylaxis - SCDs. Add lovenox.  aspirin 81 mg daily prior to admission, now on aspirin 81 mg daily and clopidogrel 75 mg daily 3 weeks then plavix alone   Therapy recommendations:  HH PT, HH OT  Disposition:  Pending  Await transfer to the floor, tele  Hypertension  Home BP meds: Zestril, hydralazine, Amlodipine (listed allergy to norvasc not taking), lasix  Current BP meds:  lisinopril 40, hydralazine 50 q 8h . Long-term BP goal normotensive  Hyperlipidemia  Home Lipid lowering medication: none   LDL 104, goal < 70  start Lipitor 10 mg daily - will not use intensive  statin given advanced age  Continue statin at discharge  Other Stroke Risk Factors  Advanced age  Chronic diastolic CHF on lasix. Decrease IVF to 50h    Other Active Problems  Leukocytosis - 16.6->14.8->12 (afebrile)   Hyponatremia, resolved - 131->135  Essential Thrombocytosis - 677->591->481 on hydroxyurea PTA    Mild Bradycardia, resolved  Mild acute blood loss anemia s/p IR 13.8->15->12.8->11.4   pulm fibrosis  pulm HTN  Hospital day # 2  Continue ongoing therapies.  Mobilize out of bed.  TEE and loop recorder scheduled for tomorrow.  Likely discharge home after the procedures tomorrow.  Transfer to neurology floor bed when available.  Greater than 50% time during this 35-minute visit was spent on counseling and coordination of care and discussion with care team.  No family available at bedside today for discussion  Antony Contras, MD Medical Director [facility] Pager: [phone] 03/06/2019 10:19 AM  To contact Stroke Continuity provider, please refer to [email or web address] After hours, contact General Neurology

## 2019-03-06 NOTE — Progress Notes (Signed)
Dr. Leonie Man change SBP < 180.  He also said MIVF can be discontiued.

## 2019-03-06 NOTE — Progress Notes (Signed)
Physical Therapy Treatment Patient Details Name: Jimmy Jimenez MRN: [id number] DOB: 03/13/1925 Today's Date: 03/06/2019    History of Present Illness Jimmy Jimenez is an 86 y.o. male who was recently discharged from [facility] on Saturday afternoon after having been admitted there for treatment of acute on chronic respiratory failure with hypoxia. He presented to the ED at [facility] on Saturday night after being noted to be suddenly aphasic with right sided weakness at home. CT showing L MCA M2 infarct and more in a watershed distribution.  PMH: HTN, osteoporosis    PT Comments    Moving slowly, steadily, but guardedly.  Emphasis on gait stability/stamina.    Follow Up Recommendations  Home health PT;Supervision - Intermittent     Equipment Recommendations  None recommended by PT    Recommendations for Other Services       Precautions / Restrictions Precautions Precautions: Fall    Mobility  Bed Mobility Overal bed mobility: Needs Assistance       Supine to sit: Supervision Sit to supine: Supervision   General bed mobility comments: slow without need for AD or head up.  Very little forward progress with each scoot.  Worked on improved scooting technique  Transfers Overall transfer level: Needs assistance   Transfers: Sit to/from Stand Sit to Stand: Min guard         General transfer comment: slow and guarded with extra time spent getting himself forward.  Ambulation/Gait Ambulation/Gait assistance: Min guard Gait Distance (Feet): 120 Feet Assistive device: None Gait Pattern/deviations: Step-through pattern Gait velocity: slower and guarded   General Gait Details: steady, guarded, flexed posture, pt fatigued after a short distance./   Stairs             Wheelchair Mobility    Modified Rankin (Stroke Patients Only) Modified Rankin (Stroke Patients Only) Pre-Morbid Rankin Score: No significant disability Modified Rankin: Moderate disability     Balance  Overall balance assessment: Needs assistance Sitting-balance support: No upper extremity supported Sitting balance-Leahy Scale: Good       Standing balance-Leahy Scale: Fair                              Cognition Arousal/Alertness: Awake/alert Behavior During Therapy: WFL for tasks assessed/performed;Flat affect Overall Cognitive Status: Within Functional Limits for tasks assessed                                        Exercises      General Comments General comments (skin integrity, edema, etc.): sats maintain at low 90's, but did not stay up as long as other treatment session.      Pertinent Vitals/Pain Pain Assessment: Faces Faces Pain Scale: No hurt Pain Intervention(s): Monitored during session    Home Living                      Prior Function            PT Goals (current goals can now be found in the care plan section) Acute Rehab PT Goals Patient Stated Goal: home when ready PT Goal Formulation: With patient Time For Goal Achievement: 03/11/19 Potential to Achieve Goals: Good Progress towards PT goals: Progressing toward goals    Frequency    Min 4X/week      PT Plan Current plan remains appropriate    Co-evaluation  AM-PAC PT "6 Clicks" Mobility   Outcome Measure  Help needed turning from your back to your side while in a flat bed without using bedrails?: None Help needed moving from lying on your back to sitting on the side of a flat bed without using bedrails?: None Help needed moving to and from a bed to a chair (including a wheelchair)?: A Little Help needed standing up from a chair using your arms (e.g., wheelchair or bedside chair)?: A Little Help needed to walk in hospital room?: A Little Help needed climbing 3-5 steps with a railing? : A Little 6 Click Score: 20    End of Session   Activity Tolerance: Patient tolerated treatment well Patient left: in bed;with call bell/phone  within reach;with bed alarm set Nurse Communication: Mobility status PT Visit Diagnosis: Other symptoms and signs involving the nervous system (R29.898);Muscle weakness (generalized) (M62.81)     Time: [id number] PT Time Calculation (min) (ACUTE ONLY): 10 min  Charges:  $Gait Training: 8-22 mins                     03/06/2019  Ginger Carne., PT Acute Rehabilitation Services [phone]  (pager) [phone]  (office)   [name] [place] Alia 03/06/2019, 5:16 PM

## 2019-03-07 ENCOUNTER — Encounter (HOSPITAL_COMMUNITY)
Admission: EM | Disposition: A | Discharge status: Home Health | Payer: Self-pay | Source: Home / Self Care | Attending: Neurology

## 2019-03-07 ENCOUNTER — Inpatient Hospital Stay (HOSPITAL_COMMUNITY): Payer: No Typology Code available for payment source | Admitting: Certified Registered"

## 2019-03-07 ENCOUNTER — Inpatient Hospital Stay (HOSPITAL_COMMUNITY): Admit: 2019-03-07 | Payer: PPO

## 2019-03-07 ENCOUNTER — Inpatient Hospital Stay (HOSPITAL_COMMUNITY): Payer: No Typology Code available for payment source

## 2019-03-07 DIAGNOSIS — I6389 Other cerebral infarction: Secondary | ICD-10-CM

## 2019-03-07 DIAGNOSIS — I639 Cerebral infarction, unspecified: Secondary | ICD-10-CM

## 2019-03-07 HISTORY — PX: IR US GUIDE BX ASP/DRAIN: IMG2392

## 2019-03-07 HISTORY — PX: LOOP RECORDER INSERTION: EP1214

## 2019-03-07 LAB — CULTURE, BLOOD (ROUTINE X 2)
Culture: NO GROWTH
Culture: NO GROWTH
Special Requests: ADEQUATE
Special Requests: ADEQUATE

## 2019-03-07 LAB — BASIC METABOLIC PANEL
Anion gap: 7 (ref 5–15)
BUN: 19 mg/dL (ref 8–23)
CO2: 29 mmol/L (ref 22–32)
Calcium: 8.4 mg/dL — ABNORMAL LOW (ref 8.9–10.3)
Chloride: 94 mmol/L — ABNORMAL LOW (ref 98–111)
Creatinine, Ser: 1.07 mg/dL (ref 0.61–1.24)
GFR calc Af Amer: >60 mL/min (ref >60)
GFR calc non Af Amer: >60 mL/min (ref >60)
Glucose, Bld: 127 mg/dL — ABNORMAL HIGH (ref 70–99)
Potassium: 3.9 mmol/L (ref 3.5–5.1)
Sodium: 130 mmol/L — ABNORMAL LOW (ref 135–145)

## 2019-03-07 SURGERY — LOOP RECORDER INSERTION

## 2019-03-07 SURGERY — ECHOCARDIOGRAM, TRANSESOPHAGEAL
Anesthesia: Monitor Anesthesia Care

## 2019-03-07 MED ORDER — LIDOCAINE-EPINEPHRINE 1 %-1:100000 IJ SOLN
INTRAMUSCULAR | Status: DC | PRN
  Administered 2019-03-07: 5 mL via INTRADERMAL

## 2019-03-07 MED ORDER — THROMBIN FOR PERCUTANEOUS TREATMENT OF PSEUDOANEURYSM (5000UNITS/10ML)
Freq: Once | PERCUTANEOUS | Status: AC
  Filled 2019-03-07: qty 1

## 2019-03-07 MED ORDER — THROMBIN FOR PERCUTANEOUS TREATMENT OF PSEUDOANEURYSM (5000UNITS/10ML)
PERCUTANEOUS | Status: AC | PRN
  Administered 2019-03-07: 200 [IU] via PERCUTANEOUS

## 2019-03-07 MED ORDER — LIDOCAINE-EPINEPHRINE 1 %-1:100000 IJ SOLN
INTRAMUSCULAR | Status: AC
  Filled 2019-03-07: qty 1

## 2019-03-07 MED ORDER — LIDOCAINE HCL 1 % IJ SOLN
INTRAMUSCULAR | Status: AC
  Filled 2019-03-07: qty 20

## 2019-03-07 SURGICAL SUPPLY — 2 items
MONITOR REVEAL LINQ II (Prosthesis & Implant Heart) ×2 IMPLANT
PACK LOOP INSERTION (CUSTOM PROCEDURE TRAY) ×3 IMPLANT

## 2019-03-07 NOTE — Evaluation (Signed)
Speech Language Pathology Evaluation Patient Details Name: Jimmy Jimenez MRN: [id number] DOB: 03/13/1925 Today's Date: 03/07/2019 Time: 1100-1145 SLP Time Calculation (min) (ACUTE ONLY): 45 min  Problem List:  Patient Active Problem List   Diagnosis Date Noted  . Pressure injury of skin 03/06/2019  . Stroke (cerebrum) ([place]) 03/04/2019  . GERD (gastroesophageal reflux disease) 03/02/2019  . Acute respiratory failure with hypoxia ([place]) 03/02/2019  . Chronic diastolic CHF (congestive heart failure) ([place]) 03/02/2019  . Elevated troponin 03/02/2019  . Sacral decubitus ulcer, stage II ([place]) 02/19/2019  . Cough present for greater than 3 weeks 10/27/2018  . Chest pain 09/30/2018  . Rhinitis, chronic 11/20/2017  . Mass of pancreas 04/23/2016  . B12 deficiency 08/17/2015  . Testicular insufficiency 08/06/2015  . Low serum testosterone 05/20/2015  . Chronic constipation 03/31/2015  . Pulmonary fibrosis ([place]) 12/12/2014  . Rectal bleeding 12/12/2014  . Abnormal weight loss 08/20/2014  . Xerosis of skin 12/18/2013  . Medicare annual wellness visit, subsequent 09/28/2013  . Statin intolerance 09/26/2013  . Pulmonary hypertension, mild ([place]) 12/14/2012  . Rhinitis, nonallergic 12/14/2012  . Solitary pulmonary nodule 10/16/2012  . Heart failure, diastolic, due to HTN ([place]) 10/16/2012  . Bradycardia, sinus 09/02/2012  . Hyponatremia 12/01/2011  . Edema of both legs 10/30/2011  . Essential thrombocytosis ([place])   . Hyperlipidemia   . Essential hypertension   . Osteoporosis    Past Medical History:  Past Medical History:  Diagnosis Date  . Colon polyps   . Essential thrombocytosis ([place])   . Essential thrombocytosis ([place])   . Hyperlipidemia   . Hypertension   . Lung nodule 12/2004   found on CXR  . Osteoporosis    Past Surgical History:  Past Surgical History:  Procedure Laterality Date  . BONE MARROW BIOPSY  01/18/07  . COLONOSCOPY  2007, 2010   Dr Allen Norris  . IR CT HEAD LTD   03/04/2019  . IR PERCUTANEOUS ART THROMBECTOMY/INFUSION INTRACRANIAL INC DIAG ANGIO  03/04/2019  . IR US GUIDE VASC ACCESS RIGHT  03/04/2019  . NASAL SINUS SURGERY  06/11/05  . RADIOLOGY WITH ANESTHESIA N/A 03/03/2019   Procedure: IR WITH ANESTHESIA;  Surgeon: Luanne Bras, MD;  Location: [place];  Service: Radiology;  Laterality: N/A;   HPI:  86yo male admitted 03/04/19 with acute onset right hemiplegia, right facial droop, aphasia. PMH: recent admission for respiratory failure, thrombocytosis, HLD, HTN, lung nodule, osteoporosis. MRI = Acute ischemic left MCA territory infarcts primarily involving the left caudate and lentiform nuclei   Assessment / Plan / Recommendation Clinical Impression  Pt presents with mild receptive and expressive language deficits, which pt and wife report are at or near pt baseline. These deficits may have more to do with advanced age and limited education (9th grade per pt). Receptively, pt is able to answer simple yes/no questions, but is 70% accurate with more complex/abstract yes no questions. Pt is able to follow 1-step commands consistently, but has difficulty with 2+ step commands. Repetition and gestures facilitate comprehension. Expressively, pt has minimal difficulty with automatic sequences, omitting "Sunday" and "December" when listing days and months. No difficulty with counting. Pt repeats sentence length material, completes sentences, names objects, and answers responsive naming without difficulty. Pt exhibits deficits in higher level naming tasks such as word association, defining homonyms, and category member naming. Pt feels he is able to return to previous level of function at home, and does not endorse difficulty understanding staff or expressing his wants and needs. Pt and wife  were encouraged to notify PCP if difficulties arise once pt returns to his normal routines, however, no further ST intervention is recommended at this time.    SLP Assessment  SLP  Recommendation/Assessment: All further Speech Language Pathology needs can be addressed in the next venue of care SLP Visit Diagnosis: Aphasia (R47.01)    Follow Up Recommendations  Other (comment)(continued ST intervention if difficulties arise)       SLP Evaluation Cognition  Overall Cognitive Status: Within Functional Limits for tasks assessed       Comprehension  Auditory Comprehension Overall Auditory Comprehension: Impaired Yes/No Questions: Impaired Basic Biographical Questions: 76-100% accurate Basic Immediate Environment Questions: 75-100% accurate Complex Questions: 50-74% accurate Commands: Impaired One Step Basic Commands: 75-100% accurate Two Step Basic Commands: 75-100% accurate Multistep Basic Commands: 50-74% accurate Conversation: Simple EffectiveTechniques: Repetition;Extra processing time;Visual/Gestural cues Visual Recognition/Discrimination Discrimination: Not tested Reading Comprehension Reading Status: Not tested    Expression Expression Primary Mode of Expression: Verbal Verbal Expression Overall Verbal Expression: Impaired Initiation: No impairment Automatic Speech: Name;Social Response;Counting;Day of week;Month of year Level of Generative/Spontaneous Verbalization: Phrase Repetition: No impairment Naming: Impairment Responsive: 76-100% accurate Confrontation: Impaired Convergent: 50-74% accurate Divergent: 50-74% accurate Other Naming Comments: higher level naming issues noted, such as word association, providing differing definitions of homonyms, and higher level categorization tasks. Pragmatics: Impairment Impairments: Monotone;Abnormal affect Non-Verbal Means of Communication: Not applicable Written Expression Dominant Hand: Right Written Expression: Not tested   Oral / Motor  Oral Motor/Sensory Function Overall Oral Motor/Sensory Function: Mild impairment   GO                   Enriqueta Shutter, [place], [place] Speech Language  Pathologist Office: [phone] Pager: [phone]  Shonna Chock 03/07/2019, 4:09 PM

## 2019-03-07 NOTE — Anesthesia Preprocedure Evaluation (Deleted)
Anesthesia Evaluation  Patient identified by MRN, date of birth, ID band Patient awake    Reviewed: Allergy & Precautions, NPO status , Patient's Chart, lab work & pertinent test results  Airway        Dental   Pulmonary           Cardiovascular hypertension,      Neuro/Psych    GI/Hepatic   Endo/Other    Renal/GU      Musculoskeletal   Abdominal   Peds  Hematology   Anesthesia Other Findings   Reproductive/Obstetrics                             Anesthesia Physical Anesthesia Plan  ASA: III  Anesthesia Plan:    Post-op Pain Management:    Induction: Intravenous  PONV Risk Score and Plan:   Airway Management Planned: Natural Airway and Nasal Cannula  Additional Equipment:   Intra-op Plan:   Post-operative Plan:   Informed Consent: I have reviewed the patients History and Physical, chart, labs and discussed the procedure including the risks, benefits and alternatives for the proposed anesthesia with the patient or authorized representative who has indicated his/her understanding and acceptance.       Plan Discussed with: CRNA and Anesthesiologist  Anesthesia Plan Comments:         Anesthesia Quick Evaluation

## 2019-03-07 NOTE — Consult Note (Addendum)
ELECTROPHYSIOLOGY CONSULT NOTE  Patient ID: Jimmy Jimenez MRN: [id number], DOB/AGE: 84/01/1926   Admit date: 03/03/2019 Date of Consult: 03/07/2019  Primary Physician: Crecencio Mc, MD Primary Cardiologist: No primary care provider on file.  Primary Electrophysiologist: New to None  Reason for Consultation: Cryptogenic stroke; recommendations regarding Implantable Loop Recorder Insurance: [employer]  History of Present Illness EP has been asked to evaluate [name] for placement of an implantable loop recorder to monitor for atrial fibrillation by Dr Leonie Man.  The patient was admitted on 03/03/2019 with intermittent aphasia and right sided weakness.  They first developed symptoms while at home.  Imaging demonstrated Asymmetric hyperdensity involving the distal left M1 and/or proximal M2 branch, concerning for LVO.  They have undergone workup for stroke including echocardiogram and CTA Head and Neck, which concluded LVO with occluded thrombus within a proximal left M2 branch, superior division. He underwent IV tPA 03/03/19 and IR mechanical thrombectomy of left M1 occlusion.  The patient has been monitored on telemetry which has demonstrated sinus rhythm with no arrhythmias.  Inpatient stroke work-up Pressley Barsky require a TEE per Neurology.   Echocardiogram this admission demonstrated LVEF 65-70%.  Lab work is reviewed.  Prior to admission, the patient denies chest pain, shortness of breath, dizziness, palpitations, or syncope.  They are recovering from their stroke with plans to return home  at discharge.  Past Medical History:  Diagnosis Date   Colon polyps    Essential thrombocytosis (HCC)    Essential thrombocytosis ([place])    Hyperlipidemia    Hypertension    Lung nodule 12/2004   found on CXR   Osteoporosis      Surgical History:  Past Surgical History:  Procedure Laterality Date   BONE MARROW BIOPSY  01/18/07   COLONOSCOPY  2007, 2010   Dr Allen Norris   IR CT HEAD LTD   03/04/2019   IR PERCUTANEOUS ART THROMBECTOMY/INFUSION INTRACRANIAL INC DIAG ANGIO  03/04/2019   IR US GUIDE [place] RIGHT  03/04/2019   NASAL SINUS SURGERY  06/11/05   RADIOLOGY WITH ANESTHESIA N/A 03/03/2019   Procedure: IR WITH ANESTHESIA;  Surgeon: Luanne Bras, MD;  Location: [facility];  Service: Radiology;  Laterality: N/A;     Medications Prior to Admission  Medication Sig Dispense Refill Last Dose   albuterol (VENTOLIN HFA) 108 (90 Base) MCG/ACT inhaler Inhale 2 puffs into the lungs every 6 (six) hours as needed for wheezing or shortness of breath. 18 g 0 03/02/2019 at Unknown time   amLODipine (NORVASC) 5 MG tablet TAKE 1 TABLET BY MOUTH ONCE DAILY 90 tablet 1 03/02/2019 at Unknown time   aspirin EC 81 MG tablet Take 81 mg by mouth daily. Reported on 08/14/2015   03/02/2019 at Unknown time   azelastine (ASTELIN) 0.1 % nasal spray Place 2 sprays into both nostrils 2 (two) times daily.    03/02/2019 at Unknown time   beclomethasone (QVAR REDIHALER) 40 MCG/ACT inhaler Inhale 1 puff into the lungs 2 (two) times daily. 10.6 g 0 Past Week at Unknown time   benzonatate (TESSALON) 200 MG capsule Take 1 capsule (200 mg total) by mouth 2 (two) times daily as needed for cough. 20 capsule 0 03/02/2019 at Unknown time   Calcium Citrate-Vitamin D (CITRACAL + D PO) Take 2 tablets by mouth daily. Take 2 by mouth daily, calcium 630 and vitamin D 500 each.    Past Week at Unknown time   Cholecalciferol (VITAMIN D3) 1000 UNITS CAPS Take 2,000 Units  by mouth daily. Take 2 by mouth daily   Past Week at Unknown time   clotrimazole-betamethasone (LOTRISONE) cream Apply topically 2 (two) times daily. 90 g 5 Past Week at Unknown time   Cyanocobalamin (B-12 COMPLIANCE INJECTION) 1000 MCG/ML KIT Inject 1,000 mcg as directed every 30 (thirty) days.    Past Week at Unknown time   cyanocobalamin 1000 MCG tablet Take 5,000 mcg by mouth daily.   Past Week at Unknown time   docusate sodium (COLACE) 100 MG capsule Take 100 mg by  mouth 2 (two) times daily as needed for mild constipation.   Past Week at Unknown time   esomeprazole (NEXIUM) 40 MG capsule TAKE 1 CAPSULE BY MOUTH ONCE DAILY AT NOON (Patient taking differently: Take 40 mg by mouth daily. ) 90 capsule 3 Past Week at Unknown time   furosemide (LASIX) 20 MG tablet TAKE 1 TABLET BY MOUTH EVERY OTHER DAY IN MORNING (Patient taking differently: Take 20 mg by mouth every other day. TAKE 1 TABLET BY MOUTH EVERY OTHER DAY IN MORNING) 45 tablet 2 Past Week at Unknown time   hydrALAZINE (APRESOLINE) 50 MG tablet TAKE 1 TABLET BY MOUTH 3 TIMES DAILY 90 tablet 1 03/03/2019 at Unknown time   hydroxyurea (HYDREA) 500 MG capsule TAKE 1 CAPSULE BY MOUTH EVERY OTHER DAY MAY TAKE WITH FOOD TO MINIMIZE GI SIDE EFFECTS. 60 capsule 3 Past Week at Unknown time   Infant Care Products (DERMACLOUD) CREA Apply 1 application topically as needed (after each bowel movement for barrier for  pressure ulcer.). 430 g 2 Past Week at Unknown time   lisinopril (ZESTRIL) 40 MG tablet TAKE 1 TABLET BY MOUTH ONCE DAILY 90 tablet 1 Past Week at Unknown time   doxycycline (VIBRA-TABS) 100 MG tablet Take 1 tablet (100 mg total) by mouth every 12 (twelve) hours for 6 days. 12 tablet 0    testosterone cypionate (DEPOTESTOSTERONE CYPIONATE) 200 MG/ML injection INJECT 0.25 ML INTRAMUSCULARLY EVERY 14 DAYS AS DIRECTED 10 mL 1 02/28/2019    Inpatient Medications:   aspirin EC  81 mg Oral Daily   atorvastatin  10 mg Oral q1800   Chlorhexidine Gluconate Cloth  6 each Topical Daily   clopidogrel  75 mg Oral Daily   enoxaparin (LOVENOX) injection  40 mg Subcutaneous Daily   feeding supplement (ENSURE ENLIVE)  237 mL Oral BID BM   hydrALAZINE  50 mg Oral Q8H   hydroxyurea  500 mg Oral QODAY   lisinopril  40 mg Oral Daily   pantoprazole  40 mg Oral Daily   cyanocobalamin  5,000 mcg Oral Daily    Allergies:  Allergies  Allergen Reactions   Amlodipine     swelling   Atenolol     swelling   Erythromycin      Other reaction(s): Hallucination   Furosemide     Dizziness    Hctz [Hydrochlorothiazide]     hyponatremia   Hydrochlorothiazide W-Triamterene     Other reaction(s): Other (See Comments) Patient stated is causes low sodium Patient stated is causes low sodium Patient stated is causes low sodium  Patient stated is causes low sodium   Patient stated is causes low sodium Patient stated is causes low sodium   Levofloxacin     Causes sleepiness   Metoprolol     dizziness   Micardis [Telmisartan]     Causes swelling   Penicillins Itching    Has patient had a PCN reaction causing immediate rash, facial/tongue/throat swelling, SOB or lightheadedness  with hypotension: No Has patient had a PCN reaction causing severe rash involving mucus membranes or skin necrosis: No Has patient had a PCN reaction that required hospitalization No Has patient had a PCN reaction occurring within the last 10 years: No If all of the above answers are "NO", then may proceed with Cephalosporin use.   Terazosin     Causes swelling in legs and feet   Triamterene-Hctz     Patient stated is causes low sodium    Social History   Socioeconomic History   Marital status: Married    Spouse name: Not on file   Number of children: Not on file   Years of education: Not on file   Highest education level: Not on file  Occupational History   Occupation: Retired    Comment: former Nature conservation officer, [employer]  Tobacco Use   Smoking status: Never Smoker   Smokeless tobacco: Never Used  Substance and Sexual Activity   Alcohol use: No   Drug use: No   Sexual activity: Not Currently  Other Topics Concern   Not on file  Social History Narrative   Not on file   Social Determinants of Health   Financial Resource Strain:    Difficulty of Paying Living Expenses: Not on file  Food Insecurity:    Worried About Charity fundraiser in the Last Year: Not on file   [place] in the Last Year: Not on file  Transportation  Needs:    Lack of Transportation (Medical): Not on file   Lack of Transportation (Non-Medical): Not on file  Physical Activity:    Days of Exercise per Week: Not on file   Minutes of Exercise per Session: Not on file  Stress:    Feeling of Stress : Not on file  Social Connections:    Frequency of Communication with Friends and Family: Not on file   Frequency of Social Gatherings with Friends and Family: Not on file   Attends Religious Services: Not on file   Active Member of Clubs or Organizations: Not on file   Attends Archivist Meetings: Not on file   Marital Status: Not on file  Intimate Partner Violence:    Fear of Current or Ex-Partner: Not on file   Emotionally Abused: Not on file   Physically Abused: Not on file   Sexually Abused: Not on file     Family History  Problem Relation Age of Onset   Colon cancer Mother        colon age 65's   Peripheral vascular disease Father    Colon cancer Brother        colon age 67's   Diabetes Other    Prostate cancer Neg Hx    Bladder Cancer Neg Hx    Kidney cancer Neg Hx       Review of Systems: All other systems reviewed and are otherwise negative except as noted above.  Physical Exam: Vitals:   03/07/19 0300 03/07/19 0400 03/07/19 0500 03/07/19 0600  BP: (!) 149/61 (!) 135/49 (!) 124/45 (!) 139/56  Pulse: 70 71 65 73  Resp: (!) 26 (!) 21 (!) 21 17  Temp:  98.2 F (36.8 C)    TempSrc:  Oral    SpO2: 98% 96% 96% 96%  Weight:      Height:        GEN- The patient is well appearing, alert and oriented x 3 today.   Head- normocephalic, atraumatic Eyes-  Sclera  clear, conjunctiva pink Ears- hearing intact Oropharynx- clear Neck- supple Lungs- Clear to ausculation bilaterally, normal work of breathing Heart- Regular rate and rhythm, no murmurs, rubs or gallops  GI- soft, NT, ND, + BS Extremities- no clubbing, cyanosis, or edema MS- no significant deformity or atrophy Skin- no rash or lesion Psych-  euthymic mood, full affect   Labs:   Lab Results  Component Value Date   WBC 13.0 (H) 03/06/2019   HGB 11.8 (L) 03/06/2019   HCT 35.7 (L) 03/06/2019   MCV 102.0 (H) 03/06/2019   PLT 491 (H) 03/06/2019    Recent Labs  Lab 03/04/19 1537 03/06/19 2338  NA 135 130*  K 3.9 3.9  CL 102 94*  CO2 26 29  BUN 15 19  CREATININE 0.93 1.07  CALCIUM 8.4* 8.4*  PROT 5.3*  --   BILITOT 0.9  --   ALKPHOS 59  --   ALT 18  --   AST 21  --   GLUCOSE 148* 127*     Radiology/Studies: CT Code Stroke CTA Head W/WO contrast  Result Date: 03/03/2019 CLINICAL DATA:  Initial evaluation for acute stroke. EXAM: CT ANGIOGRAPHY HEAD AND NECK CT PERFUSION BRAIN TECHNIQUE: Multidetector CT imaging of the head and neck was performed using the standard protocol during bolus administration of intravenous contrast. Multiplanar CT image reconstructions and MIPs were obtained to evaluate the vascular anatomy. Carotid stenosis measurements (when applicable) are obtained utilizing NASCET criteria, using the distal internal carotid diameter as the denominator. Multiphase CT imaging of the brain was performed following IV bolus contrast injection. Subsequent parametric perfusion maps were calculated using RAPID software. CONTRAST:  138m OMNIPAQUE IOHEXOL 350 MG/ML SOLN COMPARISON:  Prior noncontrast head CT from earlier same day. FINDINGS: CTA NECK FINDINGS Aortic arch: Visualized aortic arch of normal caliber with normal branch pattern. Moderate atherosclerotic change about the arch and origin of the great vessels without hemodynamically significant stenosis. Visualized subclavian arteries patent. Right carotid system: Right common and internal carotid arteries are mildly tortuous but widely patent without significant stenosis, dissection or occlusion. Left carotid system: Left common and internal carotid arteries are mildly tortuous but widely patent without significant stenosis, dissection or occlusion. Vertebral arteries:  Both vertebral arteries arise from the subclavian arteries. Focal plaque at the origin of the right vertebral artery with associated 50% stenosis. Focal non stenotic plaque noted at the origin of the left vertebral artery as well. Vertebral arteries tortuous but otherwise patent within the neck without stenosis, dissection, or occlusion. Skeleton: No acute osseous abnormality. No worrisome lytic or blastic osseous lesions. Moderate multilevel cervical spondylosis without significant stenosis. Other neck: No other acute soft tissue abnormality within the neck. No mass lesion or adenopathy. Upper chest: Mildly enlarged and partially calcified 12 mm precarinal lymph node noted. Patchy multifocal ground-glass opacities seen within the partially visualized lungs, similar to previous. Superimposed scattered subpleural reticular densities and fibrotic changes. Superimposed 9 mm nodular density at the posterior left upper lobe (series 5, image 169). Additional 9 mm pleural base nodular density partially visualized at the anterior right upper lobe (series 5, image 189). Review of the MIP images confirms the above findings CTA HEAD FINDINGS Anterior circulation: Scattered calcified plaque throughout the petrous, cavernous, and supraclinoid ICAs with associated moderate to severe multifocal narrowing. A1 segments patent. Normal anterior communicating artery. Anterior cerebral arteries widely patent to their distal aspects without stenosis. Left M1 patent. There is occlusive thrombus within the proximal left M2 branch, superior division. Attenuated collateral  flow seen distally. Anterior temporal/inferior division remains patent. Right M1 widely patent. Normal right MCA bifurcation. Short-segment severe stenosis at the origin of the right M2 inferior division. Right MCA branches well perfused distally. Posterior circulation: Calcified atheromatous plaque within the dominant left V4 segment with associated moderate multifocal  stenosis. Diminutive right vertebral artery widely patent the vertebrobasilar junction. Posterior inferior cerebral arteries patent bilaterally. Basilar widely patent to its distal aspect. Superior cerebral arteries patent bilaterally. Both PCAs primarily supplied via the basilar. Short-segment severe distal left P2 stenosis noted. PCAs otherwise patent to their distal aspects without stenosis. Venous sinuses: Patent. Anatomic variants: None significant. Review of the MIP images confirms the above findings CT Brain Perfusion Findings: ASPECTS: 9 CBF (<30%) Volume: 54m Perfusion (Tmax>6.0s) volume: 532mMismatch Volume: 4442mnfarction Location:Patchy small volume acute core infarct seen involving the deep white matter of the left cerebral hemisphere, somewhat watershed in distribution. Surrounding ischemic penumbra of 55 cc, with mismatch volume of 44 cc. IMPRESSION: CTA HEAD AND NECK IMPRESSION: 1. Positive CTA for LVO with occlusive thrombus within a proximal left M2 branch, superior division. 2. Heavy calcified plaque throughout the carotid siphons with associated moderate to severe multifocal narrowing. 3. Short-segment severe proximal right M2 and distal left P2 stenoses. 4. Approximate 50% atheromatous stenoses at the origin of the right vertebral artery as well as involving the left V4 segment. CT PERFUSION IMPRESSION: 1. 11 cc acute core infarct involving the deep white matter of the left cerebral hemisphere, watershed in distribution. Surrounding 55 cc ischemic penumbra involving much of the left MCA. 2. Aspects equals 9 on prior head CT. These results were communicated to Dr. LinCheral Marker 11:22 pmon 1/2/2021by text page via the [facility]aging system. Electronically Signed   By: BenJeannine BogaD.   On: 03/03/2019 23:44   DG Chest 2 View  Result Date: 03/01/2019 CLINICAL DATA:  Shortness of breath, cough EXAM: CHEST - 2 VIEW COMPARISON:  09/28/2018 FINDINGS: Stable cardiomediastinal contours.  Calcified thoracic aorta. Chronic interstitial lung changes without definite new superimposed airspace opacity. No pleural effusion or pneumothorax. IMPRESSION: Chronic interstitial lung changes without definite superimposed airspace opacity Electronically Signed   By: NicDavina PokeO.   On: 03/01/2019 16:48   CT Code Stroke CTA Neck W/WO contrast  Result Date: 03/03/2019 CLINICAL DATA:  Initial evaluation for acute stroke. EXAM: CT ANGIOGRAPHY HEAD AND NECK CT PERFUSION BRAIN TECHNIQUE: Multidetector CT imaging of the head and neck was performed using the standard protocol during bolus administration of intravenous contrast. Multiplanar CT image reconstructions and MIPs were obtained to evaluate the vascular anatomy. Carotid stenosis measurements (when applicable) are obtained utilizing NASCET criteria, using the distal internal carotid diameter as the denominator. Multiphase CT imaging of the brain was performed following IV bolus contrast injection. Subsequent parametric perfusion maps were calculated using RAPID software. CONTRAST:  115m28mNIPAQUE IOHEXOL 350 MG/ML SOLN COMPARISON:  Prior noncontrast head CT from earlier same day. FINDINGS: CTA NECK FINDINGS Aortic arch: Visualized aortic arch of normal caliber with normal branch pattern. Moderate atherosclerotic change about the arch and origin of the great vessels without hemodynamically significant stenosis. Visualized subclavian arteries patent. Right carotid system: Right common and internal carotid arteries are mildly tortuous but widely patent without significant stenosis, dissection or occlusion. Left carotid system: Left common and internal carotid arteries are mildly tortuous but widely patent without significant stenosis, dissection or occlusion. Vertebral arteries: Both vertebral arteries arise from the subclavian arteries. Focal plaque at the origin of the  right vertebral artery with associated 50% stenosis. Focal non stenotic plaque noted  at the origin of the left vertebral artery as well. Vertebral arteries tortuous but otherwise patent within the neck without stenosis, dissection, or occlusion. Skeleton: No acute osseous abnormality. No worrisome lytic or blastic osseous lesions. Moderate multilevel cervical spondylosis without significant stenosis. Other neck: No other acute soft tissue abnormality within the neck. No mass lesion or adenopathy. Upper chest: Mildly enlarged and partially calcified 12 mm precarinal lymph node noted. Patchy multifocal ground-glass opacities seen within the partially visualized lungs, similar to previous. Superimposed scattered subpleural reticular densities and fibrotic changes. Superimposed 9 mm nodular density at the posterior left upper lobe (series 5, image 169). Additional 9 mm pleural base nodular density partially visualized at the anterior right upper lobe (series 5, image 189). Review of the MIP images confirms the above findings CTA HEAD FINDINGS Anterior circulation: Scattered calcified plaque throughout the petrous, cavernous, and supraclinoid ICAs with associated moderate to severe multifocal narrowing. A1 segments patent. Normal anterior communicating artery. Anterior cerebral arteries widely patent to their distal aspects without stenosis. Left M1 patent. There is occlusive thrombus within the proximal left M2 branch, superior division. Attenuated collateral flow seen distally. Anterior temporal/inferior division remains patent. Right M1 widely patent. Normal right MCA bifurcation. Short-segment severe stenosis at the origin of the right M2 inferior division. Right MCA branches well perfused distally. Posterior circulation: Calcified atheromatous plaque within the dominant left V4 segment with associated moderate multifocal stenosis. Diminutive right vertebral artery widely patent the vertebrobasilar junction. Posterior inferior cerebral arteries patent bilaterally. Basilar widely patent to its distal  aspect. Superior cerebral arteries patent bilaterally. Both PCAs primarily supplied via the basilar. Short-segment severe distal left P2 stenosis noted. PCAs otherwise patent to their distal aspects without stenosis. Venous sinuses: Patent. Anatomic variants: None significant. Review of the MIP images confirms the above findings CT Brain Perfusion Findings: ASPECTS: 9 CBF (<30%) Volume: 85m Perfusion (Tmax>6.0s) volume: 532mMismatch Volume: 4429mnfarction Location:Patchy small volume acute core infarct seen involving the deep white matter of the left cerebral hemisphere, somewhat watershed in distribution. Surrounding ischemic penumbra of 55 cc, with mismatch volume of 44 cc. IMPRESSION: CTA HEAD AND NECK IMPRESSION: 1. Positive CTA for LVO with occlusive thrombus within a proximal left M2 branch, superior division. 2. Heavy calcified plaque throughout the carotid siphons with associated moderate to severe multifocal narrowing. 3. Short-segment severe proximal right M2 and distal left P2 stenoses. 4. Approximate 50% atheromatous stenoses at the origin of the right vertebral artery as well as involving the left V4 segment. CT PERFUSION IMPRESSION: 1. 11 cc acute core infarct involving the deep white matter of the left cerebral hemisphere, watershed in distribution. Surrounding 55 cc ischemic penumbra involving much of the left MCA. 2. Aspects equals 9 on prior head CT. These results were communicated to Dr. LinCheral Marker 11:22 pmon 1/2/2021by text page via the [facility]aging system. Electronically Signed   By: BenJeannine BogaD.   On: 03/03/2019 23:44   CT Angio Chest PE W and/or Wo Contrast  Result Date: 03/02/2019 CLINICAL DATA:  Shortness of breath. EXAM: CT ANGIOGRAPHY CHEST WITH CONTRAST TECHNIQUE: Multidetector CT imaging of the chest was performed using the standard protocol during bolus administration of intravenous contrast. Multiplanar CT image reconstructions and MIPs were obtained to evaluate  the vascular anatomy. CONTRAST:  63m100mNIPAQUE IOHEXOL 350 MG/ML SOLN COMPARISON:  Radiograph yesterday. Chest CT 04/20/2016 had an outside institution. FINDINGS: Cardiovascular: There are no filling  defects within the pulmonary arteries to suggest pulmonary embolus. Mild prominence of the left and right main pulmonary arteries measuring 2.7 and 2.8 cm respectively. Aortic atherosclerosis and tortuosity. No aortic aneurysm or dissection. Coronary artery calcifications. Normal heart size with slight right heart dilatation. Contrast refluxes into the hepatic veins and IVC. Mediastinum/Nodes: Calcified mediastinal and hilar lymph nodes. No noncalcified adenopathy. No esophageal wall thickening. No suspicious thyroid nodule. Lungs/Pleura: Bilateral subpleural reticulation in a basilar predominant distribution, similar to prior exam. Slight increased density within the dependent right greater than left lower lobe reticulation may represent pulmonary microlithiasis, this is also unchanged. No acute airspace disease. No pulmonary edema or pleural effusion. Trachea and mainstem bronchi are patent. No pulmonary mass. Upper Abdomen: Contrast refluxes into the hepatic veins and IVC. No other acute finding. Musculoskeletal: There are no acute or suspicious osseous abnormalities. Review of the MIP images confirms the above findings. IMPRESSION: 1. No pulmonary embolus. 2. Contrast reflux into the hepatic veins and IVC, suggesting elevated right heart pressures. 3. Unchanged chronic interstitial lung disease since 2018, likely pulmonary fibrosis/UIP. Aortic Atherosclerosis (ICD10-I70.0). Electronically Signed   By: Keith Rake M.D.   On: 03/02/2019 03:06   MR BRAIN WO CONTRAST  Result Date: 03/04/2019 CLINICAL DATA:  Initial evaluation for acute stroke, status post tPA. EXAM: MRI HEAD WITHOUT CONTRAST TECHNIQUE: Multiplanar, multiecho pulse sequences of the brain and surrounding structures were obtained without  intravenous contrast. COMPARISON:  Prior CTs from 03/04/2019. FINDINGS: Brain: Generalized age-related cerebral atrophy. Patchy T2/FLAIR hyperintensity within the periventricular and deep white matter both cerebral hemispheres most consistent with chronic small vessel ischemic disease, mild for age. Few scattered small remote bilateral cerebellar infarcts noted. Patchy restricted diffusion seen involving the left caudate and lentiform nuclei, consistent with acute left MCA territory infarcts. Single additional subcentimeter cortical infarct noted at the adjacent left frontal operculum. No associated hemorrhage or mass effect. No other evidence for acute or subacute ischemia. Gray-white matter differentiation otherwise maintained. No other areas of remote cortical infarction. No foci of susceptibility artifact to suggest acute or chronic intracranial hemorrhage. No mass lesion, midline shift or mass effect. No hydrocephalus. No extra-axial fluid collection. Pituitary gland within normal limits. Vascular: Major intracranial vascular flow voids are maintained. Skull and upper cervical spine: Craniocervical junction normal. Bone marrow signal intensity within normal limits. No scalp soft tissue abnormality. Sinuses/Orbits: Patient status post ocular lens replacement on the left. Globes and orbital soft tissues demonstrate no acute finding. Scattered mucosal thickening noted throughout the paranasal sinuses, most notable within the right sphenoid sinus. Trace left mastoid effusion noted, of doubtful significance. Inner ear structures grossly normal. Other: None. IMPRESSION: 1. Acute ischemic left MCA territory infarcts primarily involving the left caudate and lentiform nuclei. No associated hemorrhage or mass effect. 2. No other acute intracranial abnormality. 3. Age-related cerebral atrophy with mild chronic small vessel ischemic disease. Electronically Signed   By: Jeannine Boga M.D.   On: 03/04/2019 23:01    IR CT Head Ltd  Result Date: 03/04/2019 INDICATION: 86 year old male with a history of acute right-sided symptoms and identification left MCA emergent large vessel occlusion EXAM: ULTRASOUND GUIDED ACCESS RIGHT COMMON FEMORAL ARTERY CERVICAL AND CEREBRAL ANGIOGRAM MECHANICAL THROMBECTOMY OF LEFT MCA DEPLOYMENT OF ANGIO-SEAL FOR HEMOSTASIS COMPARISON:  CT IMAGING SAME DAY MEDICATIONS: 1 g vancomycin. The antibiotic was administered within 1 hour of the procedure ANESTHESIA/SEDATION: The anesthesia team was present to provide general endotracheal tube anesthesia and for patient monitoring during the procedure. Interventional neuro radiology  nursing staff was also present. The patient was also continuously cared for during the procedure by the interventional radiology nurse under my direct supervision. CONTRAST:  50 cc IV contrast FLUOROSCOPY TIME:  Fluoroscopy Time: 14 minutes 24 seconds (1,007 mGy). COMPLICATIONS: None immediate. TECHNIQUE: Informed written consent was obtained from the patient after a thorough discussion of the procedural risks, benefits and alternatives. All questions were addressed. Maximal Sterile Barrier Technique was utilized including caps, mask, sterile gowns, sterile gloves, sterile drape, hand hygiene and skin antiseptic. A timeout was performed prior to the initiation of the procedure. Informed written consent was obtained from the patient's family after a thorough discussion of the procedural risks, benefits and alternatives. Specific risks discussed include: Bleeding, infection, contrast reaction, kidney injury/failure, need for further procedure/surgery, arterial injury or dissection, embolization to new territory, intracranial hemorrhage (10-15% risk), neurologic deterioration, cardiopulmonary collapse, death. All questions were addressed. Maximal Sterile Barrier Technique was utilized including during the procedure including caps, mask, sterile gowns, sterile gloves, sterile  drape, hand hygiene and skin antiseptic. A timeout was performed prior to the initiation of the procedure. The anesthesia team was present to provide general endotracheal tube anesthesia and for patient monitoring during the procedure. Interventional neuro radiology nursing staff was also present. FINDINGS: Initial Findings: Left common carotid artery:  Normal course caliber and contour. Left external carotid artery: Patent with antegrade flow. Left internal carotid artery: Normal course caliber and contour of the cervical portion. Vertical and petrous segment patent with normal course caliber contour. Cavernous segment patent. Clinoid segment patent. Antegrade flow of the ophthalmic artery. Ophthalmic segment patent. Terminus patent. Left MCA:  Proximal M1 segment patent Early temporal branch, supplying the left temporal lobe. Early angiographic image demonstrates significant leptomeningeal collateral flow into the MCA territory from the [facility] territory and the patent temporal branches. Distal M1 occlusion. Left ACA: A 1 segment patent. A 2 segment perfuses the right territory. Patent anterior communicating artery. Leptomeningeal collaterals from the [place] territory through the watershed ANCA territory. Completion Findings: Left MCA: Restoration of fluoro through the MCA after mechanical thrombectomy. TICI 3 flow restored Flat panel CT demonstrates no subarachnoid hemorrhage. PROCEDURE: Patient is brought emergently to the neuro angiography suite, with the patient identified appropriately and placed supine position on the table. Left radial arterial line was placed by the anesthesia team. The patient is then prepped and draped in the usual sterile fashion. Ultrasound survey of the right inguinal region was performed with images stored and sent to PACs. 11 blade scalpel was used to make a small incision. Blunt dissection was performed. A micropuncture needle was used access the right common femoral artery under  ultrasound. With excellent arterial blood flow returned, an .018 micro wire was passed through the needle, observed to enter the abdominal aorta under fluoroscopy. The needle was removed, and a micropuncture sheath was placed over the wire. The inner dilator and wire were removed, and an 035 Bentson wire was advanced under fluoroscopy into the abdominal aorta. The sheath was removed and a standard 5 [place] vascular sheath was placed. The dilator was removed and the sheath was flushed. A 46F JB-1 diagnostic catheter was advanced over the wire to the proximal descending thoracic aorta. Wire was then removed. Double flush of the catheter was performed. Catheter was then used to select the left common carotid artery. Formal angiogram was performed demonstrating somewhat tortuous common carotid artery. Standard Glidewire and the JB 1 catheter were used to navigate to the bifurcation. Wire was removed and  angiogram was performed. The roadrunner wire was then used to navigate the JB 1 catheter to the petrous segment. Wire was removed. Exchange length Rosen wire was then passed through the diagnostic catheter to the petrous ICA and the diagnostic catheter was removed. The 5 French sheath was removed and exchanged for 8 French 55 centimeter BrightTip sheath. Sheath was flushed and attached to pressurized and heparinized saline bag for constant forward flow. At this point we attempted to pass the 95 cm Walrus balloon guide catheter through the bright tip sheath. Multiple attempts using the introducer and without the introducer failed, and we ultimately decided that there was a mismatch, perhaps from quality control of the sheath. We then elected to use a flow gait balloon catheter. Eight French 95 cm flow gate balloon catheter was then advanced over the wire to the distal cervical segment. Wire was removed. Then a coaxial intermediate catheter and microcatheter combination was prepared on the back table. This combination was  CAT-5 catheter and a Trevo TRK microcatheter, with a synchro soft wire. This combination was then advanced through the balloon guide into the ICA. System was advanced into the internal carotid artery, to the level of the occlusion. The micro wire was then carefully advanced through the occluded segment. Microcatheter was then pushed through the occluded segment and the wire was removed. Intermediate catheter was advanced to the distal balloon guide. Blood was then aspirated through the hub of the microcatheter, and a gentle contrast injection was performed confirming intraluminal position. A rotating hemostatic valve was then attached to the back end of the microcatheter, and a pressurized and heparinized saline bag was attached to the catheter. 60m x 331mTrevo NXT device was then selected. Back flush was achieved at the rotating hemostatic valve, and then the device was gently advanced through the microcatheter to the distal end. The retriever was then unsheathed by withdrawing the microcatheter under fluoroscopy. Once the retriever was completely unsheathed, the microcatheter was carefully stripped from the delivery wire of the device. 3 minute time interval was observed. The balloon on the balloon guide was then inflated to profile of the vessel. Constant suction aspiration was then performed through the intermediate catheter, and constant gentle aspiration was performed at the balloon guide. This aspiration was continued as the retriever was gently and slowly withdrawn with fluoroscopic observation into the distal intermediate catheter. The entire system was then gently withdrawn from the intracranial ICA and into the balloon guide. Once the retriever was entirely removed from the system, free aspiration was confirmed at the hub of the balloon guide, with free blood return confirmed. Control angiogram was then performed, confirming restoration of flow. Balloon guide was withdrawn with angiogram performed of the  cervical ICA. Balloon guide was removed, and the bright tip sheath was removed and a wire with Angio-Seal deployed. Pulses were confirmed bilateral lower extremity. Patient tolerated the procedure well and remained hemodynamically stable throughout. No complications were encountered. Estimated blood loss approximately 75 cc. IMPRESSION: Status post ultrasound guided access right common femoral artery for left-sided cervical and cerebral angiogram and mechanical thrombectomy of left M1 occlusion via single pass of Trevo 3x32 NXT device. Angio-Seal deployed for hemostasis. Signed, JaDulcy FannyWaDellia NimsRPVI [facility]: Admit to ICU Patient was extubated in the room Blood pressure target of 16081ystolic Right hip straight times 8 hours, status post Angio-Seal deployment. Electronically Signed   By: JaCorrie Mckusick.O.   On: 03/04/2019 01:25  IR US Guide Vasc Access Right  Result Date: 03/04/2019 INDICATION: 86 year old male with a history of acute right-sided symptoms and identification left MCA emergent large vessel occlusion EXAM: ULTRASOUND GUIDED ACCESS RIGHT COMMON FEMORAL ARTERY CERVICAL AND CEREBRAL ANGIOGRAM MECHANICAL THROMBECTOMY OF LEFT MCA DEPLOYMENT OF ANGIO-SEAL FOR HEMOSTASIS COMPARISON:  CT IMAGING SAME DAY MEDICATIONS: 1 g vancomycin. The antibiotic was administered within 1 hour of the procedure ANESTHESIA/SEDATION: The anesthesia team was present to provide general endotracheal tube anesthesia and for patient monitoring during the procedure. Interventional neuro radiology nursing staff was also present. The patient was also continuously cared for during the procedure by the interventional radiology nurse under my direct supervision. CONTRAST:  50 cc IV contrast FLUOROSCOPY TIME:  Fluoroscopy Time: 14 minutes 24 seconds (1,007 mGy). COMPLICATIONS: None immediate. TECHNIQUE: Informed written consent was obtained from the patient after a  thorough discussion of the procedural risks, benefits and alternatives. All questions were addressed. Maximal Sterile Barrier Technique was utilized including caps, mask, sterile gowns, sterile gloves, sterile drape, hand hygiene and skin antiseptic. A timeout was performed prior to the initiation of the procedure. Informed written consent was obtained from the patient's family after a thorough discussion of the procedural risks, benefits and alternatives. Specific risks discussed include: Bleeding, infection, contrast reaction, kidney injury/failure, need for further procedure/surgery, arterial injury or dissection, embolization to new territory, intracranial hemorrhage (10-15% risk), neurologic deterioration, cardiopulmonary collapse, death. All questions were addressed. Maximal Sterile Barrier Technique was utilized including during the procedure including caps, mask, sterile gowns, sterile gloves, sterile drape, hand hygiene and skin antiseptic. A timeout was performed prior to the initiation of the procedure. The anesthesia team was present to provide general endotracheal tube anesthesia and for patient monitoring during the procedure. Interventional neuro radiology nursing staff was also present. FINDINGS: Initial Findings: Left common carotid artery:  Normal course caliber and contour. Left external carotid artery: Patent with antegrade flow. Left internal carotid artery: Normal course caliber and contour of the cervical portion. Vertical and petrous segment patent with normal course caliber contour. Cavernous segment patent. Clinoid segment patent. Antegrade flow of the ophthalmic artery. Ophthalmic segment patent. Terminus patent. Left MCA:  Proximal M1 segment patent Early temporal branch, supplying the left temporal lobe. Early angiographic image demonstrates significant leptomeningeal collateral flow into the MCA territory from the [facility] territory and the patent temporal branches. Distal M1 occlusion. Left  ACA: A 1 segment patent. A 2 segment perfuses the right territory. Patent anterior communicating artery. Leptomeningeal collaterals from the [place] territory through the watershed ANCA territory. Completion Findings: Left MCA: Restoration of fluoro through the MCA after mechanical thrombectomy. TICI 3 flow restored Flat panel CT demonstrates no subarachnoid hemorrhage. PROCEDURE: Patient is brought emergently to the neuro angiography suite, with the patient identified appropriately and placed supine position on the table. Left radial arterial line was placed by the anesthesia team. The patient is then prepped and draped in the usual sterile fashion. Ultrasound survey of the right inguinal region was performed with images stored and sent to PACs. 11 blade scalpel was used to make a small incision. Blunt dissection was performed. A micropuncture needle was used access the right common femoral artery under ultrasound. With excellent arterial blood flow returned, an .018 micro wire was passed through the needle, observed to enter the abdominal aorta under fluoroscopy. The needle was removed, and a micropuncture sheath was placed over the wire. The inner dilator and wire were removed, and an 035 Bentson wire was advanced under fluoroscopy  into the abdominal aorta. The sheath was removed and a standard 5 [place] vascular sheath was placed. The dilator was removed and the sheath was flushed. A 100F JB-1 diagnostic catheter was advanced over the wire to the proximal descending thoracic aorta. Wire was then removed. Double flush of the catheter was performed. Catheter was then used to select the left common carotid artery. Formal angiogram was performed demonstrating somewhat tortuous common carotid artery. Standard Glidewire and the JB 1 catheter were used to navigate to the bifurcation. Wire was removed and angiogram was performed. The roadrunner wire was then used to navigate the JB 1 catheter to the petrous segment. Wire was  removed. Exchange length Rosen wire was then passed through the diagnostic catheter to the petrous ICA and the diagnostic catheter was removed. The 5 French sheath was removed and exchanged for 8 French 55 centimeter BrightTip sheath. Sheath was flushed and attached to pressurized and heparinized saline bag for constant forward flow. At this point we attempted to pass the 95 cm Walrus balloon guide catheter through the bright tip sheath. Multiple attempts using the introducer and without the introducer failed, and we ultimately decided that there was a mismatch, perhaps from quality control of the sheath. We then elected to use a flow gait balloon catheter. Eight French 95 cm flow gate balloon catheter was then advanced over the wire to the distal cervical segment. Wire was removed. Then a coaxial intermediate catheter and microcatheter combination was prepared on the back table. This combination was CAT-5 catheter and a Trevo TRK microcatheter, with a synchro soft wire. This combination was then advanced through the balloon guide into the ICA. System was advanced into the internal carotid artery, to the level of the occlusion. The micro wire was then carefully advanced through the occluded segment. Microcatheter was then pushed through the occluded segment and the wire was removed. Intermediate catheter was advanced to the distal balloon guide. Blood was then aspirated through the hub of the microcatheter, and a gentle contrast injection was performed confirming intraluminal position. A rotating hemostatic valve was then attached to the back end of the microcatheter, and a pressurized and heparinized saline bag was attached to the catheter. 60m x 357mTrevo NXT device was then selected. Back flush was achieved at the rotating hemostatic valve, and then the device was gently advanced through the microcatheter to the distal end. The retriever was then unsheathed by withdrawing the microcatheter under fluoroscopy.  Once the retriever was completely unsheathed, the microcatheter was carefully stripped from the delivery wire of the device. 3 minute time interval was observed. The balloon on the balloon guide was then inflated to profile of the vessel. Constant suction aspiration was then performed through the intermediate catheter, and constant gentle aspiration was performed at the balloon guide. This aspiration was continued as the retriever was gently and slowly withdrawn with fluoroscopic observation into the distal intermediate catheter. The entire system was then gently withdrawn from the intracranial ICA and into the balloon guide. Once the retriever was entirely removed from the system, free aspiration was confirmed at the hub of the balloon guide, with free blood return confirmed. Control angiogram was then performed, confirming restoration of flow. Balloon guide was withdrawn with angiogram performed of the cervical ICA. Balloon guide was removed, and the bright tip sheath was removed and a wire with Angio-Seal deployed. Pulses were confirmed bilateral lower extremity. Patient tolerated the procedure well and remained hemodynamically stable throughout. No complications were encountered. Estimated blood loss  approximately 75 cc. IMPRESSION: Status post ultrasound guided access right common femoral artery for left-sided cervical and cerebral angiogram and mechanical thrombectomy of left M1 occlusion via single pass of Trevo 3x32 NXT device. Angio-Seal deployed for hemostasis. Signed, Dulcy Fanny. Dellia Nims, RPVI [facility] Interventional Radiology Specialists [facility] Radiology PLAN: Admit to ICU Patient was extubated in the room Blood pressure target of 379 systolic Right hip straight times 8 hours, status post Angio-Seal deployment. Electronically Signed   By: Corrie Mckusick D.O.   On: 03/04/2019 01:25   CT Code Stroke Cerebral Perfusion with contrast  Result Date: 03/03/2019 CLINICAL DATA:  Initial evaluation for  acute stroke. EXAM: CT ANGIOGRAPHY HEAD AND NECK CT PERFUSION BRAIN TECHNIQUE: Multidetector CT imaging of the head and neck was performed using the standard protocol during bolus administration of intravenous contrast. Multiplanar CT image reconstructions and MIPs were obtained to evaluate the vascular anatomy. Carotid stenosis measurements (when applicable) are obtained utilizing NASCET criteria, using the distal internal carotid diameter as the denominator. Multiphase CT imaging of the brain was performed following IV bolus contrast injection. Subsequent parametric perfusion maps were calculated using RAPID software. CONTRAST:  141m OMNIPAQUE IOHEXOL 350 MG/ML SOLN COMPARISON:  Prior noncontrast head CT from earlier same day. FINDINGS: CTA NECK FINDINGS Aortic arch: Visualized aortic arch of normal caliber with normal branch pattern. Moderate atherosclerotic change about the arch and origin of the great vessels without hemodynamically significant stenosis. Visualized subclavian arteries patent. Right carotid system: Right common and internal carotid arteries are mildly tortuous but widely patent without significant stenosis, dissection or occlusion. Left carotid system: Left common and internal carotid arteries are mildly tortuous but widely patent without significant stenosis, dissection or occlusion. Vertebral arteries: Both vertebral arteries arise from the subclavian arteries. Focal plaque at the origin of the right vertebral artery with associated 50% stenosis. Focal non stenotic plaque noted at the origin of the left vertebral artery as well. Vertebral arteries tortuous but otherwise patent within the neck without stenosis, dissection, or occlusion. Skeleton: No acute osseous abnormality. No worrisome lytic or blastic osseous lesions. Moderate multilevel cervical spondylosis without significant stenosis. Other neck: No other acute soft tissue abnormality within the neck. No mass lesion or adenopathy. Upper  chest: Mildly enlarged and partially calcified 12 mm precarinal lymph node noted. Patchy multifocal ground-glass opacities seen within the partially visualized lungs, similar to previous. Superimposed scattered subpleural reticular densities and fibrotic changes. Superimposed 9 mm nodular density at the posterior left upper lobe (series 5, image 169). Additional 9 mm pleural base nodular density partially visualized at the anterior right upper lobe (series 5, image 189). Review of the MIP images confirms the above findings CTA HEAD FINDINGS Anterior circulation: Scattered calcified plaque throughout the petrous, cavernous, and supraclinoid ICAs with associated moderate to severe multifocal narrowing. A1 segments patent. Normal anterior communicating artery. Anterior cerebral arteries widely patent to their distal aspects without stenosis. Left M1 patent. There is occlusive thrombus within the proximal left M2 branch, superior division. Attenuated collateral flow seen distally. Anterior temporal/inferior division remains patent. Right M1 widely patent. Normal right MCA bifurcation. Short-segment severe stenosis at the origin of the right M2 inferior division. Right MCA branches well perfused distally. Posterior circulation: Calcified atheromatous plaque within the dominant left V4 segment with associated moderate multifocal stenosis. Diminutive right vertebral artery widely patent the vertebrobasilar junction. Posterior inferior cerebral arteries patent bilaterally. Basilar widely patent to its distal aspect. Superior cerebral arteries patent bilaterally. Both PCAs primarily supplied via the basilar.  Short-segment severe distal left P2 stenosis noted. PCAs otherwise patent to their distal aspects without stenosis. Venous sinuses: Patent. Anatomic variants: None significant. Review of the MIP images confirms the above findings CT Brain Perfusion Findings: ASPECTS: 9 CBF (<30%) Volume: 58m Perfusion (Tmax>6.0s)  volume: 566mMismatch Volume: 4464mnfarction Location:Patchy small volume acute core infarct seen involving the deep white matter of the left cerebral hemisphere, somewhat watershed in distribution. Surrounding ischemic penumbra of 55 cc, with mismatch volume of 44 cc. IMPRESSION: CTA HEAD AND NECK IMPRESSION: 1. Positive CTA for LVO with occlusive thrombus within a proximal left M2 branch, superior division. 2. Heavy calcified plaque throughout the carotid siphons with associated moderate to severe multifocal narrowing. 3. Short-segment severe proximal right M2 and distal left P2 stenoses. 4. Approximate 50% atheromatous stenoses at the origin of the right vertebral artery as well as involving the left V4 segment. CT PERFUSION IMPRESSION: 1. 11 cc acute core infarct involving the deep white matter of the left cerebral hemisphere, watershed in distribution. Surrounding 55 cc ischemic penumbra involving much of the left MCA. 2. Aspects equals 9 on prior head CT. These results were communicated to Dr. LinCheral Marker 11:22 pmon 1/2/2021by text page via the [facility] Centers[facility]aging system. Electronically Signed   By: BenJeannine BogaD.   On: 03/03/2019 23:44   ECHOCARDIOGRAM COMPLETE  Result Date: 03/05/2019   ECHOCARDIOGRAM REPORT   Patient Name:   ARTWIL SLAPEte of Exam: 03/05/2019 Medical Rec #:  [id number]  Height:       68.0 in Accession #:    [id number] Weight:       138.0 lb Date of Birth:  03/13/1925     BSA:          1.75 m Patient [age]:    [age]      BP:           161/65 mmHg Patient Gender: M             HR:           89 bpm. Exam Location:  Inpatient Procedure: 2D Echo Indications:    434.91 stroke  History:        Patient has prior history of Echocardiogram examinations, most                 recent 11/21/2012. Risk Factors:Hypertension and Dyslipidemia.  Sonographer:    VijJannett CelestineCS (AE) Referring Phys: [phone] LINDZEN  Sonographer Comments: Suboptimal subcostal window. IMPRESSIONS  1. Left  ventricular ejection fraction, by visual estimation, is 65 to 70%. The left ventricle has normal function. There is moderately increased left ventricular hypertrophy.  2. Left ventricular diastolic parameters are consistent with Grade I diastolic dysfunction (impaired relaxation).  3. The left ventricle has no regional wall motion abnormalities.  4. Global right ventricle has normal systolic function.The right ventricular size is normal. No increase in right ventricular wall thickness.  5. Left atrial size was normal.  6. Right atrial size was normal.  7. The mitral valve is normal in structure. Trivial mitral valve regurgitation. No evidence of mitral stenosis.  8. The tricuspid valve is normal in structure.  9. The aortic valve is tricuspid. Aortic valve regurgitation is mild. Mild aortic valve sclerosis without stenosis. 10. The pulmonic valve was normal in structure. Pulmonic valve regurgitation is not visualized. 11. Aortic dilatation noted. 12. There is mild dilatation of the ascending aorta measuring 40 mm. 13. Severely elevated pulmonary  artery systolic pressure. 14. The atrial septum is grossly normal. FINDINGS  Left Ventricle: Left ventricular ejection fraction, by visual estimation, is 65 to 70%. The left ventricle has normal function. The left ventricle has no regional wall motion abnormalities. There is moderately increased left ventricular hypertrophy. Concentric left ventricular hypertrophy. Left ventricular diastolic parameters are consistent with Grade I diastolic dysfunction (impaired relaxation). Right Ventricle: The right ventricular size is normal. No increase in right ventricular wall thickness. Global RV systolic function is has normal systolic function. Left Atrium: Left atrial size was normal in size. Right Atrium: Right atrial size was normal in size Pericardium: There is no evidence of pericardial effusion. Mitral Valve: The mitral valve is normal in structure. Trivial mitral valve  regurgitation. No evidence of mitral valve stenosis by observation. Tricuspid Valve: The tricuspid valve is normal in structure. Tricuspid valve regurgitation is mild. Aortic Valve: The aortic valve is tricuspid. Aortic valve regurgitation is mild. Mild aortic valve sclerosis is present, with no evidence of aortic valve stenosis. Mild aortic valve annular calcification. Pulmonic Valve: The pulmonic valve was normal in structure. Pulmonic valve regurgitation is not visualized. Pulmonic regurgitation is not visualized. Aorta: Aortic dilatation noted. There is mild dilatation of the ascending aorta measuring 40 mm. IAS/Shunts: The atrial septum is grossly normal.  LEFT VENTRICLE PLAX 2D LVIDd:         3.11 cm  Diastology LVIDs:         1.88 cm  LV e' lateral:   8.70 cm/s LV PW:         1.32 cm  LV E/e' lateral: 6.9 LV IVS:        1.40 cm  LV e' medial:    8.16 cm/s LVOT diam:     2.20 cm  LV E/e' medial:  7.4 LV SV:         27 ml LV SV Index:   15.81 LVOT Area:     3.80 cm  LEFT ATRIUM             Index       RIGHT ATRIUM           Index LA diam:        2.60 cm 1.49 cm/m  RA Area:     13.80 cm LA Vol (A2C):   50.7 ml 29.04 ml/m RA Volume:   28.00 ml  16.04 ml/m LA Vol (A4C):   35.7 ml 20.45 ml/m LA Biplane Vol: 45.1 ml 25.84 ml/m  AORTIC VALVE LVOT Vmax:   95.10 cm/s LVOT Vmean:  68.900 cm/s LVOT VTI:    0.210 m  AORTA Ao Root diam: 3.90 cm MITRAL VALVE                        TRICUSPID VALVE MV Area (PHT): 3.77 cm             TR Peak grad:   58.1 mmHg MV PHT:        58.29 msec           TR Vmax:        416.00 cm/s MV Decel Time: 201 msec MV E velocity: 60.00 cm/s 103 cm/s  SHUNTS MV A velocity: 84.50 cm/s 70.3 cm/s Systemic VTI:  0.21 m MV E/A ratio:  0.71       1.5       Systemic Diam: 2.20 cm  [name] MD Electronically signed by [name] MD Signature Date/Time: 03/05/2019/1:08:04 PM  Final    IR PERCUTANEOUS ART THROMBECTOMY/INFUSION INTRACRANIAL INC DIAG ANGIO  Result Date:  03/04/2019 INDICATION: 86 year old male with a history of acute right-sided symptoms and identification left MCA emergent large vessel occlusion EXAM: ULTRASOUND GUIDED ACCESS RIGHT COMMON FEMORAL ARTERY CERVICAL AND CEREBRAL ANGIOGRAM MECHANICAL THROMBECTOMY OF LEFT MCA DEPLOYMENT OF ANGIO-SEAL FOR HEMOSTASIS COMPARISON:  CT IMAGING SAME DAY MEDICATIONS: 1 g vancomycin. The antibiotic was administered within 1 hour of the procedure ANESTHESIA/SEDATION: The anesthesia team was present to provide general endotracheal tube anesthesia and for patient monitoring during the procedure. Interventional neuro radiology nursing staff was also present. The patient was also continuously cared for during the procedure by the interventional radiology nurse under my direct supervision. CONTRAST:  50 cc IV contrast FLUOROSCOPY TIME:  Fluoroscopy Time: 14 minutes 24 seconds (1,007 mGy). COMPLICATIONS: None immediate. TECHNIQUE: Informed written consent was obtained from the patient after a thorough discussion of the procedural risks, benefits and alternatives. All questions were addressed. Maximal Sterile Barrier Technique was utilized including caps, mask, sterile gowns, sterile gloves, sterile drape, hand hygiene and skin antiseptic. A timeout was performed prior to the initiation of the procedure. Informed written consent was obtained from the patient's family after a thorough discussion of the procedural risks, benefits and alternatives. Specific risks discussed include: Bleeding, infection, contrast reaction, kidney injury/failure, need for further procedure/surgery, arterial injury or dissection, embolization to new territory, intracranial hemorrhage (10-15% risk), neurologic deterioration, cardiopulmonary collapse, death. All questions were addressed. Maximal Sterile Barrier Technique was utilized including during the procedure including caps, mask, sterile gowns, sterile gloves, sterile drape, hand hygiene and skin  antiseptic. A timeout was performed prior to the initiation of the procedure. The anesthesia team was present to provide general endotracheal tube anesthesia and for patient monitoring during the procedure. Interventional neuro radiology nursing staff was also present. FINDINGS: Initial Findings: Left common carotid artery:  Normal course caliber and contour. Left external carotid artery: Patent with antegrade flow. Left internal carotid artery: Normal course caliber and contour of the cervical portion. Vertical and petrous segment patent with normal course caliber contour. Cavernous segment patent. Clinoid segment patent. Antegrade flow of the ophthalmic artery. Ophthalmic segment patent. Terminus patent. Left MCA:  Proximal M1 segment patent Early temporal branch, supplying the left temporal lobe. Early angiographic image demonstrates significant leptomeningeal collateral flow into the MCA territory from the [facility] territory and the patent temporal branches. Distal M1 occlusion. Left ACA: A 1 segment patent. A 2 segment perfuses the right territory. Patent anterior communicating artery. Leptomeningeal collaterals from the [place] territory through the watershed ANCA territory. Completion Findings: Left MCA: Restoration of fluoro through the MCA after mechanical thrombectomy. TICI 3 flow restored Flat panel CT demonstrates no subarachnoid hemorrhage. PROCEDURE: Patient is brought emergently to the neuro angiography suite, with the patient identified appropriately and placed supine position on the table. Left radial arterial line was placed by the anesthesia team. The patient is then prepped and draped in the usual sterile fashion. Ultrasound survey of the right inguinal region was performed with images stored and sent to PACs. 11 blade scalpel was used to make a small incision. Blunt dissection was performed. A micropuncture needle was used access the right common femoral artery under ultrasound. With excellent arterial  blood flow returned, an .018 micro wire was passed through the needle, observed to enter the abdominal aorta under fluoroscopy. The needle was removed, and a micropuncture sheath was placed over the wire. The inner dilator and wire were removed, and an  035 Bentson wire was advanced under fluoroscopy into the abdominal aorta. The sheath was removed and a standard 5 [place] vascular sheath was placed. The dilator was removed and the sheath was flushed. A 38F JB-1 diagnostic catheter was advanced over the wire to the proximal descending thoracic aorta. Wire was then removed. Double flush of the catheter was performed. Catheter was then used to select the left common carotid artery. Formal angiogram was performed demonstrating somewhat tortuous common carotid artery. Standard Glidewire and the JB 1 catheter were used to navigate to the bifurcation. Wire was removed and angiogram was performed. The roadrunner wire was then used to navigate the JB 1 catheter to the petrous segment. Wire was removed. Exchange length Rosen wire was then passed through the diagnostic catheter to the petrous ICA and the diagnostic catheter was removed. The 5 French sheath was removed and exchanged for 8 French 55 centimeter BrightTip sheath. Sheath was flushed and attached to pressurized and heparinized saline bag for constant forward flow. At this point we attempted to pass the 95 cm [place] balloon guide catheter through the bright tip sheath. Multiple attempts using the introducer and without the introducer failed, and we ultimately decided that there was a mismatch, perhaps from quality control of the sheath. We then elected to use a flow gait balloon catheter. Eight French 95 cm flow gate balloon catheter was then advanced over the wire to the distal cervical segment. Wire was removed. Then a coaxial intermediate catheter and microcatheter combination was prepared on the back table. This combination was CAT-5 catheter and a Trevo TRK  microcatheter, with a synchro soft wire. This combination was then advanced through the balloon guide into the ICA. System was advanced into the internal carotid artery, to the level of the occlusion. The micro wire was then carefully advanced through the occluded segment. Microcatheter was then pushed through the occluded segment and the wire was removed. Intermediate catheter was advanced to the distal balloon guide. Blood was then aspirated through the hub of the microcatheter, and a gentle contrast injection was performed confirming intraluminal position. A rotating hemostatic valve was then attached to the back end of the microcatheter, and a pressurized and heparinized saline bag was attached to the catheter. 37m x 372mTrevo NXT device was then selected. Back flush was achieved at the rotating hemostatic valve, and then the device was gently advanced through the microcatheter to the distal end. The retriever was then unsheathed by withdrawing the microcatheter under fluoroscopy. Once the retriever was completely unsheathed, the microcatheter was carefully stripped from the delivery wire of the device. 3 minute time interval was observed. The balloon on the balloon guide was then inflated to profile of the vessel. Constant suction aspiration was then performed through the intermediate catheter, and constant gentle aspiration was performed at the balloon guide. This aspiration was continued as the retriever was gently and slowly withdrawn with fluoroscopic observation into the distal intermediate catheter. The entire system was then gently withdrawn from the intracranial ICA and into the balloon guide. Once the retriever was entirely removed from the system, free aspiration was confirmed at the hub of the balloon guide, with free blood return confirmed. Control angiogram was then performed, confirming restoration of flow. Balloon guide was withdrawn with angiogram performed of the cervical ICA. Balloon guide  was removed, and the bright tip sheath was removed and a wire with Angio-Seal deployed. Pulses were confirmed bilateral lower extremity. Patient tolerated the procedure well and remained hemodynamically stable throughout.  No complications were encountered. Estimated blood loss approximately 75 cc. IMPRESSION: Status post ultrasound guided access right common femoral artery for left-sided cervical and cerebral angiogram and mechanical thrombectomy of left M1 occlusion via single pass of Trevo 3x32 NXT device. Angio-Seal deployed for hemostasis. Signed, Dulcy Fanny. Dellia Nims, RPVI [facility] Radiology PLAN: Admit to ICU Patient was extubated in the room Blood pressure target of 389 systolic Right hip straight times 8 hours, status post Angio-Seal deployment. Electronically Signed   By: Corrie Mckusick D.O.   On: 03/04/2019 01:25   CT HEAD CODE STROKE WO CONTRAST`  Result Date: 03/03/2019 CLINICAL DATA:  Code stroke. Initial evaluation for acute stroke, facial droop. EXAM: CT HEAD WITHOUT CONTRAST TECHNIQUE: Contiguous axial images were obtained from the base of the skull through the vertex without intravenous contrast. COMPARISON:  None available. FINDINGS: Brain: Generalized age-related cerebral atrophy with moderate chronic microvascular ischemic disease. No acute intracranial hemorrhage. Subtle hypodensity seen involving the left caudate, concerning for possible evolving left MCA territory infarct. Gray-white matter differentiation otherwise grossly maintained without definite discernible evidence for evolving ischemia. No mass lesion or midline shift. No hydrocephalus. No extra-axial fluid collection. Vascular: Apparent hyperdensity noted involving the distal left M1 and/or proximal M2 branch (a series 6, image 45), concerning for possible LVO. Calcified atherosclerosis present at the skull base. Skull: Scalp soft tissues and calvarium within normal limits.  Sinuses/Orbits: Globes and orbital soft tissues within normal limits. Sequelae of prior sinus surgery noted. Chronic mucosal thickening noted within the maxillary sinuses, ethmoidal air cells, and right sphenoid sinus. Mastoid air cells are clear. Other: None. ASPECTS [facility] Stroke Program Early CT Score) - Ganglionic level infarction (caudate, lentiform nuclei, internal capsule, insula, M1-M3 cortex): 6 - Supraganglionic infarction (M4-M6 cortex): 3 Total score (0-10 with 10 being normal): 9 IMPRESSION: 1. Asymmetric hyperdensity involving the distal left M1 and/or proximal M2 branch, concerning for LVO. Subtle hypodensity involving the left caudate concerning for evolving left MCA territory ischemia. No intracranial hemorrhage. 2. ASPECTS is 9. 3. Underlying age-related cerebral atrophy with moderate chronic small vessel ischemic disease. These results were communicated to Dr. Cheral Marker At 10:58 pmon 1/2/2021by text page via the [facility] messaging system. Electronically Signed   By: Jeannine Boga M.D.   On: 03/03/2019 23:02    12-lead ECG 03/01/2019 shows NSR at 84 bpm with PACs (personally reviewed) All prior EKG's in EPIC reviewed with no documented atrial fibrillation  Telemetry NSR 60-70s (personally reviewed)  Assessment and Plan:  1. Cryptogenic stroke The patient presents with cryptogenic stroke.  The patient does have a TEE planned for this AM.  I spoke at length with the patient about monitoring for afib with an implantable loop recorder.  Risks, benefits, and alteratives to implantable loop recorder were discussed with the patient today.   At this time, the patient is very clear in their decision to proceed with implantable loop recorder pending TEE results.    Wound care was reviewed with the patient (keep incision clean and dry for 3 days).  Wound check to be scheduled and entered in AVS pending TEE results. Please call with questions.   Shirley Friar, PA-C 03/07/2019 7:17  AM  I have seen and examined this patient with Oda Kilts.  Agree with above, note added to reflect my findings.  On exam, RRR, no murmurs, lungs clear.  Patient presented to the hospital with cryptogenic stroke. To date, no cause has been found. Evah Rashid plan for [facility]  monitor to look for atrial fibrillation. Risks and benefits discussed. Risks include but not limited to bleeding and infection. The patient understands the risks and has agreed to the procedure.  Ishaan Villamar M. Brydon Spahr MD 03/07/2019 11:38 AM

## 2019-03-07 NOTE — Progress Notes (Signed)
Verbal order by Dr. Estanislado Pandy to obtain ultrasound of right leg to rule out pseudo aneurysm before patient is able to tolerate movement with TEE. Echo department called and made aware. [place] ordered stat per PA.  Verbal order by Dr. Leonie Man to hold Lovenox, ASA and Plavix until right leg ultrasound is complete.

## 2019-03-07 NOTE — Sedation Documentation (Signed)
Report called to 4N RN no sedation given pt transferred to 4N via bed with RN and tech

## 2019-03-07 NOTE — Progress Notes (Addendum)
Chief Complaint: Patient was seen in consultation today for right CFA pseudoaneurysm/thrombin injection.  Referring Physician(s): Code Stroke- Kerney Elbe  Supervising Physician: Jacqulynn Cadet  Patient Status: [facility] - In-pt  History of Present Illness: Jimmy Jimenez is a 86 y.o. male with a past medical history of hypertension, hyperlipidemia, CVA 03/2019, pulmonary fibrosis, lung nodule, thrombocytosis, and osteoporosis. He has been admitted to [facility] since 03/03/2019 for management of an acute CVA secondary to left MCA M1 segment occlusion. He underwent an image-guided cerebral arteriogram with emergent mechanical thrombectomy of left MCA M1 segment occlusion via right femoral approach achieving a TICI 3 revascularization 03/02/2018 by Dr. Earleen Newport. The day following procedure, patient's right groin was soft without active bleeding or hematoma. Per RN, last evening patient with notable oozing from right groin after walking to and from bathroom. Apparently when he was back in bed, his bed was saturated with blood and clots. Oozing stopped at approximately 2100 last evening per chart, pressure dressing was applied, and patient to lay flat in bed until this AM per Dr. Estanislado Pandy. VAS [place] was obtained which revealed a pseudoaneurysm of right CFA. Results were reviewed by Dr. Earleen Newport and Dr. Laurence Ferrari who recommend thrombin injection of pseudoaneurysm.  Patient presents today for possible image-guided hrombin injection of right CFA pseudoaneurysm. Patient awake and alert laying in bed. Accompanied by wife at bedside. Complains of tenderness of right groin incision, as expected. Denies fever, chills, chest pain, dyspnea, abdominal pain, or headache.   Past Medical History:  Diagnosis Date  . Colon polyps   . Essential thrombocytosis ([place])   . Essential thrombocytosis ([place])   . Hyperlipidemia   . Hypertension   . Lung nodule 12/2004   found on CXR  . Osteoporosis     Past Surgical History:    Procedure Laterality Date  . BONE MARROW BIOPSY  01/18/07  . COLONOSCOPY  2007, 2010   Dr Allen Norris  . IR CT HEAD LTD  03/04/2019  . IR PERCUTANEOUS ART THROMBECTOMY/INFUSION INTRACRANIAL INC DIAG ANGIO  03/04/2019  . IR US GUIDE VASC ACCESS RIGHT  03/04/2019  . NASAL SINUS SURGERY  06/11/05  . RADIOLOGY WITH ANESTHESIA N/A 03/03/2019   Procedure: IR WITH ANESTHESIA;  Surgeon: Luanne Bras, MD;  Location: [facility];  Service: Radiology;  Laterality: N/A;    Allergies: Amlodipine, Atenolol, Erythromycin, Furosemide, Hctz [hydrochlorothiazide], Hydrochlorothiazide w-triamterene, Levofloxacin, Metoprolol, Micardis [telmisartan], Penicillins, Terazosin, and Triamterene-hctz  Medications: Prior to Admission medications   Medication Sig Start Date End Date Taking? Authorizing Provider  albuterol (VENTOLIN HFA) 108 (90 Base) MCG/ACT inhaler Inhale 2 puffs into the lungs every 6 (six) hours as needed for wheezing or shortness of breath. 03/03/19  Yes Wieting, Richard, MD  amLODipine (NORVASC) 5 MG tablet TAKE 1 TABLET BY MOUTH ONCE DAILY 12/26/18  Yes Crecencio Mc, MD  aspirin EC 81 MG tablet Take 81 mg by mouth daily. Reported on 08/14/2015   Yes [provider]  azelastine (ASTELIN) 0.1 % nasal spray Place 2 sprays into both nostrils 2 (two) times daily.  02/10/19  Yes [provider]  beclomethasone (QVAR REDIHALER) 40 MCG/ACT inhaler Inhale 1 puff into the lungs 2 (two) times daily. 03/03/19  Yes Wieting, Richard, MD  benzonatate (TESSALON) 200 MG capsule Take 1 capsule (200 mg total) by mouth 2 (two) times daily as needed for cough. 01/04/19  Yes Crecencio Mc, MD  Calcium Citrate-Vitamin D (CITRACAL + D PO) Take 2 tablets by mouth daily. Take 2 by mouth daily,  calcium 630 and vitamin D 500 each.    Yes [provider]  Cholecalciferol (VITAMIN D3) 1000 UNITS CAPS Take 2,000 Units by mouth daily. Take 2 by mouth daily   Yes [provider]  clotrimazole-betamethasone  (LOTRISONE) cream Apply topically 2 (two) times daily. 11/28/18  Yes Crecencio Mc, MD  Cyanocobalamin (B-12 COMPLIANCE INJECTION) 1000 MCG/ML KIT Inject 1,000 mcg as directed every 30 (thirty) days.    Yes [provider]  cyanocobalamin 1000 MCG tablet Take 5,000 mcg by mouth daily.   Yes [provider]  docusate sodium (COLACE) 100 MG capsule Take 100 mg by mouth 2 (two) times daily as needed for mild constipation.   Yes [provider]  esomeprazole (NEXIUM) 40 MG capsule TAKE 1 CAPSULE BY MOUTH ONCE DAILY AT NOON Patient taking differently: Take 40 mg by mouth daily.  11/27/18  Yes Crecencio Mc, MD  furosemide (LASIX) 20 MG tablet TAKE 1 TABLET BY MOUTH EVERY OTHER DAY IN MORNING Patient taking differently: Take 20 mg by mouth every other day. TAKE 1 TABLET BY MOUTH EVERY OTHER DAY IN MORNING 10/31/18  Yes Crecencio Mc, MD  hydrALAZINE (APRESOLINE) 50 MG tablet TAKE 1 TABLET BY MOUTH 3 TIMES DAILY 02/26/19  Yes Crecencio Mc, MD  hydroxyurea (HYDREA) 500 MG capsule TAKE 1 CAPSULE BY MOUTH EVERY OTHER DAY MAY TAKE WITH FOOD TO MINIMIZE GI SIDE EFFECTS. 08/08/18  Yes Cammie Sickle, MD  Infant Care Products ([place]) CREA Apply 1 application topically as needed (after each bowel movement for barrier for  pressure ulcer.). 02/15/19  Yes Crecencio Mc, MD  lisinopril (ZESTRIL) 40 MG tablet TAKE 1 TABLET BY MOUTH ONCE DAILY 09/15/18  Yes Crecencio Mc, MD  doxycycline (VIBRA-TABS) 100 MG tablet Take 1 tablet (100 mg total) by mouth every 12 (twelve) hours for 6 days. 03/03/19 03/09/19  Loletha Grayer, MD  testosterone cypionate (DEPOTESTOSTERONE CYPIONATE) 200 MG/ML injection INJECT 0.25 ML INTRAMUSCULARLY EVERY 14 DAYS AS DIRECTED 02/16/19   Crecencio Mc, MD     Family History  Problem Relation Age of Onset  . Colon cancer Mother        colon age 81's  . Peripheral vascular disease Father   . Colon cancer Brother        colon age 13's  . Diabetes  Other   . Prostate cancer Neg Hx   . Bladder Cancer Neg Hx   . Kidney cancer Neg Hx     Social History   Socioeconomic History  . Marital status: Married    Spouse name: Not on file  . Number of children: Not on file  . Years of education: Not on file  . Highest education level: Not on file  Occupational History  . Occupation: Retired    Comment: former Nature conservation officer, [employer]  Tobacco Use  . Smoking status: Never Smoker  . Smokeless tobacco: Never Used  Substance and Sexual Activity  . Alcohol use: No  . Drug use: No  . Sexual activity: Not Currently  Other Topics Concern  . Not on file  Social History Narrative  . Not on file   Social Determinants of Health   Financial Resource Strain:   . Difficulty of Paying Living Expenses: Not on file  Food Insecurity:   . Worried About Charity fundraiser in the Last Year: Not on file  . Ran Out of Food in the Last Year: Not on file  Transportation Needs:   .  Lack of Transportation (Medical): Not on file  . Lack of Transportation (Non-Medical): Not on file  Physical Activity:   . Days of Exercise per Week: Not on file  . Minutes of Exercise per Session: Not on file  Stress:   . Feeling of Stress : Not on file  Social Connections:   . Frequency of Communication with Friends and Family: Not on file  . Frequency of Social Gatherings with Friends and Family: Not on file  . Attends Religious Services: Not on file  . Active Member of Clubs or Organizations: Not on file  . Attends Archivist Meetings: Not on file  . Marital Status: Not on file     Review of Systems: A 12 point ROS discussed and pertinent positives are indicated in the HPI above.  All other systems are negative.  Review of Systems  Constitutional: Negative for chills and fever.  Respiratory: Negative for shortness of breath and wheezing.   Cardiovascular: Negative for chest pain and palpitations.  Gastrointestinal: Negative for abdominal pain.    Neurological: Negative for headaches.  Psychiatric/Behavioral: Negative for behavioral problems and confusion.    Vital Signs: BP (!) 174/65   Pulse 74   Temp 97.9 F (36.6 C) (Oral)   Resp 18   Ht 5' 8"  (1.727 m)   Wt 138 lb 0.1 oz (62.6 kg)   SpO2 96%   BMI 20.98 kg/m   Physical Exam Vitals and nursing note reviewed.  Constitutional:      General: He is not in acute distress.    Appearance: Normal appearance.  Cardiovascular:     Rate and Rhythm: Normal rate and regular rhythm.     Heart sounds: Normal heart sounds. No murmur.  Pulmonary:     Effort: Pulmonary effort is normal. No respiratory distress.     Breath sounds: Normal breath sounds. No wheezing.  Skin:    General: Skin is warm and dry.     Comments: Right groin incision well dressed with pressure dressing, with mild tenderness to palpation and an approximately 2 cm firmness at superior aspect of insertion site, no active bleeding or drainage noted. Distal pulses 2+ bilaterally, right femoral pulse 2+.  Neurological:     Mental Status: He is alert and oriented to person, place, and time.  Psychiatric:        Mood and Affect: Mood normal.        Behavior: Behavior normal.      MD Evaluation Airway: WNL Heart: WNL Abdomen: WNL Chest/ Lungs: WNL ASA  Classification: 2 Mallampati/Airway Score: Two   Imaging: CT Code Stroke CTA Head W/WO contrast  Result Date: 03/03/2019 CLINICAL DATA:  Initial evaluation for acute stroke. EXAM: CT ANGIOGRAPHY HEAD AND NECK CT PERFUSION BRAIN TECHNIQUE: Multidetector CT imaging of the head and neck was performed using the standard protocol during bolus administration of intravenous contrast. Multiplanar CT image reconstructions and MIPs were obtained to evaluate the vascular anatomy. Carotid stenosis measurements (when applicable) are obtained utilizing NASCET criteria, using the distal internal carotid diameter as the denominator. Multiphase CT imaging of the brain was  performed following IV bolus contrast injection. Subsequent parametric perfusion maps were calculated using RAPID software. CONTRAST:  139m OMNIPAQUE IOHEXOL 350 MG/ML SOLN COMPARISON:  Prior noncontrast head CT from earlier same day. FINDINGS: CTA NECK FINDINGS Aortic arch: Visualized aortic arch of normal caliber with normal branch pattern. Moderate atherosclerotic change about the arch and origin of the great vessels without hemodynamically significant stenosis. Visualized  subclavian arteries patent. Right carotid system: Right common and internal carotid arteries are mildly tortuous but widely patent without significant stenosis, dissection or occlusion. Left carotid system: Left common and internal carotid arteries are mildly tortuous but widely patent without significant stenosis, dissection or occlusion. Vertebral arteries: Both vertebral arteries arise from the subclavian arteries. Focal plaque at the origin of the right vertebral artery with associated 50% stenosis. Focal non stenotic plaque noted at the origin of the left vertebral artery as well. Vertebral arteries tortuous but otherwise patent within the neck without stenosis, dissection, or occlusion. Skeleton: No acute osseous abnormality. No worrisome lytic or blastic osseous lesions. Moderate multilevel cervical spondylosis without significant stenosis. Other neck: No other acute soft tissue abnormality within the neck. No mass lesion or adenopathy. Upper chest: Mildly enlarged and partially calcified 12 mm precarinal lymph node noted. Patchy multifocal ground-glass opacities seen within the partially visualized lungs, similar to previous. Superimposed scattered subpleural reticular densities and fibrotic changes. Superimposed 9 mm nodular density at the posterior left upper lobe (series 5, image 169). Additional 9 mm pleural base nodular density partially visualized at the anterior right upper lobe (series 5, image 189). Review of the MIP images  confirms the above findings CTA HEAD FINDINGS Anterior circulation: Scattered calcified plaque throughout the petrous, cavernous, and supraclinoid ICAs with associated moderate to severe multifocal narrowing. A1 segments patent. Normal anterior communicating artery. Anterior cerebral arteries widely patent to their distal aspects without stenosis. Left M1 patent. There is occlusive thrombus within the proximal left M2 branch, superior division. Attenuated collateral flow seen distally. Anterior temporal/inferior division remains patent. Right M1 widely patent. Normal right MCA bifurcation. Short-segment severe stenosis at the origin of the right M2 inferior division. Right MCA branches well perfused distally. Posterior circulation: Calcified atheromatous plaque within the dominant left V4 segment with associated moderate multifocal stenosis. Diminutive right vertebral artery widely patent the vertebrobasilar junction. Posterior inferior cerebral arteries patent bilaterally. Basilar widely patent to its distal aspect. Superior cerebral arteries patent bilaterally. Both PCAs primarily supplied via the basilar. Short-segment severe distal left P2 stenosis noted. PCAs otherwise patent to their distal aspects without stenosis. Venous sinuses: Patent. Anatomic variants: None significant. Review of the MIP images confirms the above findings CT Brain Perfusion Findings: ASPECTS: 9 CBF (<30%) Volume: 36m Perfusion (Tmax>6.0s) volume: 543mMismatch Volume: 4470mnfarction Location:Patchy small volume acute core infarct seen involving the deep white matter of the left cerebral hemisphere, somewhat watershed in distribution. Surrounding ischemic penumbra of 55 cc, with mismatch volume of 44 cc. IMPRESSION: CTA HEAD AND NECK IMPRESSION: 1. Positive CTA for LVO with occlusive thrombus within a proximal left M2 branch, superior division. 2. Heavy calcified plaque throughout the carotid siphons with associated moderate to severe  multifocal narrowing. 3. Short-segment severe proximal right M2 and distal left P2 stenoses. 4. Approximate 50% atheromatous stenoses at the origin of the right vertebral artery as well as involving the left V4 segment. CT PERFUSION IMPRESSION: 1. 11 cc acute core infarct involving the deep white matter of the left cerebral hemisphere, watershed in distribution. Surrounding 55 cc ischemic penumbra involving much of the left MCA. 2. Aspects equals 9 on prior head CT. These results were communicated to Dr. LinCheral Marker 11:22 pmon 1/2/2021by text page via the [facility]aging system. Electronically Signed   By: BenJeannine BogaD.   On: 03/03/2019 23:44   DG Chest 2 View  Result Date: 03/01/2019 CLINICAL DATA:  Shortness of breath, cough EXAM: CHEST - 2 VIEW  COMPARISON:  09/28/2018 FINDINGS: Stable cardiomediastinal contours. Calcified thoracic aorta. Chronic interstitial lung changes without definite new superimposed airspace opacity. No pleural effusion or pneumothorax. IMPRESSION: Chronic interstitial lung changes without definite superimposed airspace opacity Electronically Signed   By: Davina Poke D.O.   On: 03/01/2019 16:48   CT Code Stroke CTA Neck W/WO contrast  Result Date: 03/03/2019 CLINICAL DATA:  Initial evaluation for acute stroke. EXAM: CT ANGIOGRAPHY HEAD AND NECK CT PERFUSION BRAIN TECHNIQUE: Multidetector CT imaging of the head and neck was performed using the standard protocol during bolus administration of intravenous contrast. Multiplanar CT image reconstructions and MIPs were obtained to evaluate the vascular anatomy. Carotid stenosis measurements (when applicable) are obtained utilizing NASCET criteria, using the distal internal carotid diameter as the denominator. Multiphase CT imaging of the brain was performed following IV bolus contrast injection. Subsequent parametric perfusion maps were calculated using RAPID software. CONTRAST:  162m OMNIPAQUE IOHEXOL 350 MG/ML SOLN  COMPARISON:  Prior noncontrast head CT from earlier same day. FINDINGS: CTA NECK FINDINGS Aortic arch: Visualized aortic arch of normal caliber with normal branch pattern. Moderate atherosclerotic change about the arch and origin of the great vessels without hemodynamically significant stenosis. Visualized subclavian arteries patent. Right carotid system: Right common and internal carotid arteries are mildly tortuous but widely patent without significant stenosis, dissection or occlusion. Left carotid system: Left common and internal carotid arteries are mildly tortuous but widely patent without significant stenosis, dissection or occlusion. Vertebral arteries: Both vertebral arteries arise from the subclavian arteries. Focal plaque at the origin of the right vertebral artery with associated 50% stenosis. Focal non stenotic plaque noted at the origin of the left vertebral artery as well. Vertebral arteries tortuous but otherwise patent within the neck without stenosis, dissection, or occlusion. Skeleton: No acute osseous abnormality. No worrisome lytic or blastic osseous lesions. Moderate multilevel cervical spondylosis without significant stenosis. Other neck: No other acute soft tissue abnormality within the neck. No mass lesion or adenopathy. Upper chest: Mildly enlarged and partially calcified 12 mm precarinal lymph node noted. Patchy multifocal ground-glass opacities seen within the partially visualized lungs, similar to previous. Superimposed scattered subpleural reticular densities and fibrotic changes. Superimposed 9 mm nodular density at the posterior left upper lobe (series 5, image 169). Additional 9 mm pleural base nodular density partially visualized at the anterior right upper lobe (series 5, image 189). Review of the MIP images confirms the above findings CTA HEAD FINDINGS Anterior circulation: Scattered calcified plaque throughout the petrous, cavernous, and supraclinoid ICAs with associated moderate  to severe multifocal narrowing. A1 segments patent. Normal anterior communicating artery. Anterior cerebral arteries widely patent to their distal aspects without stenosis. Left M1 patent. There is occlusive thrombus within the proximal left M2 branch, superior division. Attenuated collateral flow seen distally. Anterior temporal/inferior division remains patent. Right M1 widely patent. Normal right MCA bifurcation. Short-segment severe stenosis at the origin of the right M2 inferior division. Right MCA branches well perfused distally. Posterior circulation: Calcified atheromatous plaque within the dominant left V4 segment with associated moderate multifocal stenosis. Diminutive right vertebral artery widely patent the vertebrobasilar junction. Posterior inferior cerebral arteries patent bilaterally. Basilar widely patent to its distal aspect. Superior cerebral arteries patent bilaterally. Both PCAs primarily supplied via the basilar. Short-segment severe distal left P2 stenosis noted. PCAs otherwise patent to their distal aspects without stenosis. Venous sinuses: Patent. Anatomic variants: None significant. Review of the MIP images confirms the above findings CT Brain Perfusion Findings: ASPECTS: 9 CBF (<30%) Volume: 137m  Perfusion (Tmax>6.0s) volume: 60m Mismatch Volume: 469mInfarction Location:Patchy small volume acute core infarct seen involving the deep white matter of the left cerebral hemisphere, somewhat watershed in distribution. Surrounding ischemic penumbra of 55 cc, with mismatch volume of 44 cc. IMPRESSION: CTA HEAD AND NECK IMPRESSION: 1. Positive CTA for LVO with occlusive thrombus within a proximal left M2 branch, superior division. 2. Heavy calcified plaque throughout the carotid siphons with associated moderate to severe multifocal narrowing. 3. Short-segment severe proximal right M2 and distal left P2 stenoses. 4. Approximate 50% atheromatous stenoses at the origin of the right vertebral artery  as well as involving the left V4 segment. CT PERFUSION IMPRESSION: 1. 11 cc acute core infarct involving the deep white matter of the left cerebral hemisphere, watershed in distribution. Surrounding 55 cc ischemic penumbra involving much of the left MCA. 2. Aspects equals 9 on prior head CT. These results were communicated to Dr. LiCheral Markert 11:22 pmon 1/2/2021by text page via the [facility]aging system. Electronically Signed   By: BeJeannine Boga.D.   On: 03/03/2019 23:44   CT Angio Chest PE W and/or Wo Contrast  Result Date: 03/02/2019 CLINICAL DATA:  Shortness of breath. EXAM: CT ANGIOGRAPHY CHEST WITH CONTRAST TECHNIQUE: Multidetector CT imaging of the chest was performed using the standard protocol during bolus administration of intravenous contrast. Multiplanar CT image reconstructions and MIPs were obtained to evaluate the vascular anatomy. CONTRAST:  7554mMNIPAQUE IOHEXOL 350 MG/ML SOLN COMPARISON:  Radiograph yesterday. Chest CT 04/20/2016 had an outside institution. FINDINGS: Cardiovascular: There are no filling defects within the pulmonary arteries to suggest pulmonary embolus. Mild prominence of the left and right main pulmonary arteries measuring 2.7 and 2.8 cm respectively. Aortic atherosclerosis and tortuosity. No aortic aneurysm or dissection. Coronary artery calcifications. Normal heart size with slight right heart dilatation. Contrast refluxes into the hepatic veins and IVC. Mediastinum/Nodes: Calcified mediastinal and hilar lymph nodes. No noncalcified adenopathy. No esophageal wall thickening. No suspicious thyroid nodule. Lungs/Pleura: Bilateral subpleural reticulation in a basilar predominant distribution, similar to prior exam. Slight increased density within the dependent right greater than left lower lobe reticulation may represent pulmonary microlithiasis, this is also unchanged. No acute airspace disease. No pulmonary edema or pleural effusion. Trachea and mainstem bronchi are  patent. No pulmonary mass. Upper Abdomen: Contrast refluxes into the hepatic veins and IVC. No other acute finding. Musculoskeletal: There are no acute or suspicious osseous abnormalities. Review of the MIP images confirms the above findings. IMPRESSION: 1. No pulmonary embolus. 2. Contrast reflux into the hepatic veins and IVC, suggesting elevated right heart pressures. 3. Unchanged chronic interstitial lung disease since 2018, likely pulmonary fibrosis/UIP. Aortic Atherosclerosis (ICD10-I70.0). Electronically Signed   By: MelKeith RakeD.   On: 03/02/2019 03:06   MR BRAIN WO CONTRAST  Result Date: 03/04/2019 CLINICAL DATA:  Initial evaluation for acute stroke, status post tPA. EXAM: MRI HEAD WITHOUT CONTRAST TECHNIQUE: Multiplanar, multiecho pulse sequences of the brain and surrounding structures were obtained without intravenous contrast. COMPARISON:  Prior CTs from 03/04/2019. FINDINGS: Brain: Generalized age-related cerebral atrophy. Patchy T2/FLAIR hyperintensity within the periventricular and deep white matter both cerebral hemispheres most consistent with chronic small vessel ischemic disease, mild for age. Few scattered small remote bilateral cerebellar infarcts noted. Patchy restricted diffusion seen involving the left caudate and lentiform nuclei, consistent with acute left MCA territory infarcts. Single additional subcentimeter cortical infarct noted at the adjacent left frontal operculum. No associated hemorrhage or mass effect. No other evidence for acute or subacute  ischemia. Gray-white matter differentiation otherwise maintained. No other areas of remote cortical infarction. No foci of susceptibility artifact to suggest acute or chronic intracranial hemorrhage. No mass lesion, midline shift or mass effect. No hydrocephalus. No extra-axial fluid collection. Pituitary gland within normal limits. Vascular: Major intracranial vascular flow voids are maintained. Skull and upper cervical spine:  Craniocervical junction normal. Bone marrow signal intensity within normal limits. No scalp soft tissue abnormality. Sinuses/Orbits: Patient status post ocular lens replacement on the left. Globes and orbital soft tissues demonstrate no acute finding. Scattered mucosal thickening noted throughout the paranasal sinuses, most notable within the right sphenoid sinus. Trace left mastoid effusion noted, of doubtful significance. Inner ear structures grossly normal. Other: None. IMPRESSION: 1. Acute ischemic left MCA territory infarcts primarily involving the left caudate and lentiform nuclei. No associated hemorrhage or mass effect. 2. No other acute intracranial abnormality. 3. Age-related cerebral atrophy with mild chronic small vessel ischemic disease. Electronically Signed   By: Jeannine Boga M.D.   On: 03/04/2019 23:01   IR CT Head Ltd  Result Date: 03/04/2019 INDICATION: 86 year old male with a history of acute right-sided symptoms and identification left MCA emergent large vessel occlusion EXAM: ULTRASOUND GUIDED ACCESS RIGHT COMMON FEMORAL ARTERY CERVICAL AND CEREBRAL ANGIOGRAM MECHANICAL THROMBECTOMY OF LEFT MCA DEPLOYMENT OF ANGIO-SEAL FOR HEMOSTASIS COMPARISON:  CT IMAGING SAME DAY MEDICATIONS: 1 g vancomycin. The antibiotic was administered within 1 hour of the procedure ANESTHESIA/SEDATION: The anesthesia team was present to provide general endotracheal tube anesthesia and for patient monitoring during the procedure. Interventional neuro radiology nursing staff was also present. The patient was also continuously cared for during the procedure by the interventional radiology nurse under my direct supervision. CONTRAST:  50 cc IV contrast FLUOROSCOPY TIME:  Fluoroscopy Time: 14 minutes 24 seconds (1,007 mGy). COMPLICATIONS: None immediate. TECHNIQUE: Informed written consent was obtained from the patient after a thorough discussion of the procedural risks, benefits and alternatives. All questions  were addressed. Maximal Sterile Barrier Technique was utilized including caps, mask, sterile gowns, sterile gloves, sterile drape, hand hygiene and skin antiseptic. A timeout was performed prior to the initiation of the procedure. Informed written consent was obtained from the patient's family after a thorough discussion of the procedural risks, benefits and alternatives. Specific risks discussed include: Bleeding, infection, contrast reaction, kidney injury/failure, need for further procedure/surgery, arterial injury or dissection, embolization to new territory, intracranial hemorrhage (10-15% risk), neurologic deterioration, cardiopulmonary collapse, death. All questions were addressed. Maximal Sterile Barrier Technique was utilized including during the procedure including caps, mask, sterile gowns, sterile gloves, sterile drape, hand hygiene and skin antiseptic. A timeout was performed prior to the initiation of the procedure. The anesthesia team was present to provide general endotracheal tube anesthesia and for patient monitoring during the procedure. Interventional neuro radiology nursing staff was also present. FINDINGS: Initial Findings: Left common carotid artery:  Normal course caliber and contour. Left external carotid artery: Patent with antegrade flow. Left internal carotid artery: Normal course caliber and contour of the cervical portion. Vertical and petrous segment patent with normal course caliber contour. Cavernous segment patent. Clinoid segment patent. Antegrade flow of the ophthalmic artery. Ophthalmic segment patent. Terminus patent. Left MCA:  Proximal M1 segment patent Early temporal branch, supplying the left temporal lobe. Early angiographic image demonstrates significant leptomeningeal collateral flow into the MCA territory from the [facility] territory and the patent temporal branches. Distal M1 occlusion. Left ACA: A 1 segment patent. A 2 segment perfuses the right territory. Patent anterior  communicating  artery. Leptomeningeal collaterals from the [place] territory through the watershed ANCA territory. Completion Findings: Left MCA: Restoration of fluoro through the MCA after mechanical thrombectomy. TICI 3 flow restored Flat panel CT demonstrates no subarachnoid hemorrhage. PROCEDURE: Patient is brought emergently to the neuro angiography suite, with the patient identified appropriately and placed supine position on the table. Left radial arterial line was placed by the anesthesia team. The patient is then prepped and draped in the usual sterile fashion. Ultrasound survey of the right inguinal region was performed with images stored and sent to PACs. 11 blade scalpel was used to make a small incision. Blunt dissection was performed. A micropuncture needle was used access the right common femoral artery under ultrasound. With excellent arterial blood flow returned, an .018 micro wire was passed through the needle, observed to enter the abdominal aorta under fluoroscopy. The needle was removed, and a micropuncture sheath was placed over the wire. The inner dilator and wire were removed, and an 035 Bentson wire was advanced under fluoroscopy into the abdominal aorta. The sheath was removed and a standard 5 [place] vascular sheath was placed. The dilator was removed and the sheath was flushed. A 76F JB-1 diagnostic catheter was advanced over the wire to the proximal descending thoracic aorta. Wire was then removed. Double flush of the catheter was performed. Catheter was then used to select the left common carotid artery. Formal angiogram was performed demonstrating somewhat tortuous common carotid artery. Standard Glidewire and the JB 1 catheter were used to navigate to the bifurcation. Wire was removed and angiogram was performed. The roadrunner wire was then used to navigate the JB 1 catheter to the petrous segment. Wire was removed. Exchange length Rosen wire was then passed through the diagnostic catheter  to the petrous ICA and the diagnostic catheter was removed. The 5 French sheath was removed and exchanged for 8 French 55 centimeter BrightTip sheath. Sheath was flushed and attached to pressurized and heparinized saline bag for constant forward flow. At this point we attempted to pass the 95 cm Walrus balloon guide catheter through the bright tip sheath. Multiple attempts using the introducer and without the introducer failed, and we ultimately decided that there was a mismatch, perhaps from quality control of the sheath. We then elected to use a flow gait balloon catheter. Eight French 95 cm flow gate balloon catheter was then advanced over the wire to the distal cervical segment. Wire was removed. Then a coaxial intermediate catheter and microcatheter combination was prepared on the back table. This combination was CAT-5 catheter and a Trevo TRK microcatheter, with a synchro soft wire. This combination was then advanced through the balloon guide into the ICA. System was advanced into the internal carotid artery, to the level of the occlusion. The micro wire was then carefully advanced through the occluded segment. Microcatheter was then pushed through the occluded segment and the wire was removed. Intermediate catheter was advanced to the distal balloon guide. Blood was then aspirated through the hub of the microcatheter, and a gentle contrast injection was performed confirming intraluminal position. A rotating hemostatic valve was then attached to the back end of the microcatheter, and a pressurized and heparinized saline bag was attached to the catheter. 27m x 378mTrevo NXT device was then selected. Back flush was achieved at the rotating hemostatic valve, and then the device was gently advanced through the microcatheter to the distal end. The retriever was then unsheathed by withdrawing the microcatheter under fluoroscopy. Once the retriever was completely  unsheathed, the microcatheter was carefully stripped  from the delivery wire of the device. 3 minute time interval was observed. The balloon on the balloon guide was then inflated to profile of the vessel. Constant suction aspiration was then performed through the intermediate catheter, and constant gentle aspiration was performed at the balloon guide. This aspiration was continued as the retriever was gently and slowly withdrawn with fluoroscopic observation into the distal intermediate catheter. The entire system was then gently withdrawn from the intracranial ICA and into the balloon guide. Once the retriever was entirely removed from the system, free aspiration was confirmed at the hub of the balloon guide, with free blood return confirmed. Control angiogram was then performed, confirming restoration of flow. Balloon guide was withdrawn with angiogram performed of the cervical ICA. Balloon guide was removed, and the bright tip sheath was removed and a wire with Angio-Seal deployed. Pulses were confirmed bilateral lower extremity. Patient tolerated the procedure well and remained hemodynamically stable throughout. No complications were encountered. Estimated blood loss approximately 75 cc. IMPRESSION: Status post ultrasound guided access right common femoral artery for left-sided cervical and cerebral angiogram and mechanical thrombectomy of left M1 occlusion via single pass of Trevo 3x32 NXT device. Angio-Seal deployed for hemostasis. Signed, Dulcy Fanny. Dellia Nims, RPVI Vascular and Interventional Radiology Specialists [facility] Radiology PLAN: Admit to ICU Patient was extubated in the room Blood pressure target of 366 systolic Right hip straight times 8 hours, status post Angio-Seal deployment. Electronically Signed   By: Corrie Mckusick D.O.   On: 03/04/2019 01:25   IR US Guide Vasc Access Right  Result Date: 03/04/2019 INDICATION: 86 year old male with a history of acute right-sided symptoms and identification left MCA emergent large vessel occlusion EXAM:  ULTRASOUND GUIDED ACCESS RIGHT COMMON FEMORAL ARTERY CERVICAL AND CEREBRAL ANGIOGRAM MECHANICAL THROMBECTOMY OF LEFT MCA DEPLOYMENT OF ANGIO-SEAL FOR HEMOSTASIS COMPARISON:  CT IMAGING SAME DAY MEDICATIONS: 1 g vancomycin. The antibiotic was administered within 1 hour of the procedure ANESTHESIA/SEDATION: The anesthesia team was present to provide general endotracheal tube anesthesia and for patient monitoring during the procedure. Interventional neuro radiology nursing staff was also present. The patient was also continuously cared for during the procedure by the interventional radiology nurse under my direct supervision. CONTRAST:  50 cc IV contrast FLUOROSCOPY TIME:  Fluoroscopy Time: 14 minutes 24 seconds (1,007 mGy). COMPLICATIONS: None immediate. TECHNIQUE: Informed written consent was obtained from the patient after a thorough discussion of the procedural risks, benefits and alternatives. All questions were addressed. Maximal Sterile Barrier Technique was utilized including caps, mask, sterile gowns, sterile gloves, sterile drape, hand hygiene and skin antiseptic. A timeout was performed prior to the initiation of the procedure. Informed written consent was obtained from the patient's family after a thorough discussion of the procedural risks, benefits and alternatives. Specific risks discussed include: Bleeding, infection, contrast reaction, kidney injury/failure, need for further procedure/surgery, arterial injury or dissection, embolization to new territory, intracranial hemorrhage (10-15% risk), neurologic deterioration, cardiopulmonary collapse, death. All questions were addressed. Maximal Sterile Barrier Technique was utilized including during the procedure including caps, mask, sterile gowns, sterile gloves, sterile drape, hand hygiene and skin antiseptic. A timeout was performed prior to the initiation of the procedure. The anesthesia team was present to provide general endotracheal tube anesthesia  and for patient monitoring during the procedure. Interventional neuro radiology nursing staff was also present. FINDINGS: Initial Findings: Left common carotid artery:  Normal course caliber and contour. Left external carotid artery: Patent with antegrade flow. Left internal  carotid artery: Normal course caliber and contour of the cervical portion. Vertical and petrous segment patent with normal course caliber contour. Cavernous segment patent. Clinoid segment patent. Antegrade flow of the ophthalmic artery. Ophthalmic segment patent. Terminus patent. Left MCA:  Proximal M1 segment patent Early temporal branch, supplying the left temporal lobe. Early angiographic image demonstrates significant leptomeningeal collateral flow into the MCA territory from the [facility] territory and the patent temporal branches. Distal M1 occlusion. Left ACA: A 1 segment patent. A 2 segment perfuses the right territory. Patent anterior communicating artery. Leptomeningeal collaterals from the [place] territory through the watershed ANCA territory. Completion Findings: Left MCA: Restoration of fluoro through the MCA after mechanical thrombectomy. TICI 3 flow restored Flat panel CT demonstrates no subarachnoid hemorrhage. PROCEDURE: Patient is brought emergently to the neuro angiography suite, with the patient identified appropriately and placed supine position on the table. Left radial arterial line was placed by the anesthesia team. The patient is then prepped and draped in the usual sterile fashion. Ultrasound survey of the right inguinal region was performed with images stored and sent to PACs. 11 blade scalpel was used to make a small incision. Blunt dissection was performed. A micropuncture needle was used access the right common femoral artery under ultrasound. With excellent arterial blood flow returned, an .018 micro wire was passed through the needle, observed to enter the abdominal aorta under fluoroscopy. The needle was removed, and a  micropuncture sheath was placed over the wire. The inner dilator and wire were removed, and an 035 Bentson wire was advanced under fluoroscopy into the abdominal aorta. The sheath was removed and a standard 5 [place] vascular sheath was placed. The dilator was removed and the sheath was flushed. A 70F JB-1 diagnostic catheter was advanced over the wire to the proximal descending thoracic aorta. Wire was then removed. Double flush of the catheter was performed. Catheter was then used to select the left common carotid artery. Formal angiogram was performed demonstrating somewhat tortuous common carotid artery. Standard Glidewire and the JB 1 catheter were used to navigate to the bifurcation. Wire was removed and angiogram was performed. The roadrunner wire was then used to navigate the JB 1 catheter to the petrous segment. Wire was removed. Exchange length Rosen wire was then passed through the diagnostic catheter to the petrous ICA and the diagnostic catheter was removed. The 5 French sheath was removed and exchanged for 8 French 55 centimeter BrightTip sheath. Sheath was flushed and attached to pressurized and heparinized saline bag for constant forward flow. At this point we attempted to pass the 95 cm Walrus balloon guide catheter through the bright tip sheath. Multiple attempts using the introducer and without the introducer failed, and we ultimately decided that there was a mismatch, perhaps from quality control of the sheath. We then elected to use a flow gait balloon catheter. Eight French 95 cm flow gate balloon catheter was then advanced over the wire to the distal cervical segment. Wire was removed. Then a coaxial intermediate catheter and microcatheter combination was prepared on the back table. This combination was CAT-5 catheter and a Trevo TRK microcatheter, with a synchro soft wire. This combination was then advanced through the balloon guide into the ICA. System was advanced into the internal carotid  artery, to the level of the occlusion. The micro wire was then carefully advanced through the occluded segment. Microcatheter was then pushed through the occluded segment and the wire was removed. Intermediate catheter was advanced to the distal balloon guide.  Blood was then aspirated through the hub of the microcatheter, and a gentle contrast injection was performed confirming intraluminal position. A rotating hemostatic valve was then attached to the back end of the microcatheter, and a pressurized and heparinized saline bag was attached to the catheter. 27m x 349mTrevo NXT device was then selected. Back flush was achieved at the rotating hemostatic valve, and then the device was gently advanced through the microcatheter to the distal end. The retriever was then unsheathed by withdrawing the microcatheter under fluoroscopy. Once the retriever was completely unsheathed, the microcatheter was carefully stripped from the delivery wire of the device. 3 minute time interval was observed. The balloon on the balloon guide was then inflated to profile of the vessel. Constant suction aspiration was then performed through the intermediate catheter, and constant gentle aspiration was performed at the balloon guide. This aspiration was continued as the retriever was gently and slowly withdrawn with fluoroscopic observation into the distal intermediate catheter. The entire system was then gently withdrawn from the intracranial ICA and into the balloon guide. Once the retriever was entirely removed from the system, free aspiration was confirmed at the hub of the balloon guide, with free blood return confirmed. Control angiogram was then performed, confirming restoration of flow. Balloon guide was withdrawn with angiogram performed of the cervical ICA. Balloon guide was removed, and the bright tip sheath was removed and a wire with Angio-Seal deployed. Pulses were confirmed bilateral lower extremity. Patient tolerated the  procedure well and remained hemodynamically stable throughout. No complications were encountered. Estimated blood loss approximately 75 cc. IMPRESSION: Status post ultrasound guided access right common femoral artery for left-sided cervical and cerebral angiogram and mechanical thrombectomy of left M1 occlusion via single pass of Trevo 3x32 NXT device. Angio-Seal deployed for hemostasis. Signed, JaDulcy FannyWaDellia NimsRPVI Vascular and Interventional Radiology Specialists [facility]: Admit to ICU Patient was extubated in the room Blood pressure target of 16465ystolic Right hip straight times 8 hours, status post Angio-Seal deployment. Electronically Signed   By: JaCorrie Mckusick.O.   On: 03/04/2019 01:25   CT Code Stroke Cerebral Perfusion with contrast  Result Date: 03/03/2019 CLINICAL DATA:  Initial evaluation for acute stroke. EXAM: CT ANGIOGRAPHY HEAD AND NECK CT PERFUSION BRAIN TECHNIQUE: Multidetector CT imaging of the head and neck was performed using the standard protocol during bolus administration of intravenous contrast. Multiplanar CT image reconstructions and MIPs were obtained to evaluate the vascular anatomy. Carotid stenosis measurements (when applicable) are obtained utilizing NASCET criteria, using the distal internal carotid diameter as the denominator. Multiphase CT imaging of the brain was performed following IV bolus contrast injection. Subsequent parametric perfusion maps were calculated using RAPID software. CONTRAST:  11550mMNIPAQUE IOHEXOL 350 MG/ML SOLN COMPARISON:  Prior noncontrast head CT from earlier same day. FINDINGS: CTA NECK FINDINGS Aortic arch: Visualized aortic arch of normal caliber with normal branch pattern. Moderate atherosclerotic change about the arch and origin of the great vessels without hemodynamically significant stenosis. Visualized subclavian arteries patent. Right carotid system: Right common and internal carotid arteries are mildly tortuous but  widely patent without significant stenosis, dissection or occlusion. Left carotid system: Left common and internal carotid arteries are mildly tortuous but widely patent without significant stenosis, dissection or occlusion. Vertebral arteries: Both vertebral arteries arise from the subclavian arteries. Focal plaque at the origin of the right vertebral artery with associated 50% stenosis. Focal non stenotic plaque noted at the origin of the left vertebral artery as  well. Vertebral arteries tortuous but otherwise patent within the neck without stenosis, dissection, or occlusion. Skeleton: No acute osseous abnormality. No worrisome lytic or blastic osseous lesions. Moderate multilevel cervical spondylosis without significant stenosis. Other neck: No other acute soft tissue abnormality within the neck. No mass lesion or adenopathy. Upper chest: Mildly enlarged and partially calcified 12 mm precarinal lymph node noted. Patchy multifocal ground-glass opacities seen within the partially visualized lungs, similar to previous. Superimposed scattered subpleural reticular densities and fibrotic changes. Superimposed 9 mm nodular density at the posterior left upper lobe (series 5, image 169). Additional 9 mm pleural base nodular density partially visualized at the anterior right upper lobe (series 5, image 189). Review of the MIP images confirms the above findings CTA HEAD FINDINGS Anterior circulation: Scattered calcified plaque throughout the petrous, cavernous, and supraclinoid ICAs with associated moderate to severe multifocal narrowing. A1 segments patent. Normal anterior communicating artery. Anterior cerebral arteries widely patent to their distal aspects without stenosis. Left M1 patent. There is occlusive thrombus within the proximal left M2 branch, superior division. Attenuated collateral flow seen distally. Anterior temporal/inferior division remains patent. Right M1 widely patent. Normal right MCA bifurcation.  Short-segment severe stenosis at the origin of the right M2 inferior division. Right MCA branches well perfused distally. Posterior circulation: Calcified atheromatous plaque within the dominant left V4 segment with associated moderate multifocal stenosis. Diminutive right vertebral artery widely patent the vertebrobasilar junction. Posterior inferior cerebral arteries patent bilaterally. Basilar widely patent to its distal aspect. Superior cerebral arteries patent bilaterally. Both PCAs primarily supplied via the basilar. Short-segment severe distal left P2 stenosis noted. PCAs otherwise patent to their distal aspects without stenosis. Venous sinuses: Patent. Anatomic variants: None significant. Review of the MIP images confirms the above findings CT Brain Perfusion Findings: ASPECTS: 9 CBF (<30%) Volume: 12m Perfusion (Tmax>6.0s) volume: 553mMismatch Volume: 4425mnfarction Location:Patchy small volume acute core infarct seen involving the deep white matter of the left cerebral hemisphere, somewhat watershed in distribution. Surrounding ischemic penumbra of 55 cc, with mismatch volume of 44 cc. IMPRESSION: CTA HEAD AND NECK IMPRESSION: 1. Positive CTA for LVO with occlusive thrombus within a proximal left M2 branch, superior division. 2. Heavy calcified plaque throughout the carotid siphons with associated moderate to severe multifocal narrowing. 3. Short-segment severe proximal right M2 and distal left P2 stenoses. 4. Approximate 50% atheromatous stenoses at the origin of the right vertebral artery as well as involving the left V4 segment. CT PERFUSION IMPRESSION: 1. 11 cc acute core infarct involving the deep white matter of the left cerebral hemisphere, watershed in distribution. Surrounding 55 cc ischemic penumbra involving much of the left MCA. 2. Aspects equals 9 on prior head CT. These results were communicated to Dr. LinCheral Marker 11:22 pmon 1/2/2021by text page via the [facility]ssaging system.  Electronically Signed   By: BenJeannine BogaD.   On: 03/03/2019 23:44   VAS US [place] PSEUDOANEURYSM  Result Date: 03/07/2019  ARTERIAL PSEUDOANEURYSM  Exam: Right groin Indications: Patient complains of palpable knot and bleeding. History: S/p catheterization. Comparison Study: No prior study. Performing Technologist: MicMaudry MayhewA, RDMS, RVT, RDCS  Examination Guidelines: A complete evaluation includes B-mode imaging, spectral Doppler, color Doppler, and power Doppler as needed of all accessible portions of each vessel. Bilateral testing is considered an integral part of a complete examination. Limited examinations for reoccurring indications may be performed as noted. +------------+----------+--------+------+----------+ Right DuplexPSV (cm/s)WaveformPlaqueComment(s) +------------+----------+--------+------+----------+ CFA            152  biphasic                 +------------+----------+--------+------+----------+ Right Vein comments:patent right CFV  Findings: An area with well defined borders measuring 0.9 cm x 0.5 cm was visualized arising off of the right CFA with ultrasound characteristics of a pseudoaneurysm. Mixed echos within the structure suggest that it is partially thrombosed with a residual diameter  of 0.8 cm x 0.5 cm. The neck measures approximately 0.3 cm wide and 0.5 cm long. Distal right PTA is patent with biphasic flow.    --------------------------------------------------------------------------------    Preliminary    ECHOCARDIOGRAM COMPLETE  Result Date: 03/05/2019   ECHOCARDIOGRAM REPORT   Patient Name:   Jimmy Jimenez Date of Exam: 03/05/2019 Medical Rec #:  [id number]     Height:       68.0 in Accession #:    [id number]    Weight:       138.0 lb Date of Birth:  03/13/1925     BSA:          1.75 m Patient Age:    5 years      BP:           161/65 mmHg Patient Gender: M             HR:           89 bpm. Exam Location:  Inpatient Procedure: 2D Echo  Indications:    434.91 stroke  History:        Patient has prior history of Echocardiogram examinations, most                 recent 11/21/2012. Risk Factors:Hypertension and Dyslipidemia.  Sonographer:    Jannett Celestine RDCS (AE) Referring Phys: [phone] ERIC LINDZEN  Sonographer Comments: Suboptimal subcostal window. IMPRESSIONS  1. Left ventricular ejection fraction, by visual estimation, is 65 to 70%. The left ventricle has normal function. There is moderately increased left ventricular hypertrophy.  2. Left ventricular diastolic parameters are consistent with Grade I diastolic dysfunction (impaired relaxation).  3. The left ventricle has no regional wall motion abnormalities.  4. Global right ventricle has normal systolic function.The right ventricular size is normal. No increase in right ventricular wall thickness.  5. Left atrial size was normal.  6. Right atrial size was normal.  7. The mitral valve is normal in structure. Trivial mitral valve regurgitation. No evidence of mitral stenosis.  8. The tricuspid valve is normal in structure.  9. The aortic valve is tricuspid. Aortic valve regurgitation is mild. Mild aortic valve sclerosis without stenosis. 10. The pulmonic valve was normal in structure. Pulmonic valve regurgitation is not visualized. 11. Aortic dilatation noted. 12. There is mild dilatation of the ascending aorta measuring 40 mm. 13. Severely elevated pulmonary artery systolic pressure. 14. The atrial septum is grossly normal. FINDINGS  Left Ventricle: Left ventricular ejection fraction, by visual estimation, is 65 to 70%. The left ventricle has normal function. The left ventricle has no regional wall motion abnormalities. There is moderately increased left ventricular hypertrophy. Concentric left ventricular hypertrophy. Left ventricular diastolic parameters are consistent with Grade I diastolic dysfunction (impaired relaxation). Right Ventricle: The right ventricular size is normal. No increase in  right ventricular wall thickness. Global RV systolic function is has normal systolic function. Left Atrium: Left atrial size was normal in size. Right Atrium: Right atrial size was normal in size Pericardium: There is no evidence of pericardial effusion. Mitral Valve: The mitral valve is normal in structure. Trivial  mitral valve regurgitation. No evidence of mitral valve stenosis by observation. Tricuspid Valve: The tricuspid valve is normal in structure. Tricuspid valve regurgitation is mild. Aortic Valve: The aortic valve is tricuspid. Aortic valve regurgitation is mild. Mild aortic valve sclerosis is present, with no evidence of aortic valve stenosis. Mild aortic valve annular calcification. Pulmonic Valve: The pulmonic valve was normal in structure. Pulmonic valve regurgitation is not visualized. Pulmonic regurgitation is not visualized. Aorta: Aortic dilatation noted. There is mild dilatation of the ascending aorta measuring 40 mm. IAS/Shunts: The atrial septum is grossly normal.  LEFT VENTRICLE PLAX 2D LVIDd:         3.11 cm  Diastology LVIDs:         1.88 cm  LV e' lateral:   8.70 cm/s LV PW:         1.32 cm  LV E/e' lateral: 6.9 LV IVS:        1.40 cm  LV e' medial:    8.16 cm/s LVOT diam:     2.20 cm  LV E/e' medial:  7.4 LV SV:         27 ml LV SV Index:   15.81 LVOT Area:     3.80 cm  LEFT ATRIUM             Index       RIGHT ATRIUM           Index LA diam:        2.60 cm 1.49 cm/m  RA Area:     13.80 cm LA Vol (A2C):   50.7 ml 29.04 ml/m RA Volume:   28.00 ml  16.04 ml/m LA Vol (A4C):   35.7 ml 20.45 ml/m LA Biplane Vol: 45.1 ml 25.84 ml/m  AORTIC VALVE LVOT Vmax:   95.10 cm/s LVOT Vmean:  68.900 cm/s LVOT VTI:    0.210 m  AORTA Ao Root diam: 3.90 cm MITRAL VALVE                        TRICUSPID VALVE MV Area (PHT): 3.77 cm             TR Peak grad:   58.1 mmHg MV PHT:        58.29 msec           TR Vmax:        416.00 cm/s MV Decel Time: 201 msec MV E velocity: 60.00 cm/s 103 cm/s  SHUNTS MV A  velocity: 84.50 cm/s 70.3 cm/s Systemic VTI:  0.21 m MV E/A ratio:  0.71       1.5       Systemic Diam: 2.20 cm  [name] MD Electronically signed by [name] MD Signature Date/Time: 03/05/2019/1:08:04 PM    Final    IR PERCUTANEOUS ART THROMBECTOMY/INFUSION INTRACRANIAL INC DIAG ANGIO  Result Date: 03/04/2019 INDICATION: 86 year old male with a history of acute right-sided symptoms and identification left MCA emergent large vessel occlusion EXAM: ULTRASOUND GUIDED ACCESS RIGHT COMMON FEMORAL ARTERY CERVICAL AND CEREBRAL ANGIOGRAM MECHANICAL THROMBECTOMY OF LEFT MCA DEPLOYMENT OF ANGIO-SEAL FOR HEMOSTASIS COMPARISON:  CT IMAGING SAME DAY MEDICATIONS: 1 g vancomycin. The antibiotic was administered within 1 hour of the procedure ANESTHESIA/SEDATION: The anesthesia team was present to provide general endotracheal tube anesthesia and for patient monitoring during the procedure. Interventional neuro radiology nursing staff was also present. The patient was also continuously cared for during the procedure by the interventional radiology nurse under my direct  supervision. CONTRAST:  50 cc IV contrast FLUOROSCOPY TIME:  Fluoroscopy Time: 14 minutes 24 seconds (1,007 mGy). COMPLICATIONS: None immediate. TECHNIQUE: Informed written consent was obtained from the patient after a thorough discussion of the procedural risks, benefits and alternatives. All questions were addressed. Maximal Sterile Barrier Technique was utilized including caps, mask, sterile gowns, sterile gloves, sterile drape, hand hygiene and skin antiseptic. A timeout was performed prior to the initiation of the procedure. Informed written consent was obtained from the patient's family after a thorough discussion of the procedural risks, benefits and alternatives. Specific risks discussed include: Bleeding, infection, contrast reaction, kidney injury/failure, need for further procedure/surgery, arterial injury or dissection, embolization to new  territory, intracranial hemorrhage (10-15% risk), neurologic deterioration, cardiopulmonary collapse, death. All questions were addressed. Maximal Sterile Barrier Technique was utilized including during the procedure including caps, mask, sterile gowns, sterile gloves, sterile drape, hand hygiene and skin antiseptic. A timeout was performed prior to the initiation of the procedure. The anesthesia team was present to provide general endotracheal tube anesthesia and for patient monitoring during the procedure. Interventional neuro radiology nursing staff was also present. FINDINGS: Initial Findings: Left common carotid artery:  Normal course caliber and contour. Left external carotid artery: Patent with antegrade flow. Left internal carotid artery: Normal course caliber and contour of the cervical portion. Vertical and petrous segment patent with normal course caliber contour. Cavernous segment patent. Clinoid segment patent. Antegrade flow of the ophthalmic artery. Ophthalmic segment patent. Terminus patent. Left MCA:  Proximal M1 segment patent Early temporal branch, supplying the left temporal lobe. Early angiographic image demonstrates significant leptomeningeal collateral flow into the MCA territory from the [facility] territory and the patent temporal branches. Distal M1 occlusion. Left ACA: A 1 segment patent. A 2 segment perfuses the right territory. Patent anterior communicating artery. Leptomeningeal collaterals from the [place] territory through the watershed ANCA territory. Completion Findings: Left MCA: Restoration of fluoro through the MCA after mechanical thrombectomy. TICI 3 flow restored Flat panel CT demonstrates no subarachnoid hemorrhage. PROCEDURE: Patient is brought emergently to the neuro angiography suite, with the patient identified appropriately and placed supine position on the table. Left radial arterial line was placed by the anesthesia team. The patient is then prepped and draped in the usual  sterile fashion. Ultrasound survey of the right inguinal region was performed with images stored and sent to PACs. 11 blade scalpel was used to make a small incision. Blunt dissection was performed. A micropuncture needle was used access the right common femoral artery under ultrasound. With excellent arterial blood flow returned, an .018 micro wire was passed through the needle, observed to enter the abdominal aorta under fluoroscopy. The needle was removed, and a micropuncture sheath was placed over the wire. The inner dilator and wire were removed, and an 035 Bentson wire was advanced under fluoroscopy into the abdominal aorta. The sheath was removed and a standard 5 [place] vascular sheath was placed. The dilator was removed and the sheath was flushed. A 49F JB-1 diagnostic catheter was advanced over the wire to the proximal descending thoracic aorta. Wire was then removed. Double flush of the catheter was performed. Catheter was then used to select the left common carotid artery. Formal angiogram was performed demonstrating somewhat tortuous common carotid artery. Standard Glidewire and the JB 1 catheter were used to navigate to the bifurcation. Wire was removed and angiogram was performed. The roadrunner wire was then used to navigate the JB 1 catheter to the petrous segment. Wire was removed. Exchange  length Rosen wire was then passed through the diagnostic catheter to the petrous ICA and the diagnostic catheter was removed. The 5 French sheath was removed and exchanged for 8 French 55 centimeter BrightTip sheath. Sheath was flushed and attached to pressurized and heparinized saline bag for constant forward flow. At this point we attempted to pass the 95 cm [place] balloon guide catheter through the bright tip sheath. Multiple attempts using the introducer and without the introducer failed, and we ultimately decided that there was a mismatch, perhaps from quality control of the sheath. We then elected to use a  flow gait balloon catheter. Eight French 95 cm flow gate balloon catheter was then advanced over the wire to the distal cervical segment. Wire was removed. Then a coaxial intermediate catheter and microcatheter combination was prepared on the back table. This combination was CAT-5 catheter and a Trevo TRK microcatheter, with a synchro soft wire. This combination was then advanced through the balloon guide into the ICA. System was advanced into the internal carotid artery, to the level of the occlusion. The micro wire was then carefully advanced through the occluded segment. Microcatheter was then pushed through the occluded segment and the wire was removed. Intermediate catheter was advanced to the distal balloon guide. Blood was then aspirated through the hub of the microcatheter, and a gentle contrast injection was performed confirming intraluminal position. A rotating hemostatic valve was then attached to the back end of the microcatheter, and a pressurized and heparinized saline bag was attached to the catheter. 10m x 324mTrevo NXT device was then selected. Back flush was achieved at the rotating hemostatic valve, and then the device was gently advanced through the microcatheter to the distal end. The retriever was then unsheathed by withdrawing the microcatheter under fluoroscopy. Once the retriever was completely unsheathed, the microcatheter was carefully stripped from the delivery wire of the device. 3 minute time interval was observed. The balloon on the balloon guide was then inflated to profile of the vessel. Constant suction aspiration was then performed through the intermediate catheter, and constant gentle aspiration was performed at the balloon guide. This aspiration was continued as the retriever was gently and slowly withdrawn with fluoroscopic observation into the distal intermediate catheter. The entire system was then gently withdrawn from the intracranial ICA and into the balloon guide. Once  the retriever was entirely removed from the system, free aspiration was confirmed at the hub of the balloon guide, with free blood return confirmed. Control angiogram was then performed, confirming restoration of flow. Balloon guide was withdrawn with angiogram performed of the cervical ICA. Balloon guide was removed, and the bright tip sheath was removed and a wire with Angio-Seal deployed. Pulses were confirmed bilateral lower extremity. Patient tolerated the procedure well and remained hemodynamically stable throughout. No complications were encountered. Estimated blood loss approximately 75 cc. IMPRESSION: Status post ultrasound guided access right common femoral artery for left-sided cervical and cerebral angiogram and mechanical thrombectomy of left M1 occlusion via single pass of Trevo 3x32 NXT device. Angio-Seal deployed for hemostasis. Signed, JaDulcy FannyWaDellia NimsRPVI [facility] PLAN: Admit to ICU Patient was extubated in the room Blood pressure target of 16710ystolic Right hip straight times 8 hours, status post Angio-Seal deployment. Electronically Signed   By: JaCorrie Mckusick.O.   On: 03/04/2019 01:25   CT HEAD CODE STROKE WO CONTRAST`  Result Date: 03/03/2019 CLINICAL DATA:  Code stroke. Initial evaluation for acute stroke, facial droop.  EXAM: CT HEAD WITHOUT CONTRAST TECHNIQUE: Contiguous axial images were obtained from the base of the skull through the vertex without intravenous contrast. COMPARISON:  None available. FINDINGS: Brain: Generalized age-related cerebral atrophy with moderate chronic microvascular ischemic disease. No acute intracranial hemorrhage. Subtle hypodensity seen involving the left caudate, concerning for possible evolving left MCA territory infarct. Gray-white matter differentiation otherwise grossly maintained without definite discernible evidence for evolving ischemia. No mass lesion or midline shift. No  hydrocephalus. No extra-axial fluid collection. Vascular: Apparent hyperdensity noted involving the distal left M1 and/or proximal M2 branch (a series 6, image 45), concerning for possible LVO. Calcified atherosclerosis present at the skull base. Skull: Scalp soft tissues and calvarium within normal limits. Sinuses/Orbits: Globes and orbital soft tissues within normal limits. Sequelae of prior sinus surgery noted. Chronic mucosal thickening noted within the maxillary sinuses, ethmoidal air cells, and right sphenoid sinus. Mastoid air cells are clear. Other: None. ASPECTS [facility] Stroke Program Early CT Score) - Ganglionic level infarction (caudate, lentiform nuclei, internal capsule, insula, M1-M3 cortex): 6 - Supraganglionic infarction (M4-M6 cortex): 3 Total score (0-10 with 10 being normal): 9 IMPRESSION: 1. Asymmetric hyperdensity involving the distal left M1 and/or proximal M2 branch, concerning for LVO. Subtle hypodensity involving the left caudate concerning for evolving left MCA territory ischemia. No intracranial hemorrhage. 2. ASPECTS is 9. 3. Underlying age-related cerebral atrophy with moderate chronic small vessel ischemic disease. These results were communicated to Dr. Cheral Marker At 10:58 pmon 1/2/2021by text page via the [facility] messaging system. Electronically Signed   By: Jeannine Boga M.D.   On: 03/03/2019 23:02    Labs:  CBC: Recent Labs    03/03/19 2237 03/03/19 2247 03/04/19 1537 03/06/19 0301 03/06/19 2338  WBC 16.6*  --  14.8* 12.0* 13.0*  HGB 13.8 15.0 12.8* 11.4* 11.8*  HCT 41.3 44.0 37.6* 34.4* 35.7*  PLT 677*  --  591* 481* 491*    COAGS: Recent Labs    03/03/19 2237  INR 1.1  APTT 38*    BMP: Recent Labs    03/03/19 0339 03/03/19 2237 03/03/19 2247 03/04/19 1537 03/06/19 2338  NA 132* 131* 131* 135 130*  K 3.8 3.8 3.8 3.9 3.9  CL 96* 97* 95* 102 94*  CO2 30 25  --  26 29  GLUCOSE 139* 167* 159* 148* 127*  BUN 14 19 23 15 19   CALCIUM 8.6* 9.2  --   8.4* 8.4*  CREATININE 0.78 1.17 1.10 0.93 1.07  GFRNONAA >60 54*  --  >60 >60  GFRAA >60 >60  --  >60 >60    LIVER FUNCTION TESTS: Recent Labs    09/28/18 1344 11/27/18 1339 03/03/19 2237 03/04/19 1537  BILITOT 0.8 1.2 1.0 0.9  AST 18 16 21 21   ALT 11 14 17 18   ALKPHOS 79 60 58 59  PROT 6.7 6.7 6.1* 5.3*  ALBUMIN 4.6 4.3 3.5 3.0*     Assessment and Plan:  History of acute CVA s/p emergent mechanical thrombectomy of left MCA M1 segment occlusion via right femoral approach achieving a TICI 3 revascularization 03/03/2019 by Dr. Earleen Newport; with subsequent development of right CFA pseudoaneurysm seen on VAS [place] today. Plan for image-guided thrombin injection of right CFA pseudoaneurysm today in IR. Patient to lay flat with right leg straight until procedure Bethann Berkshire, RN aware). Patient is NPO. Afebrile. INR 1.1 03/03/2019.  Risks and benefits discussed with the patient including, but not limited to bleeding, infection, damage to adjacent structures or low yield requiring additional tests.  All of the patient's questions were answered, patient is agreeable to proceed. Consent signed and in chart.   Thank you for this interesting consult.  I greatly enjoyed meeting Jimmy Jimenez and look forward to participating in their care.  A copy of this report was sent to the requesting provider on this date.  Electronically Signed: Earley Abide, PA-C 03/07/2019, 11:10 AM   I spent a total of 40 Minutes in face to face in clinical consultation, greater than 50% of which was counseling/coordinating care for right CFA pseudoaneurysm/thrombin injection.

## 2019-03-07 NOTE — Progress Notes (Signed)
Cath lab requesting to transport patient for insertion of loop recorder. Cath lab made aware he just returned from IR due to right groin bleeding. Will continue to assess groin per order until handoff to RN in cath lab.

## 2019-03-07 NOTE — Progress Notes (Signed)
Interventional Radiology Progress Note  86 yo male SP right CFA access for mechanical thrombectomy M1 on 03/03/2019.    Oozing at right groin upon ambulating   Duplex confirms small pseudoaneurysm.    Images reviewed.  I have discussed with my partner Dr. Laurence Ferrari.  Plan for attempt at treatment today with image guided thrombin injection.   Bedrest until treatment, with compression dressing in place.  Right hip straight until treatment.  Post-treatment anticipate 6 hours bedrest with right hip straight, with repeat duplex.    Call with questions/concerns.  Signed,  Dulcy Fanny. Earleen Newport, DO

## 2019-03-07 NOTE — Progress Notes (Signed)
Limited right lower extremity arterial duplex completed. Refer to "CV Proc" under chart review to view preliminary results.  Carly, RN and Dr. Earleen Newport aware of critical results.  03/07/2019 10:16 AM Kelby Aline., MHA, RVT, RDCS, RDMS

## 2019-03-07 NOTE — Procedures (Signed)
Interventional Radiology Procedure Note  Procedure: US guided thrombin injection  Complications: None  Estimated Blood Loss: None  Recommendations: - Bedrest with leg straight x 6 hrs - Repeat US prior to allowing ambulation  Signed,  Criselda Peaches, MD

## 2019-03-07 NOTE — Progress Notes (Signed)
Patient began oozing blood at groin 30 minutes prior to transport to radiology. Patient transported with RN and transport team. Radiology RN made aware of bleeding and held pressure on arrival.

## 2019-03-07 NOTE — Progress Notes (Signed)
PT Cancellation Note  Patient Details Name: Jimmy Jimenez MRN: [id number] DOB: 03/13/1925   Cancelled Treatment:    Reason Eval/Treat Not Completed: Patient not medically ready;Patient at procedure or test/unavailable.  Pt in Cath lab on arrival.  Pt also on bedrest post procedure for 6 hours.  Will see pt as able 1/7. 03/07/2019  Jimmy Carne., PT Acute Rehabilitation Services [phone]  (pager) [phone]  (office)   Jimmy Jimenez 03/07/2019, 4:36 PM

## 2019-03-07 NOTE — Progress Notes (Signed)
STROKE TEAM PROGRESS NOTE   INTERVAL HISTORY Patient had bleeding in right groin after ambulation earlier today which has stopped with pressure dressing and bedrest. Informed IR team and ultrasound shows small pseudoaneurysm and plan thrombin injection for obliteration later today.   He has no complaints.  Vital signs are stable. Not sure if TEE also can be done today or not OBJECTIVE Vitals:   03/07/19 1200 03/07/19 1400 03/07/19 1405 03/07/19 1412  BP:  (!) 166/58 (!) 162/61 (!) 168/53  Pulse:  74 74 74  Resp:  (!) 21 (!) 23 (!) 22  Temp: 97.8 F (36.6 C)     TempSrc: Oral     SpO2:  96% 96% 96%  Weight:      Height:        CBC:  Recent Labs  Lab 03/03/19 2237 03/04/19 1537 03/06/19 0301 03/06/19 2338  WBC 16.6* 14.8* 12.0* 13.0*  NEUTROABS 14.1* 11.3*  --   --   HGB 13.8 12.8* 11.4* 11.8*  HCT 41.3 37.6* 34.4* 35.7*  MCV 102.0* 100.5* 103.3* 102.0*  PLT 677* 591* 481* 491*    Basic Metabolic Panel:  Recent Labs  Lab 03/04/19 1537 03/06/19 2338  NA 135 130*  K 3.9 3.9  CL 102 94*  CO2 26 29  GLUCOSE 148* 127*  BUN 15 19  CREATININE 0.93 1.07  CALCIUM 8.4* 8.4*    Lipid Panel:     Component Value Date/Time   CHOL 158 03/04/2019 0449   TRIG 64 03/04/2019 0449   HDL 58 03/04/2019 0449   CHOLHDL 2.7 03/04/2019 0449   VLDL 13 03/04/2019 0449   LDLCALC 87 03/04/2019 0449   HgbA1c:  Lab Results  Component Value Date   HGBA1C 4.9 03/04/2019   Urine Drug Screen: No results found for: LABOPIA, COCAINSCRNUR, LABBENZ, AMPHETMU, THCU, LABBARB  Alcohol Level No results found for: [facility]  IMAGING  CT HEAD CODE STROKE WO CONTRAST 03/03/2019 1. Asymmetric hyperdensity involving the distal left M1 and/or proximal M2 branch, concerning for LVO. Subtle hypodensity involving the left caudate concerning for evolving left MCA territory ischemia. No intracranial hemorrhage. 2. ASPECTS is 9.  3. Underlying age-related cerebral atrophy with moderate chronic small vessel  ischemic disease.   CT Code Stroke CTA Head W/WO contrast CT Code Stroke CTA Neck W/WO contrast 03/03/2019 1. Positive CTA for LVO with occlusive thrombus within a proximal left M2 branch, superior division.  2. Heavy calcified plaque throughout the carotid siphons with associated moderate to severe multifocal narrowing.  3. Short-segment severe proximal right M2 and distal left P2 stenoses.  4. Approximate 50% atheromatous stenoses at the origin of the right vertebral artery as well as involving the left V4 segment.   CT Code Stroke Cerebral Perfusion with contrast 03/03/2019 1. 11 cc acute core infarct involving the deep white matter of the left cerebral hemisphere, watershed in distribution. Surrounding 55 cc ischemic penumbra involving much of the left MCA.  2. Aspects equals 9 on prior head CT.   [place] 03/04/2019 Status post ultrasound guided access right common femoral artery for left-sided cervical and cerebral angiogram and mechanical thrombectomy of left M1 occlusion via single pass of Trevo 3x32 NXT device. Angio-Seal deployed for hemostasis.    MR BRAIN WO CONTRAST 03/04/2019 1. Acute ischemic left MCA territory infarcts primarily involving the left caudate and lentiform nuclei. No associated hemorrhage or mass effect. 2. No other acute intracranial abnormality. 3. Age-related cerebral atrophy with mild chronic small vessel ischemic  disease.   ECHOCARDIOGRAM COMPLETE 03/05/2019 1. Left ventricular ejection fraction, by visual estimation, is 65 to 70%. The left ventricle has normal function. There is moderately increased left ventricular hypertrophy.  2. Left ventricular diastolic parameters are consistent with Grade I diastolic dysfunction (impaired relaxation).  3. The left ventricle has no regional wall motion abnormalities.  4. Global right ventricle has normal systolic function.The right ventricular size is normal. No increase in right ventricular wall thickness.  5. Left  atrial size was normal.  6. Right atrial size was normal.  7. The mitral valve is normal in structure. Trivial mitral valve regurgitation. No evidence of mitral stenosis.  8. The tricuspid valve is normal in structure.  9. The aortic valve is tricuspid. Aortic valve regurgitation is mild. Mild aortic valve sclerosis without stenosis. 10. The pulmonic valve was normal in structure. Pulmonic valve regurgitation is not visualized. 11. Aortic dilatation noted. 12. There is mild dilatation of the ascending aorta measuring 40 mm. 13. Severely elevated pulmonary artery systolic pressure. 14. The atrial septum is grossly normal.   ECG - SR rate 84 BPM. LVH (See cardiology reading for complete details)   PHYSICAL EXAM    Pleasant elderly Caucasian male.  Not in distress. . Afebrile. Head is nontraumatic. Neck is supple without bruit.    Cardiac exam no murmur or gallop. Lungs are clear to auscultation. Distal pulses are well felt.  Neurological Exam : patient is awake alert, slight dysarthria, following simple commands, pupils equally round and reactive to light, blinks to threat bilaterally, extraocular movements intact, right facial droop, intact sensation on the face, hearing intact to voice, tongue midline, he has some right mild hemiparesis but he is intact to light touch in all extremities.  No ataxia or dysmetria finger-to-nose.  Gait deferred.   ASSESSMENT/PLAN Mr. Japeth Dulski is a 86 y.o. male with history of chronic diastolic CHF, pulmonary fibrosis, pulmonary HTN, hx of lung nodule (2006), essential thrombocytosis (on hydroxyurea), HLD, HTN and recent acute on chronic respiratory failure with hypoxia treated at [facility] presenting with intermittent aphasia and right sided weakness. IV tPA Saturday 03/03/19 at 2315. IR - mechanical thrombectomy of left M1 occlusion  Stroke:  Left MCA infarcts s/p tpA and successful IR  - embolic - source unknown  Code Stroke CT Head - Asymmetric hyperdensity involving  the distal left M1 and/or proximal M2 branch, concerning for LVO. Subtle hypodensity involving the left caudate concerning for evolving left MCA territory ischemia. No intracranial hemorrhage. ASPECTS is 9. Underlying age-related cerebral atrophy with moderate chronic small vessel ischemic disease.   CTA H&N - Positive CTA for LVO with occlusive thrombus within a proximal left M2 branch, superior division. Heavy calcified plaque throughout the carotid siphons with associated moderate to severe multifocal narrowing. Short-segment severe proximal right M2 and distal left P2 stenoses. Approximate 50% atheromatous stenoses at the origin of the right vertebral artery as well as involving the left V4 segmen  CT Perfusion - 11 cc acute core infarct involving the deep white matter of the left cerebral hemisphere, watershed in distribution. Surrounding 55 cc ischemic penumbra involving much of the left MCA.   Cerebral angio L M1 occlusion w/ revascularization   MRI head -  L MCA infarcts (caudate head and lentiform nucleus). Small vessel disease. Atrophy.   2D Echo - EF 65-70%. No source of embolus  TEE pending  Wed  If TEE negative, a [place] electrophysiologist will consult and consider placement of an implantable  loop recorder to evaluate for atrial fibrillation as etiology of stroke. This has been explained to patient by Dr. Leonie Man and his is agreeable.   Sars Corona Virus 2 - negative  LDL - 87  HgbA1c - 4.8  VTE prophylaxis - SCDs. Add lovenox.  aspirin 81 mg daily prior to admission, now on aspirin 81 mg daily and clopidogrel 75 mg daily 3 weeks then plavix alone   Therapy recommendations:  HH PT, HH OT  Disposition:  Pending  Await transfer to the floor, tele  Hypertension  Home BP meds: Zestril, hydralazine, Amlodipine (listed allergy to norvasc not taking), lasix  Current BP meds:  lisinopril 40, hydralazine 50 q 8h . Long-term BP goal  normotensive  Hyperlipidemia  Home Lipid lowering medication: none   LDL 104, goal < 70  start Lipitor 10 mg daily - will not use intensive statin given advanced age  Continue statin at discharge  Other Stroke Risk Factors  Advanced age  Chronic diastolic CHF on lasix. Decrease IVF to 50h    Other Active Problems  Leukocytosis - 16.6->14.8->12 (afebrile)   Hyponatremia, resolved - 131->135  Essential Thrombocytosis - 677->591->481 on hydroxyurea PTA    Mild Bradycardia, resolved  Mild acute blood loss anemia s/p IR 13.8->15->12.8->11.4   pulm fibrosis  pulm HTN  Rt groin pseudoaneyrysm with bleed  Hospital day # 3  Continue care of right groin pseudoaneurysm as per interventional radiology team.  Discussed with Dr. Earleen Newport.  Unsure if TEE can still be done today or not.  Greater than 50% time during this 35-minute visit was spent on counseling and coordination of care and discussion with care team.  No family available at bedside today for discussion  Antony Contras, MD Medical Director [place] Pager: [phone] 03/07/2019 2:17 PM  To contact Stroke Continuity provider, please refer to [email or web address] After hours, contact General Neurology

## 2019-03-07 NOTE — Progress Notes (Addendum)
NIR.  History of acute CVA s/p emergent mechanical thrombectomy of left MCA M1 segment occlusion via right femoral approach achieving a TICI 3 revascularization 03/03/2019 by Dr. Earleen Newport.  Per RN, last evening patient with notable oozing from right groin after walking to and from bathroom. Apparently when he was back in bed, his bed was saturated with blood and clots. Oozing stopped at approximately 2100 last evening per chart, pressure dressing was applied, and patient to lay flat in bed until this AM per Dr. Estanislado Pandy.  Patient for TEE/loop recorder this AM. Blaisdell Majestic to assess patient bedside prior to these procedures. Patient awake and alert laying in bed with no complaints at this time. Right groin incision with approximately 2 cm firmness at superior aspect of insertion site. Distal pulses 2+ bilaterally, right femoral pulse 2+.  Will obtain VAS [place] to R/O pseudoaneurysm/aneurysm prior to patient going for TEE/loop recorder- Dr. Estanislado Pandy, Dr. Earleen Newport, and Bethann Berkshire, RN aware. Hold all thinners until VAS [place] complete. Further plans per neurology- appreciate and agree with management. NIR to follow.   Bea Graff Javarie Crisp, PA-C 03/07/2019, 9:21 AM

## 2019-03-07 NOTE — Interval H&P Note (Signed)
History and Physical Interval Note:  03/07/2019 10:16 AM  Jimmy Jimenez  has presented today for surgery, with the diagnosis of STROKE.  The various methods of treatment have been discussed with the patient and family. After consideration of risks, benefits and other options for treatment, the patient has consented to  Procedure(s): TRANSESOPHAGEAL ECHOCARDIOGRAM (TEE) (N/A) as a surgical intervention.  The patient's history has been reviewed, patient examined, no change in status, stable for surgery.  I have reviewed the patient's chart and labs.  Questions were answered to the patient's satisfaction.     [name]

## 2019-03-07 NOTE — Progress Notes (Signed)
  Note events from today. Pt with R groin pseudoaneurysm now s/p US guided thrombin injection. TEE deferred.  Per Dr. Leonie Man, wwill forego TEE, and proceed with loop.   Discussed with Dr. Earleen Newport of IR who saw the patient earlier in the day.   Since LINQ will not violate bedrest, OK to proceed with implant today.   Discussed all above with Dr. Curt Bears.   Legrand Como [address]" [place], [place]  03/07/2019 3:38 PM

## 2019-03-08 ENCOUNTER — Inpatient Hospital Stay (HOSPITAL_COMMUNITY): Payer: No Typology Code available for payment source

## 2019-03-08 DIAGNOSIS — D72829 Elevated white blood cell count, unspecified: Secondary | ICD-10-CM | POA: Diagnosis present

## 2019-03-08 DIAGNOSIS — I63412 Cerebral infarction due to embolism of left middle cerebral artery: Principal | ICD-10-CM

## 2019-03-08 DIAGNOSIS — I729 Aneurysm of unspecified site: Secondary | ICD-10-CM | POA: Diagnosis not present

## 2019-03-08 DIAGNOSIS — I721 Aneurysm of artery of upper extremity: Secondary | ICD-10-CM

## 2019-03-08 DIAGNOSIS — E441 Mild protein-calorie malnutrition: Secondary | ICD-10-CM | POA: Diagnosis present

## 2019-03-08 DIAGNOSIS — D62 Acute posthemorrhagic anemia: Secondary | ICD-10-CM | POA: Diagnosis not present

## 2019-03-08 MED ORDER — CLOPIDOGREL BISULFATE 75 MG PO TABS: 75.0000 mg | ORAL_TABLET | Freq: Every day | ORAL | 2 refills | Status: DC

## 2019-03-08 MED ORDER — ATORVASTATIN CALCIUM 10 MG PO TABS: 10.0000 mg | ORAL_TABLET | Freq: Every day | ORAL | 2 refills | Status: DC

## 2019-03-08 MED ORDER — ENSURE ENLIVE PO LIQD: 237.0000 mL | Freq: Two times a day (BID) | ORAL | 12 refills | Status: DC

## 2019-03-08 MED ORDER — ASPIRIN EC 81 MG PO TBEC: 81.0000 mg | DELAYED_RELEASE_TABLET | Freq: Every day | ORAL | 0 refills | Status: AC

## 2019-03-08 NOTE — Progress Notes (Signed)
Right groin pseudoaneurysm surveillance has been completed. Preliminary results can be found in CV Proc through chart review.  Results were given to the patient's nurse, Claiborne Billings.  03/08/19 12:00 PM Jimmy Jimenez RVT

## 2019-03-08 NOTE — Progress Notes (Signed)
Supervising Physician: Corrie Mckusick  Patient Status:  [facility] - In-pt  Chief Complaint: Follow up right CFA pseudoaneurysm   Subjective:  Patient laying in bed, wife at bedside. He denies complaints currently, has not had breakfast or lunch per RN. Wondering when he will be able to go home. Patient seen in conjunction with Dr. Earleen Newport today.   Allergies: Amlodipine, Atenolol, Erythromycin, Furosemide, Hctz [hydrochlorothiazide], Hydrochlorothiazide w-triamterene, Levofloxacin, Metoprolol, Micardis [telmisartan], Penicillins, Terazosin, and Triamterene-hctz  Medications: Prior to Admission medications   Medication Sig Start Date End Date Taking? Authorizing Provider  albuterol (VENTOLIN HFA) 108 (90 Base) MCG/ACT inhaler Inhale 2 puffs into the lungs every 6 (six) hours as needed for wheezing or shortness of breath. 03/03/19  Yes Wieting, Richard, MD  amLODipine (NORVASC) 5 MG tablet TAKE 1 TABLET BY MOUTH ONCE DAILY 12/26/18  Yes Crecencio Mc, MD  aspirin EC 81 MG tablet Take 81 mg by mouth daily. Reported on 08/14/2015   Yes [provider]  azelastine (ASTELIN) 0.1 % nasal spray Place 2 sprays into both nostrils 2 (two) times daily.  02/10/19  Yes [provider]  beclomethasone (QVAR REDIHALER) 40 MCG/ACT inhaler Inhale 1 puff into the lungs 2 (two) times daily. 03/03/19  Yes Wieting, Richard, MD  benzonatate (TESSALON) 200 MG capsule Take 1 capsule (200 mg total) by mouth 2 (two) times daily as needed for cough. 01/04/19  Yes Crecencio Mc, MD  Calcium Citrate-Vitamin D (CITRACAL + D PO) Take 2 tablets by mouth daily. Take 2 by mouth daily, calcium 630 and vitamin D 500 each.    Yes [provider]  Cholecalciferol (VITAMIN D3) 1000 UNITS CAPS Take 2,000 Units by mouth daily. Take 2 by mouth daily   Yes [provider]  clotrimazole-betamethasone (LOTRISONE) cream Apply topically 2 (two) times daily. 11/28/18  Yes Crecencio Mc, MD  Cyanocobalamin  (B-12 COMPLIANCE INJECTION) 1000 MCG/ML KIT Inject 1,000 mcg as directed every 30 (thirty) days.    Yes [provider]  cyanocobalamin 1000 MCG tablet Take 5,000 mcg by mouth daily.   Yes [provider]  docusate sodium (COLACE) 100 MG capsule Take 100 mg by mouth 2 (two) times daily as needed for mild constipation.   Yes [provider]  esomeprazole (NEXIUM) 40 MG capsule TAKE 1 CAPSULE BY MOUTH ONCE DAILY AT NOON Patient taking differently: Take 40 mg by mouth daily.  11/27/18  Yes Crecencio Mc, MD  furosemide (LASIX) 20 MG tablet TAKE 1 TABLET BY MOUTH EVERY OTHER DAY IN MORNING Patient taking differently: Take 20 mg by mouth every other day. TAKE 1 TABLET BY MOUTH EVERY OTHER DAY IN MORNING 10/31/18  Yes Crecencio Mc, MD  hydrALAZINE (APRESOLINE) 50 MG tablet TAKE 1 TABLET BY MOUTH 3 TIMES DAILY 02/26/19  Yes Crecencio Mc, MD  hydroxyurea (HYDREA) 500 MG capsule TAKE 1 CAPSULE BY MOUTH EVERY OTHER DAY MAY TAKE WITH FOOD TO MINIMIZE GI SIDE EFFECTS. 08/08/18  Yes Cammie Sickle, MD  Infant Care Products (Porcupine) CREA Apply 1 application topically as needed (after each bowel movement for barrier for  pressure ulcer.). 02/15/19  Yes Crecencio Mc, MD  lisinopril (ZESTRIL) 40 MG tablet TAKE 1 TABLET BY MOUTH ONCE DAILY 09/15/18  Yes Crecencio Mc, MD  doxycycline (VIBRA-TABS) 100 MG tablet Take 1 tablet (100 mg total) by mouth every 12 (twelve) hours for 6 days. 03/03/19 03/09/19  Loletha Grayer, MD  testosterone cypionate (DEPOTESTOSTERONE CYPIONATE) 200  MG/ML injection INJECT 0.25 ML INTRAMUSCULARLY EVERY 14 DAYS AS DIRECTED 02/16/19   Crecencio Mc, MD     Vital Signs: BP (!) 139/57    Pulse 76    Temp 97.7 F (36.5 C) (Axillary)    Resp (!) 23    Ht 5' 8"  (1.727 m)    Wt 138 lb 0.1 oz (62.6 kg)    SpO2 96%    BMI 20.98 kg/m   Physical Exam Vitals and nursing note reviewed.  Constitutional:      General: He is not in acute distress.     Appearance: He is ill-appearing.  HENT:     Head: Normocephalic.  Cardiovascular:     Rate and Rhythm: Normal rate.     Pulses: Normal pulses.  Pulmonary:     Effort: Pulmonary effort is normal.  Musculoskeletal:     Right lower leg: Edema present.  Skin:    General: Skin is warm and dry.  Neurological:     Mental Status: He is alert. Mental status is at baseline.     Cranial Nerves: No cranial nerve deficit.     Imaging: MR BRAIN WO CONTRAST  Result Date: 03/04/2019 CLINICAL DATA:  Initial evaluation for acute stroke, status post tPA. EXAM: MRI HEAD WITHOUT CONTRAST TECHNIQUE: Multiplanar, multiecho pulse sequences of the brain and surrounding structures were obtained without intravenous contrast. COMPARISON:  Prior CTs from 03/04/2019. FINDINGS: Brain: Generalized age-related cerebral atrophy. Patchy T2/FLAIR hyperintensity within the periventricular and deep white matter both cerebral hemispheres most consistent with chronic small vessel ischemic disease, mild for age. Few scattered small remote bilateral cerebellar infarcts noted. Patchy restricted diffusion seen involving the left caudate and lentiform nuclei, consistent with acute left MCA territory infarcts. Single additional subcentimeter cortical infarct noted at the adjacent left frontal operculum. No associated hemorrhage or mass effect. No other evidence for acute or subacute ischemia. Gray-white matter differentiation otherwise maintained. No other areas of remote cortical infarction. No foci of susceptibility artifact to suggest acute or chronic intracranial hemorrhage. No mass lesion, midline shift or mass effect. No hydrocephalus. No extra-axial fluid collection. Pituitary gland within normal limits. Vascular: Major intracranial vascular flow voids are maintained. Skull and upper cervical spine: Craniocervical junction normal. Bone marrow signal intensity within normal limits. No scalp soft tissue abnormality. Sinuses/Orbits:  Patient status post ocular lens replacement on the left. Globes and orbital soft tissues demonstrate no acute finding. Scattered mucosal thickening noted throughout the paranasal sinuses, most notable within the right sphenoid sinus. Trace left mastoid effusion noted, of doubtful significance. Inner ear structures grossly normal. Other: None. IMPRESSION: 1. Acute ischemic left MCA territory infarcts primarily involving the left caudate and lentiform nuclei. No associated hemorrhage or mass effect. 2. No other acute intracranial abnormality. 3. Age-related cerebral atrophy with mild chronic small vessel ischemic disease. Electronically Signed   By: Jeannine Boga M.D.   On: 03/04/2019 23:01   EP PPM/ICD IMPLANT  Result Date: 03/07/2019 SURGEON:  Allegra Lai, MD   PREPROCEDURE DIAGNOSIS:  Cryptogenic Stroke   POSTPROCEDURE DIAGNOSIS:  Cryptogenic Stroke    PROCEDURES:  1. Implantable loop recorder implantation   INTRODUCTION:  Conan Mcmanaway is a 86 y.o. male with a history of unexplained stroke who presents today for implantable loop implantation.  The patient has had a cryptogenic stroke.  Despite an extensive workup by neurology, no reversible causes have been identified.  he has worn telemetry during which he did not have arrhythmias.  There is significant concern for  possible atrial fibrillation as the cause for the patients stroke.  The patient therefore presents today for implantable loop implantation.   DESCRIPTION OF PROCEDURE:  Informed written consent was obtained, and the patient was brought to the electrophysiology lab in a fasting state.  The patient required no sedation for the procedure today.  Mapping over the patient's chest was performed by the EP lab staff to identify the area where electrograms were most prominent for ILR recording.  This area was found to be the left parasternal region over the 3rd-4th intercostal space. The patients left chest was therefore prepped and draped in the  usual sterile fashion by the EP lab staff. The skin overlying the left parasternal region was infiltrated with lidocaine for local analgesia.  A 0.5-cm incision was made over the left parasternal region over the 3rd intercostal space.  A subcutaneous ILR pocket was fashioned using a combination of sharp and blunt dissection.  A Medtronic Reveal [place] model [id number] implantable loop recorder was then placed into the pocket  R waves were very prominent and measured 0.54m. EBL<1 ml.  Steri- Strips and a sterile dressing were then applied.  There were no early apparent complications.   CONCLUSIONS:  1. Successful implantation of a Medtronic Reveal LINQ implantable loop recorder for cryptogenic stroke  2. No early apparent complications.   IR [place] Bx Asp/Drain  Result Date: 03/07/2019 INDICATION: 86year old male status post large vessel stroke thrombectomy now complicated by small pseudoaneurysm formation at the right common femoral arterial access. He is symptomatic and continues to bleed at the puncture site. EXAM: Ultrasound-guided thrombin injection of pseudoaneurysm MEDICATIONS: None ANESTHESIA/SEDATION: None CONTRAST:  None FLUOROSCOPY TIME:  None COMPLICATIONS: None immediate. PROCEDURE: Informed consent was obtained from the patient following explanation of the procedure, risks, benefits and alternatives. The patient understands, agrees and consents for the procedure. All questions were addressed. A time out was performed prior to the initiation of the procedure. Ultrasound was used to interrogate the right groin. There is a small pulsatile pseudoaneurysm measuring approximately 1.4 x 0.8 cm. A suitable skin entry site was selected and marked. The region was sterilely prepped and draped in the standard fashion using chlorhexidine skin prep. A 21 gauge micropuncture needle was then carefully advanced under real-time ultrasound guidance and used to puncture the pseudoaneurysm. There was return of  pulsatile blood. Bovine thrombin was reconstituted with a concentration of 5000 mcg in 5 mL sterile saline. 1 mL was drawn up in a medallion syringe and carefully injected in small aliquots in till there was evidence of thrombosis of the pseudoaneurysm on ultrasound. Approximately 200 mcg was injected. The needle was removed.  Sterile bandage was placed. The distal pulses at the foot were evaluated before and after the injection procedure. There was no change in the easily palpable dorsalis pedis pulse. IMPRESSION: Successful thrombin injection with thrombosis of the right common femoral artery pseudoaneurysm. Electronically Signed   By: HJacqulynn CadetM.D.   On: 03/07/2019 16:15   VAS [place] PSEUDOANEURYSM  Result Date: 03/07/2019  ARTERIAL PSEUDOANEURYSM  Exam: Right groin Indications: Patient complains of palpable knot and bleeding. History: S/p catheterization. Comparison Study: No prior study. Performing Technologist: MMaudry MayhewMHA, RDMS, RVT, RDCS  Examination Guidelines: A complete evaluation includes B-mode imaging, spectral Doppler, color Doppler, and power Doppler as needed of all accessible portions of each vessel. Bilateral testing is considered an integral part of a complete examination. Limited examinations for reoccurring indications may be performed as  noted. +------------+----------+--------+------+----------+  Right Duplex PSV (cm/s) Waveform Plaque Comment(s)  +------------+----------+--------+------+----------+  CFA             152     biphasic                    +------------+----------+--------+------+----------+ Right Vein comments:patent right CFV  Findings: An area with well defined borders measuring 0.9 cm x 0.5 cm was visualized arising off of the right CFA with ultrasound characteristics of a pseudoaneurysm. Mixed echos within the structure suggest that it is partially thrombosed with a residual diameter  of 0.8 cm x 0.5 cm. The neck measures approximately 0.3 cm wide and  0.5 cm long. Distal right PTA is patent with biphasic flow.  Diagnosing physician: Curt Jews MD Electronically signed by Curt Jews MD on 03/07/2019 at 4:41:44 PM.   --------------------------------------------------------------------------------    Final    ECHOCARDIOGRAM COMPLETE  Result Date: 03/05/2019   ECHOCARDIOGRAM REPORT   Patient Name:   BURTON GAHAN Date of Exam: 03/05/2019 Medical Rec #:  [id number]     Height:       68.0 in Accession #:    [id number]    Weight:       138.0 lb Date of Birth:  03/13/1925     BSA:          1.75 m Patient Age:    23 years      BP:           161/65 mmHg Patient Gender: M             HR:           89 bpm. Exam Location:  Inpatient Procedure: 2D Echo Indications:    434.91 stroke  History:        Patient has prior history of Echocardiogram examinations, most                 recent 11/21/2012. Risk Factors:Hypertension and Dyslipidemia.  Sonographer:    Jannett Celestine RDCS (AE) Referring Phys: [phone] ERIC LINDZEN  Sonographer Comments: Suboptimal subcostal window. IMPRESSIONS  1. Left ventricular ejection fraction, by visual estimation, is 65 to 70%. The left ventricle has normal function. There is moderately increased left ventricular hypertrophy.  2. Left ventricular diastolic parameters are consistent with Grade I diastolic dysfunction (impaired relaxation).  3. The left ventricle has no regional wall motion abnormalities.  4. Global right ventricle has normal systolic function.The right ventricular size is normal. No increase in right ventricular wall thickness.  5. Left atrial size was normal.  6. Right atrial size was normal.  7. The mitral valve is normal in structure. Trivial mitral valve regurgitation. No evidence of mitral stenosis.  8. The tricuspid valve is normal in structure.  9. The aortic valve is tricuspid. Aortic valve regurgitation is mild. Mild aortic valve sclerosis without stenosis. 10. The pulmonic valve was normal in structure. Pulmonic valve regurgitation  is not visualized. 11. Aortic dilatation noted. 12. There is mild dilatation of the ascending aorta measuring 40 mm. 13. Severely elevated pulmonary artery systolic pressure. 14. The atrial septum is grossly normal. FINDINGS  Left Ventricle: Left ventricular ejection fraction, by visual estimation, is 65 to 70%. The left ventricle has normal function. The left ventricle has no regional wall motion abnormalities. There is moderately increased left ventricular hypertrophy. Concentric left ventricular hypertrophy. Left ventricular diastolic parameters are consistent with Grade I diastolic dysfunction (impaired relaxation). Right Ventricle: The right ventricular size is normal. No increase  in right ventricular wall thickness. Global RV systolic function is has normal systolic function. Left Atrium: Left atrial size was normal in size. Right Atrium: Right atrial size was normal in size Pericardium: There is no evidence of pericardial effusion. Mitral Valve: The mitral valve is normal in structure. Trivial mitral valve regurgitation. No evidence of mitral valve stenosis by observation. Tricuspid Valve: The tricuspid valve is normal in structure. Tricuspid valve regurgitation is mild. Aortic Valve: The aortic valve is tricuspid. Aortic valve regurgitation is mild. Mild aortic valve sclerosis is present, with no evidence of aortic valve stenosis. Mild aortic valve annular calcification. Pulmonic Valve: The pulmonic valve was normal in structure. Pulmonic valve regurgitation is not visualized. Pulmonic regurgitation is not visualized. Aorta: Aortic dilatation noted. There is mild dilatation of the ascending aorta measuring 40 mm. IAS/Shunts: The atrial septum is grossly normal.  LEFT VENTRICLE PLAX 2D LVIDd:         3.11 cm  Diastology LVIDs:         1.88 cm  LV e' lateral:   8.70 cm/s LV PW:         1.32 cm  LV E/e' lateral: 6.9 LV IVS:        1.40 cm  LV e' medial:    8.16 cm/s LVOT diam:     2.20 cm  LV E/e' medial:  7.4  LV SV:         27 ml LV SV Index:   15.81 LVOT Area:     3.80 cm  LEFT ATRIUM             Index       RIGHT ATRIUM           Index LA diam:        2.60 cm 1.49 cm/m  RA Area:     13.80 cm LA Vol (A2C):   50.7 ml 29.04 ml/m RA Volume:   28.00 ml  16.04 ml/m LA Vol (A4C):   35.7 ml 20.45 ml/m LA Biplane Vol: 45.1 ml 25.84 ml/m  AORTIC VALVE LVOT Vmax:   95.10 cm/s LVOT Vmean:  68.900 cm/s LVOT VTI:    0.210 m  AORTA Ao Root diam: 3.90 cm MITRAL VALVE                        TRICUSPID VALVE MV Area (PHT): 3.77 cm             TR Peak grad:   58.1 mmHg MV PHT:        58.29 msec           TR Vmax:        416.00 cm/s MV Decel Time: 201 msec MV E velocity: 60.00 cm/s 103 cm/s  SHUNTS MV A velocity: 84.50 cm/s 70.3 cm/s Systemic VTI:  0.21 m MV E/A ratio:  0.71       1.5       Systemic Diam: 2.20 cm  [name] MD Electronically signed by [name] MD Signature Date/Time: 03/05/2019/1:08:04 PM    Final     Labs:  CBC: Recent Labs    03/03/19 2237 03/03/19 2247 03/04/19 1537 03/06/19 0301 03/06/19 2338  WBC 16.6*  --  14.8* 12.0* 13.0*  HGB 13.8 15.0 12.8* 11.4* 11.8*  HCT 41.3 44.0 37.6* 34.4* 35.7*  PLT 677*  --  591* 481* 491*    COAGS: Recent Labs    03/03/19 2237  INR 1.1  APTT  38*    BMP: Recent Labs    03/03/19 0339 03/03/19 2237 03/03/19 2247 03/04/19 1537 03/06/19 2338  NA 132* 131* 131* 135 130*  K 3.8 3.8 3.8 3.9 3.9  CL 96* 97* 95* 102 94*  CO2 30 25  --  26 29  GLUCOSE 139* 167* 159* 148* 127*  BUN 14 19 23 15 19   CALCIUM 8.6* 9.2  --  8.4* 8.4*  CREATININE 0.78 1.17 1.10 0.93 1.07  GFRNONAA >60 54*  --  >60 >60  GFRAA >60 >60  --  >60 >60    LIVER FUNCTION TESTS: Recent Labs    09/28/18 1344 11/27/18 1339 03/03/19 2237 03/04/19 1537  BILITOT 0.8 1.2 1.0 0.9  AST 18 16 21 21   ALT 11 14 17 18   ALKPHOS 79 60 58 59  PROT 6.7 6.7 6.1* 5.3*  ALBUMIN 4.6 4.3 3.5 3.0*    Assessment and Plan:  86 y/o M s/p emergent mechanical thrombectomy of left  MCA M1 segment occlusionvia right femoral approachachieving a TICI 3 revascularization 03/03/2019 by Dr. Earleen Newport; with subsequent development of right CFA pseudoaneurysm seen on VAS [place] yesterday. Patient underwent image guided thrombin injection of right CFA pseudoaneurysm yesterday in IR.  Patient denies complaints currently, neurovascularly intact on exam today. Per RN has been NPO, received ASA + Plavix today. Afebrile, WBC 13.0, hgb 11.8, plt 491.   Follow up right CFA [place] today shows small persistent pseudoaneurysm. Due to concern for persistent pseudoaneurysm we will consult vascular surgery prior to potential discharge.   Ok to sit up in bed, hold PT until seen by vascular surgery, may have meds with sips.  Please see Dr. Pasty Arch attestation for further details.    Electronically Signed: Joaquim Nam, PA-C 03/08/2019, 12:42 PM   I spent a total of 15 Minutes at the the patient's bedside AND on the patient's hospital floor or unit, greater than 50% of which was counseling/coordinating care for right CFA pseudoaneurysm.

## 2019-03-08 NOTE — Plan of Care (Signed)
  Problem: Education: Goal: Knowledge of secondary prevention will improve Outcome: Progressing Goal: Knowledge of patient specific risk factors addressed and post discharge goals established will improve Outcome: Progressing Goal: Individualized Educational Video(s) Outcome: Progressing   

## 2019-03-08 NOTE — Discharge Instructions (Signed)
Pseudoaneurysm  An aneurysm is a bulge in an artery. A pseudoaneurysm happens when an artery is injured and blood leaks out and forms a sac-like bulge in the surrounding tissues. What are the causes? The most common cause of this condition is a procedure called an angiogram. During this procedure, a small, thin tube (catheter) is inserted into an artery. After an angiogram, the insertion site on the artery should close back up all the way. If it does not, blood may leak out of the artery. Other causes of a pseudoaneurysm include:  Trauma to the walls of an artery, such as from a stabbing injury or a deep cut.  Bypass artery grafting surgery, which is a type of surgery that makes blood flow to the heart better.  An infection that affects the walls of an artery.  A heart attack (myocardial infarction). What are the signs or symptoms? Symptoms of this condition include:  Pain, soreness, or tenderness at the site of the pseudoaneurysm.  Swelling.  Bruising or a change in skin color.  A throbbing mass or lump at the site. How is this diagnosed? This condition may be diagnosed based on:  Your symptoms.  A physical exam.  An imaging test called a Doppler ultrasound. This imaging test uses sound waves to show the blood flow in the arteries and the pseudoaneurysm. How is this treated? This condition may go away on its own without treatment. To help prevent bleeding that cannot be controlled, or to help prevent other problems, your health care provider may suggest one of these treatments:  Injecting a blood-clotting enzyme, such as thrombin, into the site.  Fixing the artery with surgery.  Putting pressure (compression) on the pseudoaneurysm. Follow these instructions at home:  Take over-the-counter and prescription medicines only as told by your health care provider.  Return to your normal activities as told by your health care provider. Ask your health care provider what  activities are safe for you.  Keep all follow-up visits as told by your health care provider. This is important. Contact a health care provider if:  Your pain, soreness, or tenderness at the pseudoaneurysm site keeps getting worse.  You have swelling at the site. Get help right away if:  You have severe or ongoing (persistent) pain at the site of the pseudoaneurysm.  There is bleeding or drainage from the site.  The part of your body where the pseudoaneurysm is located changes color or becomes painful, cold, or numb.  You have chest pain or shortness of breath.  You feel like you might faint or you faint. Summary  A pseudoaneurysm happens when an artery is injured and blood leaks out to form a sac-like bulge.  The most common cause of this condition is a procedure called an angiogram in which a thin tube (catheter) is inserted into an artery.  This condition may go away on its own without treatment.  Take over-the-counter and prescription medicines only as told by your health care provider.  Get help right away if the part of your body where the pseudoaneurysm is located changes color or becomes painful, cold, or numb. This information is not intended to replace advice given to you by your health care provider. Make sure you discuss any questions you have with your health care provider. Document Revised: 11/23/2017 Document Reviewed: 11/23/2017 Elsevier Patient Education  2020 [place] After Your Loop Recorder  . You have a Medtronic Loop Recorder   . Monitor your cardiac device  site for redness, swelling, and drainage. Call the device clinic at [phone] if you experience these symptoms or fever/chills.  . You may shower 3 days after your loop recorder implant and wash your incision with soap and water. Avoid lotions, ointments, or perfumes over your incision until it is well-healed.  . You may use a hot tub or a pool AFTER your wound check appointment if the  incision is completely closed.  . Your device is MRI compatible.   . Remote monitoring is used to monitor your cardiac device from home. This monitoring is scheduled every month days by our office. It allows [place] to keep an eye on the functioning of your device to ensure it is working properly.

## 2019-03-08 NOTE — Progress Notes (Signed)
Physical Therapy Treatment Patient Details Name: Jimmy Jimenez MRN: [id number] DOB: 03/13/1925 Today's Date: 03/08/2019    History of Present Illness Jimmy Jimenez is an 86 y.o. male who was recently discharged from [facility] on Saturday afternoon after having been admitted there for treatment of acute on chronic respiratory failure with hypoxia. He presented to the ED at [facility] on Saturday night after being noted to be suddenly aphasic with right sided weakness at home. CT showing L MCA M2 infarct and more in a watershed distribution.  PMH: HTN, osteoporosis    PT Comments    Pt still moving slowly with little to no assist, but due to 2 days of inactivity, may need more assist from his wife until HHPT starts up.  Pt and his wife should still be safe to manage immediately at home.    Follow Up Recommendations  Home health PT;Supervision - Intermittent     Equipment Recommendations  Other (comment)(pt has a cane and should practice with it in [place])    Recommendations for Other Services       Precautions / Restrictions Precautions Precautions: Fall    Mobility  Bed Mobility Overal bed mobility: Needs Assistance Bed Mobility: Supine to Sit     Supine to sit: Min assist     General bed mobility comments: pt need minimal truncal assist to come up onto R elbow and minimal stability assist to support his trunk so he could get his right hand ready to push up.  Slow to scoot.  Transfers Overall transfer level: Needs assistance   Transfers: Sit to/from Stand Sit to Stand: Min assist         General transfer comment: pt came up to stand with flexed and mildly weak truncal and then needed stability assist to stand fully upright.  Ambulation/Gait Ambulation/Gait assistance: Min assist;Min guard Gait Distance (Feet): 80 Feet(total, some with min assist and a short distance- min guard) Assistive device: None Gait Pattern/deviations: Step-through pattern Gait velocity: slower and  guarded   General Gait Details: steady, guarded, flexed posture, pt fatigued after a short distance./   Stairs             Wheelchair Mobility    Modified Rankin (Stroke Patients Only) Modified Rankin (Stroke Patients Only) Pre-Morbid Rankin Score: No significant disability Modified Rankin: Moderate disability     Balance Overall balance assessment: Needs assistance Sitting-balance support: No upper extremity supported Sitting balance-Leahy Scale: Good       Standing balance-Leahy Scale: Fair                              Cognition Arousal/Alertness: Awake/alert Behavior During Therapy: WFL for tasks assessed/performed;Flat affect Overall Cognitive Status: Within Functional Limits for tasks assessed                                        Exercises      General Comments        Pertinent Vitals/Pain Pain Assessment: No/denies pain Pain Intervention(s): Monitored during session    Home Living                      Prior Function            PT Goals (current goals can now be found in the care plan section) Acute Rehab PT Goals Patient Stated  Goal: home when ready PT Goal Formulation: With patient Time For Goal Achievement: 03/11/19 Potential to Achieve Goals: Good Progress towards PT goals: Progressing toward goals    Frequency    Min 4X/week      PT Plan Current plan remains appropriate    Co-evaluation              AM-PAC PT "6 Clicks" Mobility   Outcome Measure  Help needed turning from your back to your side while in a flat bed without using bedrails?: A Little Help needed moving from lying on your back to sitting on the side of a flat bed without using bedrails?: A Little Help needed moving to and from a bed to a chair (including a wheelchair)?: A Little Help needed standing up from a chair using your arms (e.g., wheelchair or bedside chair)?: A Little Help needed to walk in hospital room?: A  Little Help needed climbing 3-5 steps with a railing? : A Little 6 Click Score: 18    End of Session   Activity Tolerance: Patient tolerated treatment well Patient left: in chair;with call bell/phone within reach;with family/visitor present Nurse Communication: Mobility status PT Visit Diagnosis: Other symptoms and signs involving the nervous system (R29.898);Muscle weakness (generalized) (M62.81)     Time: 1440-1501 PT Time Calculation (min) (ACUTE ONLY): 21 min  Charges:  $Gait Training: 8-22 mins                     03/08/2019  Jimmy Jimenez., PT Acute Rehabilitation Services [phone]  (pager) [phone]  (office)   Jimmy Jimenez Jimmy Jimenez 03/08/2019, 3:16 PM

## 2019-03-08 NOTE — Plan of Care (Signed)
  Problem: Education: Goal: Knowledge of disease or condition will improve Outcome: Adequate for Discharge Goal: Knowledge of secondary prevention will improve Outcome: Adequate for Discharge Goal: Knowledge of patient specific risk factors addressed and post discharge goals established will improve Outcome: Adequate for Discharge Goal: Individualized Educational Video(s) Outcome: Adequate for Discharge   Problem: Coping: Goal: Will verbalize positive feelings about self Outcome: Adequate for Discharge Goal: Will identify appropriate support needs Outcome: Adequate for Discharge   Pt A/O x4, vital signs within normal limits, adequate for discharge. Wife verbalizes understanding of discharge paperwork. Pt leaves in private vehicle with son and wife

## 2019-03-08 NOTE — Progress Notes (Signed)
OT Cancellation Note  Patient Details Name: Jimmy Jimenez MRN: [id number] DOB: 03/13/1925   Cancelled Treatment:    Reason Eval/Treat Not Completed: Other (comment)(Pt was having ECHO earlier and awaiting vascular duplex)   OT to continue to follow per POC.  Jefferey Pica OTR/L Acute Rehabilitation Services Pager: [phone] Office: [phone]   Jefferey Pica 03/08/2019, 3:14 PM

## 2019-03-08 NOTE — Discharge Summary (Addendum)
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 Active Problems:   Essential thrombocytosis ([place])   Hyperlipidemia   Essential hypertension   Hyponatremia   Pulmonary hypertension, mild (HCC)   Pulmonary fibrosis ([place])   Sacral decubitus ulcer, stage II ([place])   Chronic diastolic CHF (congestive heart failure) ([place])   Pressure injury of skin   Pseudoaneurysm ([place]) R groin   Leukocytosis   Acute blood loss anemia   Mild malnutrition ([place])   Allergies as of 03/08/2019       Reactions   Amlodipine    swelling   Atenolol    swelling   Erythromycin    Other reaction(s): Hallucination   Furosemide    Dizziness   Hctz [hydrochlorothiazide]    hyponatremia   Hydrochlorothiazide W-triamterene    Other reaction(s): Other (See Comments) Patient stated is causes low sodium Patient stated is causes low sodium Patient stated is causes low sodium Patient stated is causes low sodium Patient stated is causes low sodium Patient stated is causes low sodium   Levofloxacin    Causes sleepiness   Metoprolol    dizziness   Micardis [telmisartan]    Causes swelling   Penicillins Itching   Has patient had a PCN reaction causing immediate rash, facial/tongue/throat swelling, SOB or lightheadedness with hypotension: No Has patient had a PCN reaction causing severe rash involving mucus membranes or skin necrosis: No Has patient had a PCN reaction that required hospitalization No Has patient had a PCN reaction occurring within the last 10 years: No If  all of the above answers are "NO", then may proceed with Cephalosporin use.   Terazosin    Causes swelling in legs and feet   Triamterene-hctz    Patient stated is causes low sodium        Medication List     TAKE these medications    albuterol 108 (90 Base) MCG/ACT inhaler Commonly known as: VENTOLIN HFA Inhale 2 puffs into the lungs every 6 (six) hours as needed for wheezing or shortness of breath.   amLODipine 5 MG tablet Commonly known as: NORVASC TAKE 1 TABLET BY MOUTH ONCE DAILY   aspirin EC 81 MG tablet Take 1 tablet (81 mg total) by mouth daily for 21 days. Reported on 08/14/2015   atorvastatin 10 MG tablet Commonly known as: LIPITOR Take 1 tablet (10 mg total) by mouth daily at 6 PM.   azelastine 0.1 % nasal spray Commonly known as: ASTELIN Place 2 sprays into both nostrils 2 (two) times daily.   B-12 Compliance Injection 1000 MCG/ML Kit Generic drug: Cyanocobalamin Inject 1,000 mcg as directed every 30 (thirty) days.   cyanocobalamin 1000 MCG tablet Take 5,000 mcg by mouth daily.   benzonatate 200 MG capsule Commonly known as: TESSALON Take 1 capsule (200 mg total) by mouth 2 (two) times daily as needed for cough.   CITRACAL + D PO Take 2 tablets by mouth daily. Take 2 by mouth daily, calcium 630 and vitamin D 500 each.   clopidogrel 75 MG tablet Commonly known as:  PLAVIX Take 1 tablet (75 mg total) by mouth daily. Start taking on: March 09, 2019   clotrimazole-betamethasone cream Commonly known as: LOTRISONE Apply topically 2 (two) times daily.   Dermacloud Crea Apply 1 application topically as needed (after each bowel movement for barrier for  pressure ulcer.).   docusate sodium 100 MG capsule Commonly known as: COLACE Take 100 mg by mouth 2 (two) times daily as needed for mild constipation.   doxycycline 100 MG tablet Commonly known as: VIBRA-TABS Take 1 tablet (100 mg total) by mouth every 12 (twelve) hours for 6 days.   esomeprazole 40  MG capsule Commonly known as: NEXIUM TAKE 1 CAPSULE BY MOUTH ONCE DAILY AT NOON What changed: See the new instructions.   feeding supplement (ENSURE ENLIVE) Liqd Take 237 mLs by mouth 2 (two) times daily between meals.   furosemide 20 MG tablet Commonly known as: LASIX TAKE 1 TABLET BY MOUTH EVERY OTHER DAY IN MORNING What changed: See the new instructions.   hydrALAZINE 50 MG tablet Commonly known as: APRESOLINE TAKE 1 TABLET BY MOUTH 3 TIMES DAILY   hydroxyurea 500 MG capsule Commonly known as: HYDREA TAKE 1 CAPSULE BY MOUTH EVERY OTHER DAY MAY TAKE WITH FOOD TO MINIMIZE GI SIDE EFFECTS.   lisinopril 40 MG tablet Commonly known as: ZESTRIL TAKE 1 TABLET BY MOUTH ONCE DAILY   Qvar RediHaler 40 MCG/ACT inhaler Generic drug: beclomethasone Inhale 1 puff into the lungs 2 (two) times daily.   testosterone cypionate 200 MG/ML injection Commonly known as: DEPOTESTOSTERONE CYPIONATE INJECT 0.25 ML INTRAMUSCULARLY EVERY 14 DAYS AS DIRECTED   Vitamin D3 25 MCG (1000 UT) Caps Take 2,000 Units by mouth daily. Take 2 by mouth daily        LABORATORY STUDIES CBC    Component Value Date/Time   WBC 13.0 (H) 03/06/2019 2338   RBC 3.50 (L) 03/06/2019 2338   HGB 11.8 (L) 03/06/2019 2338   HGB 13.7 12/24/2013 0921   HCT 35.7 (L) 03/06/2019 2338   HCT 41.1 12/24/2013 0921   PLT 491 (H) 03/06/2019 2338   PLT 286 12/24/2013 0921   MCV 102.0 (H) 03/06/2019 2338   MCV 114 (H) 12/24/2013 0921   MCH 33.7 03/06/2019 2338   MCHC 33.1 03/06/2019 2338   RDW 11.9 03/06/2019 2338   RDW 12.5 12/24/2013 0921   LYMPHSABS 1.4 03/04/2019 1537   LYMPHSABS 1.3 12/24/2013 0921   MONOABS 1.8 (H) 03/04/2019 1537   MONOABS 0.6 12/24/2013 0921   EOSABS 0.1 03/04/2019 1537   EOSABS 0.1 12/24/2013 0921   BASOSABS 0.0 03/04/2019 1537   BASOSABS 0.0 12/24/2013 0921   CMP    Component Value Date/Time   NA 130 (L) 03/06/2019 2338   NA 136 05/19/2011 1049   K 3.9 03/06/2019 2338   K 4.3  05/19/2011 1049   CL 94 (L) 03/06/2019 2338   CL 99 05/19/2011 1049   CO2 29 03/06/2019 2338   CO2 32 05/19/2011 1049   GLUCOSE 127 (H) 03/06/2019 2338   GLUCOSE 116 (H) 05/19/2011 1049   BUN 19 03/06/2019 2338   BUN 8 05/19/2011 1049   CREATININE 1.07 03/06/2019 2338   CREATININE 1.15 12/24/2013 0921   CALCIUM 8.4 (L) 03/06/2019 2338   CALCIUM 9.0 05/19/2011 1049   PROT 5.3 (L) 03/04/2019 1537   PROT 6.8 12/24/2013 0921   ALBUMIN 3.0 (L) 03/04/2019 1537   ALBUMIN 4.0 12/24/2013 0921   AST 21 03/04/2019 1537   AST 23 12/24/2013 0921  ALT 18 03/04/2019 1537   ALT 19 12/24/2013 0921   ALKPHOS 59 03/04/2019 1537   ALKPHOS 57 12/24/2013 0921   BILITOT 0.9 03/04/2019 1537   BILITOT 0.9 12/24/2013 0921   GFRNONAA >60 03/06/2019 2338   GFRNONAA >60 12/24/2013 0921   GFRNONAA 50 (L) 09/03/2013 0930   GFRAA >60 03/06/2019 2338   GFRAA >60 12/24/2013 0921   GFRAA 58 (L) 09/03/2013 0930   COAGS Lab Results  Component Value Date   INR 1.1 03/03/2019   INR 1.02 08/31/2016   Lipid Panel    Component Value Date/Time   CHOL 158 03/04/2019 0449   TRIG 64 03/04/2019 0449   HDL 58 03/04/2019 0449   CHOLHDL 2.7 03/04/2019 0449   VLDL 13 03/04/2019 0449   LDLCALC 87 03/04/2019 0449   HgbA1C  Lab Results  Component Value Date   HGBA1C 4.9 03/04/2019   Urinalysis    Component Value Date/Time   COLORURINE YELLOW (A) 03/02/2019 0450   APPEARANCEUR CLEAR (A) 03/02/2019 0450   APPEARANCEUR Clear 03/10/2017 1324   LABSPEC 1.028 03/02/2019 0450   PHURINE 6.0 03/02/2019 0450   GLUCOSEU NEGATIVE 03/02/2019 0450   GLUCOSEU NEGATIVE 08/29/2014 1050   HGBUR NEGATIVE 03/02/2019 0450   BILIRUBINUR NEGATIVE 03/02/2019 0450   BILIRUBINUR Negative 03/10/2017 1324   KETONESUR NEGATIVE 03/02/2019 0450   PROTEINUR NEGATIVE 03/02/2019 0450   UROBILINOGEN 0.2 08/29/2014 1050   NITRITE NEGATIVE 03/02/2019 0450   LEUKOCYTESUR NEGATIVE 03/02/2019 0450    SIGNIFICANT DIAGNOSTIC STUDIES CT  Code Stroke CTA Head W/WO contrast  Result Date: 03/03/2019 CLINICAL DATA:  Initial evaluation for acute stroke. EXAM: CT ANGIOGRAPHY HEAD AND NECK CT PERFUSION BRAIN TECHNIQUE: Multidetector CT imaging of the head and neck was performed using the standard protocol during bolus administration of intravenous contrast. Multiplanar CT image reconstructions and MIPs were obtained to evaluate the vascular anatomy. Carotid stenosis measurements (when applicable) are obtained utilizing NASCET criteria, using the distal internal carotid diameter as the denominator. Multiphase CT imaging of the brain was performed following IV bolus contrast injection. Subsequent parametric perfusion maps were calculated using RAPID software. CONTRAST:  143m OMNIPAQUE IOHEXOL 350 MG/ML SOLN COMPARISON:  Prior noncontrast head CT from earlier same day. FINDINGS: CTA NECK FINDINGS Aortic arch: Visualized aortic arch of normal caliber with normal branch pattern. Moderate atherosclerotic change about the arch and origin of the great vessels without hemodynamically significant stenosis. Visualized subclavian arteries patent. Right carotid system: Right common and internal carotid arteries are mildly tortuous but widely patent without significant stenosis, dissection or occlusion. Left carotid system: Left common and internal carotid arteries are mildly tortuous but widely patent without significant stenosis, dissection or occlusion. Vertebral arteries: Both vertebral arteries arise from the subclavian arteries. Focal plaque at the origin of the right vertebral artery with associated 50% stenosis. Focal non stenotic plaque noted at the origin of the left vertebral artery as well. Vertebral arteries tortuous but otherwise patent within the neck without stenosis, dissection, or occlusion. Skeleton: No acute osseous abnormality. No worrisome lytic or blastic osseous lesions. Moderate multilevel cervical spondylosis without significant stenosis.  Other neck: No other acute soft tissue abnormality within the neck. No mass lesion or adenopathy. Upper chest: Mildly enlarged and partially calcified 12 mm precarinal lymph node noted. Patchy multifocal ground-glass opacities seen within the partially visualized lungs, similar to previous. Superimposed scattered subpleural reticular densities and fibrotic changes. Superimposed 9 mm nodular density at the posterior left upper lobe (series 5, image 169). Additional 9 mm  pleural base nodular density partially visualized at the anterior right upper lobe (series 5, image 189). Review of the MIP images confirms the above findings CTA HEAD FINDINGS Anterior circulation: Scattered calcified plaque throughout the petrous, cavernous, and supraclinoid ICAs with associated moderate to severe multifocal narrowing. A1 segments patent. Normal anterior communicating artery. Anterior cerebral arteries widely patent to their distal aspects without stenosis. Left M1 patent. There is occlusive thrombus within the proximal left M2 branch, superior division. Attenuated collateral flow seen distally. Anterior temporal/inferior division remains patent. Right M1 widely patent. Normal right MCA bifurcation. Short-segment severe stenosis at the origin of the right M2 inferior division. Right MCA branches well perfused distally. Posterior circulation: Calcified atheromatous plaque within the dominant left V4 segment with associated moderate multifocal stenosis. Diminutive right vertebral artery widely patent the vertebrobasilar junction. Posterior inferior cerebral arteries patent bilaterally. Basilar widely patent to its distal aspect. Superior cerebral arteries patent bilaterally. Both PCAs primarily supplied via the basilar. Short-segment severe distal left P2 stenosis noted. PCAs otherwise patent to their distal aspects without stenosis. Venous sinuses: Patent. Anatomic variants: None significant. Review of the MIP images confirms the  above findings CT Brain Perfusion Findings: ASPECTS: 9 CBF (<30%) Volume: 17m Perfusion (Tmax>6.0s) volume: 577mMismatch Volume: 4428mnfarction Location:Patchy small volume acute core infarct seen involving the deep white matter of the left cerebral hemisphere, somewhat watershed in distribution. Surrounding ischemic penumbra of 55 cc, with mismatch volume of 44 cc. IMPRESSION: CTA HEAD AND NECK IMPRESSION: 1. Positive CTA for LVO with occlusive thrombus within a proximal left M2 branch, superior division. 2. Heavy calcified plaque throughout the carotid siphons with associated moderate to severe multifocal narrowing. 3. Short-segment severe proximal right M2 and distal left P2 stenoses. 4. Approximate 50% atheromatous stenoses at the origin of the right vertebral artery as well as involving the left V4 segment. CT PERFUSION IMPRESSION: 1. 11 cc acute core infarct involving the deep white matter of the left cerebral hemisphere, watershed in distribution. Surrounding 55 cc ischemic penumbra involving much of the left MCA. 2. Aspects equals 9 on prior head CT. These results were communicated to Dr. LinCheral Marker 11:22 pmon 1/2/2021by text page via the AMIH[facility]aging system. Electronically Signed   By: BenJeannine BogaD.   On: 03/03/2019 23:44   DG Chest 2 View  Result Date: 03/01/2019 CLINICAL DATA:  Shortness of breath, cough EXAM: CHEST - 2 VIEW COMPARISON:  09/28/2018 FINDINGS: Stable cardiomediastinal contours. Calcified thoracic aorta. Chronic interstitial lung changes without definite new superimposed airspace opacity. No pleural effusion or pneumothorax. IMPRESSION: Chronic interstitial lung changes without definite superimposed airspace opacity Electronically Signed   By: NicDavina PokeO.   On: 03/01/2019 16:48   CT Code Stroke CTA Neck W/WO contrast  Result Date: 03/03/2019 CLINICAL DATA:  Initial evaluation for acute stroke. EXAM: CT ANGIOGRAPHY HEAD AND NECK CT PERFUSION BRAIN  TECHNIQUE: Multidetector CT imaging of the head and neck was performed using the standard protocol during bolus administration of intravenous contrast. Multiplanar CT image reconstructions and MIPs were obtained to evaluate the vascular anatomy. Carotid stenosis measurements (when applicable) are obtained utilizing NASCET criteria, using the distal internal carotid diameter as the denominator. Multiphase CT imaging of the brain was performed following IV bolus contrast injection. Subsequent parametric perfusion maps were calculated using RAPID software. CONTRAST:  115m91mNIPAQUE IOHEXOL 350 MG/ML SOLN COMPARISON:  Prior noncontrast head CT from earlier same day. FINDINGS: CTA NECK FINDINGS Aortic arch: Visualized aortic arch of normal caliber with  normal branch pattern. Moderate atherosclerotic change about the arch and origin of the great vessels without hemodynamically significant stenosis. Visualized subclavian arteries patent. Right carotid system: Right common and internal carotid arteries are mildly tortuous but widely patent without significant stenosis, dissection or occlusion. Left carotid system: Left common and internal carotid arteries are mildly tortuous but widely patent without significant stenosis, dissection or occlusion. Vertebral arteries: Both vertebral arteries arise from the subclavian arteries. Focal plaque at the origin of the right vertebral artery with associated 50% stenosis. Focal non stenotic plaque noted at the origin of the left vertebral artery as well. Vertebral arteries tortuous but otherwise patent within the neck without stenosis, dissection, or occlusion. Skeleton: No acute osseous abnormality. No worrisome lytic or blastic osseous lesions. Moderate multilevel cervical spondylosis without significant stenosis. Other neck: No other acute soft tissue abnormality within the neck. No mass lesion or adenopathy. Upper chest: Mildly enlarged and partially calcified 12 mm precarinal  lymph node noted. Patchy multifocal ground-glass opacities seen within the partially visualized lungs, similar to previous. Superimposed scattered subpleural reticular densities and fibrotic changes. Superimposed 9 mm nodular density at the posterior left upper lobe (series 5, image 169). Additional 9 mm pleural base nodular density partially visualized at the anterior right upper lobe (series 5, image 189). Review of the MIP images confirms the above findings CTA HEAD FINDINGS Anterior circulation: Scattered calcified plaque throughout the petrous, cavernous, and supraclinoid ICAs with associated moderate to severe multifocal narrowing. A1 segments patent. Normal anterior communicating artery. Anterior cerebral arteries widely patent to their distal aspects without stenosis. Left M1 patent. There is occlusive thrombus within the proximal left M2 branch, superior division. Attenuated collateral flow seen distally. Anterior temporal/inferior division remains patent. Right M1 widely patent. Normal right MCA bifurcation. Short-segment severe stenosis at the origin of the right M2 inferior division. Right MCA branches well perfused distally. Posterior circulation: Calcified atheromatous plaque within the dominant left V4 segment with associated moderate multifocal stenosis. Diminutive right vertebral artery widely patent the vertebrobasilar junction. Posterior inferior cerebral arteries patent bilaterally. Basilar widely patent to its distal aspect. Superior cerebral arteries patent bilaterally. Both PCAs primarily supplied via the basilar. Short-segment severe distal left P2 stenosis noted. PCAs otherwise patent to their distal aspects without stenosis. Venous sinuses: Patent. Anatomic variants: None significant. Review of the MIP images confirms the above findings CT Brain Perfusion Findings: ASPECTS: 9 CBF (<30%) Volume: 76m Perfusion (Tmax>6.0s) volume: 531mMismatch Volume: 4447mnfarction Location:Patchy small  volume acute core infarct seen involving the deep white matter of the left cerebral hemisphere, somewhat watershed in distribution. Surrounding ischemic penumbra of 55 cc, with mismatch volume of 44 cc. IMPRESSION: CTA HEAD AND NECK IMPRESSION: 1. Positive CTA for LVO with occlusive thrombus within a proximal left M2 branch, superior division. 2. Heavy calcified plaque throughout the carotid siphons with associated moderate to severe multifocal narrowing. 3. Short-segment severe proximal right M2 and distal left P2 stenoses. 4. Approximate 50% atheromatous stenoses at the origin of the right vertebral artery as well as involving the left V4 segment. CT PERFUSION IMPRESSION: 1. 11 cc acute core infarct involving the deep white matter of the left cerebral hemisphere, watershed in distribution. Surrounding 55 cc ischemic penumbra involving much of the left MCA. 2. Aspects equals 9 on prior head CT. These results were communicated to Dr. LinCheral Marker 11:22 pmon 1/2/2021by text page via the [facility] Centers[facility]aging system. Electronically Signed   By: BenJeannine BogaD.   On: 03/03/2019 23:44  CT Angio Chest PE W and/or Wo Contrast  Result Date: 03/02/2019 CLINICAL DATA:  Shortness of breath. EXAM: CT ANGIOGRAPHY CHEST WITH CONTRAST TECHNIQUE: Multidetector CT imaging of the chest was performed using the standard protocol during bolus administration of intravenous contrast. Multiplanar CT image reconstructions and MIPs were obtained to evaluate the vascular anatomy. CONTRAST:  6m OMNIPAQUE IOHEXOL 350 MG/ML SOLN COMPARISON:  Radiograph yesterday. Chest CT 04/20/2016 had an outside institution. FINDINGS: Cardiovascular: There are no filling defects within the pulmonary arteries to suggest pulmonary embolus. Mild prominence of the left and right main pulmonary arteries measuring 2.7 and 2.8 cm respectively. Aortic atherosclerosis and tortuosity. No aortic aneurysm or dissection. Coronary artery calcifications. Normal  heart size with slight right heart dilatation. Contrast refluxes into the hepatic veins and IVC. Mediastinum/Nodes: Calcified mediastinal and hilar lymph nodes. No noncalcified adenopathy. No esophageal wall thickening. No suspicious thyroid nodule. Lungs/Pleura: Bilateral subpleural reticulation in a basilar predominant distribution, similar to prior exam. Slight increased density within the dependent right greater than left lower lobe reticulation may represent pulmonary microlithiasis, this is also unchanged. No acute airspace disease. No pulmonary edema or pleural effusion. Trachea and mainstem bronchi are patent. No pulmonary mass. Upper Abdomen: Contrast refluxes into the hepatic veins and IVC. No other acute finding. Musculoskeletal: There are no acute or suspicious osseous abnormalities. Review of the MIP images confirms the above findings. IMPRESSION: 1. No pulmonary embolus. 2. Contrast reflux into the hepatic veins and IVC, suggesting elevated right heart pressures. 3. Unchanged chronic interstitial lung disease since 2018, likely pulmonary fibrosis/UIP. Aortic Atherosclerosis (ICD10-I70.0). Electronically Signed   By: MKeith RakeM.D.   On: 03/02/2019 03:06   MR BRAIN WO CONTRAST  Result Date: 03/04/2019 CLINICAL DATA:  Initial evaluation for acute stroke, status post tPA. EXAM: MRI HEAD WITHOUT CONTRAST TECHNIQUE: Multiplanar, multiecho pulse sequences of the brain and surrounding structures were obtained without intravenous contrast. COMPARISON:  Prior CTs from 03/04/2019. FINDINGS: Brain: Generalized age-related cerebral atrophy. Patchy T2/FLAIR hyperintensity within the periventricular and deep white matter both cerebral hemispheres most consistent with chronic small vessel ischemic disease, mild for age. Few scattered small remote bilateral cerebellar infarcts noted. Patchy restricted diffusion seen involving the left caudate and lentiform nuclei, consistent with acute left MCA territory  infarcts. Single additional subcentimeter cortical infarct noted at the adjacent left frontal operculum. No associated hemorrhage or mass effect. No other evidence for acute or subacute ischemia. Gray-white matter differentiation otherwise maintained. No other areas of remote cortical infarction. No foci of susceptibility artifact to suggest acute or chronic intracranial hemorrhage. No mass lesion, midline shift or mass effect. No hydrocephalus. No extra-axial fluid collection. Pituitary gland within normal limits. Vascular: Major intracranial vascular flow voids are maintained. Skull and upper cervical spine: Craniocervical junction normal. Bone marrow signal intensity within normal limits. No scalp soft tissue abnormality. Sinuses/Orbits: Patient status post ocular lens replacement on the left. Globes and orbital soft tissues demonstrate no acute finding. Scattered mucosal thickening noted throughout the paranasal sinuses, most notable within the right sphenoid sinus. Trace left mastoid effusion noted, of doubtful significance. Inner ear structures grossly normal. Other: None. IMPRESSION: 1. Acute ischemic left MCA territory infarcts primarily involving the left caudate and lentiform nuclei. No associated hemorrhage or mass effect. 2. No other acute intracranial abnormality. 3. Age-related cerebral atrophy with mild chronic small vessel ischemic disease. Electronically Signed   By: BJeannine BogaM.D.   On: 03/04/2019 23:01   EP PPM/ICD IMPLANT  Result Date: 03/07/2019 SURGEON:  Will Camnitz, MD   PREPROCEDURE DIAGNOSIS:  Cryptogenic Stroke   POSTPROCEDURE DIAGNOSIS:  Cryptogenic Stroke    PROCEDURES:  1. Implantable loop recorder implantation   INTRODUCTION:  Johnchristopher Sarvis is a 86 y.o. male with a history of unexplained stroke who presents today for implantable loop implantation.  The patient has had a cryptogenic stroke.  Despite an extensive workup by neurology, no reversible causes have been  identified.  he has worn telemetry during which he did not have arrhythmias.  There is significant concern for possible atrial fibrillation as the cause for the patients stroke.  The patient therefore presents today for implantable loop implantation.   DESCRIPTION OF PROCEDURE:  Informed written consent was obtained, and the patient was brought to the electrophysiology lab in a fasting state.  The patient required no sedation for the procedure today.  Mapping over the patient's chest was performed by the EP lab staff to identify the area where electrograms were most prominent for ILR recording.  This area was found to be the left parasternal region over the 3rd-4th intercostal space. The patients left chest was therefore prepped and draped in the usual sterile fashion by the EP lab staff. The skin overlying the left parasternal region was infiltrated with lidocaine for local analgesia.  A 0.5-cm incision was made over the left parasternal region over the 3rd intercostal space.  A subcutaneous ILR pocket was fashioned using a combination of sharp and blunt dissection.  A Medtronic Reveal Anna model [id number] implantable loop recorder was then placed into the pocket  R waves were very prominent and measured 0.86m. EBL<1 ml.  Steri- Strips and a sterile dressing were then applied.  There were no early apparent complications.   CONCLUSIONS:  1. Successful implantation of a Medtronic Reveal LINQ implantable loop recorder for cryptogenic stroke  2. No early apparent complications.   IPlover Result Date: 03/04/2019 INDICATION: 86year old male with a history of acute right-sided symptoms and identification left MCA emergent large vessel occlusion EXAM: ULTRASOUND GUIDED ACCESS RIGHT COMMON FEMORAL ARTERY CERVICAL AND CEREBRAL ANGIOGRAM MECHANICAL THROMBECTOMY OF LEFT MCA DEPLOYMENT OF ANGIO-SEAL FOR HEMOSTASIS COMPARISON:  CT IMAGING SAME DAY MEDICATIONS: 1 g vancomycin. The antibiotic was  administered within 1 hour of the procedure ANESTHESIA/SEDATION: The anesthesia team was present to provide general endotracheal tube anesthesia and for patient monitoring during the procedure. Interventional neuro radiology nursing staff was also present. The patient was also continuously cared for during the procedure by the interventional radiology nurse under my direct supervision. CONTRAST:  50 cc IV contrast FLUOROSCOPY TIME:  Fluoroscopy Time: 14 minutes 24 seconds (1,007 mGy). COMPLICATIONS: None immediate. TECHNIQUE: Informed written consent was obtained from the patient after a thorough discussion of the procedural risks, benefits and alternatives. All questions were addressed. Maximal Sterile Barrier Technique was utilized including caps, mask, sterile gowns, sterile gloves, sterile drape, hand hygiene and skin antiseptic. A timeout was performed prior to the initiation of the procedure. Informed written consent was obtained from the patient's family after a thorough discussion of the procedural risks, benefits and alternatives. Specific risks discussed include: Bleeding, infection, contrast reaction, kidney injury/failure, need for further procedure/surgery, arterial injury or dissection, embolization to new territory, intracranial hemorrhage (10-15% risk), neurologic deterioration, cardiopulmonary collapse, death. All questions were addressed. Maximal Sterile Barrier Technique was utilized including during the procedure including caps, mask, sterile gowns, sterile gloves, sterile drape, hand hygiene and skin antiseptic. A timeout was performed prior to the initiation of  the procedure. The anesthesia team was present to provide general endotracheal tube anesthesia and for patient monitoring during the procedure. Interventional neuro radiology nursing staff was also present. FINDINGS: Initial Findings: Left common carotid artery:  Normal course caliber and contour. Left external carotid artery: Patent  with antegrade flow. Left internal carotid artery: Normal course caliber and contour of the cervical portion. Vertical and petrous segment patent with normal course caliber contour. Cavernous segment patent. Clinoid segment patent. Antegrade flow of the ophthalmic artery. Ophthalmic segment patent. Terminus patent. Left MCA:  Proximal M1 segment patent Early temporal branch, supplying the left temporal lobe. Early angiographic image demonstrates significant leptomeningeal collateral flow into the MCA territory from the [facility] territory and the patent temporal branches. Distal M1 occlusion. Left ACA: A 1 segment patent. A 2 segment perfuses the right territory. Patent anterior communicating artery. Leptomeningeal collaterals from the [place] territory through the watershed ANCA territory. Completion Findings: Left MCA: Restoration of fluoro through the MCA after mechanical thrombectomy. TICI 3 flow restored Flat panel CT demonstrates no subarachnoid hemorrhage. PROCEDURE: Patient is brought emergently to the neuro angiography suite, with the patient identified appropriately and placed supine position on the table. Left radial arterial line was placed by the anesthesia team. The patient is then prepped and draped in the usual sterile fashion. Ultrasound survey of the right inguinal region was performed with images stored and sent to PACs. 11 blade scalpel was used to make a small incision. Blunt dissection was performed. A micropuncture needle was used access the right common femoral artery under ultrasound. With excellent arterial blood flow returned, an .018 micro wire was passed through the needle, observed to enter the abdominal aorta under fluoroscopy. The needle was removed, and a micropuncture sheath was placed over the wire. The inner dilator and wire were removed, and an 035 Bentson wire was advanced under fluoroscopy into the abdominal aorta. The sheath was removed and a standard 5 [place] vascular sheath was  placed. The dilator was removed and the sheath was flushed. A 103F JB-1 diagnostic catheter was advanced over the wire to the proximal descending thoracic aorta. Wire was then removed. Double flush of the catheter was performed. Catheter was then used to select the left common carotid artery. Formal angiogram was performed demonstrating somewhat tortuous common carotid artery. Standard Glidewire and the JB 1 catheter were used to navigate to the bifurcation. Wire was removed and angiogram was performed. The roadrunner wire was then used to navigate the JB 1 catheter to the petrous segment. Wire was removed. Exchange length Rosen wire was then passed through the diagnostic catheter to the petrous ICA and the diagnostic catheter was removed. The 5 French sheath was removed and exchanged for 8 French 55 centimeter BrightTip sheath. Sheath was flushed and attached to pressurized and heparinized saline bag for constant forward flow. At this point we attempted to pass the 95 cm Walrus balloon guide catheter through the bright tip sheath. Multiple attempts using the introducer and without the introducer failed, and we ultimately decided that there was a mismatch, perhaps from quality control of the sheath. We then elected to use a flow gait balloon catheter. Eight French 95 cm flow gate balloon catheter was then advanced over the wire to the distal cervical segment. Wire was removed. Then a coaxial intermediate catheter and microcatheter combination was prepared on the back table. This combination was CAT-5 catheter and a Trevo TRK microcatheter, with a synchro soft wire. This combination was then advanced through the balloon  guide into the ICA. System was advanced into the internal carotid artery, to the level of the occlusion. The micro wire was then carefully advanced through the occluded segment. Microcatheter was then pushed through the occluded segment and the wire was removed. Intermediate catheter was advanced to the  distal balloon guide. Blood was then aspirated through the hub of the microcatheter, and a gentle contrast injection was performed confirming intraluminal position. A rotating hemostatic valve was then attached to the back end of the microcatheter, and a pressurized and heparinized saline bag was attached to the catheter. 69m x 346mTrevo NXT device was then selected. Back flush was achieved at the rotating hemostatic valve, and then the device was gently advanced through the microcatheter to the distal end. The retriever was then unsheathed by withdrawing the microcatheter under fluoroscopy. Once the retriever was completely unsheathed, the microcatheter was carefully stripped from the delivery wire of the device. 3 minute time interval was observed. The balloon on the balloon guide was then inflated to profile of the vessel. Constant suction aspiration was then performed through the intermediate catheter, and constant gentle aspiration was performed at the balloon guide. This aspiration was continued as the retriever was gently and slowly withdrawn with fluoroscopic observation into the distal intermediate catheter. The entire system was then gently withdrawn from the intracranial ICA and into the balloon guide. Once the retriever was entirely removed from the system, free aspiration was confirmed at the hub of the balloon guide, with free blood return confirmed. Control angiogram was then performed, confirming restoration of flow. Balloon guide was withdrawn with angiogram performed of the cervical ICA. Balloon guide was removed, and the bright tip sheath was removed and a wire with Angio-Seal deployed. Pulses were confirmed bilateral lower extremity. Patient tolerated the procedure well and remained hemodynamically stable throughout. No complications were encountered. Estimated blood loss approximately 75 cc. IMPRESSION: Status post ultrasound guided access right common femoral artery for left-sided cervical and  cerebral angiogram and mechanical thrombectomy of left M1 occlusion via single pass of Trevo 3x32 NXT device. Angio-Seal deployed for hemostasis. Signed, JaDulcy FannyWaDellia NimsRPVI [facility] PL[facility]: Admit to ICU Patient was extubated in the room Blood pressure target of 16510ystolic Right hip straight times 8 hours, status post Angio-Seal deployment. Electronically Signed   By: JaCorrie Mckusick.O.   On: 03/04/2019 01:25   IR USKoreauide Vasc Access Right  Result Date: 03/04/2019 INDICATION: [age]ear old male with a history of acute right-sided symptoms and identification left MCA emergent large vessel occlusion EXAM: ULTRASOUND GUIDED ACCESS RIGHT COMMON FEMORAL ARTERY CERVICAL AND CEREBRAL ANGIOGRAM MECHANICAL THROMBECTOMY OF LEFT MCA DEPLOYMENT OF ANGIO-SEAL FOR HEMOSTASIS COMPARISON:  CT IMAGING SAME DAY MEDICATIONS: 1 g vancomycin. The antibiotic was administered within 1 hour of the procedure ANESTHESIA/SEDATION: The anesthesia team was present to provide general endotracheal tube anesthesia and for patient monitoring during the procedure. Interventional neuro radiology nursing staff was also present. The patient was also continuously cared for during the procedure by the interventional radiology nurse under my direct supervision. CONTRAST:  50 cc IV contrast FLUOROSCOPY TIME:  Fluoroscopy Time: 14 minutes 24 seconds (1,007 mGy). COMPLICATIONS: None immediate. TECHNIQUE: Informed written consent was obtained from the patient after a thorough discussion of the procedural risks, benefits and alternatives. All questions were addressed. Maximal Sterile Barrier Technique was utilized including caps, mask, sterile gowns, sterile gloves, sterile drape, hand hygiene and skin antiseptic. A timeout was performed prior to  the initiation of the procedure. Informed written consent was obtained from the patient's family after a thorough discussion of the procedural  risks, benefits and alternatives. Specific risks discussed include: Bleeding, infection, contrast reaction, kidney injury/failure, need for further procedure/surgery, arterial injury or dissection, embolization to new territory, intracranial hemorrhage (10-15% risk), neurologic deterioration, cardiopulmonary collapse, death. All questions were addressed. Maximal Sterile Barrier Technique was utilized including during the procedure including caps, mask, sterile gowns, sterile gloves, sterile drape, hand hygiene and skin antiseptic. A timeout was performed prior to the initiation of the procedure. The anesthesia team was present to provide general endotracheal tube anesthesia and for patient monitoring during the procedure. Interventional neuro radiology nursing staff was also present. FINDINGS: Initial Findings: Left common carotid artery:  Normal course caliber and contour. Left external carotid artery: Patent with antegrade flow. Left internal carotid artery: Normal course caliber and contour of the cervical portion. Vertical and petrous segment patent with normal course caliber contour. Cavernous segment patent. Clinoid segment patent. Antegrade flow of the ophthalmic artery. Ophthalmic segment patent. Terminus patent. Left MCA:  Proximal M1 segment patent Early temporal branch, supplying the left temporal lobe. Early angiographic image demonstrates significant leptomeningeal collateral flow into the MCA territory from the [facility] territory and the patent temporal branches. Distal M1 occlusion. Left ACA: A 1 segment patent. A 2 segment perfuses the right territory. Patent anterior communicating artery. Leptomeningeal collaterals from the [place] territory through the watershed ANCA territory. Completion Findings: Left MCA: Restoration of fluoro through the MCA after mechanical thrombectomy. TICI 3 flow restored Flat panel CT demonstrates no subarachnoid hemorrhage. PROCEDURE: Patient is brought emergently to the neuro  angiography suite, with the patient identified appropriately and placed supine position on the table. Left radial arterial line was placed by the anesthesia team. The patient is then prepped and draped in the usual sterile fashion. Ultrasound survey of the right inguinal region was performed with images stored and sent to PACs. 11 blade scalpel was used to make a small incision. Blunt dissection was performed. A micropuncture needle was used access the right common femoral artery under ultrasound. With excellent arterial blood flow returned, an .018 micro wire was passed through the needle, observed to enter the abdominal aorta under fluoroscopy. The needle was removed, and a micropuncture sheath was placed over the wire. The inner dilator and wire were removed, and an 035 Bentson wire was advanced under fluoroscopy into the abdominal aorta. The sheath was removed and a standard 5 [place] vascular sheath was placed. The dilator was removed and the sheath was flushed. A 57F JB-1 diagnostic catheter was advanced over the wire to the proximal descending thoracic aorta. Wire was then removed. Double flush of the catheter was performed. Catheter was then used to select the left common carotid artery. Formal angiogram was performed demonstrating somewhat tortuous common carotid artery. Standard Glidewire and the JB 1 catheter were used to navigate to the bifurcation. Wire was removed and angiogram was performed. The roadrunner wire was then used to navigate the JB 1 catheter to the petrous segment. Wire was removed. Exchange length Rosen wire was then passed through the diagnostic catheter to the petrous ICA and the diagnostic catheter was removed. The 5 French sheath was removed and exchanged for 8 French 55 centimeter BrightTip sheath. Sheath was flushed and attached to pressurized and heparinized saline bag for constant forward flow. At this point we attempted to pass the 95 cm Walrus balloon guide catheter through the  bright tip sheath. Multiple  attempts using the introducer and without the introducer failed, and we ultimately decided that there was a mismatch, perhaps from quality control of the sheath. We then elected to use a flow gait balloon catheter. Eight French 95 cm flow gate balloon catheter was then advanced over the wire to the distal cervical segment. Wire was removed. Then a coaxial intermediate catheter and microcatheter combination was prepared on the back table. This combination was CAT-5 catheter and a Trevo TRK microcatheter, with a synchro soft wire. This combination was then advanced through the balloon guide into the ICA. System was advanced into the internal carotid artery, to the level of the occlusion. The micro wire was then carefully advanced through the occluded segment. Microcatheter was then pushed through the occluded segment and the wire was removed. Intermediate catheter was advanced to the distal balloon guide. Blood was then aspirated through the hub of the microcatheter, and a gentle contrast injection was performed confirming intraluminal position. A rotating hemostatic valve was then attached to the back end of the microcatheter, and a pressurized and heparinized saline bag was attached to the catheter. 47m x 327mTrevo NXT device was then selected. Back flush was achieved at the rotating hemostatic valve, and then the device was gently advanced through the microcatheter to the distal end. The retriever was then unsheathed by withdrawing the microcatheter under fluoroscopy. Once the retriever was completely unsheathed, the microcatheter was carefully stripped from the delivery wire of the device. 3 minute time interval was observed. The balloon on the balloon guide was then inflated to profile of the vessel. Constant suction aspiration was then performed through the intermediate catheter, and constant gentle aspiration was performed at the balloon guide. This aspiration was continued as the  retriever was gently and slowly withdrawn with fluoroscopic observation into the distal intermediate catheter. The entire system was then gently withdrawn from the intracranial ICA and into the balloon guide. Once the retriever was entirely removed from the system, free aspiration was confirmed at the hub of the balloon guide, with free blood return confirmed. Control angiogram was then performed, confirming restoration of flow. Balloon guide was withdrawn with angiogram performed of the cervical ICA. Balloon guide was removed, and the bright tip sheath was removed and a wire with Angio-Seal deployed. Pulses were confirmed bilateral lower extremity. Patient tolerated the procedure well and remained hemodynamically stable throughout. No complications were encountered. Estimated blood loss approximately 75 cc. IMPRESSION: Status post ultrasound guided access right common femoral artery for left-sided cervical and cerebral angiogram and mechanical thrombectomy of left M1 occlusion via single pass of Trevo 3x32 NXT device. Angio-Seal deployed for hemostasis. Signed, JaDulcy FannyWaDellia NimsRPVI [facility]: Admit to ICU Patient was extubated in the room Blood pressure target of 16794ystolic Right hip straight times 8 hours, status post Angio-Seal deployment. Electronically Signed   By: JaCorrie Mckusick.O.   On: 03/04/2019 01:25   IR USKoreauide Bx Asp/Drain  Result Date: 03/07/2019 INDICATION: [age]ear old male status post large vessel stroke thrombectomy now complicated by small pseudoaneurysm formation at the right common femoral arterial access. He is symptomatic and continues to bleed at the puncture site. EXAM: Ultrasound-guided thrombin injection of pseudoaneurysm MEDICATIONS: None ANESTHESIA/SEDATION: None CONTRAST:  None FLUOROSCOPY TIME:  None COMPLICATIONS: None immediate. PROCEDURE: Informed consent was obtained from the patient following explanation  of the procedure, risks, benefits and alternatives. The patient understands, agrees and consents for the procedure. All questions were addressed.  A time out was performed prior to the initiation of the procedure. Ultrasound was used to interrogate the right groin. There is a small pulsatile pseudoaneurysm measuring approximately 1.4 x 0.8 cm. A suitable skin entry site was selected and marked. The region was sterilely prepped and draped in the standard fashion using chlorhexidine skin prep. A 21 gauge micropuncture needle was then carefully advanced under real-time ultrasound guidance and used to puncture the pseudoaneurysm. There was return of pulsatile blood. Bovine thrombin was reconstituted with a concentration of 5000 mcg in 5 mL sterile saline. 1 mL was drawn up in a medallion syringe and carefully injected in small aliquots in till there was evidence of thrombosis of the pseudoaneurysm on ultrasound. Approximately 200 mcg was injected. The needle was removed.  Sterile bandage was placed. The distal pulses at the foot were evaluated before and after the injection procedure. There was no change in the easily palpable dorsalis pedis pulse. IMPRESSION: Successful thrombin injection with thrombosis of the right common femoral artery pseudoaneurysm. Electronically Signed   By: Jacqulynn Cadet M.D.   On: 03/07/2019 16:15   CT Code Stroke Cerebral Perfusion with contrast  Result Date: 03/03/2019 CLINICAL DATA:  Initial evaluation for acute stroke. EXAM: CT ANGIOGRAPHY HEAD AND NECK CT PERFUSION BRAIN TECHNIQUE: Multidetector CT imaging of the head and neck was performed using the standard protocol during bolus administration of intravenous contrast. Multiplanar CT image reconstructions and MIPs were obtained to evaluate the vascular anatomy. Carotid stenosis measurements (when applicable) are obtained utilizing NASCET criteria, using the distal internal carotid diameter as the denominator. Multiphase CT imaging  of the brain was performed following IV bolus contrast injection. Subsequent parametric perfusion maps were calculated using RAPID software. CONTRAST:  11m OMNIPAQUE IOHEXOL 350 MG/ML SOLN COMPARISON:  Prior noncontrast head CT from earlier same day. FINDINGS: CTA NECK FINDINGS Aortic arch: Visualized aortic arch of normal caliber with normal branch pattern. Moderate atherosclerotic change about the arch and origin of the great vessels without hemodynamically significant stenosis. Visualized subclavian arteries patent. Right carotid system: Right common and internal carotid arteries are mildly tortuous but widely patent without significant stenosis, dissection or occlusion. Left carotid system: Left common and internal carotid arteries are mildly tortuous but widely patent without significant stenosis, dissection or occlusion. Vertebral arteries: Both vertebral arteries arise from the subclavian arteries. Focal plaque at the origin of the right vertebral artery with associated 50% stenosis. Focal non stenotic plaque noted at the origin of the left vertebral artery as well. Vertebral arteries tortuous but otherwise patent within the neck without stenosis, dissection, or occlusion. Skeleton: No acute osseous abnormality. No worrisome lytic or blastic osseous lesions. Moderate multilevel cervical spondylosis without significant stenosis. Other neck: No other acute soft tissue abnormality within the neck. No mass lesion or adenopathy. Upper chest: Mildly enlarged and partially calcified 12 mm precarinal lymph node noted. Patchy multifocal ground-glass opacities seen within the partially visualized lungs, similar to previous. Superimposed scattered subpleural reticular densities and fibrotic changes. Superimposed 9 mm nodular density at the posterior left upper lobe (series 5, image 169). Additional 9 mm pleural base nodular density partially visualized at the anterior right upper lobe (series 5, image 189). Review of  the MIP images confirms the above findings CTA HEAD FINDINGS Anterior circulation: Scattered calcified plaque throughout the petrous, cavernous, and supraclinoid ICAs with associated moderate to severe multifocal narrowing. A1 segments patent. Normal anterior communicating artery. Anterior cerebral arteries widely patent to their distal aspects without stenosis. Left M1 patent.  There is occlusive thrombus within the proximal left M2 branch, superior division. Attenuated collateral flow seen distally. Anterior temporal/inferior division remains patent. Right M1 widely patent. Normal right MCA bifurcation. Short-segment severe stenosis at the origin of the right M2 inferior division. Right MCA branches well perfused distally. Posterior circulation: Calcified atheromatous plaque within the dominant left V4 segment with associated moderate multifocal stenosis. Diminutive right vertebral artery widely patent the vertebrobasilar junction. Posterior inferior cerebral arteries patent bilaterally. Basilar widely patent to its distal aspect. Superior cerebral arteries patent bilaterally. Both PCAs primarily supplied via the basilar. Short-segment severe distal left P2 stenosis noted. PCAs otherwise patent to their distal aspects without stenosis. Venous sinuses: Patent. Anatomic variants: None significant. Review of the MIP images confirms the above findings CT Brain Perfusion Findings: ASPECTS: 9 CBF (<30%) Volume: 72m Perfusion (Tmax>6.0s) volume: 520mMismatch Volume: 4445mnfarction Location:Patchy small volume acute core infarct seen involving the deep white matter of the left cerebral hemisphere, somewhat watershed in distribution. Surrounding ischemic penumbra of 55 cc, with mismatch volume of 44 cc. IMPRESSION: CTA HEAD AND NECK IMPRESSION: 1. Positive CTA for LVO with occlusive thrombus within a proximal left M2 branch, superior division. 2. Heavy calcified plaque throughout the carotid siphons with associated  moderate to severe multifocal narrowing. 3. Short-segment severe proximal right M2 and distal left P2 stenoses. 4. Approximate 50% atheromatous stenoses at the origin of the right vertebral artery as well as involving the left V4 segment. CT PERFUSION IMPRESSION: 1. 11 cc acute core infarct involving the deep white matter of the left cerebral hemisphere, watershed in distribution. Surrounding 55 cc ischemic penumbra involving much of the left MCA. 2. Aspects equals 9 on prior head CT. These results were communicated to Dr. LinCheral Marker 11:22 pmon 1/2/2021by text page via the [facility] system. Electronically Signed   By: BenJeannine BogaD.   On: 03/03/2019 23:44   VAS US [place] PSEUDOANEURYSM  Result Date: 03/08/2019  ARTERIAL PSEUDOANEURYSM  Exam: Right groin Indications: Patient complains of Pseudoaneurysm detected 03/07/2019. History: Pseudoaneurysm injection 03/07/2019. Performing Technologist: GreOliver HumT  Examination Guidelines: A complete evaluation includes B-mode imaging, spectral Doppler, color Doppler, and power Doppler as needed of all accessible portions of each vessel. Bilateral testing is considered an integral part of a complete examination. Limited examinations for reoccurring indications may be performed as noted. +------------+----------+---------+------+----------+ Right DuplexPSV (cm/s)Waveform PlaqueComment(s) +------------+----------+---------+------+----------+ CFA                   triphasic                 +------------+----------+---------+------+----------+ Prox SFA              triphasic                 +------------+----------+---------+------+----------+ Right Vein comments:Posterior tibial, peroneal, and anterior tibial artery waveforms are noted to be triphasic.  Findings: An area with well defined borders measuring 1.2 cm x 0.7 cm was visualized arising off of the common femoral artery with ultrasound characteristics of a pseudoaneurysm. Mixed echos  within the structure suggest that it is partially thrombosed with a residual diameter of 0.4 cm x 0.2 cm. The neck measures approximately 0.2 cm wide and 0.2 cm long.    --------------------------------------------------------------------------------    Preliminary    VAS US [place] PSEUDOANEURYSM  Result Date: 03/07/2019  ARTERIAL PSEUDOANEURYSM  Exam: Right groin Indications: Patient complains of palpable knot and bleeding. History: S/p catheterization. Comparison Study: No prior study. Performing Technologist:  Michelle Simonetti MHA, RDMS, RVT, RDCS  Examination Guidelines: A complete evaluation includes B-mode imaging, spectral Doppler, color Doppler, and power Doppler as needed of all accessible portions of each vessel. Bilateral testing is considered an integral part of a complete examination. Limited examinations for reoccurring indications may be performed as noted. +------------+----------+--------+------+----------+ Right DuplexPSV (cm/s)WaveformPlaqueComment(s) +------------+----------+--------+------+----------+ CFA            152    biphasic                 +------------+----------+--------+------+----------+ Right Vein comments:patent right CFV  Findings: An area with well defined borders measuring 0.9 cm x 0.5 cm was visualized arising off of the right CFA with ultrasound characteristics of a pseudoaneurysm. Mixed echos within the structure suggest that it is partially thrombosed with a residual diameter  of 0.8 cm x 0.5 cm. The neck measures approximately 0.3 cm wide and 0.5 cm long. Distal right PTA is patent with biphasic flow.  Diagnosing physician: Curt Jews MD Electronically signed by Curt Jews MD on 03/07/2019 at 4:41:44 PM.   --------------------------------------------------------------------------------    Final    ECHOCARDIOGRAM COMPLETE  Result Date: 03/05/2019   ECHOCARDIOGRAM REPORT   Patient Name:   ISOM KOCHAN Date of Exam: 03/05/2019 Medical Rec #:  [id number]      Height:       68.0 in Accession #:    [id number]    Weight:       138.0 lb Date of Birth:  03/13/1925     BSA:          1.75 m Patient Age:    21 years      BP:           161/65 mmHg Patient Gender: M             HR:           89 bpm. Exam Location:  Inpatient Procedure: 2D Echo Indications:    434.91 stroke  History:        Patient has prior history of Echocardiogram examinations, most                 recent 11/21/2012. Risk Factors:Hypertension and Dyslipidemia.  Sonographer:    Jannett Celestine RDCS (AE) Referring Phys: [phone] ERIC LINDZEN  Sonographer Comments: Suboptimal subcostal window. IMPRESSIONS  1. Left ventricular ejection fraction, by visual estimation, is 65 to 70%. The left ventricle has normal function. There is moderately increased left ventricular hypertrophy.  2. Left ventricular diastolic parameters are consistent with Grade I diastolic dysfunction (impaired relaxation).  3. The left ventricle has no regional wall motion abnormalities.  4. Global right ventricle has normal systolic function.The right ventricular size is normal. No increase in right ventricular wall thickness.  5. Left atrial size was normal.  6. Right atrial size was normal.  7. The mitral valve is normal in structure. Trivial mitral valve regurgitation. No evidence of mitral stenosis.  8. The tricuspid valve is normal in structure.  9. The aortic valve is tricuspid. Aortic valve regurgitation is mild. Mild aortic valve sclerosis without stenosis. 10. The pulmonic valve was normal in structure. Pulmonic valve regurgitation is not visualized. 11. Aortic dilatation noted. 12. There is mild dilatation of the ascending aorta measuring 40 mm. 13. Severely elevated pulmonary artery systolic pressure. 14. The atrial septum is grossly normal. FINDINGS  Left Ventricle: Left ventricular ejection fraction, by visual estimation, is 65 to 70%. The left ventricle has normal function. The left ventricle has no  regional wall motion abnormalities.  There is moderately increased left ventricular hypertrophy. Concentric left ventricular hypertrophy. Left ventricular diastolic parameters are consistent with Grade I diastolic dysfunction (impaired relaxation). Right Ventricle: The right ventricular size is normal. No increase in right ventricular wall thickness. Global RV systolic function is has normal systolic function. Left Atrium: Left atrial size was normal in size. Right Atrium: Right atrial size was normal in size Pericardium: There is no evidence of pericardial effusion. Mitral Valve: The mitral valve is normal in structure. Trivial mitral valve regurgitation. No evidence of mitral valve stenosis by observation. Tricuspid Valve: The tricuspid valve is normal in structure. Tricuspid valve regurgitation is mild. Aortic Valve: The aortic valve is tricuspid. Aortic valve regurgitation is mild. Mild aortic valve sclerosis is present, with no evidence of aortic valve stenosis. Mild aortic valve annular calcification. Pulmonic Valve: The pulmonic valve was normal in structure. Pulmonic valve regurgitation is not visualized. Pulmonic regurgitation is not visualized. Aorta: Aortic dilatation noted. There is mild dilatation of the ascending aorta measuring 40 mm. IAS/Shunts: The atrial septum is grossly normal.  LEFT VENTRICLE PLAX 2D LVIDd:         3.11 cm  Diastology LVIDs:         1.88 cm  LV e' lateral:   8.70 cm/s LV PW:         1.32 cm  LV E/e' lateral: 6.9 LV IVS:        1.40 cm  LV e' medial:    8.16 cm/s LVOT diam:     2.20 cm  LV E/e' medial:  7.4 LV SV:         27 ml LV SV Index:   15.81 LVOT Area:     3.80 cm  LEFT ATRIUM             Index       RIGHT ATRIUM           Index LA diam:        2.60 cm 1.49 cm/m  RA Area:     13.80 cm LA Vol (A2C):   50.7 ml 29.04 ml/m RA Volume:   28.00 ml  16.04 ml/m LA Vol (A4C):   35.7 ml 20.45 ml/m LA Biplane Vol: 45.1 ml 25.84 ml/m  AORTIC VALVE LVOT Vmax:   95.10 cm/s LVOT Vmean:  68.900 cm/s LVOT VTI:    0.210  m  AORTA Ao Root diam: 3.90 cm MITRAL VALVE                        TRICUSPID VALVE MV Area (PHT): 3.77 cm             TR Peak grad:   58.1 mmHg MV PHT:        58.29 msec           TR Vmax:        416.00 cm/s MV Decel Time: 201 msec MV E velocity: 60.00 cm/s 103 cm/s  SHUNTS MV A velocity: 84.50 cm/s 70.3 cm/s Systemic VTI:  0.21 m MV E/A ratio:  0.71       1.5       Systemic Diam: 2.20 cm  [name] MD Electronically signed by [name] MD Signature Date/Time: 03/05/2019/1:08:04 PM    Final    IR PERCUTANEOUS ART THROMBECTOMY/INFUSION INTRACRANIAL INC DIAG ANGIO  Result Date: 03/04/2019 INDICATION: 85 year old male with a history of acute right-sided symptoms and identification left MCA emergent large  vessel occlusion EXAM: ULTRASOUND GUIDED ACCESS RIGHT COMMON FEMORAL ARTERY CERVICAL AND CEREBRAL ANGIOGRAM MECHANICAL THROMBECTOMY OF LEFT MCA DEPLOYMENT OF ANGIO-SEAL FOR HEMOSTASIS COMPARISON:  CT IMAGING SAME DAY MEDICATIONS: 1 g vancomycin. The antibiotic was administered within 1 hour of the procedure ANESTHESIA/SEDATION: The anesthesia team was present to provide general endotracheal tube anesthesia and for patient monitoring during the procedure. Interventional neuro radiology nursing staff was also present. The patient was also continuously cared for during the procedure by the interventional radiology nurse under my direct supervision. CONTRAST:  50 cc IV contrast FLUOROSCOPY TIME:  Fluoroscopy Time: 14 minutes 24 seconds (1,007 mGy). COMPLICATIONS: None immediate. TECHNIQUE: Informed written consent was obtained from the patient after a thorough discussion of the procedural risks, benefits and alternatives. All questions were addressed. Maximal Sterile Barrier Technique was utilized including caps, mask, sterile gowns, sterile gloves, sterile drape, hand hygiene and skin antiseptic. A timeout was performed prior to the initiation of the procedure. Informed written consent was obtained from the  patient's family after a thorough discussion of the procedural risks, benefits and alternatives. Specific risks discussed include: Bleeding, infection, contrast reaction, kidney injury/failure, need for further procedure/surgery, arterial injury or dissection, embolization to new territory, intracranial hemorrhage (10-15% risk), neurologic deterioration, cardiopulmonary collapse, death. All questions were addressed. Maximal Sterile Barrier Technique was utilized including during the procedure including caps, mask, sterile gowns, sterile gloves, sterile drape, hand hygiene and skin antiseptic. A timeout was performed prior to the initiation of the procedure. The anesthesia team was present to provide general endotracheal tube anesthesia and for patient monitoring during the procedure. Interventional neuro radiology nursing staff was also present. FINDINGS: Initial Findings: Left common carotid artery:  Normal course caliber and contour. Left external carotid artery: Patent with antegrade flow. Left internal carotid artery: Normal course caliber and contour of the cervical portion. Vertical and petrous segment patent with normal course caliber contour. Cavernous segment patent. Clinoid segment patent. Antegrade flow of the ophthalmic artery. Ophthalmic segment patent. Terminus patent. Left MCA:  Proximal M1 segment patent Early temporal branch, supplying the left temporal lobe. Early angiographic image demonstrates significant leptomeningeal collateral flow into the MCA territory from the [facility] territory and the patent temporal branches. Distal M1 occlusion. Left ACA: A 1 segment patent. A 2 segment perfuses the right territory. Patent anterior communicating artery. Leptomeningeal collaterals from the Reader territory through the watershed ANCA territory. Completion Findings: Left MCA: Restoration of fluoro through the MCA after mechanical thrombectomy. TICI 3 flow restored Flat panel CT demonstrates no subarachnoid  hemorrhage. PROCEDURE: Patient is brought emergently to the neuro angiography suite, with the patient identified appropriately and placed supine position on the table. Left radial arterial line was placed by the anesthesia team. The patient is then prepped and draped in the usual sterile fashion. Ultrasound survey of the right inguinal region was performed with images stored and sent to PACs. 11 blade scalpel was used to make a small incision. Blunt dissection was performed. A micropuncture needle was used access the right common femoral artery under ultrasound. With excellent arterial blood flow returned, an .018 micro wire was passed through the needle, observed to enter the abdominal aorta under fluoroscopy. The needle was removed, and a micropuncture sheath was placed over the wire. The inner dilator and wire were removed, and an 035 Bentson wire was advanced under fluoroscopy into the abdominal aorta. The sheath was removed and a standard 5 [place] vascular sheath was placed. The dilator was removed and the sheath was flushed.  A 13F JB-1 diagnostic catheter was advanced over the wire to the proximal descending thoracic aorta. Wire was then removed. Double flush of the catheter was performed. Catheter was then used to select the left common carotid artery. Formal angiogram was performed demonstrating somewhat tortuous common carotid artery. Standard Glidewire and the JB 1 catheter were used to navigate to the bifurcation. Wire was removed and angiogram was performed. The roadrunner wire was then used to navigate the JB 1 catheter to the petrous segment. Wire was removed. Exchange length Rosen wire was then passed through the diagnostic catheter to the petrous ICA and the diagnostic catheter was removed. The 5 French sheath was removed and exchanged for 8 French 55 centimeter BrightTip sheath. Sheath was flushed and attached to pressurized and heparinized saline bag for constant forward flow. At this point we  attempted to pass the 95 cm Walrus balloon guide catheter through the bright tip sheath. Multiple attempts using the introducer and without the introducer failed, and we ultimately decided that there was a mismatch, perhaps from quality control of the sheath. We then elected to use a flow gait balloon catheter. Eight French 95 cm flow gate balloon catheter was then advanced over the wire to the distal cervical segment. Wire was removed. Then a coaxial intermediate catheter and microcatheter combination was prepared on the back table. This combination was CAT-5 catheter and a Trevo TRK microcatheter, with a synchro soft wire. This combination was then advanced through the balloon guide into the ICA. System was advanced into the internal carotid artery, to the level of the occlusion. The micro wire was then carefully advanced through the occluded segment. Microcatheter was then pushed through the occluded segment and the wire was removed. Intermediate catheter was advanced to the distal balloon guide. Blood was then aspirated through the hub of the microcatheter, and a gentle contrast injection was performed confirming intraluminal position. A rotating hemostatic valve was then attached to the back end of the microcatheter, and a pressurized and heparinized saline bag was attached to the catheter. 60m x 366mTrevo NXT device was then selected. Back flush was achieved at the rotating hemostatic valve, and then the device was gently advanced through the microcatheter to the distal end. The retriever was then unsheathed by withdrawing the microcatheter under fluoroscopy. Once the retriever was completely unsheathed, the microcatheter was carefully stripped from the delivery wire of the device. 3 minute time interval was observed. The balloon on the balloon guide was then inflated to profile of the vessel. Constant suction aspiration was then performed through the intermediate catheter, and constant gentle aspiration was  performed at the balloon guide. This aspiration was continued as the retriever was gently and slowly withdrawn with fluoroscopic observation into the distal intermediate catheter. The entire system was then gently withdrawn from the intracranial ICA and into the balloon guide. Once the retriever was entirely removed from the system, free aspiration was confirmed at the hub of the balloon guide, with free blood return confirmed. Control angiogram was then performed, confirming restoration of flow. Balloon guide was withdrawn with angiogram performed of the cervical ICA. Balloon guide was removed, and the bright tip sheath was removed and a wire with Angio-Seal deployed. Pulses were confirmed bilateral lower extremity. Patient tolerated the procedure well and remained hemodynamically stable throughout. No complications were encountered. Estimated blood loss approximately 75 cc. IMPRESSION: Status post ultrasound guided access right common femoral artery for left-sided cervical and cerebral angiogram and mechanical thrombectomy of left M1 occlusion  via single pass of Trevo 3x32 NXT device. Angio-Seal deployed for hemostasis. Signed, Dulcy Fanny. Dellia Nims, RPVI [facility] Radiology PLAN: Admit to ICU Patient was extubated in the room Blood pressure target of 956 systolic Right hip straight times 8 hours, status post Angio-Seal deployment. Electronically Signed   By: Corrie Mckusick D.O.   On: 03/04/2019 01:25   CT HEAD CODE STROKE WO CONTRAST`  Result Date: 03/03/2019 CLINICAL DATA:  Code stroke. Initial evaluation for acute stroke, facial droop. EXAM: CT HEAD WITHOUT CONTRAST TECHNIQUE: Contiguous axial images were obtained from the base of the skull through the vertex without intravenous contrast. COMPARISON:  None available. FINDINGS: Brain: Generalized age-related cerebral atrophy with moderate chronic microvascular ischemic disease. No acute intracranial hemorrhage.  Subtle hypodensity seen involving the left caudate, concerning for possible evolving left MCA territory infarct. Gray-white matter differentiation otherwise grossly maintained without definite discernible evidence for evolving ischemia. No mass lesion or midline shift. No hydrocephalus. No extra-axial fluid collection. Vascular: Apparent hyperdensity noted involving the distal left M1 and/or proximal M2 branch (a series 6, image 45), concerning for possible LVO. Calcified atherosclerosis present at the skull base. Skull: Scalp soft tissues and calvarium within normal limits. Sinuses/Orbits: Globes and orbital soft tissues within normal limits. Sequelae of prior sinus surgery noted. Chronic mucosal thickening noted within the maxillary sinuses, ethmoidal air cells, and right sphenoid sinus. Mastoid air cells are clear. Other: None. ASPECTS [facility] Stroke Program Early CT Score) - Ganglionic level infarction (caudate, lentiform nuclei, internal capsule, insula, M1-M3 cortex): 6 - Supraganglionic infarction (M4-M6 cortex): 3 Total score (0-10 with 10 being normal): 9 IMPRESSION: 1. Asymmetric hyperdensity involving the distal left M1 and/or proximal M2 branch, concerning for LVO. Subtle hypodensity involving the left caudate concerning for evolving left MCA territory ischemia. No intracranial hemorrhage. 2. ASPECTS is 9. 3. Underlying age-related cerebral atrophy with moderate chronic small vessel ischemic disease. These results were communicated to Dr. Cheral Marker At 10:58 pmon 1/2/2021by text page via the [facility] messaging system. Electronically Signed   By: Jeannine Boga M.D.   On: 03/03/2019 23:02      HISTORY OF PRESENT ILLNESS Oziah Vitanza is an 86 y.o. male who was recently discharged from [facility] on Saturday afternoon after having been admitted there for treatment of acute on chronic respiratory failure with hypoxia. He presented to the ED at [facility] on  Saturday night after being noted to be suddenly aphasic with right sided paralysis at home. 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    Per Rapid Response RN note: "Pt arrived in ED at 2223. At 2030 pt became aphasic with R sided paralysis. During transport to ED, pt's symptoms resolved completely. On arrival, at 2223, pt's symptoms were still resolved. At 2227, pt developed R facial droop and R sided weakness, Code stroke initiated at 2236. Pt transported to CT. While in CT, pt developed aphasia and dysarthria in addition to R facial droop and R leg drift. NIH-4, CBG-144".   CTA showed a L M2 superior branch occlusion with penumbra. He was LKW at 2227 on 03/03/2019. He was administered IV tPA and sent to IR.     HOSPITAL COURSE Mr. Jung Yurchak is a 86 y.o. 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 presenting to [facility] later that day with intermittent aphasia and right sided weakness. Received IV tPA Saturday 03/03/19 at 2315 followed by mechanical thrombectomy of left M1 occlusion. Did really well post IR except for mild groin bleeding. Found to have a small pseudoaneurysm which was injected with thrombin. Loop placed to look for atrial fibrillation as possible source of stroke. D/c home w/ wife and HH therapies.    Stroke:  Left MCA infarcts s/p tpA and successful IR  - embolic - source unknown Code Stroke CT Head - Asymmetric hyperdensity involving the distal left M1 and/or proximal M2 branch, concerning for LVO. Subtle hypodensity involving the left caudate concerning for evolving left MCA territory ischemia. No intracranial hemorrhage. ASPECTS is 9. Underlying age-related  cerebral atrophy with moderate chronic small vessel ischemic disease.  CTA H&N - Positive CTA for LVO with occlusive thrombus within a proximal left M2 branch, superior division. Heavy calcified plaque throughout the carotid siphons with associated moderate to severe multifocal narrowing. Short-segment severe proximal right M2 and distal left P2 stenoses. Approximate 50% atheromatous stenoses at the origin of the right vertebral artery as well as involving the left V4 segmen CT Perfusion - 11 cc acute core infarct involving the deep white matter of the left cerebral hemisphere, watershed in distribution. Surrounding 55 cc ischemic penumbra involving much of the left MCA.  Cerebral angio L M1 occlusion w/ revascularization  MRI head -  L MCA infarcts (caudate head and lentiform nucleus). Small vessel disease. Atrophy.  2D Echo - EF 65-70%. No source of embolus No TEE d/t advanced age [place] electrophysiologist consulted and placed an implantable loop recorder to evaluate for atrial fibrillation as etiology of stroke 03/07/19 ([place]).  [employer] Virus 2 - negative LDL - 87 HgbA1c - 4.8 aspirin 81 mg daily prior to admission, now on aspirin 81 mg daily and clopidogrel 75 mg daily 3 weeks then plavix alone  Therapy recommendations:  HH PT, HH OT, no SLP Disposition:  return home w/ wife   R Groin Pseudoaneurysm, small Bleeding noted after ambulation Pressure held with resolution US showed small pseudoaneurysm Injected w/ thrombic 1/6 Repeat US after injection shows almost total thrombosis w/ little residual flow No strenuous activites  f/u Dr. Earleen Newport in 2 weeks and repeat US at that time  Hypertension Home BP meds: Zestril, hydralazine, Amlodipine (listed allergy to norvasc not taking), lasix Current BP meds:  lisinopril 40, hydralazine 50 q 8h Long-term BP goal normotensive   Hyperlipidemia Home Lipid lowering medication: none  LDL 104, goal < 70 Try Lipitor  10 mg daily - will not use intensive statin given advanced age and listed dx of statin intolerance Continue statin at discharge and follow up   Other Stroke Risk Factors Advanced age Chronic diastolic CHF on lasix.    Other Active Problems Leukocytosis - 16.6->14.8->12->13 (afebrile)  Hyponatremia - 131->135->130 Essential Thrombocytosis - 677->591->481->491 on hydroxyurea PTA   Mild Bradycardia, resolved Mild acute blood loss anemia s/p IR 13.8->15->12.8->11.4->11.8 pulm fibrosis pulm HTN Pressure injury L Buttocks Stage 2, present on admission  Malnutrition with increased nutrient needs for wound healing. Ensure Enlive Recent respiratory illness d/c on abx, to continue at d/c  DISCHARGE EXAM Blood pressure (!) 119/53, pulse 69, temperature 97.7 F (36.5 C), temperature source Axillary, resp. rate 17, height 5' 8"  (1.727 m), weight 62.6 kg, SpO2 95 %. Pleasant elderly Caucasian male.  Not in  distress. . Afebrile. Head is nontraumatic. Neck is supple without bruit.    Cardiac exam no murmur or gallop. Lungs are clear to auscultation. Distal pulses are well felt.  Neurological Exam : patient is awake alert, slight dysarthria, following simple commands, pupils equally round and reactive to light, blinks to threat bilaterally, extraocular movements intact, right facial droop, intact sensation on the face, hearing intact to voice, tongue midline, he has some right mild hemiparesis but he is intact to light touch in all extremities.  No ataxia or dysmetria finger-to-nose.  Gait deferred.  Discharge Diet   Heart healthy thin liquids  DISCHARGE PLAN Disposition:  Home w/ wife aspirin 81 mg daily and clopidogrel 75 mg daily for secondary stroke prevention for 3 weeks then PLAVIX alone. Ongoing stroke risk factor control by Primary Care Physician at time of discharge Follow-up PCP Crecencio Mc, MD in 2 weeks. Follow-up in [facility] in 4 weeks, office to  schedule an appointment.   35 minutes were spent preparing discharge.  Burnetta Sabin, MSN, APRN, ANVP-BC, AGPCNP-BC Advanced Practice Stroke Nurse [place] for Schedule & Pager information 03/08/2019 3:09 PM   I have personally obtained history,examined this patient, reviewed notes, independently viewed imaging studies, participated in medical decision making and plan of care.ROS completed by me personally and pertinent positives fully documented  I have made any additions or clarifications directly to the above note. Agree with note above.   Antony Contras, MD Medical Director [facility] Pager: [phone] 03/08/2019 4:42 PM

## 2019-03-09 ENCOUNTER — Encounter: Payer: Self-pay | Admitting: Internal Medicine

## 2019-03-09 ENCOUNTER — Telehealth: Payer: Self-pay | Admitting: Lab

## 2019-03-09 ENCOUNTER — Other Ambulatory Visit: Payer: Self-pay

## 2019-03-09 ENCOUNTER — Ambulatory Visit (INDEPENDENT_AMBULATORY_CARE_PROVIDER_SITE_OTHER): Payer: PPO | Admitting: Internal Medicine

## 2019-03-09 ENCOUNTER — Telehealth: Payer: Self-pay | Admitting: Internal Medicine

## 2019-03-09 DIAGNOSIS — Z8673 Personal history of transient ischemic attack (TIA), and cerebral infarction without residual deficits: Secondary | ICD-10-CM | POA: Diagnosis not present

## 2019-03-09 DIAGNOSIS — I10 Essential (primary) hypertension: Secondary | ICD-10-CM | POA: Diagnosis not present

## 2019-03-09 DIAGNOSIS — I729 Aneurysm of unspecified site: Secondary | ICD-10-CM

## 2019-03-09 DIAGNOSIS — Z789 Other specified health status: Secondary | ICD-10-CM

## 2019-03-09 DIAGNOSIS — Z09 Encounter for follow-up examination after completed treatment for conditions other than malignant neoplasm: Secondary | ICD-10-CM

## 2019-03-09 DIAGNOSIS — I63412 Cerebral infarction due to embolism of left middle cerebral artery: Secondary | ICD-10-CM

## 2019-03-09 NOTE — Progress Notes (Signed)
Telephone Note  This visit type was conducted due to national recommendations for restrictions regarding the COVID-19 pandemic (e.g. social distancing).  This format is felt to be most appropriate for this patient at this time.  All issues noted in this document were discussed and addressed.  No physical exam was performed (except for noted visual exam findings with Video Visits).   I connected with@ on 03/09/19 at  1:30 PM EST by telephone and verified that I am speaking with the correct person using two identifiers. Location patient: home Location provider: work or home office Persons participating in the virtual visit: patient, provider  I discussed the limitations, risks, security and privacy concerns of performing an evaluation and management service by telephone and the availability of in person appointments. I also discussed with the patient that there may be a patient responsible charge related to this service. The patient expressed understanding and agreed to proceed.  Reason for visit: hospital follow up  HPI:  86 yr old male with recently diagnosed pancreatic mass,  Essential hypertension and essential thrombocytosis managed with hydroxyurea presents for hospital  follow up  1/1:  Admitted to [facility] with acute resp failure  IN THE SETTING OF PULMONARY FIBROSIS. COVID 19 and influenza a/b  SCREENING WAS NEGATIVE .  Treated with oxygen and steroids,  Sent home on jan 2  Returned to ED   On evening of jan 2 with sudden onset of aphasia and r sided paralysis which resolved en route to hospital.  Symptoms  recurred during ER evaluation and  code stroke protocol was initiated for an acute embolic cva involving the left middle cerebral artery. He received TPA and mechanical  Thrombectomy. He was discharged home yesterday jan 7 in an improved state.  His speech had returned and he was able to walk with a walker.  Loop recorder was placed to rule out atrial fib as source   Since discharge  yesterday he has been doing well.  Can't use a spoon or fork  Yet,  Wife feeding him .  No dysarthria.  Waiting [facility] to [facility] .  Physical therapy has been ordered. He as a walker.  Has not tried to walk yet,  Waiting for PT.   Groin no longer bleeding per wife   His wife had several questions about medications  And these were answered today:  plavix added to previous regimen of low dose aspirin for 3 weeks,  Followed by discontinuation of aspirin and continued use of plavix long term .    Wife unclear about BP meds as well as antibiotics and prednisone instructions from first hospitalization     Has not had any prednisone or doxycycline  That was  Prescribed after the first hospitalization for pulmonary fibrosis exacerbation. He has had no cough or wheezing.   Eating well.   Has not taken any hydralazine or lisinopril since discharge. He resumed  amlodipine last night, and today's reading at home was  120/70   ROS: See pertinent positives and negatives per HPI.  Past Medical History:  Diagnosis Date  . Colon polyps   . Essential thrombocytosis ([place])   . Essential thrombocytosis ([place])   . Hyperlipidemia   . Hypertension   . Lung nodule 12/2004   found on CXR  . Osteoporosis     Past Surgical History:  Procedure Laterality Date  . BONE MARROW BIOPSY  01/18/07  . COLONOSCOPY  2007, 2010   Dr Allen Norris  . IR  CT HEAD LTD  03/04/2019  . IR PERCUTANEOUS ART THROMBECTOMY/INFUSION INTRACRANIAL INC DIAG ANGIO  03/04/2019  . IR US GUIDE BX ASP/DRAIN  03/07/2019  . IR US GUIDE VASC ACCESS RIGHT  03/04/2019  . LOOP RECORDER INSERTION N/A 03/07/2019   Procedure: LOOP RECORDER INSERTION;  Surgeon: Constance Haw, MD;  Location: [place] [facility];  Service: Cardiovascular;  Laterality: N/A;  . NASAL SINUS SURGERY  06/11/05  . RADIOLOGY WITH ANESTHESIA N/A 03/03/2019   Procedure: IR WITH ANESTHESIA;  Surgeon: Luanne Bras, MD;  Location: [place];  Service: Radiology;   Laterality: N/A;    Family History  Problem Relation Age of Onset  . Colon cancer Mother        colon age 84's  . Peripheral vascular disease Father   . Colon cancer Brother        colon age 65's  . Diabetes Other   . Prostate cancer Neg Hx   . Bladder Cancer Neg Hx   . Kidney cancer Neg Hx     SOCIAL HX:  reports that he has never smoked. He has never used smokeless tobacco. He reports that he does not drink alcohol or use drugs.   Current Outpatient Medications:  .  albuterol (VENTOLIN HFA) 108 (90 Base) MCG/ACT inhaler, Inhale 2 puffs into the lungs every 6 (six) hours as needed for wheezing or shortness of breath., Disp: 18 g, Rfl: 0 .  amLODipine (NORVASC) 5 MG tablet, TAKE 1 TABLET BY MOUTH ONCE DAILY, Disp: 90 tablet, Rfl: 1 .  atorvastatin (LIPITOR) 10 MG tablet, Take 1 tablet (10 mg total) by mouth daily at 6 PM., Disp: 30 tablet, Rfl: 2 .  azelastine (ASTELIN) 0.1 % nasal spray, Place 2 sprays into both nostrils 2 (two) times daily. , Disp: , Rfl:  .  beclomethasone (QVAR REDIHALER) 40 MCG/ACT inhaler, Inhale 1 puff into the lungs 2 (two) times daily., Disp: 10.6 g, Rfl: 0 .  benzonatate (TESSALON) 200 MG capsule, Take 1 capsule (200 mg total) by mouth 2 (two) times daily as needed for cough., Disp: 20 capsule, Rfl: 0 .  Calcium Citrate-Vitamin D (CITRACAL + D PO), Take 2 tablets by mouth daily. Take 2 by mouth daily, calcium 630 and vitamin D 500 each. , Disp: , Rfl:  .  Cholecalciferol (VITAMIN D3) 1000 UNITS CAPS, Take 2,000 Units by mouth daily. Take 2 by mouth daily, Disp: , Rfl:  .  clopidogrel (PLAVIX) 75 MG tablet, Take 1 tablet (75 mg total) by mouth daily., Disp: 30 tablet, Rfl: 2 .  clotrimazole-betamethasone (LOTRISONE) cream, Apply topically 2 (two) times daily., Disp: 90 g, Rfl: 5 .  Cyanocobalamin (B-12 COMPLIANCE INJECTION) 1000 MCG/ML KIT, Inject 1,000 mcg as directed every 30 (thirty) days. , Disp: , Rfl:  .  cyanocobalamin 1000 MCG tablet, Take 5,000 mcg by  mouth daily., Disp: , Rfl:  .  docusate sodium (COLACE) 100 MG capsule, Take 100 mg by mouth 2 (two) times daily as needed for mild constipation., Disp: , Rfl:  .  esomeprazole (NEXIUM) 40 MG capsule, TAKE 1 CAPSULE BY MOUTH ONCE DAILY AT NOON (Patient taking differently: Take 40 mg by mouth daily. ), Disp: 90 capsule, Rfl: 3 .  feeding supplement, ENSURE ENLIVE, (ENSURE ENLIVE) LIQD, Take 237 mLs by mouth 2 (two) times daily between meals., Disp: 237 mL, Rfl: 12 .  furosemide (LASIX) 20 MG tablet, TAKE 1 TABLET BY MOUTH EVERY OTHER DAY IN MORNING (Patient taking differently: Take 20 mg by  mouth every other day. TAKE 1 TABLET BY MOUTH EVERY OTHER DAY IN MORNING), Disp: 45 tablet, Rfl: 2 .  hydroxyurea (HYDREA) 500 MG capsule, TAKE 1 CAPSULE BY MOUTH EVERY OTHER DAY MAY TAKE WITH FOOD TO MINIMIZE GI SIDE EFFECTS., Disp: 60 capsule, Rfl: 3 .  Infant Care Products (DERMACLOUD) CREA, Apply 1 application topically as needed (after each bowel movement for barrier for  pressure ulcer.)., Disp: 430 g, Rfl: 2 .  aspirin EC 81 MG tablet, Take 1 tablet (81 mg total) by mouth daily for 21 days. Reported on 08/14/2015 (Patient not taking: Reported on 03/09/2019), Disp: 21 tablet, Rfl: 0 .  testosterone cypionate (DEPOTESTOSTERONE CYPIONATE) 200 MG/ML injection, INJECT 0.25 ML INTRAMUSCULARLY EVERY 14 DAYS AS DIRECTED, Disp: 10 mL, Rfl: 1  EXAM:  VITALS per patient if applicable:   General impression: alert, cooperative and articulate.  No signs of being in distress  Lungs: speech is fluent sentence length suggests that patient is not short of breath and not punctuated by cough, sneezing or sniffing. [name]   Psych: affect normal.  speech is articulate and non pressured .  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  His respiratory symptoms have completely resolved despite the suspension of prednisone/doxy at first discharge.  His blood pressure medications were held for hypotension:  He has resumed amlodipine only;  And is awaiting Plavix and atorvastatin  from pharmacy.  Workup for source of embolic stroke continues. .Patient is stable post discharge and has no new issues.  all  questions about discharge plans were addressed at the visit today for hospital follow up. All labs , imaging studies and progress notes from admission were reviewed with patient today    Pseudoaneurysm ([place]) R groin His wife has been checking his groin and notes no bleeding or increased swelling   Statin intolerance Patient was advised to try to tolerate atorvastatin given acute CVA   Embolic stroke involving left middle cerebral artery ([place]) s/p tPA and mechanical thrombectomy Presenting as a CODE STROKE with aphasia and right sided weakness.  Source of embolism not found during hospitalization,  And  LOOP recorder was placed for arrhythmia monitoring.  Given the question of a pancreatic malignancy, the question of whether the anticoagulant should  be changed to warfarin remains/   Essential hypertension Advised to resume amlodipine only for now,  And resume hydralazine for BP consistently > 620 systolic     I discussed the assessment and treatment plan with the patient. The patient was provided an opportunity to ask questions and all were answered. The patient agreed with the plan and demonstrated an understanding of the instructions.   The patient was advised to call back or seek an in-person evaluation if the symptoms worsen or if the condition fails to improve as  anticipated.    Crecencio Mc, MD

## 2019-03-09 NOTE — Telephone Encounter (Signed)
Pt wife called office wanting to know if he could take Tylenol for leg pain. Pt wife said if he walked earlier today he started having pain in the leg he had surgery on from the knee down, ankle is swollen, No warm, No redness.

## 2019-03-09 NOTE — TOC Transition Note (Signed)
Transition of Care [facility]) - CM/SW Discharge Note   Patient Details  Name: Jimmy Jimenez MRN: [id number] Date of Birth: 03/13/1925  Transition of Care [facility]) CM/SW Contact:  Ella Bodo, RN Phone Number: 03/09/2019, 10:11 AM   Clinical Narrative: Jimmy Jimenez is an 86 y.o. male who was recently discharged from [facility] on Saturday afternoon after having been admitted there for treatment of acute on chronic respiratory failure with hypoxia. He presented to the ED at [facility] on Saturday night after being noted to be suddenly aphasic with right sided weakness at home. CT showing L MCA M2 infarct and more in a watershed distribution.   PTA, pt independent; lives at home with spouse.  PT/OT recommending HH follow up, and pt/spouse agreeable to services.  Referral to [facility] for follow up.  Pt requires no DME.      Final next level of care: Home w Home Health Services Barriers to Discharge: Barriers Resolved   Patient Goals and CMS Choice   CMS Medicare.gov Compare Post Acute Care list provided to:: Patient Represenative (must comment)(wife) Choice offered to / list presented to : Spouse  Discharge Placement                       Discharge Plan and Services   Discharge Planning Services: CM Consult Post Acute Care Choice: Home Health                    HH Arranged: PT, OT [facility] Agency: [facility] Date [place]: 03/08/19 Time [place]: [id number] Representative spoke with at [place]: [place] ([place]) Interventions     Readmission Risk Interventions Readmission Risk Prevention Plan 03/08/2019  Transportation Screening Complete  PCP or Specialist Appt within 5-7 Days Complete  Home Care Screening Complete  Medication Review (RN CM) Complete  Some recent data might be hidden   Reinaldo Raddle, RN, BSN  Trauma/Neuro ICU Case Manager [phone]

## 2019-03-09 NOTE — Patient Instructions (Signed)
Continue only amlodipine for blood pressure for now.  Resume hydralazine if BP over stays  > 140 . It is taken 3 times daily   No lisinopril for now    Continue the baby  Aspirin until the end of January   Continue plavix for life .

## 2019-03-09 NOTE — Telephone Encounter (Signed)
Yes  You can have up to 2000 mg of acetominophen (tylenol) every day safely  In divided doses (500 mg every 6 hours  Or 1000 mg every 12 hours.)   elevate the leg for the swelling

## 2019-03-09 NOTE — Telephone Encounter (Signed)
Transition Care Management Follow-up Telephone Call  Date of discharge and from where: 03/08/2019 Jimmy Jimenez  How have you been since you were released from the hospital? Patient wife reported feeling pretty well, checked BP this morning 120/50 Pulse 77.  Any questions or concerns? Yes concerned should she give BP meds with this blood  Pressure. Patient went into the ER on 03/03/19 at [facility], was released home on Prednisone but did not take because he was admitted later that night back to hospital for CVA. Advised patient wife to go by latest DC instructions and not the one from ER visit on 03/03/19.  Patient has not started Plavix yet waiting for pharmacy delivery and waiting for Qvar inhaler.   Items Reviewed:  Did the pt receive and understand the discharge instructions provided? Yes   Medications obtained and verified? Yes   Any new allergies since your discharge? No   Dietary orders reviewed? No  Do you have support at home? Yes   Functional Questionnaire: (I = Independent and D = Dependent) ADLs: Patient wife is helping, patient can ambulate on short distance with support. Wife performs all ADL's or assists with.  Bathing/Dressing- Dependent  Meal Prep- Dependent  Eating- Dependent  Maintaining continence- Independent  Transferring/Ambulation- dependent  Managing Meds- Dependent  Follow up appointments reviewed:   PCP Hospital f/u appt confirmed? Yes  Scheduled to see 03/09/19 on 03/09/19 @ 1:30.  [facility] f/u appt confirmed? Yes  Scheduled to see 01/07/20121 on 03/09/2019 @ 1:30  Are transportation arrangements needed? No   If their condition worsens, is the pt aware to call PCP or go to the Emergency Dept.? Yes  Was the patient provided with contact information for the PCP's office or ED? Yes  Was to pt encouraged to call back with questions or concerns? Yes

## 2019-03-11 DIAGNOSIS — Z09 Encounter for follow-up examination after completed treatment for conditions other than malignant neoplasm: Secondary | ICD-10-CM | POA: Insufficient documentation

## 2019-03-11 NOTE — Assessment & Plan Note (Signed)
His wife has been checking his groin and notes no bleeding or increased swelling

## 2019-03-11 NOTE — Assessment & Plan Note (Signed)
Patient was advised to try to tolerate atorvastatin given acute CVA

## 2019-03-11 NOTE — Assessment & Plan Note (Addendum)
Patient had 2 back to back admissions , the first for pulmonary fibrosis exacerbation (COVID 19 AND INFLUENZA NEGATIVE)  Followed by readmission within 24 hours for CODE STROKE .  His respiratory symptoms have completely resolved despite the suspension of prednisone/doxy at first discharge.  His blood pressure medications were held for hypotension:  He has resumed amlodipine only;  And is awaiting Plavix and atorvastatin  from pharmacy.  Workup for source of embolic stroke continues. .Patient is stable post discharge and has no new issues.  all  questions about discharge plans were addressed at the visit today for hospital follow up. All labs , imaging studies and progress notes from admission were reviewed with patient today

## 2019-03-11 NOTE — Assessment & Plan Note (Addendum)
Advised to resume amlodipine only for now,  And resume hydralazine for BP consistently > [id number] systolic

## 2019-03-11 NOTE — Assessment & Plan Note (Signed)
Presenting as a CODE STROKE with aphasia and right sided weakness.  Source of embolism not found during hospitalization,  And  LOOP recorder was placed for arrhythmia monitoring.  Given the question of a pancreatic malignancy, the question of whether the anticoagulant should  be changed to warfarin remains/

## 2019-03-12 ENCOUNTER — Other Ambulatory Visit: Payer: Self-pay | Admitting: Interventional Radiology

## 2019-03-12 ENCOUNTER — Telehealth: Payer: Self-pay

## 2019-03-12 DIAGNOSIS — I639 Cerebral infarction, unspecified: Secondary | ICD-10-CM

## 2019-03-12 DIAGNOSIS — I729 Aneurysm of unspecified site: Secondary | ICD-10-CM

## 2019-03-12 NOTE — Telephone Encounter (Signed)
Called Pt back, and spoke with pt wife (Mrs Dolch). Mrs Fiorito was given information about Tylenol, and elevation for swelling in her husbands leg. She stated she understood with no questions.

## 2019-03-12 NOTE — Telephone Encounter (Signed)
The pt wife and niece is on the phone with some questions on how to care for the wound. I told her a wet wash cloth to remove the outer bandage but the Steri strips have to stay on. I told her not to get the steri strips wet and do not put on any ointments. They wanted to cancel the wound check appointment because the pt has a wound check appointment with dr. Earleen Newport. I let them know our EP doctor Camnitz put the ILR in. I told her we can make the appointment virtual. The pt is not able to do virtual. I asked her was there a way she can take a picture of the wound and send the picture in an email for the nurse to take a look at to make sure it is healing properly. The pt wife would like the nurse to call her on [phone] for the appointment. I also let her talk with device nurse Raquel Sarna, Rn.

## 2019-03-12 NOTE — Telephone Encounter (Signed)
Spoke with patient's wife and niece, Otila Kluver, on conference call. Pt's niece is an NP in [place]. Reviewed dressing instructions--pt's wife cannot easily grasp Tegaderm, will try using a washcloth to pull up edge of dressing. Advised to keep steri-strips in place for now, wound check is scheduled as virtual visit on 03/20/19. Gave direct DC phone number if any concerns about steri-strips or incision.   Pt's niece asked about upcoming neurology f/u. Explained that referral is entered but appointment has not yet been scheduled. Pt's wife and niece deny any additional questions or concerns at this time.

## 2019-03-13 ENCOUNTER — Other Ambulatory Visit: Payer: Self-pay

## 2019-03-13 ENCOUNTER — Telehealth: Payer: Self-pay

## 2019-03-13 DIAGNOSIS — L89152 Pressure ulcer of sacral region, stage 2: Secondary | ICD-10-CM

## 2019-03-13 DIAGNOSIS — R531 Weakness: Secondary | ICD-10-CM

## 2019-03-13 DIAGNOSIS — I63412 Cerebral infarction due to embolism of left middle cerebral artery: Secondary | ICD-10-CM

## 2019-03-13 NOTE — Telephone Encounter (Signed)
Kristine Linea, RN with [employer] called because she had called to follow up with Mr. Calkin after his stroke. She stated that the pt was supposed to be getting home health from [facility] for PT because of decreased movement. She stated that she did not see an order in the chart and would like to see if one could be placed for the pt.

## 2019-03-13 NOTE — Addendum Note (Signed)
Addended by: Crecencio Mc on: 03/13/2019 12:02 PM   Modules accepted: Orders

## 2019-03-13 NOTE — Telephone Encounter (Signed)
This is very frustrating1  Why aren't the hospitalists ordering the PT at discharge!!!!His wife told me it was or I would have done it days ago

## 2019-03-14 ENCOUNTER — Telehealth: Payer: Self-pay | Admitting: Internal Medicine

## 2019-03-14 NOTE — Telephone Encounter (Signed)
Patient is constipated has been 2-3 days since BM , says feels like it wants to move but just cannot evacuate bowels. Patient taking Docusate 100 mg twice daily, Patient is not drinking as should, advised to push more fluids. Will forward to PCP for advice.

## 2019-03-14 NOTE — Telephone Encounter (Signed)
She requested to speak with you.Pt wife called about pt being constipated and wants to know what to give for relief. Please advise and Thank you!  Call wife @ [phone].

## 2019-03-14 NOTE — Telephone Encounter (Signed)
Have him try adding philips milk of magnesium

## 2019-03-14 NOTE — Telephone Encounter (Signed)
Patient notified and voiced understanding.

## 2019-03-15 ENCOUNTER — Other Ambulatory Visit: Payer: Self-pay | Admitting: *Deleted

## 2019-03-15 ENCOUNTER — Other Ambulatory Visit: Payer: Self-pay

## 2019-03-15 ENCOUNTER — Ambulatory Visit (INDEPENDENT_AMBULATORY_CARE_PROVIDER_SITE_OTHER): Payer: PPO | Admitting: Lab

## 2019-03-15 DIAGNOSIS — R7989 Other specified abnormal findings of blood chemistry: Secondary | ICD-10-CM | POA: Diagnosis not present

## 2019-03-15 MED ORDER — TESTOSTERONE CYPIONATE 200 MG/ML IM SOLN
50.0000 mg | Freq: Once | INTRAMUSCULAR | Status: AC
  Administered 2019-03-15: 11:00:00 50 mg via INTRAMUSCULAR

## 2019-03-15 NOTE — Patient Outreach (Signed)
[facility]) Care Management  03/15/2019  Jimmy Jimenez 03/13/1925 [id number]   EMMI-stroke On th APL list  RED ON EMMI ALERT Day # 1 Date: 03/13/19 1600 Red Alert Reason: Feeling worse overall? Yes Problems setting up rehab? 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,   Outreach attempt #1 successful at the home number Patient is able to verify HIPAA, DOB and address  He gave [facility] RN CM permission to further speak with his wife Jimmy Jimenez who answered the EMMI questions on 03/13/19 and she reports on 03/15/19 [facility] Care Management RN reviewed and addressed red alert with patient   EMMI:  They state the answers received by the EMMI automated system are incorrect They deny Jimmy Jimenez feeling worse overall nor having problems setting up rehab They confirm that home health services will arrive on 03/16/19 They report he is doing fair but is not back to his baseline state of health as he was able to do more for himself,  Activities of daily living (ADLs) than he is now  They agree to a further follow up call   Social: Jimmy Jimmy Jimenez is a 86 year old retired male who lives at home with his wife, Jimmy Jimenez. They report wonderful support from their son and his family. Jimmy Spearin is able to feed himself but Mrs Pullar report since his hospitalization she has assisted with his medication administration, bathing and dressing. 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 Chronic Diastolic congestive Heart Failure (CHF), pulmonary HTN, bradycardia, pulmonary fibrosis, chronic rhinitis, GERD (gastroesophageal reflux disease)osteoporosis, stage II sacral  decubitus, essential thrombocytosis, Hyperlipidemia (HLD). Edema in both legs, B12 deficiency, mass of pancreas, chest pain, acute blood loss anemia, mild malnutrition   DME: eyeglasses   Medications: they denies concerns with taking medications as prescribed, affording medications, side effects of medications and questions about medications    Appointments: 03/20/19 radiology pacer wound check f/u with cardiology 03/22/19 1100 virtual visit hospital follow up with Dr Derrel Nip 03/26/19 lab Dr Burlene Arnt Oncology hospital f/u mass of pancreas 04/17/19 radiology Pending [facility] neurology appointment  Advance Directives: Denies need for assist with advance directives  Denies need for assist with changes to present advance directives     Consent: [facility] RN CM reviewed [facility] services with patient. Patient gave verbal consent for services [facility] telephonic RN CM and [facility] pharmacy.   Advised patient that there will be further automated EMMI- post discharge calls to assess how the patient is doing following the recent hospitalization Advised the patient that another call may be received from a nurse if any of their responses were abnormal. Patient voiced understanding and was appreciative of f/u call.   Plan: [facility] RN CM will refer Jimmy Jimenez to [place] for  requesting assistance with pill packaging services  [facility] RN CM will follow up with Jimmy Jimenez in 7-14 business days for update on home care, pharmacy needs and further assessment of needs  Pt encouraged to return a call to [place] CM prn  [facility] RN CM sent a successful outreach letter as discussed with [facility] brochure enclosed for review  Routed note to MDs/NP/PA   [place]. Lavina Hamman, RN, BSN, CCM [facility] Telephonic Care Management  Care Coordinator Office number [phone] Mobile number [phone]  Main [facility] number [phone] Fax number [phone]

## 2019-03-16 ENCOUNTER — Telehealth: Payer: Self-pay | Admitting: Internal Medicine

## 2019-03-16 ENCOUNTER — Other Ambulatory Visit: Payer: Self-pay | Admitting: Pharmacist

## 2019-03-16 NOTE — Telephone Encounter (Signed)
No except that if he is straining to have a BM he should take a stool softener. And/or magnesium so he doesn't have to strain.

## 2019-03-16 NOTE — Telephone Encounter (Signed)
Called and notified  patient wife (DPR) and she voiced understanding by writing down instructions and reading back to Nurse.

## 2019-03-16 NOTE — Telephone Encounter (Signed)
Patient is taking the Milk of Magnesia ,and stool softener Docusate sodium 100 mg BID. Recommended on Tuesday per PCP.

## 2019-03-16 NOTE — Telephone Encounter (Signed)
Patient wife called and stated where testosterone injection received yesterday That the injection site is bleeding and has had to change band aids several times,  Due to when patient strains having a BM the site will start back bleeding. Patient was recently started on Plavix on 03/09/19 advised patient DPR (wife) that this medication thins the blood and his straining was causing the bleeding. Advised how to make a compression bandage to area , any other recommendations?

## 2019-03-16 NOTE — Telephone Encounter (Signed)
If he is still straining with that regimen , makes sure he is drinking 60 ounces of water,  And if that doesn't help  have him stop the mill of magnesium and try magnesium oxide 400 mg  Capsules  One daily at night

## 2019-03-18 NOTE — Patient Outreach (Signed)
[facility]) Care Management  [place] 03/16/2019  03/18/2019  Jimmy Jimenez 03/13/1925 [id number]  Reason for referral: Medication Adherence  Referral source: [facility] RN Current insurance: Health Team Advantage  (referral came from an [place])  PMHx includes but not limited to:   Vitamin B12 deficiency, CHF, GERD, hypertension, hyperlipidemia, osteoporosis , sacral decubitus ulcer, pulmonary hypertension and was recently hospitalized for an Embolic Stroke.  Outreach:  Successful telephone call with patient's wife (Jimmy Jimenez).  HIPAA identifiers verified.   Subjective:  Patient's wife reported she just started helping the patient with his medications. Prior to having a stroke, the patient managed his medications on his own. She wondered if a pill pack would be helpful and said [facility] was already working on getting one set up at the time of my call.  Does the patient ever forget to take medication?  yes Does the patient have problems obtaining medications due to transportation?   no Does the patient have problems obtaining medications due to cost?  no  Objective: The ASCVD Risk score Mikey Bussing DC Jr., et al., 2013) failed to calculate for the following reasons:   The 2013 ASCVD risk score is only valid for ages 55 to 82   The patient has a prior MI or stroke diagnosis  Lab Results  Component Value Date   CREATININE 1.07 03/06/2019   CREATININE 0.93 03/04/2019   CREATININE 1.10 03/03/2019    Lab Results  Component Value Date   HGBA1C 4.9 03/04/2019    Lipid Panel     Component Value Date/Time   CHOL 158 03/04/2019 0449   TRIG 64 03/04/2019 0449   HDL 58 03/04/2019 0449   CHOLHDL 2.7 03/04/2019 0449   VLDL 13 03/04/2019 0449   LDLCALC 87 03/04/2019 0449   LDLDIRECT 103.7 09/26/2013 1015    BP Readings from Last 3 Encounters:  03/09/19 (!) 120/50  03/08/19 (!) 111/59  03/03/19 140/60    Allergies  Allergen Reactions  .  Amlodipine     swelling  . Atenolol     swelling  . Erythromycin     Other reaction(s): Hallucination  . Furosemide     Dizziness   . Hctz [Hydrochlorothiazide]     hyponatremia  . Hydrochlorothiazide W-Triamterene     Other reaction(s): Other (See Comments) Patient stated is causes low sodium Patient stated is causes low sodium Patient stated is causes low sodium  Patient stated is causes low sodium   Patient stated is causes low sodium Patient stated is causes low sodium  . Levofloxacin     Causes sleepiness  . Metoprolol     dizziness  . Micardis [Telmisartan]     Causes swelling  . Penicillins Itching    Has patient had a PCN reaction causing immediate rash, facial/tongue/throat swelling, SOB or lightheadedness with hypotension: No Has patient had a PCN reaction causing severe rash involving mucus membranes or skin necrosis: No Has patient had a PCN reaction that required hospitalization No Has patient had a PCN reaction occurring within the last 10 years: No If all of the above answers are "NO", then may proceed with Cephalosporin use.  . Terazosin     Causes swelling in legs and feet  . Triamterene-Hctz     Patient stated is causes low sodium    Medications Reviewed Today    Reviewed by Elayne Guerin, [facility] (Pharmacist) on 03/16/19 at [place] List Status: <None>  Medication Order Taking?  Sig Documenting Provider Last Dose Status Informant  albuterol (VENTOLIN HFA) 108 (90 Base) MCG/ACT inhaler [id number] Yes Inhale 2 puffs into the lungs every 6 (six) hours as needed for wheezing or shortness of breath. Loletha Grayer, MD Taking Active Spouse/Significant Other  amLODipine (NORVASC) 5 MG tablet [id number] Yes TAKE 1 TABLET BY MOUTH ONCE DAILY Crecencio Mc, MD Taking Active Spouse/Significant Other  aspirin EC 81 MG tablet [id number] Yes Take 1 tablet (81 mg total) by mouth daily for 21 days. Reported on 08/14/2015 Donzetta Starch, NP Taking Active   atorvastatin  (LIPITOR) 10 MG tablet [id number] Yes Take 1 tablet (10 mg total) by mouth daily at 6 PM. Donzetta Starch, NP Taking Active   azelastine (ASTELIN) 0.1 % nasal spray [id number] No Place 2 sprays into both nostrils 2 (two) times daily.  [provider] Not Taking Active Spouse/Significant Other  beclomethasone (QVAR REDIHALER) 40 MCG/ACT inhaler [id number]  Inhale 1 puff into the lungs 2 (two) times daily. Loletha Grayer, MD  Active Spouse/Significant Other  benzonatate (TESSALON) 200 MG capsule [id number] Yes Take 1 capsule (200 mg total) by mouth 2 (two) times daily as needed for cough. Crecencio Mc, MD Taking Active Spouse/Significant Other  Calcium Citrate-Vitamin D (CITRACAL + D PO) [id number] Yes Take 2 tablets by mouth daily. Take 2 by mouth daily, calcium 630 and vitamin D 500 each.  [provider] Taking Active Spouse/Significant Other  Cholecalciferol (VITAMIN D3) 1000 UNITS CAPS [id number] Yes Take 2,000 Units by mouth daily. Take 2 by mouth daily [provider] Taking Active Spouse/Significant Other  clopidogrel (PLAVIX) 75 MG tablet [id number] Yes Take 1 tablet (75 mg total) by mouth daily. Donzetta Starch, NP Taking Active   clotrimazole-betamethasone (LOTRISONE) cream [id number]  Apply topically 2 (two) times daily. Crecencio Mc, MD  Active Spouse/Significant Other  Cyanocobalamin (B-12 COMPLIANCE INJECTION) 1000 MCG/ML KIT [id number] Yes Inject 1,000 mcg as directed every 30 (thirty) days.  [provider] Taking Active Spouse/Significant Other  cyanocobalamin 1000 MCG tablet [id number] Yes Take 5,000 mcg by mouth daily. [provider] Taking Active Spouse/Significant Other  docusate sodium (COLACE) 100 MG capsule [id number] Yes Take 100 mg by mouth 2 (two) times daily as needed for mild constipation. [provider] Taking Active Spouse/Significant Other  doxycycline (VIBRAMYCIN) 100 MG capsule [id number] Yes Take 100 mg by mouth 2 (two)  times daily. [provider] Taking Active   esomeprazole (NEXIUM) 40 MG capsule [id number] No TAKE 1 CAPSULE BY MOUTH ONCE DAILY AT NOON  Patient not taking: Reported on 03/16/2019   Crecencio Mc, MD Not Taking Active Spouse/Significant Other  feeding supplement, ENSURE ENLIVE, (ENSURE ENLIVE) LIQD [id number] Yes Take 237 mLs by mouth 2 (two) times daily between meals. Donzetta Starch, NP Taking Active   FLOVENT HFA 110 MCG/ACT inhaler [id number] No Inhale 1 puff into the lungs daily. [provider] Not Taking Active   fluticasone (FLONASE) 50 MCG/ACT nasal spray [id number] No Use 1-2 sprays in each nostril daily as needed [provider] Not Taking Active   furosemide (LASIX) 20 MG tablet [id number] Yes TAKE 1 TABLET BY MOUTH EVERY OTHER DAY IN MORNING  Patient taking differently: Take 20 mg by mouth every other day. TAKE 1 TABLET BY MOUTH EVERY OTHER DAY IN MORNING   Crecencio Mc, MD Taking Active Spouse/Significant Other           Med Note Rosemarie Beath, MELISSA B   Sun Mar 04, 2019 12:02  AM)    hydroxyurea (HYDREA) 500 MG capsule [id number] No TAKE 1 CAPSULE BY MOUTH EVERY OTHER DAY MAY TAKE WITH FOOD TO MINIMIZE GI SIDE EFFECTS.  Patient not taking: Reported on 03/16/2019   Cammie Sickle, MD Not Taking Active Spouse/Significant Other           Med Note Daune Perch   Fri Mar 02, 2019  6:42 AM) Due last night 03/01/2019. Did not take that dose,  [facility]) CREA [id number] Yes Apply 1 application topically as needed (after each bowel movement for barrier for  pressure ulcer.). Crecencio Mc, MD Taking Active Spouse/Significant Other  lisinopril (ZESTRIL) 40 MG tablet [id number] Yes Take 1 tablet by mouth daily. [provider] Taking Active   testosterone cypionate (DEPOTESTOSTERONE CYPIONATE) 200 MG/ML injection [id number] Yes INJECT 0.25 ML INTRAMUSCULARLY EVERY 14 DAYS AS DIRECTED Crecencio Mc, MD Taking Active  Spouse/Significant Other           Med Note Rosemarie Beath, Quincy Simmonds   Sun Mar 04, 2019 12:07 AM) Thursdays          ASSESSMENT: Date Discharged from Hospital:03/08/2019 Date Medication Reconciliation Performed: 03/18/2019  Medications:  New at Discharge: . Atorvastatin . Clopidogrel . Ensure  Discontinued at Discharge:   N/A  Patient was recently discharged from hospital and all medications have been reviewed.  Medication Review Findings:  . Patient's wife had a very difficult time trying to review the patient's medications over the phone. [facility] was called. The Pharmacist confirmed they were working on the patient's pill packs.  However, the following discrepancies were found: o Both QVar and Flovent were on the medication list and were active prescriptions in the patient's pharmacy profile.  Patient does not need both and the patient's wife reported she was not giving him any inhalers at the time of my call due to being overwhelmed. o [facility] filled hydralazine 56m 1 tablet three times daily on 02/26/2019 #90 but hydralazine is not on the patient's medication list. - Dr. TDerrel Nipdocumented on 03/21/19 that the patient was to resume Hydralazine. - Hydralazine 519m1 tablet three times daily was on the discharge summary as well   Plan: Spoke with THToledoharmacist, Catie TrDarnelle MaffucciPharmD, BCACP. Catie is actually embedded in the patient's PCP's office and graciously agreed to follow up with this patient since she has a relationship with the provider and pharmacy.  [facility].  KaElayne GuerinPharmD, BCFort Hoodlinical Pharmacist ([phone]

## 2019-03-19 ENCOUNTER — Ambulatory Visit (INDEPENDENT_AMBULATORY_CARE_PROVIDER_SITE_OTHER): Payer: PPO | Admitting: Pharmacist

## 2019-03-19 ENCOUNTER — Telehealth: Payer: Self-pay | Admitting: Internal Medicine

## 2019-03-19 ENCOUNTER — Other Ambulatory Visit: Payer: Self-pay | Admitting: *Deleted

## 2019-03-19 ENCOUNTER — Ambulatory Visit: Payer: Self-pay | Admitting: Pharmacist

## 2019-03-19 DIAGNOSIS — I110 Hypertensive heart disease with heart failure: Secondary | ICD-10-CM

## 2019-03-19 DIAGNOSIS — I5032 Chronic diastolic (congestive) heart failure: Secondary | ICD-10-CM

## 2019-03-19 DIAGNOSIS — I63412 Cerebral infarction due to embolism of left middle cerebral artery: Secondary | ICD-10-CM

## 2019-03-19 DIAGNOSIS — I10 Essential (primary) hypertension: Secondary | ICD-10-CM | POA: Diagnosis not present

## 2019-03-19 DIAGNOSIS — J8410 Pulmonary fibrosis, unspecified: Secondary | ICD-10-CM

## 2019-03-19 DIAGNOSIS — D473 Essential (hemorrhagic) thrombocythemia: Secondary | ICD-10-CM

## 2019-03-19 NOTE — Patient Instructions (Signed)
Visit Information  Goals Addressed            This Visit's Progress     Patient Stated   . PharmD "We need help with his medications" (pt-stated)       Current Barriers:  . Polypharmacy; complex patient with multiple comorbidities including pancreatic mass, essential thrombocytosis, pulmonary fibrosis (admitted to [facility] 1/1-[facility]/2 for acute respiratory failure) then admit to [facility] 0000000 for embolic CVA in left middle  o Reports twice weekly [facility] RN and therapy (unsure if PT/OT/SLP) will be starting this week. Unsure what day they are coming. . Before stroke, patient managed his medications. Wife is now managing, and is working on adherence packaging w/ [facility]. Spoke w/ centralized [facility] PharmD Denyse Amass on Friday, transferred to me d/t being embedded in this clinic o S/p embolic stroke: ASA 81 mg + clopidogrel 75 mg daily; DAPT x 3 weeks, then d/c ASA and continue clopidogrel (ASA d/t date 03/29/2019); started atorvastatin 10 mg daily (listed hx statin allergy so avoiding high intensity statin) o HTN: discharged on lisinopril + PRN hydralazine, however, at post-discharge hospitalization follow up, it was noted that patient's wife was administering amlodipine 5 mg QPM and hydralazine or lisinopril PRN. Dr. Derrel Nip instructed to give hydralazine 50 mg PRN SBP >140. Wife notes there have been times where SBP is >140 that she has given hydralazine, and maybe sometimes lisinopril, she is unable to recall. Notes that some evenings, if BP normal, she has withheld amlodipine. Reports that last night, BP was normal (cannot remember reading), and held amlodipine, SBP this morning was 142 so she gave hydralazine 50 mg; furosemide 20 mg QAM, notes she gave 1/17 o Thrombocytopenia: follows w/ Dr. Jacinto Reap at [facility]; hydroxyurea 500 mg every other morning; notes she gave 1/17.  o [facility]monary [facility]. Wife notes that the only inhaler she has at home right now is albuterol, and he has not  needed recently. She does not recall the names Qvar or Flovent o Allergies: azelastine nasal spray daily  o GERD: esomeprazole 40 mg; wife notes she has been giving w/ lunch daily  o Recent dx of pancreatic mass. Work up pending.   Pharmacist Clinical Goal(s):  [name] Over the next 90 days, patient will work with PharmD and provider towards optimized medication management  Interventions: . Comprehensive medication review performed; medication list updated in electronic medical record . Reviewed BP instructions per Dr. Lupita Dawn last visit. Wife will consistently give amlodipine QPM, and only give hydralazine if SBP >140. Encouraged wife to document all blood pressure readings (along with time of day and if hydralazine was given) before appointment w/ Dr. Derrel Nip later this week, so that she can adjust regimen or add back lisinopril as needed. . Reviewed inhaler regimen. Wife notes that the [place] provider had never given him a daily inhaler that he was to take regularly. Wife notes that patient's breathing is "good" right now, as he has not needed albuterol HFA. Will collaborate w/ Dr. Derrel Nip at upcoming appointment to determine if daily ICS is needed . Will collaborate w/ Dr. Derrel Nip to reinforce to wife that ASA therapy is to be stopped on 03/29/2019 . Called [facility], spoke with Sam, PharmD. Requested pill packs be filled in the below way. Encouraged wife to go by the pharmacy at her convenience to take current supply of medications to be used to start filling pill packs o AM: ASA, vitamin B12, vitamin D3, clopidogrel, hydroxyurea QOD (last dose 1/17),  furosemide QOD (last dose 1/17) o PM: esomeprazole (moving from midday dosing to reduce confusion), amlodipine, atorvastatin  Patient Self Care Activities:  . Patient will take medications as prescribed . Patient will focus on improved adherence by utilizing pill packs  Initial goal documentation        The patient verbalized understanding of  instructions provided today and declined a print copy of patient instruction materials.    Plan: - Will outreach wife to discuss pill packaging in ~1-2 weeks - F/u call scheduled 04/16/19  Catie Darnelle Maffucci, PharmD, [place], [place] Pharmacist [place] [phone]

## 2019-03-19 NOTE — Patient Outreach (Signed)
RN CM placed call in absence of my colleague Joellyn Quails RN, Red EMMI stroke received for Day #6- Smoked or been around smoke? Yes-  Outreach call to patient's wife Mcallister Dalesio to address, per Mrs. Ritter " this is not correct, no one here smokes"  No new concerns or issues voiced.  Jacqlyn Larsen [facility], [place] Coordinator [phone]

## 2019-03-19 NOTE — Chronic Care Management (AMB) (Signed)
Chronic Care Management   Note  03/19/2019 Name: Jimmy Jimenez MRN: [id number] DOB: 03/13/1925   Subjective:  Jimmy Jimenez is a 86 y.o. year old male who is a primary care patient of Tullo, Aris Everts, MD. The CCM team was consulted for assistance with chronic disease management and care coordination needs.    Contacted patient's wife for medication management review.   Review of patient status, including review of consultants reports, laboratory and other test data, was performed as part of comprehensive evaluation and provision of chronic care management services.   Objective:  Lab Results  Component Value Date   CREATININE 1.07 03/06/2019   CREATININE 0.93 03/04/2019   CREATININE 1.10 03/03/2019    Lab Results  Component Value Date   HGBA1C 4.9 03/04/2019       Component Value Date/Time   CHOL 158 03/04/2019 0449   TRIG 64 03/04/2019 0449   HDL 58 03/04/2019 0449   CHOLHDL 2.7 03/04/2019 0449   VLDL 13 03/04/2019 0449   LDLCALC 87 03/04/2019 0449   LDLDIRECT 103.7 09/26/2013 1015    Clinical ASCVD: Yes  The ASCVD Risk score Mikey Bussing DC Jr., et al., 2013) failed to calculate for the following reasons:   The 2013 ASCVD risk score is only valid for ages 68 to 21   The patient has a prior MI or stroke diagnosis    BP Readings from Last 3 Encounters:  03/09/19 (!) 120/50  03/08/19 (!) 111/59  03/03/19 140/60    Allergies  Allergen Reactions  . Amlodipine     swelling  . Atenolol     swelling  . Erythromycin     Other reaction(s): Hallucination  . Furosemide     Dizziness   . Hctz [Hydrochlorothiazide]     hyponatremia  . Hydrochlorothiazide W-Triamterene     Other reaction(s): Other (See Comments) Patient stated is causes low sodium Patient stated is causes low sodium Patient stated is causes low sodium  Patient stated is causes low sodium   Patient stated is causes low sodium Patient stated is causes low sodium  . Levofloxacin     Causes  sleepiness  . Metoprolol     dizziness  . Micardis [Telmisartan]     Causes swelling  . Penicillins Itching    Has patient had a PCN reaction causing immediate rash, facial/tongue/throat swelling, SOB or lightheadedness with hypotension: No Has patient had a PCN reaction causing severe rash involving mucus membranes or skin necrosis: No Has patient had a PCN reaction that required hospitalization No Has patient had a PCN reaction occurring within the last 10 years: No If all of the above answers are "NO", then may proceed with Cephalosporin use.  . Terazosin     Causes swelling in legs and feet  . Triamterene-Hctz     Patient stated is causes low sodium    Medications Reviewed Today    Reviewed by De Hollingshead, [facility] (Pharmacist) on 03/19/19 at [place] List Status: <None>  Medication Order Taking? Sig Documenting Provider Last Dose Status Informant  albuterol (VENTOLIN HFA) 108 (90 Base) MCG/ACT inhaler [id number] No Inhale 2 puffs into the lungs every 6 (six) hours as needed for wheezing or shortness of breath.  Patient not taking: Reported on 03/19/2019   Loletha Grayer, MD Not Taking Active Spouse/Significant Other  amLODipine (NORVASC) 5 MG tablet [id number] Yes TAKE 1 TABLET BY MOUTH ONCE DAILY Crecencio Mc, MD Taking Active Spouse/Significant Other  Med Note Mayo Ao Mar 19, 2019 11:50 AM) EVENING  aspirin EC 81 MG tablet [id number] Yes Take 1 tablet (81 mg total) by mouth daily for 21 days. Reported on 08/14/2015 Donzetta Starch, NP Taking Active            Med Note Mayo Ao Mar 19, 2019 11:50 AM) AM  atorvastatin (LIPITOR) 10 MG tablet [id number] Yes Take 1 tablet (10 mg total) by mouth daily at 6 PM. Donzetta Starch, NP Taking Active   azelastine (ASTELIN) 0.1 % nasal spray [id number] Yes Place 2 sprays into both nostrils 2 (two) times daily.  [provider] Taking Active Spouse/Significant Other  beclomethasone (QVAR  REDIHALER) 40 MCG/ACT inhaler [id number] No Inhale 1 puff into the lungs 2 (two) times daily.  Patient not taking: Reported on 03/16/2019   Loletha Grayer, MD Not Taking Active Spouse/Significant Other  benzonatate (TESSALON) 200 MG capsule [id number] No Take 1 capsule (200 mg total) by mouth 2 (two) times daily as needed for cough.  Patient not taking: Reported on 03/19/2019   Crecencio Mc, MD Not Taking Active Spouse/Significant Other  Calcium Citrate-Vitamin D (CITRACAL + D PO) [id number] No Take 2 tablets by mouth daily. Take 2 by mouth daily, calcium 630 and vitamin D 500 each.  [provider] Not Taking Active Spouse/Significant Other  Cholecalciferol (VITAMIN D3) 10 MCG (400 UNIT) CAPS [id number] Yes Take 500 Units by mouth daily. Take 2 by mouth daily  [provider] Taking Active Spouse/Significant Other  clopidogrel (PLAVIX) 75 MG tablet [id number] Yes Take 1 tablet (75 mg total) by mouth daily. Donzetta Starch, NP Taking Active   clotrimazole-betamethasone (LOTRISONE) cream [id number] No Apply topically 2 (two) times daily.  Patient not taking: Reported on 03/19/2019   Crecencio Mc, MD Not Taking Active Spouse/Significant Other  Cyanocobalamin (B-12 COMPLIANCE INJECTION) 1000 MCG/ML KIT [id number] Yes Inject 1,000 mcg as directed every 30 (thirty) days.  [provider] Taking Active Spouse/Significant Other  cyanocobalamin 1000 MCG tablet [id number] Yes Take 5,000 mcg by mouth daily. [provider] Taking Active Spouse/Significant Other  docusate sodium (COLACE) 100 MG capsule [id number] Yes Take 100 mg by mouth 2 (two) times daily as needed for mild constipation. [provider] Taking Active Spouse/Significant Other  esomeprazole (NEXIUM) 40 MG capsule [id number] Yes TAKE 1 CAPSULE BY MOUTH ONCE DAILY AT Nicola Police, MD Taking Active Spouse/Significant Other           Med Note Mayo Ao Mar 19, 2019 12:03 PM)    feeding  supplement, Valla Leaver [place]) LIQD [id number] Yes Take 237 mLs by mouth 2 (two) times daily between meals. Donzetta Starch, NP Taking Active            Med Note Mayo Ao Mar 19, 2019 12:04 PM) Using Boost right now        Discontinued 14/48/18 5631 (Duplicate)   fluticasone (FLONASE) 50 MCG/ACT nasal spray [id number] No Use 1-2 sprays in each nostril daily as needed [provider] Not Taking Active   furosemide (LASIX) 20 MG tablet [id number] Yes TAKE 1 TABLET BY MOUTH EVERY OTHER DAY IN MORNING  Patient taking differently: Take 20 mg by mouth every other day. TAKE 1 TABLET BY MOUTH EVERY OTHER DAY IN MORNING   Crecencio Mc, MD Taking Active Spouse/Significant Other  Med Note Rosemarie Beath, MELISSA B   Sun Mar 04, 2019 12:02 AM)    hydrALAZINE (APRESOLINE) 50 MG tablet [id number] Yes Take 50 mg by mouth 3 (three) times daily. [provider] Taking Active            Med Note Mayo Ao Mar 19, 2019 12:06 PM) Using if SBP <140  hydroxyurea (HYDREA) 500 MG capsule [id number] Yes TAKE 1 CAPSULE BY MOUTH EVERY OTHER DAY MAY TAKE WITH FOOD TO MINIMIZE GI SIDE EFFECTS. Cammie Sickle, MD Taking Active Spouse/Significant Other           Med Note Mayo Ao Mar 19, 2019 12:09 PM)    [facility]) CREA [id number] Yes Apply 1 application topically as needed (after each bowel movement for barrier for  pressure ulcer.). Crecencio Mc, MD Taking Active Spouse/Significant Other  lisinopril (ZESTRIL) 40 MG tablet [id number] No Take 1 tablet by mouth daily. [provider] Not Taking Active   testosterone cypionate (DEPOTESTOSTERONE CYPIONATE) 200 MG/ML injection [id number] Yes INJECT 0.25 ML INTRAMUSCULARLY EVERY 14 DAYS AS DIRECTED Crecencio Mc, MD Taking Active Spouse/Significant Other           Med Note Rosemarie Beath, MELISSA B   Sun Mar 04, 2019 12:07 AM) Thursdays           Assessment:    Goals Addressed            This Visit's Progress     Patient Stated   . PharmD "We need help with his medications" (pt-stated)       Current Barriers:  . 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 Unsure what day they are coming. . Before stroke, patient managed his medications. Wife is now managing, and is working on adherence packaging w/ [facility]. Spoke w/ centralized [facility] PharmD Denyse Amass on Friday, transferred to me d/t being embedded in this clinic o S/p embolic stroke: ASA 81 mg + clopidogrel 75 mg daily; DAPT x 3 weeks, then d/c ASA and continue clopidogrel (ASA d/t date 03/29/2019); started atorvastatin 10 mg daily (listed hx statin allergy so avoiding high intensity statin) o HTN: discharged on lisinopril + PRN hydralazine, however, at post-discharge hospitalization follow up, it was noted that patient's wife was administering amlodipine 5 mg QPM and hydralazine or lisinopril PRN. Dr. Derrel Nip instructed to give hydralazine 50 mg PRN SBP >140. Wife notes there have been times where SBP is >140 that she has given hydralazine, and maybe sometimes lisinopril, she is unable to recall. Notes that some evenings, if BP normal, she has withheld amlodipine. Reports that last night, BP was normal (cannot remember reading), and held amlodipine, SBP this morning was 142 so she gave hydralazine 50 mg; furosemide 20 mg QAM, notes she gave 1/17 o Thrombocytopenia: follows w/ Dr. Jacinto Reap at [facility]; hydroxyurea 500 mg every other morning; notes she gave 1/17.  o [facility]. Wife notes that the only inhaler she has at home right now is albuterol, and he has not needed recently.  She does not recall the names Qvar or Flovent o Allergies: azelastine nasal spray daily  o GERD: esomeprazole 40 mg; wife notes she has been  giving w/ lunch daily  o Recent dx of pancreatic mass. Work up pending.   Pharmacist Clinical Goal(s):  [name] Over the next 90 days, patient will work with PharmD and provider towards optimized medication management  Interventions: . Comprehensive medication review performed; medication list updated in electronic medical record . Reviewed BP instructions per Dr. Lupita Dawn last visit. Wife will consistently give amlodipine QPM, and only give hydralazine if SBP >140. Encouraged wife to document all blood pressure readings (along with time of day and if hydralazine was given) before appointment w/ Dr. Derrel Nip later this week, so that she can adjust regimen or add back lisinopril as needed. . Reviewed inhaler regimen. Wife notes that the [place] provider had never given him a daily inhaler that he was to take regularly. Wife notes that patient's breathing is "good" right now, as he has not needed albuterol HFA. Will collaborate w/ Dr. Derrel Nip at upcoming appointment to determine if daily ICS is needed . Will collaborate w/ Dr. Derrel Nip to reinforce to wife that ASA therapy is to be stopped on 03/29/2019 . Called [facility], spoke with Sam, PharmD. Requested pill packs be filled in the below way. Encouraged wife to go by the pharmacy at her convenience to take current supply of medications to be used to start filling pill packs o AM: ASA, vitamin B12, vitamin D3, clopidogrel, hydroxyurea QOD (last dose 1/17), furosemide QOD (last dose 1/17) o PM: esomeprazole (moving from midday dosing to reduce confusion), amlodipine, atorvastatin  Patient Self Care Activities:  . Patient will take medications as prescribed . Patient will focus on improved adherence by utilizing pill packs  Initial goal documentation        Plan: - Will outreach wife to discuss pill packaging in ~1-2  weeks - F/u call scheduled 04/16/19  Catie Darnelle Maffucci, PharmD, [place], CPP Clinical Pharmacist [place] [phone]

## 2019-03-19 NOTE — Chronic Care Management (AMB) (Signed)
  Chronic Care Management   Note  03/19/2019 Name: Sidney Kann MRN: [id number] DOB: 03/13/1925  Imraan Wendell is a 86 y.o. year old male who is a primary care patient of Derrel Nip, Aris Everts, MD. I reached out to [name] by phone today in response to a referral sent by Mr. Yetta Barre PCP, Dr. Deborra Medina     Mr. Shakoor was given information about Chronic Care Management services today including:  1. CCM service includes personalized support from designated clinical staff supervised by his physician, including individualized plan of care and coordination with other care providers 2. 24/7 contact phone numbers for assistance for urgent and routine care needs. 3. Service will only be billed when office clinical staff spend 20 minutes or more in a month to coordinate care. 4. Only one practitioner may furnish and bill the service in a calendar month. 5. The patient may stop CCM services at any time (effective at the end of the month) by phone call to the office staff. 6. The patient will be responsible for cost sharing (co-pay) of up to 20% of the service fee (after annual deductible is met).  Patient agreed to services and verbal consent obtained. Per spouse  Follow up plan: Telephone appointment with care management team member scheduled for:04/16/2019  Glenna Durand, LPN Health Advisor, [place] Management ??[email or web address] ??[phone]

## 2019-03-19 NOTE — Progress Notes (Signed)
Received referral from [facility] PharmD. Placing CCM referral for medication management assistance.   Catie Darnelle Maffucci, PharmD, [place], CPP Clinical Pharmacist [place] [phone]

## 2019-03-20 ENCOUNTER — Telehealth (INDEPENDENT_AMBULATORY_CARE_PROVIDER_SITE_OTHER): Payer: PPO | Admitting: *Deleted

## 2019-03-20 ENCOUNTER — Encounter: Payer: Self-pay | Admitting: *Deleted

## 2019-03-20 ENCOUNTER — Ambulatory Visit
Admission: RE | Admit: 2019-03-20 | Discharge: 2019-03-20 | Disposition: A | Discharge status: Home / Self Care | Payer: PPO | Source: Ambulatory Visit | Attending: Physician Assistant | Admitting: Physician Assistant

## 2019-03-20 ENCOUNTER — Ambulatory Visit
Admission: RE | Admit: 2019-03-20 | Discharge: 2019-03-20 | Disposition: A | Discharge status: Home / Self Care | Payer: PPO | Source: Ambulatory Visit | Attending: Interventional Radiology | Admitting: Interventional Radiology

## 2019-03-20 ENCOUNTER — Telehealth: Payer: Self-pay | Admitting: Emergency Medicine

## 2019-03-20 ENCOUNTER — Other Ambulatory Visit: Payer: Self-pay | Admitting: Physician Assistant

## 2019-03-20 ENCOUNTER — Other Ambulatory Visit: Payer: Self-pay

## 2019-03-20 ENCOUNTER — Telehealth: Payer: Self-pay

## 2019-03-20 DIAGNOSIS — I729 Aneurysm of unspecified site: Secondary | ICD-10-CM

## 2019-03-20 DIAGNOSIS — J84112 Idiopathic pulmonary fibrosis: Secondary | ICD-10-CM

## 2019-03-20 DIAGNOSIS — Z4801 Encounter for change or removal of surgical wound dressing: Secondary | ICD-10-CM

## 2019-03-20 DIAGNOSIS — I639 Cerebral infarction, unspecified: Secondary | ICD-10-CM

## 2019-03-20 HISTORY — PX: IR RADIOLOGIST EVAL & MGMT: IMG5224

## 2019-03-20 NOTE — Telephone Encounter (Signed)
Dr. Jacinto Reap - Please advise on virtual visit option for patient.

## 2019-03-20 NOTE — Telephone Encounter (Signed)
Mr. Feezor has not had EUS performed due to two recent hospitalizations. First  with acute resp failure  IN THE SETTING OF PULMONARY FIBROSIS.  He was discharged I-2-21 and returned on the evening on 03-03-19 and was admitted with embolic stroke involving left middle cerebral artery ([place]) s/p TPA and mechanical thrombectomy. Has an upcoming appointment 03-26-19 with Dr. Rogue Bussing for essential thrombocytosis and labs. Unsure if he is able to attend an in-person office visit at this time due to recent cva.  Per Dr. Sharmaine Base last note in Sept 2020 he will be due cbc/cmp/ca-19-9. He has had some recent labs done. Will route to Dr. Sharmaine Base team and see if he would like to attempt virtual visit if he cannot make in-person visit. Navigator will sign off for EUS at this time. If desired in the future we can address at that time.

## 2019-03-20 NOTE — Progress Notes (Addendum)
Chief Complaint: Follow up acute stroke, SP left MCA thrombectomy  Referring Physician(s): Watterson,Shannon A  PCP: Dr. Deborra Medina Neurology: [facility] Cardiology: Dr. Reggy Eye, Electrophysiology/loop recorder implant  History of Present Illness: Jimmy Jimenez is a 86 y.o. male , right handed, presenting today for scheduled follow up to NIR/VIR clinic, SP treatment of acute left MCA syndrome with cerebral angio and mechanical thrombectomy.   Jimmy Jimenez is here today with his wife, Jimmy Jimenez, [phone]) for follow up.  His niece, Jimmy Jimenez also joined by phone.   He was admitted to [facility] 03/03/2019 with acute ELVO involving the left MCA, and was treated with angiogram and mechanical thrombectomy, restoring TICI-3 flow to the left hemisphere.  Access was via the right CFA.   He was discovered to have a small right CFA pseudoaneurysm when he had some oozing at the puncture site about 1 day later. The pseudoaneurysm was treated 03/07/2019 with thrombin injection, and there was near complete thrombosis.  Repeat duplex performed 03/08/2019 shows smaller but persistent pseudo, estimated 23m x 223m and neck of 31m43m  He was discharged 03/08/2019.   He tells me that he has been doing well at home, with no recurrent symptoms.  He has not yet returned to his baseline. He sleeps on the couch for comfort, but typically gets up at usual time.  He is not yet getting out of the house much.  His wife is helping with much of his activities, including using bathroom, cooking, cleaning, dressing.  He does feed himself confidently.  He complains of bilateral ankle swelling.  He spends majority of the day on the couch.  Not exercising much yet.  Starts PT/OT tomorrow.  He is using a walker in the house, does not need wheelchair.  Can ambulate from room to room.    His strength is nearly recovered in his RUE, but with some residual RLE weakness.  He has no complaints of  the strength/motor of the left UE/LE.   Duplex performed today of the right CFA shows small persisting PSA.    He denies any pain at this site.  He denies any recurrent bulge, bruising, or bleeding.   Past Medical History:  Diagnosis Date  . Colon polyps   . [place]ential [place] ([place] . Essential thrombocytosis ([place] . Hyperlipidemia   . Hypertension   . Lung nodule 12/2004   found on CXR  . Osteoporosis     Past Surgical History:  Procedure Laterality Date  . BONE MARROW BIOPSY  01/18/07  . COLONOSCOPY  2007, 2010   Dr WohAllen Norris IR CT HEAD LTD  03/04/2019  . IR PERCUTANEOUS ART THROMBECTOMY/INFUSION INTRACRANIAL INC DIAG ANGIO  03/04/2019  . IR US [place] BX ASP/DRAIN  03/07/2019  . IR US [place] VASC ACCESS RIGHT  03/04/2019  . LOOP RECORDER INSERTION N/A 03/07/2019   Procedure: LOOP RECORDER INSERTION;  Surgeon: CamConstance HawD;  Location: [facility] [place];  Service: Cardiovascular;  Laterality: N/A;  . NASAL SINUS SURGERY  06/11/05  . RADIOLOGY WITH ANESTHESIA N/A 03/03/2019   Procedure: IR WITH ANESTHESIA;  Surgeon: DevLuanne BrasD;  [facility];  Laterality: N/A;    Allergies: Amlodipine, Atenolol, Erythromycin, Furosemide, Hctz [hydrochlorothiazide], Hydrochlorothiazide w-triamterene, Levofloxacin, Metoprolol, Micardis [telmisartan], Penicillins, Terazosin, and Triamterene-hctz  Medications: Prior to Admission medications   Medication Sig Start Date End Date Taking? Authorizing Provider  albuterol (VENTOLIN HFA) 108 (90  Base) MCG/ACT inhaler Inhale 2 puffs into the lungs every 6 (six) hours as needed for wheezing or shortness of breath. Patient not taking: Reported on 03/19/2019 03/03/19   Loletha Grayer, MD  amLODipine (NORVASC) 5 MG tablet TAKE 1 TABLET BY MOUTH ONCE DAILY 12/26/18   Crecencio Mc, MD  aspirin EC 81 MG tablet Take 1 tablet (81 mg total) by mouth daily for 21 days. Reported on 08/14/2015 03/08/19 03/29/19  Donzetta Starch, NP  atorvastatin (LIPITOR) 10 MG tablet Take 1 tablet (10 mg total) by mouth daily at 6 PM. 03/08/19   Donzetta Starch, NP  azelastine (ASTELIN) 0.1 % nasal spray Place 2 sprays into both nostrils 2 (two) times daily.  02/10/19   [provider]  benzonatate (TESSALON) 200 MG capsule Take 1 capsule (200 mg total) by mouth 2 (two) times daily as needed for cough. Patient not taking: Reported on 03/19/2019 01/04/19   Crecencio Mc, MD  Calcium Citrate-Vitamin D (CITRACAL + D PO) Take 2 tablets by mouth daily. Take 2 by mouth daily, calcium 630 and vitamin D 500 each.     [provider]  Cholecalciferol (VITAMIN D3) 10 MCG (400 UNIT) CAPS Take 500 Units by mouth daily. Take 2 by mouth daily     [provider]  clopidogrel (PLAVIX) 75 MG tablet Take 1 tablet (75 mg total) by mouth daily. 03/09/19   Donzetta Starch, NP  clotrimazole-betamethasone (LOTRISONE) cream Apply topically 2 (two) times daily. Patient not taking: Reported on 03/19/2019 11/28/18   Crecencio Mc, MD  Cyanocobalamin (B-12 COMPLIANCE INJECTION) 1000 MCG/ML KIT Inject 1,000 mcg as directed every 30 (thirty) days.     [provider]  cyanocobalamin 1000 MCG tablet Take 5,000 mcg by mouth daily.    [provider]  docusate sodium (COLACE) 100 MG capsule Take 100 mg by mouth 2 (two) times daily as needed for mild constipation.    [provider]  esomeprazole (NEXIUM) 40 MG capsule TAKE 1 CAPSULE BY MOUTH ONCE DAILY AT NOON 11/27/18   Crecencio Mc, MD  feeding supplement, ENSURE ENLIVE, (ENSURE ENLIVE) LIQD Take 237 mLs by mouth 2 (two) times daily between meals. 03/08/19   Donzetta Starch, NP  fluticasone (FLONASE) 50 MCG/ACT nasal spray Use 1-2 sprays in each nostril daily as needed 03/01/19   [provider]  furosemide (LASIX) 20 MG tablet TAKE 1 TABLET BY MOUTH EVERY OTHER DAY IN MORNING Patient taking differently: Take 20 mg by mouth every other day. TAKE 1 TABLET BY  MOUTH EVERY OTHER DAY IN MORNING 10/31/18   Crecencio Mc, MD  hydrALAZINE (APRESOLINE) 50 MG tablet Take 50 mg by mouth 3 (three) times daily.    [provider]  hydroxyurea (HYDREA) 500 MG capsule TAKE 1 CAPSULE BY MOUTH EVERY OTHER DAY MAY TAKE WITH FOOD TO MINIMIZE GI SIDE EFFECTS. 08/08/18   Cammie Sickle, MD  Infant Care Products ([place]) CREA Apply 1 application topically as needed (after each bowel movement for barrier for  pressure ulcer.). 02/15/19   Crecencio Mc, MD  lisinopril (ZESTRIL) 40 MG tablet Take 1 tablet by mouth daily. 09/15/18   [provider]  testosterone cypionate (DEPOTESTOSTERONE CYPIONATE) 200 MG/ML injection INJECT 0.25 ML INTRAMUSCULARLY EVERY 14 DAYS AS DIRECTED 02/16/19   Crecencio Mc, MD     Family History  Problem Relation Age of Onset  . Colon cancer Mother  colon age 27's  . Peripheral vascular disease Father   . Colon cancer Brother        colon age 83's  . Diabetes Other   . Prostate cancer Neg Hx   . Bladder Cancer Neg Hx   . Kidney cancer Neg Hx     Social History   Socioeconomic History  . Marital status: Married    Spouse name: Not on file  . Number of children: Not on file  . Years of education: Not on file  . Highest education level: Not on file  Occupational History  . Occupation: Retired    Comment: former Nature conservation officer, [employer]  Tobacco Use  . Smoking status: Never Smoker  . Smokeless tobacco: Never Used  Substance and Sexual Activity  . Alcohol use: No  . Drug use: No  . Sexual activity: Not Currently  Other Topics Concern  . Not on file  Social History Narrative  . Not on file   Social Determinants of Health   Financial Resource Strain: Low Risk   . Difficulty of Paying Living Expenses: Not hard at all  Food Insecurity: No Food Insecurity  . Worried About Charity fundraiser in the Last Year: Never true  . Ran Out of Food in the Last Year: Never true  Transportation Needs: No  Transportation Needs  . Lack of Transportation (Medical): No  . Lack of Transportation (Non-Medical): No  Physical Activity:   . Days of Exercise per Week: Not on file  . Minutes of Exercise per Session: Not on file  Stress: No Stress Concern Present  . Feeling of Stress : Not at all  Social Connections: Not Isolated  . Frequency of Communication with Friends and Family: More than three times a week  . Frequency of Social Gatherings with Friends and Family: More than three times a week  . Attends Religious Services: 1 to 4 times per year  . Active Member of Clubs or Organizations: Yes  . Attends Archivist Meetings: 1 to 4 times per year  . Marital Status: Married      Review of Systems: A 12 point ROS discussed and pertinent positives are indicated in the HPI above.  All other systems are negative.  Review of Systems  Vital Signs: BP (!) 150/61 (BP Location: Left Arm)   Pulse 72   Temp 98 F (36.7 C)   SpO2 97%   Physical Exam General: 86 yo male appearing stated age.  Using a wheelchair currently. Well-developed, well-nourished.  No distress. HEENT: Atraumatic, normocephalic.  Glasses. Conjugate gaze, extra-ocular motor intact. No scleral icterus or scleral injection. No lesions on external ears, nose, lips, or gums.  Oral mucosa moist, pink.  Neck: Symmetric with no goiter enlargement.  Chest/Lungs:  Symmetric chest with inspiration/expiration.  No labored breathing.  Clear to auscultation with no wheezes, rhonchi, or rales.  Heart:  RRR, with no third heart sounds appreciated. No JVD appreciated.  Abdomen:  Soft, NT/ND, with + bowel sounds.   Right inguinal region with minimal ecchymosis and without firm bulge.  CFA pulse palpable. No evidence of infection.  Genito-urinary: Deferred Neurologic: Alert & Oriented to person, place, and time.   Flattened affect with normal insight.  Appropriate questions.  Moving all 4 extremities. RUE and LUE symmetric strength with  4/5 strength of the shoulders, elbows, wrists, hand intrinsics. RLE with 3/5 LLE with 4/5. Extremities: Bilateral pitting edema.   Imaging: CT Code Stroke CTA Head W/WO contrast  Result Date:  03/03/2019 CLINICAL DATA:  Initial evaluation for acute stroke. EXAM: CT ANGIOGRAPHY HEAD AND NECK CT PERFUSION BRAIN TECHNIQUE: Multidetector CT imaging of the head and neck was performed using the standard protocol during bolus administration of intravenous contrast. Multiplanar CT image reconstructions and MIPs were obtained to evaluate the vascular anatomy. Carotid stenosis measurements (when applicable) are obtained utilizing NASCET criteria, using the distal internal carotid diameter as the denominator. Multiphase CT imaging of the brain was performed following IV bolus contrast injection. Subsequent parametric perfusion maps were calculated using RAPID software. CONTRAST:  148m OMNIPAQUE IOHEXOL 350 MG/ML SOLN COMPARISON:  Prior noncontrast head CT from earlier same day. FINDINGS: CTA NECK FINDINGS Aortic arch: Visualized aortic arch of normal caliber with normal branch pattern. Moderate atherosclerotic change about the arch and origin of the great vessels without hemodynamically significant stenosis. Visualized subclavian arteries patent. Right carotid system: Right common and internal carotid arteries are mildly tortuous but widely patent without significant stenosis, dissection or occlusion. Left carotid system: Left common and internal carotid arteries are mildly tortuous but widely patent without significant stenosis, dissection or occlusion. Vertebral arteries: Both vertebral arteries arise from the subclavian arteries. Focal plaque at the origin of the right vertebral artery with associated 50% stenosis. Focal non stenotic plaque noted at the origin of the left vertebral artery as well. Vertebral arteries tortuous but otherwise patent within the neck without stenosis, dissection, or occlusion. Skeleton: No  acute osseous abnormality. No worrisome lytic or blastic osseous lesions. Moderate multilevel cervical spondylosis without significant stenosis. Other neck: No other acute soft tissue abnormality within the neck. No mass lesion or adenopathy. Upper chest: Mildly enlarged and partially calcified 12 mm precarinal lymph node noted. Patchy multifocal ground-glass opacities seen within the partially visualized lungs, similar to previous. Superimposed scattered subpleural reticular densities and fibrotic changes. Superimposed 9 mm nodular density at the posterior left upper lobe (series 5, image 169). Additional 9 mm pleural base nodular density partially visualized at the anterior right upper lobe (series 5, image 189). Review of the MIP images confirms the above findings CTA HEAD FINDINGS Anterior circulation: Scattered calcified plaque throughout the petrous, cavernous, and supraclinoid ICAs with associated moderate to severe multifocal narrowing. A1 segments patent. Normal anterior communicating artery. Anterior cerebral arteries widely patent to their distal aspects without stenosis. Left M1 patent. There is occlusive thrombus within the proximal left M2 branch, superior division. Attenuated collateral flow seen distally. Anterior temporal/inferior division remains patent. Right M1 widely patent. Normal right MCA bifurcation. Short-segment severe stenosis at the origin of the right M2 inferior division. Right MCA branches well perfused distally. Posterior circulation: Calcified atheromatous plaque within the dominant left V4 segment with associated moderate multifocal stenosis. Diminutive right vertebral artery widely patent the vertebrobasilar junction. Posterior inferior cerebral arteries patent bilaterally. Basilar widely patent to its distal aspect. Superior cerebral arteries patent bilaterally. Both PCAs primarily supplied via the basilar. Short-segment severe distal left P2 stenosis noted. PCAs otherwise patent  to their distal aspects without stenosis. Venous sinuses: Patent. Anatomic variants: None significant. Review of the MIP images confirms the above findings CT Brain Perfusion Findings: ASPECTS: 9 CBF (<30%) Volume: 171mPerfusion (Tmax>6.0s) volume: 5537mismatch Volume: 50m63mfarction Location:Patchy small volume acute core infarct seen involving the deep white matter of the left cerebral hemisphere, somewhat watershed in distribution. Surrounding ischemic penumbra of 55 cc, with mismatch volume of 44 cc. IMPRESSION: CTA HEAD AND NECK IMPRESSION: 1. Positive CTA for LVO with occlusive thrombus within a proximal left M2 branch,  superior division. 2. Heavy calcified plaque throughout the carotid siphons with associated moderate to severe multifocal narrowing. 3. Short-segment severe proximal right M2 and distal left P2 stenoses. 4. Approximate 50% atheromatous stenoses at the origin of the right vertebral artery as well as involving the left V4 segment. CT PERFUSION IMPRESSION: 1. 11 cc acute core infarct involving the deep white matter of the left cerebral hemisphere, watershed in distribution. Surrounding 55 cc ischemic penumbra involving much of the left MCA. 2. Aspects equals 9 on prior head CT. These results were communicated to Dr. Cheral Marker At 11:22 pmon 1/2/2021by text page via the [facility] messaging system. Electronically Signed   By: Jeannine Boga M.D.   On: 03/03/2019 23:44   DG Chest 2 View  Result Date: 03/01/2019 CLINICAL DATA:  Shortness of breath, cough EXAM: CHEST - 2 VIEW COMPARISON:  09/28/2018 FINDINGS: Stable cardiomediastinal contours. Calcified thoracic aorta. Chronic interstitial lung changes without definite new superimposed airspace opacity. No pleural effusion or pneumothorax. IMPRESSION: Chronic interstitial lung changes without definite superimposed airspace opacity Electronically Signed   By: Davina Poke D.O.   On: 03/01/2019 16:48   CT Code Stroke CTA Neck W/WO  contrast  Result Date: 03/03/2019 CLINICAL DATA:  Initial evaluation for acute stroke. EXAM: CT ANGIOGRAPHY HEAD AND NECK CT PERFUSION BRAIN TECHNIQUE: Multidetector CT imaging of the head and neck was performed using the standard protocol during bolus administration of intravenous contrast. Multiplanar CT image reconstructions and MIPs were obtained to evaluate the vascular anatomy. Carotid stenosis measurements (when applicable) are obtained utilizing NASCET criteria, using the distal internal carotid diameter as the denominator. Multiphase CT imaging of the brain was performed following IV bolus contrast injection. Subsequent parametric perfusion maps were calculated using RAPID software. CONTRAST:  129m OMNIPAQUE IOHEXOL 350 MG/ML SOLN COMPARISON:  Prior noncontrast head CT from earlier same day. FINDINGS: CTA NECK FINDINGS Aortic arch: Visualized aortic arch of normal caliber with normal branch pattern. Moderate atherosclerotic change about the arch and origin of the great vessels without hemodynamically significant stenosis. Visualized subclavian arteries patent. Right carotid system: Right common and internal carotid arteries are mildly tortuous but widely patent without significant stenosis, dissection or occlusion. Left carotid system: Left common and internal carotid arteries are mildly tortuous but widely patent without significant stenosis, dissection or occlusion. Vertebral arteries: Both vertebral arteries arise from the subclavian arteries. Focal plaque at the origin of the right vertebral artery with associated 50% stenosis. Focal non stenotic plaque noted at the origin of the left vertebral artery as well. Vertebral arteries tortuous but otherwise patent within the neck without stenosis, dissection, or occlusion. Skeleton: No acute osseous abnormality. No worrisome lytic or blastic osseous lesions. Moderate multilevel cervical spondylosis without significant stenosis. Other neck: No other acute  soft tissue abnormality within the neck. No mass lesion or adenopathy. Upper chest: Mildly enlarged and partially calcified 12 mm precarinal lymph node noted. Patchy multifocal ground-glass opacities seen within the partially visualized lungs, similar to previous. Superimposed scattered subpleural reticular densities and fibrotic changes. Superimposed 9 mm nodular density at the posterior left upper lobe (series 5, image 169). Additional 9 mm pleural base nodular density partially visualized at the anterior right upper lobe (series 5, image 189). Review of the MIP images confirms the above findings CTA HEAD FINDINGS Anterior circulation: Scattered calcified plaque throughout the petrous, cavernous, and supraclinoid ICAs with associated moderate to severe multifocal narrowing. A1 segments patent. Normal anterior communicating artery. Anterior cerebral arteries widely patent to their distal aspects without  stenosis. Left M1 patent. There is occlusive thrombus within the proximal left M2 branch, superior division. Attenuated collateral flow seen distally. Anterior temporal/inferior division remains patent. Right M1 widely patent. Normal right MCA bifurcation. Short-segment severe stenosis at the origin of the right M2 inferior division. Right MCA branches well perfused distally. Posterior circulation: Calcified atheromatous plaque within the dominant left V4 segment with associated moderate multifocal stenosis. Diminutive right vertebral artery widely patent the vertebrobasilar junction. Posterior inferior cerebral arteries patent bilaterally. Basilar widely patent to its distal aspect. Superior cerebral arteries patent bilaterally. Both PCAs primarily supplied via the basilar. Short-segment severe distal left P2 stenosis noted. PCAs otherwise patent to their distal aspects without stenosis. Venous sinuses: Patent. Anatomic variants: None significant. Review of the MIP images confirms the above findings CT Brain  Perfusion Findings: ASPECTS: 9 CBF (<30%) Volume: 52m Perfusion (Tmax>6.0s) volume: 53mMismatch Volume: 4469mnfarction Location:Patchy small volume acute core infarct seen involving the deep white matter of the left cerebral hemisphere, somewhat watershed in distribution. Surrounding ischemic penumbra of 55 cc, with mismatch volume of 44 cc. IMPRESSION: CTA HEAD AND NECK IMPRESSION: 1. Positive CTA for LVO with occlusive thrombus within a proximal left M2 branch, superior division. 2. Heavy calcified plaque throughout the carotid siphons with associated moderate to severe multifocal narrowing. 3. Short-segment severe proximal right M2 and distal left P2 stenoses. 4. Approximate 50% atheromatous stenoses at the origin of the right vertebral artery as well as involving the left V4 segment. CT PERFUSION IMPRESSION: 1. 11 cc acute core infarct involving the deep white matter of the left cerebral hemisphere, watershed in distribution. Surrounding 55 cc ischemic penumbra involving much of the left MCA. 2. Aspects equals 9 on prior head CT. These results were communicated to Dr. LinCheral Marker 11:22 pmon 1/2/2021by text page via the [facility] system. Electronically Signed   By: BenJeannine BogaD.   On: 03/03/2019 23:44   CT Angio Chest PE W and/or Wo Contrast  Result Date: 03/02/2019 CLINICAL DATA:  Shortness of breath. EXAM: CT ANGIOGRAPHY CHEST WITH CONTRAST TECHNIQUE: Multidetector CT imaging of the chest was performed using the standard protocol during bolus administration of intravenous contrast. Multiplanar CT image reconstructions and MIPs were obtained to evaluate the vascular anatomy. CONTRAST:  52m7mNIPAQUE IOHEXOL 350 MG/ML SOLN COMPARISON:  Radiograph yesterday. Chest CT 04/20/2016 had an outside institution. FINDINGS: Cardiovascular: There are no filling defects within the pulmonary arteries to suggest pulmonary embolus. Mild prominence of the left and right main pulmonary arteries measuring  2.7 and 2.8 cm respectively. Aortic atherosclerosis and tortuosity. No aortic aneurysm or dissection. Coronary artery calcifications. Normal heart size with slight right heart dilatation. Contrast refluxes into the hepatic veins and IVC. Mediastinum/Nodes: Calcified mediastinal and hilar lymph nodes. No noncalcified adenopathy. No esophageal wall thickening. No suspicious thyroid nodule. Lungs/Pleura: Bilateral subpleural reticulation in a basilar predominant distribution, similar to prior exam. Slight increased density within the dependent right greater than left lower lobe reticulation may represent pulmonary microlithiasis, this is also unchanged. No acute airspace disease. No pulmonary edema or pleural effusion. Trachea and mainstem bronchi are patent. No pulmonary mass. Upper Abdomen: Contrast refluxes into the hepatic veins and IVC. No other acute finding. Musculoskeletal: There are no acute or suspicious osseous abnormalities. Review of the MIP images confirms the above findings. IMPRESSION: 1. No pulmonary embolus. 2. Contrast reflux into the hepatic veins and IVC, suggesting elevated right heart pressures. 3. Unchanged chronic interstitial lung disease since 2018, likely pulmonary fibrosis/UIP. Aortic Atherosclerosis (  ICD10-I70.0). Electronically Signed   By: Keith Rake M.D.   On: 03/02/2019 03:06   Jimmy BRAIN WO CONTRAST  Result Date: 03/04/2019 CLINICAL DATA:  Initial evaluation for acute stroke, status post tPA. EXAM: MRI HEAD WITHOUT CONTRAST TECHNIQUE: Multiplanar, multiecho pulse sequences of the brain and surrounding structures were obtained without intravenous contrast. COMPARISON:  Prior CTs from 03/04/2019. FINDINGS: Brain: Generalized age-related cerebral atrophy. Patchy T2/FLAIR hyperintensity within the periventricular and deep white matter both cerebral hemispheres most consistent with chronic small vessel ischemic disease, mild for age. Few scattered small remote bilateral cerebellar  infarcts noted. Patchy restricted diffusion seen involving the left caudate and lentiform nuclei, consistent with acute left MCA territory infarcts. Single additional subcentimeter cortical infarct noted at the adjacent left frontal operculum. No associated hemorrhage or mass effect. No other evidence for acute or subacute ischemia. Gray-white matter differentiation otherwise maintained. No other areas of remote cortical infarction. No foci of susceptibility artifact to suggest acute or chronic intracranial hemorrhage. No mass lesion, midline shift or mass effect. No hydrocephalus. No extra-axial fluid collection. Pituitary gland within normal limits. Vascular: Major intracranial vascular flow voids are maintained. Skull and upper cervical spine: Craniocervical junction normal. Bone marrow signal intensity within normal limits. No scalp soft tissue abnormality. Sinuses/Orbits: Patient status post ocular lens replacement on the left. Globes and orbital soft tissues demonstrate no acute finding. Scattered mucosal thickening noted throughout the paranasal sinuses, most notable within the right sphenoid sinus. Trace left mastoid effusion noted, of doubtful significance. Inner ear structures grossly normal. Other: None. IMPRESSION: 1. Acute ischemic left MCA territory infarcts primarily involving the left caudate and lentiform nuclei. No associated hemorrhage or mass effect. 2. No other acute intracranial abnormality. 3. Age-related cerebral atrophy with mild chronic small vessel ischemic disease. Electronically Signed   By: Jeannine Boga M.D.   On: 03/04/2019 23:01   EP PPM/ICD IMPLANT  Result Date: 03/07/2019 SURGEON:  Allegra Lai, MD   PREPROCEDURE DIAGNOSIS:  Cryptogenic Stroke   POSTPROCEDURE DIAGNOSIS:  Cryptogenic Stroke    PROCEDURES:  1. Implantable loop recorder implantation   INTRODUCTION:  Donavon Kimrey is a 86 y.o. male with a history of unexplained stroke who presents today for implantable loop  implantation.  The patient has had a cryptogenic stroke.  Despite an extensive workup by neurology, no reversible causes have been identified.  he has worn telemetry during which he did not have arrhythmias.  There is significant concern for possible atrial fibrillation as the cause for the patients stroke.  The patient therefore presents today for implantable loop implantation.   DESCRIPTION OF PROCEDURE:  Informed written consent was obtained, and the patient was brought to the electrophysiology lab in a fasting state.  The patient required no sedation for the procedure today.  Mapping over the patient's chest was performed by the EP lab staff to identify the area where electrograms were most prominent for ILR recording.  This area was found to be the left parasternal region over the 3rd-4th intercostal space. The patients left chest was therefore prepped and draped in the usual sterile fashion by the EP lab staff. The skin overlying the left parasternal region was infiltrated with lidocaine for local analgesia.  A 0.5-cm incision was made over the left parasternal region over the 3rd intercostal space.  A subcutaneous ILR pocket was fashioned using a combination of sharp and blunt dissection.  A Medtronic Reveal [place] model [id number] implantable loop recorder was then placed into the pocket  R  waves were very prominent and measured 0.53m. EBL<1 ml.  Steri- Strips and a sterile dressing were then applied.  There were no early apparent complications.   CONCLUSIONS:  1. Successful implantation of a Medtronic Reveal LINQ implantable loop recorder for cryptogenic stroke  2. No early apparent complications.   [place] Ext Art Right Ltd  Result Date: 03/20/2019 CLINICAL DATA:  86year old male with a history of endovascular therapy for acute stroke EXAM: RIGHT LOWER EXTREMITY ARTERIAL DUPLEX SCAN TECHNIQUE: Gray-scale sonography as well as color Doppler and duplex ultrasound was performed to evaluate the  right lower extremity, limited. COMPARISON:  None. FINDINGS: Right lower Extremity Directed duplex of the right lower extremity demonstrates patent common femoral artery. There is an ovoid hypoechoic structure anterior and associated with the common femoral artery measuring 3.0 cm in length and 0.8 cm in depth. There is residual flow from a pseudoaneurysm into the inferior aspect of this collection, with a narrow neck and no significant filling of the aneurysm sac. Directed duplex at the right ankle demonstrates triphasic waveforms of the tibial arteries. 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 Greatest diameter of the thrombosed pseudoaneurysm measures 3 cm. Electronically Signed   By: JCorrie MckusickD.O.   On: 03/20/2019 12:35   IR CT Head Ltd  Result Date: 03/04/2019 INDICATION: 86year old male with a history of acute right-sided symptoms and identification left MCA emergent large vessel occlusion EXAM: ULTRASOUND GUIDED ACCESS RIGHT COMMON FEMORAL ARTERY CERVICAL AND CEREBRAL ANGIOGRAM MECHANICAL THROMBECTOMY OF LEFT MCA DEPLOYMENT OF ANGIO-SEAL FOR HEMOSTASIS COMPARISON:  CT IMAGING SAME DAY MEDICATIONS: 1 g vancomycin. The antibiotic was administered within 1 hour of the procedure ANESTHESIA/SEDATION: The anesthesia team was present to provide general endotracheal tube anesthesia and for patient monitoring during the procedure. Interventional neuro radiology nursing staff was also present. The patient was also continuously cared for during the procedure by the interventional radiology nurse under my direct supervision. CONTRAST:  50 cc IV contrast FLUOROSCOPY TIME:  Fluoroscopy Time: 14 minutes 24 seconds (1,007 mGy). COMPLICATIONS: None immediate. TECHNIQUE: Informed written consent was obtained from the patient after a thorough discussion of the  procedural risks, benefits and alternatives. All questions were addressed. Maximal Sterile Barrier Technique was utilized including caps, mask, sterile gowns, sterile gloves, sterile drape, hand hygiene and skin antiseptic. A timeout was performed prior to the initiation of the procedure. Informed written consent was obtained from the patient's family after a thorough discussion of the procedural risks, benefits and alternatives. Specific risks discussed include: Bleeding, infection, contrast reaction, kidney injury/failure, need for further procedure/surgery, arterial injury or dissection, embolization to new territory, intracranial hemorrhage (10-15% risk), neurologic deterioration, cardiopulmonary collapse, death. All questions were addressed. Maximal Sterile Barrier Technique was utilized including during the procedure including caps, mask, sterile gowns, sterile gloves, sterile drape, hand hygiene and skin antiseptic. A timeout was performed prior to the initiation of the procedure. The anesthesia team was present to provide general endotracheal tube anesthesia and for patient monitoring during the procedure. Interventional neuro radiology nursing staff was also present. FINDINGS: Initial Findings: Left common carotid artery:  Normal course caliber and contour. Left external carotid artery: Patent with antegrade flow. Left internal carotid artery: Normal course caliber and contour of the cervical portion. Vertical and petrous segment patent with normal course caliber contour. Cavernous segment patent. Clinoid segment patent. Antegrade flow of the ophthalmic artery. Ophthalmic  segment patent. Terminus patent. Left MCA:  Proximal M1 segment patent Early temporal branch, supplying the left temporal lobe. Early angiographic image demonstrates significant leptomeningeal collateral flow into the MCA territory from the [facility] territory and the patent temporal branches. Distal M1 occlusion. Left ACA: A 1 segment patent. A  2 segment perfuses the right territory. Patent anterior communicating artery. Leptomeningeal collaterals from the [place] territory through the watershed ANCA territory. Completion Findings: Left MCA: Restoration of fluoro through the MCA after mechanical thrombectomy. TICI 3 flow restored Flat panel CT demonstrates no subarachnoid hemorrhage. PROCEDURE: Patient is brought emergently to the neuro angiography suite, with the patient identified appropriately and placed supine position on the table. Left radial arterial line was placed by the anesthesia team. The patient is then prepped and draped in the usual sterile fashion. Ultrasound survey of the right inguinal region was performed with images stored and sent to PACs. 11 blade scalpel was used to make a small incision. Blunt dissection was performed. A micropuncture needle was used access the right common femoral artery under ultrasound. With excellent arterial blood flow returned, an .018 micro wire was passed through the needle, observed to enter the abdominal aorta under fluoroscopy. The needle was removed, and a micropuncture sheath was placed over the wire. The inner dilator and wire were removed, and an 035 Bentson wire was advanced under fluoroscopy into the abdominal aorta. The sheath was removed and a standard 5 [place] vascular sheath was placed. The dilator was removed and the sheath was flushed. A 41F JB-1 diagnostic catheter was advanced over the wire to the proximal descending thoracic aorta. Wire was then removed. Double flush of the catheter was performed. Catheter was then used to select the left common carotid artery. Formal angiogram was performed demonstrating somewhat tortuous common carotid artery. Standard Glidewire and the JB 1 catheter were used to navigate to the bifurcation. Wire was removed and angiogram was performed. The roadrunner wire was then used to navigate the JB 1 catheter to the petrous segment. Wire was removed. Exchange length  Rosen wire was then passed through the diagnostic catheter to the petrous ICA and the diagnostic catheter was removed. The 5 French sheath was removed and exchanged for 8 French 55 centimeter BrightTip sheath. Sheath was flushed and attached to pressurized and heparinized saline bag for constant forward flow. At this point we attempted to pass the 95 cm Walrus balloon guide catheter through the bright tip sheath. Multiple attempts using the introducer and without the introducer failed, and we ultimately decided that there was a mismatch, perhaps from quality control of the sheath. We then elected to use a flow gait balloon catheter. Eight French 95 cm flow gate balloon catheter was then advanced over the wire to the distal cervical segment. Wire was removed. Then a coaxial intermediate catheter and microcatheter combination was prepared on the back table. This combination was CAT-5 catheter and a Trevo TRK microcatheter, with a synchro soft wire. This combination was then advanced through the balloon guide into the ICA. System was advanced into the internal carotid artery, to the level of the occlusion. The micro wire was then carefully advanced through the occluded segment. Microcatheter was then pushed through the occluded segment and the wire was removed. Intermediate catheter was advanced to the distal balloon guide. Blood was then aspirated through the hub of the microcatheter, and a gentle contrast injection was performed confirming intraluminal position. A rotating hemostatic valve was then attached to the back end of the microcatheter,  and a pressurized and heparinized saline bag was attached to the catheter. 38m x 356mTrevo NXT device was then selected. Back flush was achieved at the rotating hemostatic valve, and then the device was gently advanced through the microcatheter to the distal end. The retriever was then unsheathed by withdrawing the microcatheter under fluoroscopy. Once the retriever was  completely unsheathed, the microcatheter was carefully stripped from the delivery wire of the device. 3 minute time interval was observed. The balloon on the balloon guide was then inflated to profile of the vessel. Constant suction aspiration was then performed through the intermediate catheter, and constant gentle aspiration was performed at the balloon guide. This aspiration was continued as the retriever was gently and slowly withdrawn with fluoroscopic observation into the distal intermediate catheter. The entire system was then gently withdrawn from the intracranial ICA and into the balloon guide. Once the retriever was entirely removed from the system, free aspiration was confirmed at the hub of the balloon guide, with free blood return confirmed. Control angiogram was then performed, confirming restoration of flow. Balloon guide was withdrawn with angiogram performed of the cervical ICA. Balloon guide was removed, and the bright tip sheath was removed and a wire with Angio-Seal deployed. Pulses were confirmed bilateral lower extremity. Patient tolerated the procedure well and remained hemodynamically stable throughout. No complications were encountered. Estimated blood loss approximately 75 cc. IMPRESSION: Status post ultrasound guided access right common femoral artery for left-sided cervical and cerebral angiogram and mechanical thrombectomy of left M1 occlusion via single pass of Trevo 3x32 NXT device. Angio-Seal deployed for hemostasis. Signed, JaDulcy FannyWaDellia NimsRPVI [facility] PL[facility]: Admit to ICU Patient was extubated in the room Blood pressure target of 16858ystolic Right hip straight times 8 hours, status post Angio-Seal deployment. Electronically Signed   By: JaCorrie Mckusick.O.   On: 03/04/2019 01:25   IR US[place]oreauide Vasc Access Right  Result Date: 03/04/2019 INDICATION: [age]ear old male with a history of acute right-sided symptoms and  identification left MCA emergent large vessel occlusion EXAM: ULTRASOUND GUIDED ACCESS RIGHT COMMON FEMORAL ARTERY CERVICAL AND CEREBRAL ANGIOGRAM MECHANICAL THROMBECTOMY OF LEFT MCA DEPLOYMENT OF ANGIO-SEAL FOR HEMOSTASIS COMPARISON:  CT IMAGING SAME DAY MEDICATIONS: 1 g vancomycin. The antibiotic was administered within 1 hour of the procedure ANESTHESIA/SEDATION: The anesthesia team was present to provide general endotracheal tube anesthesia and for patient monitoring during the procedure. Interventional neuro radiology nursing staff was also present. The patient was also continuously cared for during the procedure by the interventional radiology nurse under my direct supervision. CONTRAST:  50 cc IV contrast FLUOROSCOPY TIME:  Fluoroscopy Time: 14 minutes 24 seconds (1,007 mGy). COMPLICATIONS: None immediate. TECHNIQUE: Informed written consent was obtained from the patient after a thorough discussion of the procedural risks, benefits and alternatives. All questions were addressed. Maximal Sterile Barrier Technique was utilized including caps, mask, sterile gowns, sterile gloves, sterile drape, hand hygiene and skin antiseptic. A timeout was performed prior to the initiation of the procedure. Informed written consent was obtained from the patient's family after a thorough discussion of the procedural risks, benefits and alternatives. Specific risks discussed include: Bleeding, infection, contrast reaction, kidney injury/failure, need for further procedure/surgery, arterial injury or dissection, embolization to new territory, intracranial hemorrhage (10-15% risk), neurologic deterioration, cardiopulmonary collapse, death. All questions were addressed. Maximal Sterile Barrier Technique was utilized including during the procedure including caps, mask, sterile gowns, sterile gloves, sterile drape, hand hygiene and skin antiseptic. A  timeout was performed prior to the initiation of the procedure. The anesthesia team  was present to provide general endotracheal tube anesthesia and for patient monitoring during the procedure. Interventional neuro radiology nursing staff was also present. FINDINGS: Initial Findings: Left common carotid artery:  Normal course caliber and contour. Left external carotid artery: Patent with antegrade flow. Left internal carotid artery: Normal course caliber and contour of the cervical portion. Vertical and petrous segment patent with normal course caliber contour. Cavernous segment patent. Clinoid segment patent. Antegrade flow of the ophthalmic artery. Ophthalmic segment patent. Terminus patent. Left MCA:  Proximal M1 segment patent Early temporal branch, supplying the left temporal lobe. Early angiographic image demonstrates significant leptomeningeal collateral flow into the MCA territory from the [facility] territory and the patent temporal branches. Distal M1 occlusion. Left ACA: A 1 segment patent. A 2 segment perfuses the right territory. Patent anterior communicating artery. Leptomeningeal collaterals from the [place] territory through the watershed ANCA territory. Completion Findings: Left MCA: Restoration of fluoro through the MCA after mechanical thrombectomy. TICI 3 flow restored Flat panel CT demonstrates no subarachnoid hemorrhage. PROCEDURE: Patient is brought emergently to the neuro angiography suite, with the patient identified appropriately and placed supine position on the table. Left radial arterial line was placed by the anesthesia team. The patient is then prepped and draped in the usual sterile fashion. Ultrasound survey of the right inguinal region was performed with images stored and sent to PACs. 11 blade scalpel was used to make a small incision. Blunt dissection was performed. A micropuncture needle was used access the right common femoral artery under ultrasound. With excellent arterial blood flow returned, an .018 micro wire was passed through the needle, observed to enter the  abdominal aorta under fluoroscopy. The needle was removed, and a micropuncture sheath was placed over the wire. The inner dilator and wire were removed, and an 035 Bentson wire was advanced under fluoroscopy into the abdominal aorta. The sheath was removed and a standard 5 [place] vascular sheath was placed. The dilator was removed and the sheath was flushed. A 10F JB-1 diagnostic catheter was advanced over the wire to the proximal descending thoracic aorta. Wire was then removed. Double flush of the catheter was performed. Catheter was then used to select the left common carotid artery. Formal angiogram was performed demonstrating somewhat tortuous common carotid artery. Standard Glidewire and the JB 1 catheter were used to navigate to the bifurcation. Wire was removed and angiogram was performed. The roadrunner wire was then used to navigate the JB 1 catheter to the petrous segment. Wire was removed. Exchange length Rosen wire was then passed through the diagnostic catheter to the petrous ICA and the diagnostic catheter was removed. The 5 French sheath was removed and exchanged for 8 French 55 centimeter BrightTip sheath. Sheath was flushed and attached to pressurized and heparinized saline bag for constant forward flow. At this point we attempted to pass the 95 cm Walrus balloon guide catheter through the bright tip sheath. Multiple attempts using the introducer and without the introducer failed, and we ultimately decided that there was a mismatch, perhaps from quality control of the sheath. We then elected to use a flow gait balloon catheter. Eight French 95 cm flow gate balloon catheter was then advanced over the wire to the distal cervical segment. Wire was removed. Then a coaxial intermediate catheter and microcatheter combination was prepared on the back table. This combination was CAT-5 catheter and a Trevo TRK microcatheter, with a synchro soft wire.  This combination was then advanced through the balloon  guide into the ICA. System was advanced into the internal carotid artery, to the level of the occlusion. The micro wire was then carefully advanced through the occluded segment. Microcatheter was then pushed through the occluded segment and the wire was removed. Intermediate catheter was advanced to the distal balloon guide. Blood was then aspirated through the hub of the microcatheter, and a gentle contrast injection was performed confirming intraluminal position. A rotating hemostatic valve was then attached to the back end of the microcatheter, and a pressurized and heparinized saline bag was attached to the catheter. 20m x 316mTrevo NXT device was then selected. Back flush was achieved at the rotating hemostatic valve, and then the device was gently advanced through the microcatheter to the distal end. The retriever was then unsheathed by withdrawing the microcatheter under fluoroscopy. Once the retriever was completely unsheathed, the microcatheter was carefully stripped from the delivery wire of the device. 3 minute time interval was observed. The balloon on the balloon guide was then inflated to profile of the vessel. Constant suction aspiration was then performed through the intermediate catheter, and constant gentle aspiration was performed at the balloon guide. This aspiration was continued as the retriever was gently and slowly withdrawn with fluoroscopic observation into the distal intermediate catheter. The entire system was then gently withdrawn from the intracranial ICA and into the balloon guide. Once the retriever was entirely removed from the system, free aspiration was confirmed at the hub of the balloon guide, with free blood return confirmed. Control angiogram was then performed, confirming restoration of flow. Balloon guide was withdrawn with angiogram performed of the cervical ICA. Balloon guide was removed, and the bright tip sheath was removed and a wire with Angio-Seal deployed. Pulses  were confirmed bilateral lower extremity. Patient tolerated the procedure well and remained hemodynamically stable throughout. No complications were encountered. Estimated blood loss approximately 75 cc. IMPRESSION: Status post ultrasound guided access right common femoral artery for left-sided cervical and cerebral angiogram and mechanical thrombectomy of left M1 occlusion via single pass of Trevo 3x32 NXT device. Angio-Seal deployed for hemostasis. Signed, JaDulcy FannyWaDellia NimsRPVI [facility] PL[facility]: Admit to ICU Patient was extubated in the room Blood pressure target of [id number] Right hip straight times 8 hours, status post Angio-Seal deployment. Electronically Signed   By: JaCorrie Mckusick.O.   On: 03/04/2019 01:25   IR USKoreauide Bx Asp/Drain  Result Date: 03/07/2019 INDICATION: [id number] old male status post large vessel stroke thrombectomy now complicated by small pseudoaneurysm formation at the right common femoral arterial access. He is symptomatic and continues to bleed at the puncture site. EXAM: Ultrasound-guided thrombin injection of pseudoaneurysm MEDICATIONS: None ANESTHESIA/SEDATION: None CONTRAST:  None FLUOROSCOPY TIME:  None COMPLICATIONS: None immediate. PROCEDURE: Informed consent was obtained from the patient following explanation of the procedure, risks, benefits and alternatives. The patient understands, agrees and consents for the procedure. All questions were addressed. A time out was performed prior to the initiation of the procedure. Ultrasound was used to interrogate the right groin. There is a small pulsatile pseudoaneurysm measuring approximately 1.4 x 0.8 cm. A suitable skin entry site was selected and marked. The region was sterilely prepped and draped in the standard fashion using chlorhexidine skin prep. A 21 gauge micropuncture needle was then carefully advanced under real-time ultrasound guidance and used to puncture the  pseudoaneurysm. There was return of pulsatile blood. Bovine thrombin was reconstituted  with a concentration of 5000 mcg in 5 mL sterile saline. 1 mL was drawn up in a medallion syringe and carefully injected in small aliquots in till there was evidence of thrombosis of the pseudoaneurysm on ultrasound. Approximately 200 mcg was injected. The needle was removed.  Sterile bandage was placed. The distal pulses at the foot were evaluated before and after the injection procedure. There was no change in the easily palpable dorsalis pedis pulse. IMPRESSION: Successful thrombin injection with thrombosis of the right common femoral artery pseudoaneurysm. Electronically Signed   By: Jacqulynn Cadet M.D.   On: 03/07/2019 16:15   CT Code Stroke Cerebral Perfusion with contrast  Result Date: 03/03/2019 CLINICAL DATA:  Initial evaluation for acute stroke. EXAM: CT ANGIOGRAPHY HEAD AND NECK CT PERFUSION BRAIN TECHNIQUE: Multidetector CT imaging of the head and neck was performed using the standard protocol during bolus administration of intravenous contrast. Multiplanar CT image reconstructions and MIPs were obtained to evaluate the vascular anatomy. Carotid stenosis measurements (when applicable) are obtained utilizing NASCET criteria, using the distal internal carotid diameter as the denominator. Multiphase CT imaging of the brain was performed following IV bolus contrast injection. Subsequent parametric perfusion maps were calculated using RAPID software. CONTRAST:  138m OMNIPAQUE IOHEXOL 350 MG/ML SOLN COMPARISON:  Prior noncontrast head CT from earlier same day. FINDINGS: CTA NECK FINDINGS Aortic arch: Visualized aortic arch of normal caliber with normal branch pattern. Moderate atherosclerotic change about the arch and origin of the great vessels without hemodynamically significant stenosis. Visualized subclavian arteries patent. Right carotid system: Right common and internal carotid arteries are mildly tortuous but  widely patent without significant stenosis, dissection or occlusion. Left carotid system: Left common and internal carotid arteries are mildly tortuous but widely patent without significant stenosis, dissection or occlusion. Vertebral arteries: Both vertebral arteries arise from the subclavian arteries. Focal plaque at the origin of the right vertebral artery with associated 50% stenosis. Focal non stenotic plaque noted at the origin of the left vertebral artery as well. Vertebral arteries tortuous but otherwise patent within the neck without stenosis, dissection, or occlusion. Skeleton: No acute osseous abnormality. No worrisome lytic or blastic osseous lesions. Moderate multilevel cervical spondylosis without significant stenosis. Other neck: No other acute soft tissue abnormality within the neck. No mass lesion or adenopathy. Upper chest: Mildly enlarged and partially calcified 12 mm precarinal lymph node noted. Patchy multifocal ground-glass opacities seen within the partially visualized lungs, similar to previous. Superimposed scattered subpleural reticular densities and fibrotic changes. Superimposed 9 mm nodular density at the posterior left upper lobe (series 5, image 169). Additional 9 mm pleural base nodular density partially visualized at the anterior right upper lobe (series 5, image 189). Review of the MIP images confirms the above findings CTA HEAD FINDINGS Anterior circulation: Scattered calcified plaque throughout the petrous, cavernous, and supraclinoid ICAs with associated moderate to severe multifocal narrowing. A1 segments patent. Normal anterior communicating artery. Anterior cerebral arteries widely patent to their distal aspects without stenosis. Left M1 patent. There is occlusive thrombus within the proximal left M2 branch, superior division. Attenuated collateral flow seen distally. Anterior temporal/inferior division remains patent. Right M1 widely patent. Normal right MCA bifurcation.  Short-segment severe stenosis at the origin of the right M2 inferior division. Right MCA branches well perfused distally. Posterior circulation: Calcified atheromatous plaque within the dominant left V4 segment with associated moderate multifocal stenosis. Diminutive right vertebral artery widely patent the vertebrobasilar junction. Posterior inferior cerebral arteries patent bilaterally. Basilar widely patent to its distal  aspect. Superior cerebral arteries patent bilaterally. Both PCAs primarily supplied via the basilar. Short-segment severe distal left P2 stenosis noted. PCAs otherwise patent to their distal aspects without stenosis. Venous sinuses: Patent. Anatomic variants: None significant. Review of the MIP images confirms the above findings CT Brain Perfusion Findings: ASPECTS: 9 CBF (<30%) Volume: 82m Perfusion (Tmax>6.0s) volume: 553mMismatch Volume: 4442mnfarction Location:Patchy small volume acute core infarct seen involving the deep white matter of the left cerebral hemisphere, somewhat watershed in distribution. Surrounding ischemic penumbra of 55 cc, with mismatch volume of 44 cc. IMPRESSION: CTA HEAD AND NECK IMPRESSION: 1. Positive CTA for LVO with occlusive thrombus within a proximal left M2 branch, superior division. 2. Heavy calcified plaque throughout the carotid siphons with associated moderate to severe multifocal narrowing. 3. Short-segment severe proximal right M2 and distal left P2 stenoses. 4. Approximate 50% atheromatous stenoses at the origin of the right vertebral artery as well as involving the left V4 segment. CT PERFUSION IMPRESSION: 1. 11 cc acute core infarct involving the deep white matter of the left cerebral hemisphere, watershed in distribution. Surrounding 55 cc ischemic penumbra involving much of the left MCA. 2. Aspects equals 9 on prior head CT. These results were communicated to Dr. LinCheral Marker 11:22 pmon 1/2/2021by text page via the [facility].  Electronically Signed   By: BenJeannine BogaD.   On: 03/03/2019 23:44   VAS US [place] PSEUDOANEUR[place]M  Result Date: 03/08/2019  ARTERIAL PSEUDOANEURYSM  Exam: Right groin Indications: Patient complains of Pseudoaneurysm detected 03/07/2019. History: Pseudoaneurysm injection 03/07/2019. Performing Technologist: GreOliver HumT  Examination Guidelines: A complete evaluation includes B-mode imaging, spectral Doppler, color Doppler, and power Doppler as needed of all accessible portions of each vessel. Bilateral testing is considered an integral part of a complete examination. Limited examinations for reoccurring indications may be performed as noted. +------------+----------+---------+------+----------+ Right DuplexPSV (cm/s)Waveform PlaqueComment(s) +------------+----------+---------+------+----------+ CFA                   triphasic                 +------------+----------+---------+------+----------+ Prox SFA              triphasic                 +------------+----------+---------+------+----------+ Right Vein comments:Posterior tibial, peroneal, and anterior tibial artery waveforms are noted to be triphasic.  Findings: An area with well defined borders measuring 1.2 cm x 0.7 cm was visualized arising off of the common femoral artery with ultrasound characteristics of a pseudoaneurysm. Mixed echos within the structure suggest that it is partially thrombosed with a residual diameter of 0.4 cm x 0.2 cm. The neck measures approximately 0.2 cm wide and 0.2 cm long.  Diagnosing physician: ChrMonica Martinez Electronically signed by ChrMonica Martinez on 03/08/2019 at 5:09:02 PM.   --------------------------------------------------------------------------------    Final    VAS US [place] PSEUDOANEUR[place]M  Result Date: 03/07/2019  ARTERIAL PSEUDOANEURYSM  Exam: Right groin Indications: Patient complains of palpable knot and bleeding. History: S/p catheterization. Comparison Study: No prior  study. Performing Technologist: MicMaudry MayhewA, RDMS, RVT, RDCS  Examination Guidelines: A complete evaluation includes B-mode imaging, spectral Doppler, color Doppler, and power Doppler as needed of all accessible portions of each vessel. Bilateral testing is considered an integral part of a complete examination. Limited examinations for reoccurring indications may be performed as noted. +------------+----------+--------+------+----------+ Right DuplexPSV (cm/s)WaveformPlaqueComment(s) +------------+----------+--------+------+----------+ CFA  152    biphasic                 +------------+----------+--------+------+----------+ Right Vein comments:patent right CFV  Findings: An area with well defined borders measuring 0.9 cm x 0.5 cm was visualized arising off of the right CFA with ultrasound characteristics of a pseudoaneurysm. Mixed echos within the structure suggest that it is partially thrombosed with a residual diameter  of 0.8 cm x 0.5 cm. The neck measures approximately 0.3 cm wide and 0.5 cm long. Distal right PTA is patent with biphasic flow.  Diagnosing physician: Curt Jews MD Electronically signed by Curt Jews MD on 03/07/2019 at 4:41:44 PM.   --------------------------------------------------------------------------------    Final    ECHOCARDIOGRAM COMPLETE  Result Date: 03/05/2019   ECHOCARDIOGRAM REPORT   Patient Name:   Jimmy Jimenez Date of Exam: 03/05/2019 Medical Rec #:  [id number]     Height:       68.0 in Accession #:    [id number]    Weight:       138.0 lb Date of Birth:  03/13/1925     BSA:          1.75 m Patient Age:    72 years      BP:           161/65 mmHg Patient Gender: M             HR:           89 bpm. Exam Location:  Inpatient Procedure: 2D Echo Indications:    434.91 stroke  History:        Patient has prior history of Echocardiogram examinations, most                 recent 11/21/2012. Risk Factors:Hypertension and Dyslipidemia.  Sonographer:    Jannett Celestine RDCS (AE) Referring Phys: [phone] ERIC LINDZEN  Sonographer Comments: Suboptimal subcostal window. IMPRESSIONS  1. Left ventricular ejection fraction, by visual estimation, is 65 to 70%. The left ventricle has normal function. There is moderately increased left ventricular hypertrophy.  2. Left ventricular diastolic parameters are consistent with Grade I diastolic dysfunction (impaired relaxation).  3. The left ventricle has no regional wall motion abnormalities.  4. Global right ventricle has normal systolic function.The right ventricular size is normal. No increase in right ventricular wall thickness.  5. Left atrial size was normal.  6. Right atrial size was normal.  7. The mitral valve is normal in structure. Trivial mitral valve regurgitation. No evidence of mitral stenosis.  8. The tricuspid valve is normal in structure.  9. The aortic valve is tricuspid. Aortic valve regurgitation is mild. Mild aortic valve sclerosis without stenosis. 10. The pulmonic valve was normal in structure. Pulmonic valve regurgitation is not visualized. 11. Aortic dilatation noted. 12. There is mild dilatation of the ascending aorta measuring 40 mm. 13. Severely elevated pulmonary artery systolic pressure. 14. The atrial septum is grossly normal. FINDINGS  Left Ventricle: Left ventricular ejection fraction, by visual estimation, is 65 to 70%. The left ventricle has normal function. The left ventricle has no regional wall motion abnormalities. There is moderately increased left ventricular hypertrophy. Concentric left ventricular hypertrophy. Left ventricular diastolic parameters are consistent with Grade I diastolic dysfunction (impaired relaxation). Right Ventricle: The right ventricular size is normal. No increase in right ventricular wall thickness. Global RV systolic function is has normal systolic function. Left Atrium: Left atrial size was normal in size. Right Atrium: Right atrial size was normal in  size Pericardium:  There is no evidence of pericardial effusion. Mitral Valve: The mitral valve is normal in structure. Trivial mitral valve regurgitation. No evidence of mitral valve stenosis by observation. Tricuspid Valve: The tricuspid valve is normal in structure. Tricuspid valve regurgitation is mild. Aortic Valve: The aortic valve is tricuspid. Aortic valve regurgitation is mild. Mild aortic valve sclerosis is present, with no evidence of aortic valve stenosis. Mild aortic valve annular calcification. Pulmonic Valve: The pulmonic valve was normal in structure. Pulmonic valve regurgitation is not visualized. Pulmonic regurgitation is not visualized. Aorta: Aortic dilatation noted. There is mild dilatation of the ascending aorta measuring 40 mm. IAS/Shunts: The atrial septum is grossly normal.  LEFT VENTRICLE PLAX 2D LVIDd:         3.11 cm  Diastology LVIDs:         1.88 cm  LV e' lateral:   8.70 cm/s LV PW:         1.32 cm  LV E/e' lateral: 6.9 LV IVS:        1.40 cm  LV e' medial:    8.16 cm/s LVOT diam:     2.20 cm  LV E/e' medial:  7.4 LV SV:         27 ml LV SV Index:   15.81 LVOT Area:     3.80 cm  LEFT ATRIUM             Index       RIGHT ATRIUM           Index LA diam:        2.60 cm 1.49 cm/m  RA Area:     13.80 cm LA Vol (A2C):   50.7 ml 29.04 ml/m RA Volume:   28.00 ml  16.04 ml/m LA Vol (A4C):   35.7 ml 20.45 ml/m LA Biplane Vol: 45.1 ml 25.84 ml/m  AORTIC VALVE LVOT Vmax:   95.10 cm/s LVOT Vmean:  68.900 cm/s LVOT VTI:    0.210 m  AORTA Ao Root diam: 3.90 cm MITRAL VALVE                        TRICUSPID VALVE MV Area (PHT): 3.77 cm             TR Peak grad:   58.1 mmHg MV PHT:        58.29 msec           TR Vmax:        416.00 cm/s MV Decel Time: 201 msec MV E velocity: 60.00 cm/s 103 cm/s  SHUNTS MV A velocity: 84.50 cm/s 70.3 cm/s Systemic VTI:  0.21 m MV E/A ratio:  0.71       1.5       Systemic Diam: 2.20 cm  [name] MD Electronically signed by [name] MD Signature Date/Time: 03/05/2019/1:08:04  PM    Final    IR PERCUTANEOUS ART THROMBECTOMY/INFUSION INTRACRANIAL INC DIAG ANGIO  Result Date: 03/04/2019 INDICATION: 86 year old male with a history of acute right-sided symptoms and identification left MCA emergent large vessel occlusion EXAM: ULTRASOUND GUIDED ACCESS RIGHT COMMON FEMORAL ARTERY CERVICAL AND CEREBRAL ANGIOGRAM MECHANICAL THROMBECTOMY OF LEFT MCA DEPLOYMENT OF ANGIO-SEAL FOR HEMOSTASIS COMPARISON:  CT IMAGING SAME DAY MEDICATIONS: 1 g vancomycin. The antibiotic was administered within 1 hour of the procedure ANESTHESIA/SEDATION: The anesthesia team was present to provide general endotracheal tube anesthesia and for patient monitoring during the procedure. Interventional neuro radiology nursing staff was  also present. The patient was also continuously cared for during the procedure by the interventional radiology nurse under my direct supervision. CONTRAST:  50 cc IV contrast FLUOROSCOPY TIME:  Fluoroscopy Time: 14 minutes 24 seconds (1,007 mGy). COMPLICATIONS: None immediate. TECHNIQUE: Informed written consent was obtained from the patient after a thorough discussion of the procedural risks, benefits and alternatives. All questions were addressed. Maximal Sterile Barrier Technique was utilized including caps, mask, sterile gowns, sterile gloves, sterile drape, hand hygiene and skin antiseptic. A timeout was performed prior to the initiation of the procedure. Informed written consent was obtained from the patient's family after a thorough discussion of the procedural risks, benefits and alternatives. Specific risks discussed include: Bleeding, infection, contrast reaction, kidney injury/failure, need for further procedure/surgery, arterial injury or dissection, embolization to new territory, intracranial hemorrhage (10-15% risk), neurologic deterioration, cardiopulmonary collapse, death. All questions were addressed. Maximal Sterile Barrier Technique was utilized including during the  procedure including caps, mask, sterile gowns, sterile gloves, sterile drape, hand hygiene and skin antiseptic. A timeout was performed prior to the initiation of the procedure. The anesthesia team was present to provide general endotracheal tube anesthesia and for patient monitoring during the procedure. Interventional neuro radiology nursing staff was also present. FINDINGS: Initial Findings: Left common carotid artery:  Normal course caliber and contour. Left external carotid artery: Patent with antegrade flow. Left internal carotid artery: Normal course caliber and contour of the cervical portion. Vertical and petrous segment patent with normal course caliber contour. Cavernous segment patent. Clinoid segment patent. Antegrade flow of the ophthalmic artery. Ophthalmic segment patent. Terminus patent. Left MCA:  Proximal M1 segment patent Early temporal branch, supplying the left temporal lobe. Early angiographic image demonstrates significant leptomeningeal collateral flow into the MCA territory from the [facility] territory and the patent temporal branches. Distal M1 occlusion. Left ACA: A 1 segment patent. A 2 segment perfuses the right territory. Patent anterior communicating artery. Leptomeningeal collaterals from the [place] territory through the watershed ANCA territory. Completion Findings: Left MCA: Restoration of fluoro through the MCA after mechanical thrombectomy. TICI 3 flow restored Flat panel CT demonstrates no subarachnoid hemorrhage. PROCEDURE: Patient is brought emergently to the neuro angiography suite, with the patient identified appropriately and placed supine position on the table. Left radial arterial line was placed by the anesthesia team. The patient is then prepped and draped in the usual sterile fashion. Ultrasound survey of the right inguinal region was performed with images stored and sent to PACs. 11 blade scalpel was used to make a small incision. Blunt dissection was performed. A  micropuncture needle was used access the right common femoral artery under ultrasound. With excellent arterial blood flow returned, an .018 micro wire was passed through the needle, observed to enter the abdominal aorta under fluoroscopy. The needle was removed, and a micropuncture sheath was placed over the wire. The inner dilator and wire were removed, and an 035 Bentson wire was advanced under fluoroscopy into the abdominal aorta. The sheath was removed and a standard 5 [place] vascular sheath was placed. The dilator was removed and the sheath was flushed. A 21F JB-1 diagnostic catheter was advanced over the wire to the proximal descending thoracic aorta. Wire was then removed. Double flush of the catheter was performed. Catheter was then used to select the left common carotid artery. Formal angiogram was performed demonstrating somewhat tortuous common carotid artery. Standard Glidewire and the JB 1 catheter were used to navigate to the bifurcation. Wire was removed and angiogram was performed.  The roadrunner wire was then used to navigate the JB 1 catheter to the petrous segment. Wire was removed. Exchange length Rosen wire was then passed through the diagnostic catheter to the petrous ICA and the diagnostic catheter was removed. The 5 French sheath was removed and exchanged for 8 French 55 centimeter BrightTip sheath. Sheath was flushed and attached to pressurized and heparinized saline bag for constant forward flow. At this point we attempted to pass the 95 cm Wal[place] balloon guide catheter through the bright tip sheath. Multiple attempts using the introducer and without the introducer failed, and we ultimately decided that there was a mismatch, perhaps from quality control of the sheath. We then elected to use a flow gait balloon catheter. Eight French 95 cm flow gate balloon catheter was then advanced over the wire to the distal cervical segment. Wire was removed. Then a coaxial intermediate catheter and  microcatheter combination was prepared on the back table. This combination was CAT-5 catheter and a Trevo TRK microcatheter, with a synchro soft wire. This combination was then advanced through the balloon guide into the ICA. System was advanced into the internal carotid artery, to the level of the occlusion. The micro wire was then carefully advanced through the occluded segment. Microcatheter was then pushed through the occluded segment and the wire was removed. Intermediate catheter was advanced to the distal balloon guide. Blood was then aspirated through the hub of the microcatheter, and a gentle contrast injection was performed confirming intraluminal position. A rotating hemostatic valve was then attached to the back end of the microcatheter, and a pressurized and heparinized saline bag was attached to the catheter. 58m x 310mTrevo NXT device was then selected. Back flush was achieved at the rotating hemostatic valve, and then the device was gently advanced through the microcatheter to the distal end. The retriever was then unsheathed by withdrawing the microcatheter under fluoroscopy. Once the retriever was completely unsheathed, the microcatheter was carefully stripped from the delivery wire of the device. 3 minute time interval was observed. The balloon on the balloon guide was then inflated to profile of the vessel. Constant suction aspiration was then performed through the intermediate catheter, and constant gentle aspiration was performed at the balloon guide. This aspiration was continued as the retriever was gently and slowly withdrawn with fluoroscopic observation into the distal intermediate catheter. The entire system was then gently withdrawn from the intracranial ICA and into the balloon guide. Once the retriever was entirely removed from the system, free aspiration was confirmed at the hub of the balloon guide, with free blood return confirmed. Control angiogram was then performed, confirming  restoration of flow. Balloon guide was withdrawn with angiogram performed of the cervical ICA. Balloon guide was removed, and the bright tip sheath was removed and a wire with Angio-Seal deployed. Pulses were confirmed bilateral lower extremity. Patient tolerated the procedure well and remained hemodynamically stable throughout. No complications were encountered. Estimated blood loss approximately 75 cc. IMPRESSION: Status post ultrasound guided access right common femoral artery for left-sided cervical and cerebral angiogram and mechanical thrombectomy of left M1 occlusion via single pass of Trevo 3x32 NXT device. Angio-Seal deployed for hemostasis. Signed, JaDulcy FannyWaDellia NimsRPVI [facility] PLAN: Admit to ICU Patient was extubated in the room Blood pressure target of 16956ystolic Right hip straight times 8 hours, status post Angio-Seal deployment. Electronically Signed   By: JaCorrie Mckusick.O.   On: 03/04/2019 01:25   CT HEAD  CODE STROKE WO CONTRAST`  Result Date: 03/03/2019 CLINICAL DATA:  Code stroke. Initial evaluation for acute stroke, facial droop. EXAM: CT HEAD WITHOUT CONTRAST TECHNIQUE: Contiguous axial images were obtained from the base of the skull through the vertex without intravenous contrast. COMPARISON:  None available. FINDINGS: Brain: Generalized age-related cerebral atrophy with moderate chronic microvascular ischemic disease. No acute intracranial hemorrhage. Subtle hypodensity seen involving the left caudate, concerning for possible evolving left MCA territory infarct. Gray-white matter differentiation otherwise grossly maintained without definite discernible evidence for evolving ischemia. No mass lesion or midline shift. No hydrocephalus. No extra-axial fluid collection. Vascular: Apparent hyperdensity noted involving the distal left M1 and/or proximal M2 branch (a series 6, image 45), concerning for possible LVO. Calcified  atherosclerosis present at the skull base. Skull: Scalp soft tissues and calvarium within normal limits. Sinuses/Orbits: Globes and orbital soft tissues within normal limits. Sequelae of prior sinus surgery noted. Chronic mucosal thickening noted within the maxillary sinuses, ethmoidal air cells, and right sphenoid sinus. Mastoid air cells are clear. Other: None. ASPECTS [facility] Stroke Program Early CT Score) - Ganglionic level infarction (caudate, lentiform nuclei, internal capsule, insula, M1-M3 cortex): 6 - Supraganglionic infarction (M4-M6 cortex): 3 Total score (0-10 with 10 being normal): 9 IMPRESSION: 1. Asymmetric hyperdensity involving the distal left M1 and/or proximal M2 branch, concerning for LVO. Subtle hypodensity involving the left caudate concerning for evolving left MCA territory ischemia. No intracranial hemorrhage. 2. ASPECTS is 9. 3. Underlying age-related cerebral atrophy with moderate chronic small vessel ischemic disease. These results were communicated to Dr. Cheral Marker At 10:58 pmon 1/2/2021by text page via the [facility] messaging system. Electronically Signed   By: Jeannine Boga M.D.   On: 03/03/2019 23:02    Labs:  CBC: Recent Labs    03/03/19 2237 03/03/19 2237 03/03/19 2247 03/04/19 1537 03/06/19 0301 03/06/19 2338  WBC 16.6*  --   --  14.8* 12.0* 13.0*  HGB 13.8   < > 15.0 12.8* 11.4* 11.8*  HCT 41.3   < > 44.0 37.6* 34.4* 35.7*  PLT 677*  --   --  591* 481* 491*   < > = values in this interval not displayed.    COAGS: Recent Labs    03/03/19 2237  INR 1.1  APTT 38*    BMP: Recent Labs    03/03/19 0339 03/03/19 0339 03/03/19 2237 03/03/19 2247 03/04/19 1537 03/06/19 2338  NA 132*   < > 131* 131* 135 130*  K 3.8   < > 3.8 3.8 3.9 3.9  CL 96*   < > 97* 95* 102 94*  CO2 30  --  25  --  26 29  GLUCOSE 139*   < > 167* 159* 148* 127*  BUN 14   < > 19 23 15 19   CALCIUM 8.6*  --  9.2  --  8.4* 8.4*  CREATININE 0.78   < > 1.17 1.10 0.93 1.07    GFRNONAA >60  --  54*  --  >60 >60  GFRAA >60  --  >60  --  >60 >60   < > = values in this interval not displayed.    LIVER FUNCTION TESTS: Recent Labs    09/28/18 1344 11/27/18 1339 03/03/19 2237 03/04/19 1537  BILITOT 0.8 1.2 1.0 0.9  AST 18 16 21 21   ALT 11 14 17 18   ALKPHOS 79 60 58 59  PROT 6.7 6.7 6.1* 5.3*  ALBUMIN 4.6 4.3 3.5 3.0*    TUMOR MARKERS: No results  for input(s): AFPTM, CEA, CA199, CHROMGRNA in the last 8760 hours.  Assessment and Plan:  86 yo male with history of left MCA stroke, treated with mechanical thrombectomy via right CFA access.    His right CFA pseudoaneurysm persists, with a narrow neck and minimal flow into the mostly thrombosed sac.  We discussed options, and I think the best course of action is to attempt a compression treatment with the Vascular Lab.    He has done well in his early recovery from the left MCA thrombectomy.  He and his family seem quite satisfied with how he has done.  I have encouraged him to observe his PT/OT appointments and his neurology follow up.   We discussed his lower extremity swelling.  I think that his current immobility and gravity dependent positioning of his legs day to day is a major contributing factor.  In the absence of warmth, pain, and asymmetry, I think DVT is very unlikely.  I have encouraged him to position his legs in a non-dependent position when he is resting, as well as to use knee-high compression stockings, and to try calf-pumping/calf-raises when he can.   He has follow up with cardiology this afternoon.   He has follow up with [facility], but not yet scheduled.  He and his wife understand our plan  Plan: - US guided compression of small persisting right CFA pseudoaneurysm at the Vascular lab.  If it persists after attempt, then our plan will be for conservative observation with follow up in 2 weeks with repeat duplex with VIR clinic - I have encouraged him to observe his other  physician follow up.     Electronically Signed: Corrie Mckusick 03/20/2019, 12:39 PM   I spent a total of  40 Minutes   in face to face in clinical consultation, greater than 50% of which was counseling/coordinating care for cerebral angio for treatment of left ELVO, SP mechanical thrombectomy complicated by small right CFA pseudoaneurysm

## 2019-03-21 ENCOUNTER — Telehealth: Payer: Self-pay | Admitting: Internal Medicine

## 2019-03-21 ENCOUNTER — Other Ambulatory Visit: Payer: Self-pay

## 2019-03-21 ENCOUNTER — Ambulatory Visit (HOSPITAL_COMMUNITY)
Admission: RE | Admit: 2019-03-21 | Discharge: 2019-03-21 | Disposition: A | Discharge status: Home / Self Care | Payer: PPO | Source: Ambulatory Visit | Attending: Interventional Radiology | Admitting: Interventional Radiology

## 2019-03-21 DIAGNOSIS — J84112 Idiopathic pulmonary fibrosis: Secondary | ICD-10-CM | POA: Diagnosis present

## 2019-03-21 NOTE — Telephone Encounter (Signed)
Colette can you please call his spouse with his new appointment. Let her know Dr B is giving him time for rehab. Thanks.

## 2019-03-21 NOTE — Telephone Encounter (Signed)
Jimmy Jimenez with [facility] called to let Dr. Derrel Nip know there was a missed PT appt for today. Pt had another appt

## 2019-03-21 NOTE — Telephone Encounter (Signed)
Per Dr. Jacinto Reap  - hold off on virtual visit at this time.  R/s pt's apt out by 2 months

## 2019-03-21 NOTE — Telephone Encounter (Signed)
FYI

## 2019-03-21 NOTE — Discharge Instructions (Signed)
Pseudoaneurysm ° °An aneurysm is a bulge in an artery. A pseudoaneurysm happens when an artery is injured and blood leaks out and forms a sac-like bulge in the surrounding tissues. °What are the causes? °The most common cause of this condition is a procedure called an angiogram. During this procedure, a small, thin tube (catheter) is inserted into an artery. After an angiogram, the insertion site on the artery should close back up all the way. If it does not, blood may leak out of the artery. °Other causes of a pseudoaneurysm include: °· Trauma to the walls of an artery, such as from a stabbing injury or a deep cut. °· Bypass artery grafting surgery, which is a type of surgery that makes blood flow to the heart better. °· An infection that affects the walls of an artery. °· A heart attack (myocardial infarction). °What are the signs or symptoms? °Symptoms of this condition include: °· Pain, soreness, or tenderness at the site of the pseudoaneurysm. °· Swelling. °· Bruising or a change in skin color. °· A throbbing mass or lump at the site. °How is this diagnosed? °This condition may be diagnosed based on: °· Your symptoms. °· A physical exam. °· An imaging test called a Doppler ultrasound. This imaging test uses sound waves to show the blood flow in the arteries and the pseudoaneurysm. °How is this treated? °This condition may go away on its own without treatment. To help prevent bleeding that cannot be controlled, or to help prevent other problems, your health care provider may suggest one of these treatments: °· Injecting a blood-clotting enzyme, such as thrombin, into the site. °· Fixing the artery with surgery. °· Putting pressure (compression) on the pseudoaneurysm. °Follow these instructions at home: °· Take over-the-counter and prescription medicines only as told by your health care provider. °· Return to your normal activities as told by your health care provider. Ask your health care provider what  activities are safe for you. °· Keep all follow-up visits as told by your health care provider. This is important. °Contact a health care provider if: °· Your pain, soreness, or tenderness at the pseudoaneurysm site keeps getting worse. °· You have swelling at the site. °Get help right away if: °· You have severe or ongoing (persistent) pain at the site of the pseudoaneurysm. °· There is bleeding or drainage from the site. °· The part of your body where the pseudoaneurysm is located changes color or becomes painful, cold, or numb. °· You have chest pain or shortness of breath. °· You feel like you might faint or you faint. °Summary °· A pseudoaneurysm happens when an artery is injured and blood leaks out to form a sac-like bulge. °· The most common cause of this condition is a procedure called an angiogram in which a thin tube (catheter) is inserted into an artery. °· This condition may go away on its own without treatment. °· Take over-the-counter and prescription medicines only as told by your health care provider. °· Get help right away if the part of your body where the pseudoaneurysm is located changes color or becomes painful, cold, or numb. °This information is not intended to replace advice given to you by your health care provider. Make sure you discuss any questions you have with your health care provider. °Document Revised: 11/23/2017 Document Reviewed: 11/23/2017 °Elsevier Patient Education © 2020 Elsevier Inc. ° °

## 2019-03-22 ENCOUNTER — Telehealth: Payer: Self-pay

## 2019-03-22 ENCOUNTER — Telehealth: Payer: Self-pay | Admitting: Internal Medicine

## 2019-03-22 ENCOUNTER — Encounter: Payer: Self-pay | Admitting: Internal Medicine

## 2019-03-22 ENCOUNTER — Other Ambulatory Visit: Payer: Self-pay | Admitting: Interventional Radiology

## 2019-03-22 ENCOUNTER — Other Ambulatory Visit: Payer: Self-pay

## 2019-03-22 ENCOUNTER — Ambulatory Visit (INDEPENDENT_AMBULATORY_CARE_PROVIDER_SITE_OTHER): Payer: PPO | Admitting: Internal Medicine

## 2019-03-22 ENCOUNTER — Other Ambulatory Visit: Payer: Self-pay | Admitting: *Deleted

## 2019-03-22 VITALS — BP 150/62 | HR 86 | Temp 98.0°F | Wt 144.0 lb

## 2019-03-22 DIAGNOSIS — R944 Abnormal results of kidney function studies: Secondary | ICD-10-CM

## 2019-03-22 DIAGNOSIS — I10 Essential (primary) hypertension: Secondary | ICD-10-CM

## 2019-03-22 DIAGNOSIS — I729 Aneurysm of unspecified site: Secondary | ICD-10-CM

## 2019-03-22 DIAGNOSIS — I63412 Cerebral infarction due to embolism of left middle cerebral artery: Secondary | ICD-10-CM | POA: Diagnosis not present

## 2019-03-22 DIAGNOSIS — R600 Localized edema: Secondary | ICD-10-CM

## 2019-03-22 LAB — COMPREHENSIVE METABOLIC PANEL
ALT: 16 U/L (ref 0–53)
AST: 19 U/L (ref 0–37)
Albumin: 3.9 g/dL (ref 3.5–5.2)
Alkaline Phosphatase: 70 U/L (ref 39–117)
BUN: 13 mg/dL (ref 6–23)
CO2: 33 mEq/L — ABNORMAL HIGH (ref 19–32)
Calcium: 9.2 mg/dL (ref 8.4–10.5)
Chloride: 94 mEq/L — ABNORMAL LOW (ref 96–112)
Creatinine, Ser: 1.01 mg/dL (ref 0.40–1.50)
GFR: 83.59 mL/min (ref >60.00)
Glucose, Bld: 140 mg/dL — ABNORMAL HIGH (ref 70–99)
Potassium: 3.6 mEq/L (ref 3.5–5.1)
Sodium: 132 mEq/L — ABNORMAL LOW (ref 135–145)
Total Bilirubin: 0.7 mg/dL (ref 0.2–1.2)
Total Protein: 6.3 g/dL (ref 6.0–8.3)

## 2019-03-22 MED ORDER — LISINOPRIL 40 MG PO TABS: 40.0000 mg | ORAL_TABLET | Freq: Every day | ORAL | 0 refills | Status: DC

## 2019-03-22 MED ORDER — FUROSEMIDE 20 MG PO TABS: 20.0000 mg | ORAL_TABLET | Freq: Every day | ORAL | 1 refills | Status: DC

## 2019-03-22 NOTE — Telephone Encounter (Signed)
LM asking patient to call back. Since patient was taking every other day Dr. Janne Lab sent in for him to take Lasix everyday.

## 2019-03-22 NOTE — Telephone Encounter (Signed)
Pt's wife returning a call about medication -lasix

## 2019-03-22 NOTE — Telephone Encounter (Signed)
FYI but patient does have appointment with you today.

## 2019-03-22 NOTE — Telephone Encounter (Signed)
I called and spoke with [facility] & they did receive correct new dose of lasix 20 mg daily. They are getting filled for patient & will deliver.

## 2019-03-22 NOTE — Progress Notes (Signed)
 Subjective:  Patient ID: Jimmy Jimenez, male    DOB: 03/13/1925  Age: 86 y.o. MRN: [id number]  CC: There were no encounter diagnoses.  HPI Jimmy Jimenez presents for follow up on recent CVA with resolving deficits.  This visit occurred during the SARS-CoV-2 public health emergency.  Safety protocols were in place, including screening questions prior to the visit, additional usage of staff PPE, and extensive cleaning of exam room while observing appropriate contact time as indicated for disinfecting solutions.   HTN: home readings reviewed since discharge on reduced regimen.  Readings have been 120's/60-75 today  And yesterday, but are elevated here in the office .  He has not taken hydralazine in at least 3 days since home readings have been lower . He has developed significant ankle and pedal edema since hospitalization   Physical therapy has been ordered for post CVA weakness   Outpatient Medications Prior to Visit  Medication Sig Dispense Refill  . albuterol (VENTOLIN HFA) 108 (90 Base) MCG/ACT inhaler Inhale 2 puffs into the lungs every 6 (six) hours as needed for wheezing or shortness of breath. 18 g 0  . amLODipine (NORVASC) 5 MG tablet TAKE 1 TABLET BY MOUTH ONCE DAILY 90 tablet 1  . aspirin EC 81 MG tablet Take 1 tablet (81 mg total) by mouth daily for 21 days. Reported on 08/14/2015 21 tablet 0  . atorvastatin (LIPITOR) 10 MG tablet Take 1 tablet (10 mg total) by mouth daily at 6 PM. 30 tablet 2  . azelastine (ASTELIN) 0.1 % nasal spray Place 2 sprays into both nostrils 2 (two) times daily.     . benzonatate (TESSALON) 200 MG capsule Take 1 capsule (200 mg total) by mouth 2 (two) times daily as needed for cough. 20 capsule 0  . Calcium Citrate-Vitamin D (CITRACAL + D PO) Take 2 tablets by mouth daily. Take 2 by mouth daily, calcium 630 and vitamin D 500 each.     . Cholecalciferol (VITAMIN D3) 10 MCG (400 UNIT) CAPS Take 500 Units by mouth daily. Take 2 by mouth daily     .  clopidogrel (PLAVIX) 75 MG tablet Take 1 tablet (75 mg total) by mouth daily. 30 tablet 2  . clotrimazole-betamethasone (LOTRISONE) cream Apply topically 2 (two) times daily. (Patient not taking: Reported on 03/19/2019) 90 g 5  . Cyanocobalamin (B-12 COMPLIANCE INJECTION) 1000 MCG/ML KIT Inject 1,000 mcg as directed every 30 (thirty) days.     . cyanocobalamin 1000 MCG tablet Take 5,000 mcg by mouth daily.    . docusate sodium (COLACE) 100 MG capsule Take 100 mg by mouth 2 (two) times daily as needed for mild constipation.    . esomeprazole (NEXIUM) 40 MG capsule TAKE 1 CAPSULE BY MOUTH ONCE DAILY AT NOON 90 capsule 3  . feeding supplement, ENSURE ENLIVE, (ENSURE ENLIVE) LIQD Take 237 mLs by mouth 2 (two) times daily between meals. 237 mL 12  . fluticasone (FLONASE) 50 MCG/ACT nasal spray Use 1-2 sprays in each nostril daily as needed    . furosemide (LASIX) 20 MG tablet TAKE 1 TABLET BY MOUTH EVERY OTHER DAY IN MORNING (Patient taking differently: Take 20 mg by mouth every other day. TAKE 1 TABLET BY MOUTH EVERY OTHER DAY IN MORNING) 45 tablet 2  . hydrALAZINE (APRESOLINE) 50 MG tablet Take 50 mg by mouth 3 (three) times daily.    . hydroxyurea (HYDREA) 500 MG capsule TAKE 1 CAPSULE BY MOUTH EVERY OTHER DAY MAY TAKE WITH FOOD TO MINIMIZE GI   SIDE EFFECTS. 60 capsule 3  . Infant Care Products (DERMACLOUD) CREA Apply 1 application topically as needed (after each bowel movement for barrier for  pressure ulcer.). 430 g 2  . lisinopril (ZESTRIL) 40 MG tablet Take 1 tablet by mouth daily.    [name] testosterone cypionate (DEPOTESTOSTERONE CYPIONATE) 200 MG/ML injection INJECT 0.25 ML INTRAMUSCULARLY EVERY 14 DAYS AS DIRECTED 10 mL 1   No facility-administered medications prior to visit.    Review of Systems;  Patient denies headache, fevers, malaise, unintentional weight loss, skin rash, eye pain, sinus congestion and sinus pain, sore throat, dysphagia,  hemoptysis , cough, dyspnea, wheezing, chest pain,  palpitations, orthopnea,  abdominal pain, nausea, melena, diarrhea, constipation, flank pain, dysuria, hematuria, urinary  Frequency, nocturia, numbness, tingling, seizures,  Focal weakness, Loss of consciousness,  Tremor, insomnia, depression, anxiety, and suicidal ideation.      Objective:  BP (!) 150/62   Pulse 86   Temp 98 F (36.7 C) (Temporal)   Wt 144 lb (65.3 kg)   SpO2 96%   BMI 21.90 kg/m   BP Readings from Last 3 Encounters:  03/22/19 (!) 150/62  03/21/19 (!) 157/49  03/20/19 (!) 150/61    Wt Readings from Last 3 Encounters:  03/22/19 144 lb (65.3 kg)  03/21/19 145 lb (65.8 kg)  03/09/19 138 lb (62.6 kg)    General appearance: alert, cooperative and appears stated age Ears: normal TM's and external ear canals both ears Throat: lips, mucosa, and tongue normal; teeth and gums normal Neck: no adenopathy, no carotid bruit, supple, symmetrical, trachea midline and thyroid not enlarged, symmetric, no tenderness/mass/nodules Back: symmetric, no curvature. ROM normal. No CVA tenderness. Lungs: clear to auscultation bilaterally Heart: regular rate and rhythm, S1, S2 normal, no murmur, click, rub or gallop Abdomen: soft, non-tender; bowel sounds normal; no masses,  no organomegaly Pulses: 2+ and symmetric Skin: Skin color, texture, turgor normal. No rashes or lesions Lymph nodes: Cervical, supraclavicular, and axillary nodes normal. Ext:  Bilateral 2 + pedal/ankle edema   Lab Results  Component Value Date   HGBA1C 4.9 03/04/2019   HGBA1C 4.8 03/03/2019    Lab Results  Component Value Date   CREATININE 1.07 03/06/2019   CREATININE 0.93 03/04/2019   CREATININE 1.10 03/03/2019    Lab Results  Component Value Date   WBC 13.0 (H) 03/06/2019   HGB 11.8 (L) 03/06/2019   HCT 35.7 (L) 03/06/2019   PLT 491 (H) 03/06/2019   GLUCOSE 127 (H) 03/06/2019   CHOL 158 03/04/2019   TRIG 64 03/04/2019   HDL 58 03/04/2019   LDLDIRECT 103.7 09/26/2013   LDLCALC 87 03/04/2019    ALT 18 03/04/2019   AST 21 03/04/2019   NA 130 (L) 03/06/2019   K 3.9 03/06/2019   CL 94 (L) 03/06/2019   CREATININE 1.07 03/06/2019   BUN 19 03/06/2019   CO2 29 03/06/2019   TSH 1.80 09/28/2018   PSA 0.88 08/19/2014   INR 1.1 03/03/2019   HGBA1C 4.9 03/04/2019   MICROALBUR 5.2 (H) 09/28/2018    VAS [place] LOWER EXT ARTERIAL PSEUDOANEURYSM COMPRESSION  Result Date: 03/21/2019  ARTERIAL PSEUDOANEURYSM  Exam: Right groin Indications: Patient complains of persisting right CFA pseduoaneurysm. History: History of small right CFA pseudoaneurysm that was treated on 03/07/19 with thrombin injection, and there was near complete thrombosed. Duplex on 1/7 showed a small residual active neck with a 40m x 268mpseudo. Comparison Study: pervious study done 03/08/19 Performing Technologist: MeAbram SanderVS Supporting Technologist: RiOda Cogan  RDMS, RVT  Examination Guidelines: A complete evaluation includes B-mode imaging, spectral Doppler, color Doppler, and power Doppler as needed of all accessible portions of each vessel. Bilateral testing is considered an integral part of a complete examination. Limited examinations for reoccurring indications may be performed as noted. +------------+----------+---------+------+----------+ Right DuplexPSV (cm/s)Waveform PlaqueComment(s) +------------+----------+---------+------+----------+ CFA            124    triphasic                 +------------+----------+---------+------+----------+  Summary: Right groin compression was performed with 20 minutes of holding compression total. A very short, small active residual neck still remains with no active pseudoaneurysm. Diagnosing physician: Curt Jews MD Electronically signed by Curt Jews MD on 03/21/2019 at 3:04:20 PM.   --------------------------------------------------------------------------------    Final     Assessment & Plan:   Problem List Items Addressed This Visit    None      I am having Jimmy Jimenez maintain his Calcium Citrate-Vitamin D (CITRACAL + D PO), Vitamin D3, docusate sodium, B-12 Compliance Injection, hydroxyurea, furosemide, esomeprazole, clotrimazole-betamethasone, amLODipine, benzonatate, Dermacloud, testosterone cypionate, azelastine, cyanocobalamin, albuterol, aspirin EC, clopidogrel, atorvastatin, feeding supplement (ENSURE ENLIVE), fluticasone, lisinopril, and hydrALAZINE.  No orders of the defined types were placed in this encounter.   There are no discontinued medications.  Follow-up: No follow-ups on file.   Crecencio Mc, MD

## 2019-03-22 NOTE — Telephone Encounter (Signed)
I spoke with patient's wife & she stated that the only prescription they received was lisinopril. I called [facility] to make sure they electronically got script for lasix 20 mg daily & they had. They will fill & deliver to patient.

## 2019-03-22 NOTE — Telephone Encounter (Signed)
Pt wife called about the medication of furosemide (LASIX) 20 MG tablet Pharmacy did not give extra pills for the  Increase.  Call pt @ [phone]

## 2019-03-22 NOTE — Patient Instructions (Addendum)
Increase the furosemide to once daily in the morning   Stop the amlodipine because it contributing  to the swelling  Starting taking lisinopril instead ,  At bedtime 40 mg    Do not take hydralazine unless blood pressure stays above 140   For more thant 48 hours   Elevate your legs when sitting !    Stop the aspirin after January 31  And continue the clopidogrel (Plavix)   I recommend getting the COVID 19 VACCINE  After he has stopped the aspirin (2nd week in February)   You can do this at the [facility] health department

## 2019-03-22 NOTE — Patient Outreach (Signed)
  [place] [facility]) Care Management  03/22/2019  Jimmy Jimenez 03/13/1925 [id number]  Follow up outreach to referred EMMI stroke patient and case closure    Jimmy Jimenez had 2 EMMI stroke red alerts within 7 days (on 03/13/19 and 03/18/19) With contact from [facility] RN CMs, these automated documented answers were all found to be incorrect. He and his wife denied him feeling worse, problems setting up rehab nor him smoking or being around smoke.  On the initial contact, it was identified the assistance with pill packing was a need  [facility] pharmacy at the embedded [place] office is  is assisting   On today 03/22/19 Jimmy Jimenez is able to verify HIPAA correctly  She reports Jimmy Jimenez is doing better  She reports he had several MD office visits this week that went well He will be seen on 03/23/19 by [facility] HH PT, Sela Hua She does report some swelling of Jimmy Jimenez's feet but informs [facility] RN CM that Dr Derrel Nip addressed the swelling during the MD visit today 03/22/19 She increased his lasix to once daily in the morning, stopped the amlodipine, started lisinopril 40 mg at bedtime and encouraged elevation of his legs/feet  She denies other needs at this time   Social: Jimmy Jimenez is a 86 year old retired male who lives at home with his wife, Jimmy Jimenez. They report wonderful support from their son and his family. Jimmy Jimenez is able to feed himself but Jimmy Difranco report since his hospitalization she has assisted with his medication administration, bathing and dressing. 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 Chronic Diastolic congestive Heart Failure (CHF), pulmonary HTN, bradycardia, pulmonary fibrosis, chronic rhinitis, GERD (gastroesophageal reflux disease)osteoporosis, stage II sacral decubitus, essential  thrombocytosis, Hyperlipidemia (HLD). Edema in both legs, B12 deficiency, mass of pancreas, chest pain, acute blood loss anemia, mild malnutrition   DME: eyeglasses   Appointments  03/29/19 follow up at primary care provider (PCP) 04/17/19 radiology Dr Estevan Oaks  04/18/19 follow up at neurology   Plans  [facility] RN CM will close case at this time as patient has been assessed and no needs identified/needs resolved.   Pt encouraged to return a call to [facility] RN CM prn   Nicki Furlan L. Lavina Hamman, RN, BSN, [place] Coordinator Office number [phone] Mobile number [phone]  Main [facility] number [phone] Fax number [phone]

## 2019-03-22 NOTE — Telephone Encounter (Signed)
Di Kindle from [facility] called to inform Dr Derrel Nip that it will be at least a week before they can start speech therapy.

## 2019-03-23 ENCOUNTER — Other Ambulatory Visit: Payer: Self-pay | Admitting: *Deleted

## 2019-03-23 NOTE — Patient Outreach (Signed)
[facility]) Care Management  03/23/2019  Jimmy Jimenez 03/13/1925 [id number]   [place] coordination-   On 03/22/19 discussed pt as a Health team advantage (HTA) patient for follow up care with his wife [facility] RN CM sent [name] information to HTA UM after completion of EMMI stroke follow up on 03/22/19  Joelene Millin L. Lavina Hamman, RN, BSN, [place] Coordinator Office number [phone] Mobile number [phone]  Main [facility] number [phone] Fax number [phone]

## 2019-03-23 NOTE — Progress Notes (Addendum)
Nurse gave injection due to patient request into right upper quadrant patient voiced no concerns or complaint at injection. Medication supplied by patient , injection given by me and ordered in Epic by [place].

## 2019-03-23 NOTE — Telephone Encounter (Signed)
Call tto notify of wound check appointment.

## 2019-03-25 NOTE — Assessment & Plan Note (Addendum)
Presenting as a CODE STROKE with aphasia and right sided weakness.  Source of embolism not found during hospitalization,  And  LOOP recorder was placed for arrhythmia monitoring.  Given the question of a pancreatic malignancy, the question of whether the anticoagulant should  be changed to warfarin remains/  He is currently taking plavix and asa until end of January at which point asa will be stopped per neurology . PT/OT has been ordered for home therapy

## 2019-03-25 NOTE — Assessment & Plan Note (Signed)
Changing regimen due to edema. wth amlodipine.  Lisinopril resumed.

## 2019-03-25 NOTE — Assessment & Plan Note (Signed)
Aggravated by antihypertensive regimen.  Stopping amlodipine,  Starting lisinopril.  Furosemide dose increased from qod to qd

## 2019-03-26 ENCOUNTER — Inpatient Hospital Stay: Payer: PPO | Admitting: Internal Medicine

## 2019-03-26 ENCOUNTER — Telehealth: Payer: Self-pay

## 2019-03-26 ENCOUNTER — Inpatient Hospital Stay: Payer: PPO

## 2019-03-26 NOTE — Telephone Encounter (Signed)
She needs to give the lisinopril more time.  The change was only made on the 24th and the numbers are not alarming.  Give the change one week with the lisinopril

## 2019-03-26 NOTE — Telephone Encounter (Signed)
Pt's wife called concerned about pt's blood pressure. She stated that is is fluctuating.   03/24/2019     1:52pm: 149/66                              115/69 at bedtime 03/25/2019     154/61                      147/70                      4:16pm: 144/66                      136/68 at bedtime  03/26/2019     7:25am: 135/74                      1:47pm: 167/77  Pt would like to know what she needs to do about the pt's blood pressure.

## 2019-03-26 NOTE — Telephone Encounter (Signed)
Spoke with pt's wife and informed her that she needs to give the lisinopril at least a week to start working. Pt's wife gave a verbal understanding.

## 2019-03-27 ENCOUNTER — Other Ambulatory Visit: Payer: Self-pay | Admitting: *Deleted

## 2019-03-27 NOTE — Patient Outreach (Signed)
  [place] [facility]) Care Management  03/27/2019  G[place] Bech 03/13/1925 [id number]   EMMI- stroke  RED ON EMMI ALERT Day # 13 Date: Sunday 03/25/19 1300 Red Alert Reason: feeling worse overall? 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 [facility] RN CM left HIPAA compliant voicemail message along with CM's contact info.   Plan: [facility] RN CM scheduled this patient for another call attempt within 4 business days  Britiny Defrain L. Lavina Hamman, RN, BSN, [place] Coordinator Office number [phone] Mobile number [phone]  Main [facility] number [phone] Fax number [phone]

## 2019-03-28 ENCOUNTER — Other Ambulatory Visit: Payer: Self-pay | Admitting: *Deleted

## 2019-03-28 ENCOUNTER — Telehealth: Payer: Self-pay | Admitting: Internal Medicine

## 2019-03-28 NOTE — Telephone Encounter (Signed)
FYI

## 2019-03-28 NOTE — Patient Outreach (Addendum)
  [place] [facility]) Care Management  03/28/2019  Jimmy Jimenez 03/13/1925 [id number]   EMMI- stroke  RED ON EMMI ALERT Day # 13 Date: Sunday 03/25/19 1300 Red Alert Reason: feeling worse overall? 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 Phone rang and rang   Plan: [facility] RN CM sent an unsuccessful outreach letter and scheduled this patient for another call attempt within 4 business days  [name] L. Lavina Hamman, RN, BSN, [place] Coordinator Office number [phone] Mobile number [phone]  Main [facility] number [phone] Fax number [phone]

## 2019-03-28 NOTE — Telephone Encounter (Signed)
Colletta Maryland from [facility] called to let Dr. Derrel Nip know that pt's wife is refusing occupational therapy services.

## 2019-03-29 ENCOUNTER — Ambulatory Visit (INDEPENDENT_AMBULATORY_CARE_PROVIDER_SITE_OTHER): Payer: PPO

## 2019-03-29 ENCOUNTER — Other Ambulatory Visit: Payer: Self-pay

## 2019-03-29 ENCOUNTER — Ambulatory Visit: Payer: Self-pay | Admitting: Pharmacist

## 2019-03-29 DIAGNOSIS — E538 Deficiency of other specified B group vitamins: Secondary | ICD-10-CM | POA: Diagnosis not present

## 2019-03-29 DIAGNOSIS — R7989 Other specified abnormal findings of blood chemistry: Secondary | ICD-10-CM | POA: Diagnosis not present

## 2019-03-29 MED ORDER — TESTOSTERONE CYPIONATE 100 MG/ML IM SOLN
25.0000 mg | INTRAMUSCULAR | Status: DC
  Administered 2019-03-29: 25 mg via INTRAMUSCULAR

## 2019-03-29 MED ORDER — CYANOCOBALAMIN 1000 MCG/ML IJ SOLN
1000.0000 ug | Freq: Once | INTRAMUSCULAR | Status: AC
  Administered 2019-03-29: 1000 ug via INTRAMUSCULAR

## 2019-03-29 NOTE — Chronic Care Management (AMB) (Signed)
  Chronic Care Management   Note  03/29/2019 Name: Jimmy Jimenez MRN: [id number] DOB: 03/13/1925  Jimmy Jimenez is a 86 y.o. year old male who is a primary care patient of Derrel Nip, Aris Everts, MD. The CCM team was consulted for assistance with chronic disease management and care coordination needs.    Attempted to call patient/his wife to follow up on medication management needs and starting adherence packaging. Called number on file, but it rang continuously and I was unable to leave a message. I called using [employer] from [employer] number.   Follow up plan: - Will outreach as previously scheduled for medication management review.  Catie Darnelle Maffucci, PharmD, [place], CPP Clinical Pharmacist [place] [phone]

## 2019-03-29 NOTE — Progress Notes (Addendum)
Jimmy Jimenez presents today for injection per MD orders. B12 injection administered IM in left Upper Arm. Administration without incident. Patient tolerated well.  Nina,cma   Jimmy Jimenez presents today for injection per MD orders. Testosterone Injection  administered IM in left Gluteal. Administration without incident. Patient tolerated well.  Nina,cma   Reviewed  Dr Nicki Reaper

## 2019-03-30 ENCOUNTER — Telehealth: Payer: Self-pay | Admitting: Internal Medicine

## 2019-03-30 ENCOUNTER — Other Ambulatory Visit: Payer: Self-pay | Admitting: *Deleted

## 2019-03-30 DIAGNOSIS — I110 Hypertensive heart disease with heart failure: Secondary | ICD-10-CM | POA: Diagnosis not present

## 2019-03-30 DIAGNOSIS — J9611 Chronic respiratory failure with hypoxia: Secondary | ICD-10-CM

## 2019-03-30 DIAGNOSIS — Z7901 Long term (current) use of anticoagulants: Secondary | ICD-10-CM

## 2019-03-30 DIAGNOSIS — J8410 Pulmonary fibrosis, unspecified: Secondary | ICD-10-CM

## 2019-03-30 DIAGNOSIS — Z7951 Long term (current) use of inhaled steroids: Secondary | ICD-10-CM

## 2019-03-30 DIAGNOSIS — Z7982 Long term (current) use of aspirin: Secondary | ICD-10-CM

## 2019-03-30 DIAGNOSIS — M810 Age-related osteoporosis without current pathological fracture: Secondary | ICD-10-CM

## 2019-03-30 DIAGNOSIS — E538 Deficiency of other specified B group vitamins: Secondary | ICD-10-CM

## 2019-03-30 DIAGNOSIS — D62 Acute posthemorrhagic anemia: Secondary | ICD-10-CM

## 2019-03-30 DIAGNOSIS — I69392 Facial weakness following cerebral infarction: Secondary | ICD-10-CM

## 2019-03-30 DIAGNOSIS — I5032 Chronic diastolic (congestive) heart failure: Secondary | ICD-10-CM

## 2019-03-30 DIAGNOSIS — E785 Hyperlipidemia, unspecified: Secondary | ICD-10-CM

## 2019-03-30 DIAGNOSIS — I272 Pulmonary hypertension, unspecified: Secondary | ICD-10-CM

## 2019-03-30 DIAGNOSIS — I9789 Other postprocedural complications and disorders of the circulatory system, not elsewhere classified: Secondary | ICD-10-CM | POA: Diagnosis not present

## 2019-03-30 DIAGNOSIS — L89152 Pressure ulcer of sacral region, stage 2: Secondary | ICD-10-CM | POA: Diagnosis not present

## 2019-03-30 DIAGNOSIS — K219 Gastro-esophageal reflux disease without esophagitis: Secondary | ICD-10-CM

## 2019-03-30 DIAGNOSIS — J310 Chronic rhinitis: Secondary | ICD-10-CM

## 2019-03-30 DIAGNOSIS — K5904 Chronic idiopathic constipation: Secondary | ICD-10-CM

## 2019-03-30 DIAGNOSIS — E441 Mild protein-calorie malnutrition: Secondary | ICD-10-CM

## 2019-03-30 DIAGNOSIS — I69351 Hemiplegia and hemiparesis following cerebral infarction affecting right dominant side: Secondary | ICD-10-CM | POA: Diagnosis not present

## 2019-03-30 DIAGNOSIS — I69320 Aphasia following cerebral infarction: Secondary | ICD-10-CM

## 2019-03-30 NOTE — Telephone Encounter (Signed)
Pt Wife called wanting to know if there was anything that Dr. Derrel Nip could prescribe for him to gain weight

## 2019-03-30 NOTE — Patient Outreach (Signed)
[facility]) Care Management  03/30/2019  Jimmy Jimenez 03/13/1925 [id number]   EMMI- stroke completion  RED ON EMMI ALERT Day #13 Date:Sunday 03/25/19 1300 Red Alert Reason:feeling worse overall? 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 She states Jimmy Jimenez does not have any worsening issues. She reports he is doing well and continues to work with home health [facility]) Physical therapy (PT) but the home health staff wants a [facility] RN evaluation. Jimmy Jimenez states she and Jimmy Jimenez spoke and they do not want [facility] RN visits because of precautions related to the covid pandemic. [facility] RN CM discussed that each home health staff are to follow covid pandemic precautions and encouraged her to speak with the home health agency about this or possible telephonic assessment She voiced understanding  She denies any further medical issues that has not been addressed by Jimmy Jimenez medical providers Jimmy Jimenez was referred to her [employer] concierge about the continuance of home health PT services.  Jimmy Jimenez is aware that [employer] staff has been sent Jimmy Jimenez's information to continue to follow for other services other than EMMI  Plan: [facility] RN CM will close case at this time as patient has been assessed and no needs identified/needs resolved.   Pt encouraged to return a call to [facility] RN CM prn  Jimmy Minahan L. Lavina Hamman, RN, BSN, [place] Coordinator Office number [phone] Mobile number [phone]  Main [facility] number [phone] Fax  number [phone]

## 2019-03-31 ENCOUNTER — Emergency Department: Payer: No Typology Code available for payment source

## 2019-03-31 ENCOUNTER — Other Ambulatory Visit: Payer: Self-pay

## 2019-03-31 ENCOUNTER — Inpatient Hospital Stay
Admission: EM | Admit: 2019-03-31 | Discharge: 2019-04-02 | DRG: 872 | Disposition: A | Discharge status: Home / Self Care | Payer: No Typology Code available for payment source | Attending: Internal Medicine | Admitting: Internal Medicine

## 2019-03-31 DIAGNOSIS — A09 Infectious gastroenteritis and colitis, unspecified: Secondary | ICD-10-CM | POA: Diagnosis present

## 2019-03-31 DIAGNOSIS — D490 Neoplasm of unspecified behavior of digestive system: Secondary | ICD-10-CM | POA: Diagnosis not present

## 2019-03-31 DIAGNOSIS — D473 Essential (hemorrhagic) thrombocythemia: Secondary | ICD-10-CM | POA: Diagnosis present

## 2019-03-31 DIAGNOSIS — D136 Benign neoplasm of pancreas: Secondary | ICD-10-CM | POA: Diagnosis present

## 2019-03-31 DIAGNOSIS — Z800 Family history of malignant neoplasm of digestive organs: Secondary | ICD-10-CM | POA: Diagnosis not present

## 2019-03-31 DIAGNOSIS — I10 Essential (primary) hypertension: Secondary | ICD-10-CM | POA: Diagnosis not present

## 2019-03-31 DIAGNOSIS — E538 Deficiency of other specified B group vitamins: Secondary | ICD-10-CM | POA: Diagnosis present

## 2019-03-31 DIAGNOSIS — Z515 Encounter for palliative care: Secondary | ICD-10-CM | POA: Diagnosis not present

## 2019-03-31 DIAGNOSIS — Z79899 Other long term (current) drug therapy: Secondary | ICD-10-CM | POA: Diagnosis not present

## 2019-03-31 DIAGNOSIS — Z86010 Personal history of colonic polyps: Secondary | ICD-10-CM | POA: Diagnosis not present

## 2019-03-31 DIAGNOSIS — K862 Cyst of pancreas: Secondary | ICD-10-CM | POA: Diagnosis present

## 2019-03-31 DIAGNOSIS — Z880 Allergy status to penicillin: Secondary | ICD-10-CM

## 2019-03-31 DIAGNOSIS — Z20822 Contact with and (suspected) exposure to covid-19: Secondary | ICD-10-CM | POA: Diagnosis present

## 2019-03-31 DIAGNOSIS — Z7902 Long term (current) use of antithrombotics/antiplatelets: Secondary | ICD-10-CM

## 2019-03-31 DIAGNOSIS — I63419 Cerebral infarction due to embolism of unspecified middle cerebral artery: Secondary | ICD-10-CM | POA: Diagnosis not present

## 2019-03-31 DIAGNOSIS — A4150 Gram-negative sepsis, unspecified: Secondary | ICD-10-CM | POA: Diagnosis not present

## 2019-03-31 DIAGNOSIS — K529 Noninfective gastroenteritis and colitis, unspecified: Secondary | ICD-10-CM | POA: Diagnosis not present

## 2019-03-31 DIAGNOSIS — K921 Melena: Secondary | ICD-10-CM

## 2019-03-31 DIAGNOSIS — Z79890 Hormone replacement therapy (postmenopausal): Secondary | ICD-10-CM | POA: Diagnosis not present

## 2019-03-31 DIAGNOSIS — E878 Other disorders of electrolyte and fluid balance, not elsewhere classified: Secondary | ICD-10-CM | POA: Diagnosis present

## 2019-03-31 DIAGNOSIS — Z881 Allergy status to other antibiotic agents status: Secondary | ICD-10-CM

## 2019-03-31 DIAGNOSIS — M810 Age-related osteoporosis without current pathological fracture: Secondary | ICD-10-CM | POA: Diagnosis present

## 2019-03-31 DIAGNOSIS — Z8673 Personal history of transient ischemic attack (TIA), and cerebral infarction without residual deficits: Secondary | ICD-10-CM

## 2019-03-31 DIAGNOSIS — A419 Sepsis, unspecified organism: Secondary | ICD-10-CM | POA: Diagnosis present

## 2019-03-31 DIAGNOSIS — E876 Hypokalemia: Secondary | ICD-10-CM | POA: Diagnosis present

## 2019-03-31 DIAGNOSIS — Z833 Family history of diabetes mellitus: Secondary | ICD-10-CM | POA: Diagnosis not present

## 2019-03-31 DIAGNOSIS — Z8249 Family history of ischemic heart disease and other diseases of the circulatory system: Secondary | ICD-10-CM | POA: Diagnosis not present

## 2019-03-31 DIAGNOSIS — Z888 Allergy status to other drugs, medicaments and biological substances status: Secondary | ICD-10-CM

## 2019-03-31 DIAGNOSIS — E785 Hyperlipidemia, unspecified: Secondary | ICD-10-CM | POA: Diagnosis not present

## 2019-03-31 DIAGNOSIS — Z7189 Other specified counseling: Secondary | ICD-10-CM | POA: Diagnosis not present

## 2019-03-31 DIAGNOSIS — I639 Cerebral infarction, unspecified: Secondary | ICD-10-CM | POA: Diagnosis not present

## 2019-03-31 LAB — BASIC METABOLIC PANEL
Anion gap: 10 (ref 5–15)
BUN: 21 mg/dL (ref 8–23)
CO2: 32 mmol/L (ref 22–32)
Calcium: 9 mg/dL (ref 8.9–10.3)
Chloride: 95 mmol/L — ABNORMAL LOW (ref 98–111)
Creatinine, Ser: 1.04 mg/dL (ref 0.61–1.24)
GFR calc Af Amer: >60 mL/min (ref >60)
GFR calc non Af Amer: >60 mL/min (ref >60)
Glucose, Bld: 135 mg/dL — ABNORMAL HIGH (ref 70–99)
Potassium: 4 mmol/L (ref 3.5–5.1)
Sodium: 137 mmol/L (ref 135–145)

## 2019-03-31 LAB — COMPREHENSIVE METABOLIC PANEL
ALT: 19 U/L (ref 0–44)
AST: 25 U/L (ref 15–41)
Albumin: 4.1 g/dL (ref 3.5–5.0)
Alkaline Phosphatase: 86 U/L (ref 38–126)
Anion gap: 13 (ref 5–15)
BUN: 23 mg/dL (ref 8–23)
CO2: 31 mmol/L (ref 22–32)
Calcium: 9.6 mg/dL (ref 8.9–10.3)
Chloride: 93 mmol/L — ABNORMAL LOW (ref 98–111)
Creatinine, Ser: 1.14 mg/dL (ref 0.61–1.24)
GFR calc Af Amer: >60 mL/min (ref >60)
GFR calc non Af Amer: 56 mL/min — ABNORMAL LOW (ref >60)
Glucose, Bld: 200 mg/dL — ABNORMAL HIGH (ref 70–99)
Potassium: 3.3 mmol/L — ABNORMAL LOW (ref 3.5–5.1)
Sodium: 137 mmol/L (ref 135–145)
Total Bilirubin: 1.3 mg/dL — ABNORMAL HIGH (ref 0.3–1.2)
Total Protein: 7 g/dL (ref 6.5–8.1)

## 2019-03-31 LAB — CBC
HCT: 42.2 % (ref 39.0–52.0)
HCT: 38.8 % — ABNORMAL LOW (ref 39.0–52.0)
Hemoglobin: 13.7 g/dL (ref 13.0–17.0)
Hemoglobin: 12.6 g/dL — ABNORMAL LOW (ref 13.0–17.0)
MCH: 32.8 pg (ref 26.0–34.0)
MCH: 32.8 pg (ref 26.0–34.0)
MCHC: 32.5 g/dL (ref 30.0–36.0)
MCHC: 32.5 g/dL (ref 30.0–36.0)
MCV: 101 fL — ABNORMAL HIGH (ref 80.0–100.0)
MCV: 101 fL — ABNORMAL HIGH (ref 80.0–100.0)
Platelets: 477 10*3/uL — ABNORMAL HIGH (ref 150–400)
Platelets: 414 10*3/uL — ABNORMAL HIGH (ref 150–400)
RBC: 4.18 MIL/uL — ABNORMAL LOW (ref 4.22–5.81)
RBC: 3.84 MIL/uL — ABNORMAL LOW (ref 4.22–5.81)
RDW: 12.7 % (ref 11.5–15.5)
RDW: 12.5 % (ref 11.5–15.5)
WBC: 17.1 10*3/uL — ABNORMAL HIGH (ref 4.0–10.5)
WBC: 17.1 10*3/uL — ABNORMAL HIGH (ref 4.0–10.5)
nRBC: 0 % (ref 0.0–0.2)
nRBC: 0 % (ref 0.0–0.2)

## 2019-03-31 LAB — LIPASE, BLOOD: Lipase: 23 U/L (ref 11–51)

## 2019-03-31 LAB — LACTIC ACID, PLASMA
Lactic Acid, Venous: 1.2 mmol/L (ref 0.5–1.9)
Lactic Acid, Venous: 1.2 mmol/L (ref 0.5–1.9)

## 2019-03-31 LAB — URINALYSIS, COMPLETE (UACMP) WITH MICROSCOPIC
Bacteria, UA: NONE SEEN
Bilirubin Urine: NEGATIVE
Glucose, UA: NEGATIVE mg/dL
Hgb urine dipstick: NEGATIVE
Ketones, ur: NEGATIVE mg/dL
Leukocytes,Ua: NEGATIVE
Nitrite: NEGATIVE
Protein, ur: NEGATIVE mg/dL
Specific Gravity, Urine: >1.046 — ABNORMAL HIGH (ref 1.005–1.030)
pH: 5 (ref 5.0–8.0)

## 2019-03-31 LAB — HEMOGLOBIN AND HEMATOCRIT, BLOOD
HCT: 38.5 % — ABNORMAL LOW (ref 39.0–52.0)
HCT: 33.9 % — ABNORMAL LOW (ref 39.0–52.0)
Hemoglobin: 12.9 g/dL — ABNORMAL LOW (ref 13.0–17.0)
Hemoglobin: 11.3 g/dL — ABNORMAL LOW (ref 13.0–17.0)

## 2019-03-31 LAB — MAGNESIUM: Magnesium: 2 mg/dL (ref 1.7–2.4)

## 2019-03-31 LAB — RESPIRATORY PANEL BY RT PCR (FLU A&B, COVID)
Influenza A by PCR: NEGATIVE
Influenza B by PCR: NEGATIVE
SARS Coronavirus 2 by RT PCR: NEGATIVE

## 2019-03-31 LAB — TROPONIN I (HIGH SENSITIVITY)
Troponin I (High Sensitivity): 18 ng/L — ABNORMAL HIGH (ref <18)
Troponin I (High Sensitivity): 19 ng/L — ABNORMAL HIGH (ref <18)

## 2019-03-31 MED ORDER — FENTANYL CITRATE (PF) 100 MCG/2ML IJ SOLN
25.0000 ug | Freq: Once | INTRAMUSCULAR | Status: AC
  Administered 2019-03-31: 01:00:00 25 ug via INTRAVENOUS
  Filled 2019-03-31: qty 2

## 2019-03-31 MED ORDER — HYDRALAZINE HCL 50 MG PO TABS
50.0000 mg | ORAL_TABLET | Freq: Three times a day (TID) | ORAL | Status: DC
  Administered 2019-03-31 – 2019-04-01 (×2): 50 mg via ORAL
  Filled 2019-03-31 (×4): qty 1

## 2019-03-31 MED ORDER — OXYMETAZOLINE HCL 0.05 % NA SOLN
1.0000 | Freq: Once | NASAL | Status: AC
  Administered 2019-03-31: 01:00:00 1 via NASAL
  Filled 2019-03-31: qty 30

## 2019-03-31 MED ORDER — SODIUM CHLORIDE 0.9 % IV SOLN: INTRAVENOUS | Status: DC

## 2019-03-31 MED ORDER — ENSURE ENLIVE PO LIQD
237.0000 mL | Freq: Two times a day (BID) | ORAL | Status: DC
  Administered 2019-03-31 – 2019-04-02 (×4): 237 mL via ORAL

## 2019-03-31 MED ORDER — ACETAMINOPHEN 650 MG RE SUPP: 650.0000 mg | Freq: Four times a day (QID) | RECTAL | Status: DC | PRN

## 2019-03-31 MED ORDER — FLUTICASONE PROPIONATE 50 MCG/ACT NA SUSP
1.0000 | Freq: Every day | NASAL | Status: DC
  Administered 2019-04-01 – 2019-04-02 (×2): 1 via NASAL
  Filled 2019-03-31 (×2): qty 16

## 2019-03-31 MED ORDER — IOHEXOL 350 MG/ML SOLN
100.0000 mL | Freq: Once | INTRAVENOUS | Status: AC | PRN
  Administered 2019-03-31: 02:00:00 100 mL via INTRAVENOUS

## 2019-03-31 MED ORDER — ACETAMINOPHEN 325 MG PO TABS
650.0000 mg | ORAL_TABLET | Freq: Four times a day (QID) | ORAL | Status: DC | PRN
  Administered 2019-03-31 – 2019-04-02 (×3): 650 mg via ORAL
  Filled 2019-03-31 (×3): qty 2

## 2019-03-31 MED ORDER — LISINOPRIL 10 MG PO TABS
40.0000 mg | ORAL_TABLET | Freq: Every day | ORAL | Status: DC
  Administered 2019-04-01: 40 mg via ORAL
  Filled 2019-03-31 (×2): qty 4

## 2019-03-31 MED ORDER — AZELASTINE HCL 0.1 % NA SOLN
2.0000 | Freq: Two times a day (BID) | NASAL | Status: DC
  Administered 2019-03-31 – 2019-04-02 (×5): 2 via NASAL
  Filled 2019-03-31: qty 30

## 2019-03-31 MED ORDER — MAGNESIUM HYDROXIDE 400 MG/5ML PO SUSP: 30.0000 mL | Freq: Every day | ORAL | Status: DC | PRN

## 2019-03-31 MED ORDER — CLOPIDOGREL BISULFATE 75 MG PO TABS
75.0000 mg | ORAL_TABLET | Freq: Every day | ORAL | Status: DC
  Administered 2019-03-31 – 2019-04-02 (×3): 75 mg via ORAL
  Filled 2019-03-31 (×3): qty 1

## 2019-03-31 MED ORDER — SODIUM CHLORIDE 0.9% FLUSH: 3.0000 mL | Freq: Once | INTRAVENOUS | Status: DC

## 2019-03-31 MED ORDER — ATORVASTATIN CALCIUM 10 MG PO TABS
10.0000 mg | ORAL_TABLET | Freq: Every day | ORAL | Status: DC
  Administered 2019-04-01: 18:00:00 10 mg via ORAL
  Filled 2019-03-31: qty 1

## 2019-03-31 MED ORDER — METRONIDAZOLE IN NACL 5-0.79 MG/ML-% IV SOLN
500.0000 mg | Freq: Once | INTRAVENOUS | Status: AC
  Administered 2019-03-31: 06:00:00 500 mg via INTRAVENOUS
  Filled 2019-03-31: qty 100

## 2019-03-31 MED ORDER — SODIUM CHLORIDE 0.9 % IV SOLN
1.0000 g | Freq: Once | INTRAVENOUS | Status: AC
  Administered 2019-03-31: 05:00:00 1 g via INTRAVENOUS
  Filled 2019-03-31: qty 10

## 2019-03-31 MED ORDER — CYANOCOBALAMIN 1000 MCG/ML IJ KIT: 1000.0000 ug | PACK | INTRAMUSCULAR | Status: DC

## 2019-03-31 MED ORDER — TRAZODONE HCL 50 MG PO TABS
25.0000 mg | ORAL_TABLET | Freq: Every evening | ORAL | Status: DC | PRN
  Administered 2019-03-31 – 2019-04-01 (×2): 25 mg via ORAL
  Filled 2019-03-31 (×2): qty 1

## 2019-03-31 MED ORDER — VITAMIN B-12 1000 MCG PO TABS
5000.0000 ug | ORAL_TABLET | Freq: Every day | ORAL | Status: DC
  Administered 2019-03-31 – 2019-04-02 (×3): 5000 ug via ORAL
  Filled 2019-03-31 (×3): qty 5

## 2019-03-31 MED ORDER — ONDANSETRON HCL 4 MG PO TABS: 4.0000 mg | ORAL_TABLET | Freq: Four times a day (QID) | ORAL | Status: DC | PRN

## 2019-03-31 MED ORDER — POTASSIUM CHLORIDE 20 MEQ PO PACK: 40.0000 meq | PACK | Freq: Once | ORAL | Status: DC

## 2019-03-31 MED ORDER — ZINC OXIDE 40 % EX OINT
1.0000 "application " | TOPICAL_OINTMENT | CUTANEOUS | Status: DC | PRN
  Filled 2019-03-31: qty 113

## 2019-03-31 MED ORDER — KETOROLAC TROMETHAMINE 30 MG/ML IJ SOLN: 15.0000 mg | Freq: Four times a day (QID) | INTRAMUSCULAR | Status: DC | PRN

## 2019-03-31 MED ORDER — ONDANSETRON HCL 4 MG/2ML IJ SOLN: 4.0000 mg | Freq: Four times a day (QID) | INTRAMUSCULAR | Status: DC | PRN

## 2019-03-31 MED ORDER — SODIUM CHLORIDE 0.9 % IV SOLN
1.0000 g | Freq: Two times a day (BID) | INTRAVENOUS | Status: DC
  Administered 2019-04-01 – 2019-04-02 (×3): 1 g via INTRAVENOUS
  Filled 2019-03-31: qty 10
  Filled 2019-03-31: qty 1
  Filled 2019-03-31 (×3): qty 10

## 2019-03-31 MED ORDER — ONDANSETRON HCL 4 MG/2ML IJ SOLN
4.0000 mg | Freq: Once | INTRAMUSCULAR | Status: AC
  Administered 2019-03-31: 01:00:00 4 mg via INTRAVENOUS
  Filled 2019-03-31: qty 2

## 2019-03-31 MED ORDER — DOCUSATE SODIUM 100 MG PO CAPS: 100.0000 mg | ORAL_CAPSULE | Freq: Two times a day (BID) | ORAL | Status: DC | PRN

## 2019-03-31 MED ORDER — METRONIDAZOLE IN NACL 5-0.79 MG/ML-% IV SOLN
500.0000 mg | Freq: Three times a day (TID) | INTRAVENOUS | Status: DC
  Administered 2019-03-31 – 2019-04-02 (×6): 500 mg via INTRAVENOUS
  Filled 2019-03-31 (×8): qty 100

## 2019-03-31 MED ORDER — HYDROXYUREA 500 MG PO CAPS
500.0000 mg | ORAL_CAPSULE | ORAL | Status: DC
  Administered 2019-03-31 – 2019-04-02 (×2): 500 mg via ORAL
  Filled 2019-03-31 (×2): qty 1

## 2019-03-31 MED ORDER — VITAMIN D 25 MCG (1000 UNIT) PO TABS
500.0000 [IU] | ORAL_TABLET | Freq: Every day | ORAL | Status: DC
  Administered 2019-03-31 – 2019-04-02 (×3): 500 [IU] via ORAL
  Filled 2019-03-31 (×3): qty 1

## 2019-03-31 NOTE — ED Notes (Signed)
Pt back from CT

## 2019-03-31 NOTE — ED Notes (Signed)
Pt resting comfortably with family at bedside, awaiting disposition.

## 2019-03-31 NOTE — H&P (Addendum)
[facility] NAME: Jimmy Jimenez    MR#:  [id number]  DATE OF BIRTH:  03/13/1925  DATE OF ADMISSION:  03/31/2019  PRIMARY CARE PHYSICIAN: Crecencio Mc, MD   REQUESTING/REFERRING PHYSICIAN: Marjean Donna, MD  CHIEF COMPLAINT:   Chief Complaint  Patient presents with   Abdominal Pain    HISTORY OF PRESENT ILLNESS:  Jimmy Jimenez  is a 86 y.o. African-American male with a known history of hypertension, dyslipidemia and thrombocytosis, who presented to the emergency room with onset of generalized abdominal pain with associated diarrhea since yesterday which has been loose and watery with minimal amount of blood once without melena.  No known history of hemorrhoids.  No fever or chills.  He has been having mild dyspnea without cough or wheezing or hemoptysis.  No other bleeding diathesis.  No dysuria, oliguria or hematuria or flank pain.  He denies any heartburn or nausea or vomiting.  Upon presentation to the emergency room, blood pressure was 163/64 with respirate of 22 with otherwise normal vital signs.  Labs revealed mild hypokalemia and hypochloremia with scalp leukocytosis of 17.1, troponin I of 18 and later 19 and lactic acid 1.2.  Influenza A-B antigen as well as COVID-19 PCR came back negative.  Urinalysis showed more than 1046 specific gravity and was otherwise unremarkable.  CT of chest and abdomen results in detail as below.  There were remarkable for interstitial pulmonary fibrosis and 1.2 cm subpleural nodule in the right upper lobe which has been stable, coronary artery at and aortic atherosclerosis, infectious, inflammatory or possibly ischemic colitis extending from the distal colon to the rectum and to the transverse colon.  There was stable separate cystic structure in the head of the pancreas and possible cystitis.  The patient was given IV Rocephin and Flagyl as well as Zofran and fentanyl.  Stool studies were obtained.  He will be admitted  to medical monitored bed for further evaluation and management. PAST MEDICAL HISTORY:   Past Medical History:  Diagnosis Date   Colon polyps    Essential thrombocytosis ([place])    Essential thrombocytosis ([place])    Hyperlipidemia    Hypertension    Lung nodule 12/2004   found on CXR   Osteoporosis     PAST SURGICAL HISTORY:   Past Surgical History:  Procedure Laterality Date   BONE MARROW BIOPSY  01/18/07   COLONOSCOPY  2007, 2010   Dr Allen Norris   IR CT HEAD LTD  03/04/2019   IR PERCUTANEOUS ART THROMBECTOMY/INFUSION INTRACRANIAL INC DIAG ANGIO  03/04/2019   IR RADIOLOGIST EVAL & MGMT  03/20/2019   IR US GUIDE BX ASP/DRAIN  03/07/2019   IR US GUIDE VASC ACCESS RIGHT  03/04/2019   LOOP RECORDER INSERTION N/A 03/07/2019   Procedure: LOOP RECORDER INSERTION;  Surgeon: Constance Haw, MD;  Location: [facility];  Service: Cardiovascular;  Laterality: N/A;   NASAL SINUS SURGERY  06/11/05   RADIOLOGY WITH ANESTHESIA N/A 03/03/2019   Procedure: IR WITH ANESTHESIA;  Surgeon: Luanne Bras, MD;  Location: [facility];  Service: Radiology;  Laterality: N/A;    SOCIAL HISTORY:   Social History   Tobacco Use   Smoking status: Never Smoker   Smokeless tobacco: Never Used  Substance Use Topics   Alcohol use: No    FAMILY HISTORY:   Family History  Problem Relation Age of Onset   Colon cancer Mother        colon age  103's   Peripheral vascular disease Father    Colon cancer Brother        colon age 4's   Diabetes Other    Prostate cancer Neg Hx    Bladder Cancer Neg Hx    Kidney cancer Neg Hx     DRUG ALLERGIES:   Allergies  Allergen Reactions   Amlodipine     swelling   Atenolol     swelling   Erythromycin     Other reaction(s): Hallucination   Furosemide     Dizziness    Hctz [Hydrochlorothiazide]     hyponatremia   Hydrochlorothiazide W-Triamterene     Other reaction(s): Other (See Comments) Patient stated is causes low  sodium Patient stated is causes low sodium Patient stated is causes low sodium  Patient stated is causes low sodium   Patient stated is causes low sodium Patient stated is causes low sodium   Levofloxacin     Causes sleepiness   Metoprolol     dizziness   Micardis [Telmisartan]     Causes swelling   Penicillins Itching    Has patient had a PCN reaction causing immediate rash, facial/tongue/throat swelling, SOB or lightheadedness with hypotension: No Has patient had a PCN reaction causing severe rash involving mucus membranes or skin necrosis: No Has patient had a PCN reaction that required hospitalization No Has patient had a PCN reaction occurring within the last 10 years: No If all of the above answers are "NO", then may proceed with Cephalosporin use.   Terazosin     Causes swelling in legs and feet   Triamterene-Hctz     Patient stated is causes low sodium    REVIEW OF SYSTEMS:   ROS As per history of present illness. All pertinent systems were reviewed above. Constitutional,  HEENT, cardiovascular, respiratory, GI, GU, musculoskeletal, neuro, psychiatric, endocrine,  integumentary and hematologic systems were reviewed and are otherwise  negative/unremarkable except for positive findings mentioned above in the HPI.   MEDICATIONS AT HOME:   Prior to Admission medications   Medication Sig Start Date End Date Taking? Authorizing Provider  atorvastatin (LIPITOR) 10 MG tablet Take 1 tablet (10 mg total) by mouth daily at 6 PM. 03/08/19  Yes Biby, Massie Kluver, NP  azelastine (ASTELIN) 0.1 % nasal spray Place 2 sprays into both nostrils 2 (two) times daily.  02/10/19  Yes [provider]  benzonatate (TESSALON) 200 MG capsule Take 1 capsule (200 mg total) by mouth 2 (two) times daily as needed for cough. 01/04/19  Yes Crecencio Mc, MD  Calcium Citrate-Vitamin D (CITRACAL + D PO) Take 2 tablets by mouth daily. Take 2 by mouth daily, calcium 630 and vitamin D 500  each.    Yes [provider]  Cholecalciferol (VITAMIN D3) 10 MCG (400 UNIT) CAPS Take 500 Units by mouth daily. Take 2 by mouth daily    Yes [provider]  clopidogrel (PLAVIX) 75 MG tablet Take 1 tablet (75 mg total) by mouth daily. 03/09/19  Yes Donzetta Starch, NP  clotrimazole-betamethasone (LOTRISONE) cream Apply topically 2 (two) times daily. 11/28/18  Yes Crecencio Mc, MD  Cyanocobalamin (B-12 COMPLIANCE INJECTION) 1000 MCG/ML KIT Inject 1,000 mcg as directed every 30 (thirty) days.    Yes [provider]  cyanocobalamin 1000 MCG tablet Take 5,000 mcg by mouth daily.   Yes [provider]  docusate sodium (COLACE) 100 MG capsule Take 100 mg by mouth 2 (two) times daily as needed for  mild constipation.   Yes [provider]  esomeprazole (NEXIUM) 40 MG capsule TAKE 1 CAPSULE BY MOUTH ONCE DAILY AT NOON 11/27/18  Yes Crecencio Mc, MD  fluticasone [facility]) 50 MCG/ACT nasal spray Use 1-2 sprays in each nostril daily as needed 03/01/19  Yes [provider]  furosemide (LASIX) 20 MG tablet Take 1 tablet (20 mg total) by mouth daily. 03/22/19  Yes Crecencio Mc, MD  hydrALAZINE (APRESOLINE) 50 MG tablet Take 50 mg by mouth 3 (three) times daily.   Yes [provider]  hydroxyurea (HYDREA) 500 MG capsule TAKE 1 CAPSULE BY MOUTH EVERY OTHER DAY MAY TAKE WITH FOOD TO MINIMIZE GI SIDE EFFECTS. 08/08/18  Yes Cammie Sickle, MD  Infant Care Products ([place]) CREA Apply 1 application topically as needed (after each bowel movement for barrier for  pressure ulcer.). 02/15/19  Yes Crecencio Mc, MD  lisinopril (ZESTRIL) 40 MG tablet Take 1 tablet (40 mg total) by mouth at bedtime. 03/22/19  Yes Crecencio Mc, MD  testosterone cypionate (DEPOTESTOSTERONE CYPIONATE) 200 MG/ML injection INJECT 0.25 ML INTRAMUSCULARLY EVERY 14 DAYS AS DIRECTED 02/16/19  Yes Crecencio Mc, MD  albuterol (VENTOLIN HFA) 108 (90 Base) MCG/ACT inhaler  Inhale 2 puffs into the lungs every 6 (six) hours as needed for wheezing or shortness of breath. Patient not taking: Reported on 03/22/2019 03/03/19   Loletha Grayer, MD  feeding supplement, ENSURE ENLIVE, (ENSURE ENLIVE) LIQD Take 237 mLs by mouth 2 (two) times daily between meals. 03/08/19   Donzetta Starch, NP      VITAL SIGNS:  Blood pressure (!) 143/47, pulse 65, temperature 98 F (36.7 C), temperature source Oral, resp. rate 20, height 5' 8"  (1.727 m), weight 59.9 kg, SpO2 96 %.  PHYSICAL EXAMINATION:  Physical Exam  GENERAL:  86 y.o.-year-old ill-looking, cachectic African-American male patient lying in the bed with no acute distress.  EYES: Pupils equal, round, reactive to light and accommodation. No scleral icterus. Extraocular muscles intact.  HEENT: Head atraumatic, normocephalic. Oropharynx and nasopharynx clear.  NECK:  Supple, no jugular venous distention. No thyroid enlargement, no tenderness.  LUNGS: Normal breath sounds bilaterally, no wheezing, rales,rhonchi or crepitation. No use of accessory muscles of respiration.  CARDIOVASCULAR: Regular rate and rhythm, S1, S2 normal. No murmurs, rubs, or gallops.  ABDOMEN: Soft, nondistended, nontender. Bowel sounds present. No organomegaly or mass.  EXTREMITIES: No pedal edema, cyanosis, or clubbing.  NEUROLOGIC: Cranial nerves II through XII are intact. Muscle strength 5/5 in all extremities. Sensation intact. Gait not checked.  PSYCHIATRIC: The patient is alert and oriented x 3.  Normal affect and good eye contact. SKIN: No obvious rash, lesion, or ulcer.   LABORATORY PANEL:   CBC Recent Labs  Lab 03/31/19 0020  WBC 17.1*  HGB 13.7  HCT 42.2  PLT 477*   ------------------------------------------------------------------------------------------------------------------  Chemistries  Recent Labs  Lab 03/31/19 0020  NA 137  K 3.3*  CL 93*  CO2 31  GLUCOSE 200*  BUN 23  CREATININE 1.14  CALCIUM 9.6  AST 25  ALT 19   ALKPHOS 86  BILITOT 1.3*   ------------------------------------------------------------------------------------------------------------------  Cardiac Enzymes No results for input(s): TROPONINI in the last 168 hours. ------------------------------------------------------------------------------------------------------------------  RADIOLOGY:  CT Angio Chest PE W and/or Wo Contrast  Result Date: 03/31/2019 CLINICAL DATA:  Intermittent shortness of breath, lower abdominal pain and fever with diarrhea for 4 days EXAM: CT ANGIOGRAPHY CHEST CT ABDOMEN AND PELVIS WITH CONTRAST TECHNIQUE: Multidetector CT imaging of the  chest was performed using the standard protocol during bolus administration of intravenous contrast. Multiplanar CT image reconstructions and MIPs were obtained to evaluate the vascular anatomy. Multidetector CT imaging of the abdomen and pelvis was performed using the standard protocol during bolus administration of intravenous contrast. CONTRAST:  126m OMNIPAQUE IOHEXOL 350 MG/ML SOLN COMPARISON:  CTA chest 03/02/2019 MR abdomen 12/22/2018, CT abdomen pelvis 08/31/2016-2 FINDINGS: CTA CHEST FINDINGS Cardiovascular: Satisfactory opacification the pulmonary arteries to the segmental level. No pulmonary artery filling defects are identified. There is borderline enlargement of the central pulmonary arteries which is similar to comparison CT. Atherosclerotic calcification of the normal caliber thoracic aorta with some moderate tortuosity. Shared origin of brachiocephalic and left common carotid arteries. Proximal great vessels are unremarkable. Overall normal cardiac size with some right atrial ventricular dilatation. Coronary artery calcifications are present. No pericardial effusion. Mediastinum/Nodes: Few scattered low-attenuation partially calcified mediastinal and hilar nodes are similar to prior. No suspicious adenopathy. No acute abnormality of the trachea or esophagus. Thyroid gland and  thoracic inlet are unremarkable. Lungs/Pleura: There is bilateral subpleural reticulation in a basilar predominance with stacked cyst formation in the lung periphery. More dense opacity in the periphery of the right lung base may reflect some pulmonary microlithiasis which is unchanged from multiple priors. No superimposed areas of acute airspace disease are seen. There is a 1.2 cm subpleural nodule in the right upper lobe (4/43). This is unchanged from comparisons as remote is 2018 no new nodules are seen. No pneumothorax or effusion. Musculoskeletal: Multilevel degenerative changes are present in the imaged portions of the spine. No acute osseous abnormality or suspicious osseous lesion. Vertebral hemangioma at T10 is unchanged from priors. No worrisome chest wall lesions. Review of the MIP images confirms the above findings. CT ABDOMEN and PELVIS FINDINGS Hepatobiliary: No focal liver abnormality is seen. No gallstones, gallbladder wall thickening, or biliary dilatation. Interposition of the hepatic flexure anterior to the liver. This configuration is similar to comparison is. Pancreas: Unremarkable. No pancreatic ductal dilatation or surrounding inflammatory changes. Septate cystic structure at the head of the pancreas measures approximately 3.3 by 2.1 cm in size, not significantly changed from comparison MRI. No new pancreatic lesions. No pancreatic ductal dilatation. Spleen: Normal in size without focal abnormality. Adrenals/Urinary Tract: Normal adrenal glands. Few subcentimeter hypoattenuating foci in the right kidney are too small to fully characterize. No suspicious renal lesions are seen. No visible urolithiasis or hydronephrosis. Kidneys enhance and excrete symmetrically. There is mild asymmetric bladder wall thickening more pronounced anteriorly, similar to priors. Stomach/Bowel: Distal esophagus, stomach and duodenal sweep are unremarkable. No small bowel wall thickening or dilatation. No evidence of  obstruction. Cecum is displaced superiorly with the hepatic flexure interposed anterior to the liver. A normal appendix is visualized. There is predominantly distal colonic edematous mural thickening with mucosal hyperemia which is contiguous from the level of the rectum to the transverse colon. Vascular/Lymphatic: Atherosclerotic plaque within the normal caliber aorta. No proximal vascular occlusions are seen. No pathologic abdominopelvic adenopathy. Reproductive: The prostate and seminal vesicles are unremarkable aside from mild prostatomegaly. Other: No abdominopelvic free fluid or free gas. No bowel containing hernias. Mild body wall edema. Musculoskeletal: Multilevel degenerative changes are present in the imaged portions of the spine. Mild grade 1 anterolisthesis of L4 on L5. Additional degenerative changes in the hips and SI joints. No acute osseous abnormality or suspicious osseous lesion. Review of the MIP images confirms the above findings. IMPRESSION: 1. No evidence for acute pulmonary artery embolism. 2.  Stable 1.2 cm subpleural nodule in the right upper lobe. Given the stability over time, this is most likely benign. 3. Chronic interstitial lung disease. Findings are categorized as probable UIP per consensus guidelines: Diagnosis of Idiopathic Pulmonary Fibrosis: An Official ATS/ERS/JRS/ALAT Clinical Practice Guideline. [place], Iss 5, [phone], Oct 30 2016. 4. Coronary artery calcifications. 5.  Aortic Atherosclerosis (ICD10-I70.0). 6. Circumferential edematous mural thickening and mucosal hyperemia involving the distal colon extending from the rectum to the transverse colon, consistent with an infectious, inflammatory or possibly ischemic colitis though no open proximal vascular occlusions are clearly seen. 7. Stable septate cystic structure at the head of the pancreas, likely side-branch IPMN versus pseudocyst. 8. Asymmetric bladder wall thickening, more pronounced  anteriorly, similar to priors. Correlate with urinalysis to exclude cystitis but such features could also be seen with chronic outlet obstruction given mild prostatomegaly. Electronically Signed   By: Lovena Le M.D.   On: 03/31/2019 02:53   CT ABDOMEN PELVIS W CONTRAST  Result Date: 03/31/2019 CLINICAL DATA:  Intermittent shortness of breath, lower abdominal pain and fever with diarrhea for 4 days EXAM: CT ANGIOGRAPHY CHEST CT ABDOMEN AND PELVIS WITH CONTRAST TECHNIQUE: Multidetector CT imaging of the chest was performed using the standard protocol during bolus administration of intravenous contrast. Multiplanar CT image reconstructions and MIPs were obtained to evaluate the vascular anatomy. Multidetector CT imaging of the abdomen and pelvis was performed using the standard protocol during bolus administration of intravenous contrast. CONTRAST:  176m OMNIPAQUE IOHEXOL 350 MG/ML SOLN COMPARISON:  CTA chest 03/02/2019 MR abdomen 12/22/2018, CT abdomen pelvis 08/31/2016-2 FINDINGS: CTA CHEST FINDINGS Cardiovascular: Satisfactory opacification the pulmonary arteries to the segmental level. No pulmonary artery filling defects are identified. There is borderline enlargement of the central pulmonary arteries which is similar to comparison CT. Atherosclerotic calcification of the normal caliber thoracic aorta with some moderate tortuosity. Shared origin of brachiocephalic and left common carotid arteries. Proximal great vessels are unremarkable. Overall normal cardiac size with some right atrial ventricular dilatation. Coronary artery calcifications are present. No pericardial effusion. Mediastinum/Nodes: Few scattered low-attenuation partially calcified mediastinal and hilar nodes are similar to prior. No suspicious adenopathy. No acute abnormality of the trachea or esophagus. Thyroid gland and thoracic inlet are unremarkable. Lungs/Pleura: There is bilateral subpleural reticulation in a basilar predominance with  stacked cyst formation in the lung periphery. More dense opacity in the periphery of the right lung base may reflect some pulmonary microlithiasis which is unchanged from multiple priors. No superimposed areas of acute airspace disease are seen. There is a 1.2 cm subpleural nodule in the right upper lobe (4/43). This is unchanged from comparisons as remote is 2018 no new nodules are seen. No pneumothorax or effusion. Musculoskeletal: Multilevel degenerative changes are present in the imaged portions of the spine. No acute osseous abnormality or suspicious osseous lesion. Vertebral hemangioma at T10 is unchanged from priors. No worrisome chest wall lesions. Review of the MIP images confirms the above findings. CT ABDOMEN and PELVIS FINDINGS Hepatobiliary: No focal liver abnormality is seen. No gallstones, gallbladder wall thickening, or biliary dilatation. Interposition of the hepatic flexure anterior to the liver. This configuration is similar to comparison is. Pancreas: Unremarkable. No pancreatic ductal dilatation or surrounding inflammatory changes. Septate cystic structure at the head of the pancreas measures approximately 3.3 by 2.1 cm in size, not significantly changed from comparison MRI. No new pancreatic lesions. No pancreatic ductal dilatation. Spleen: Normal in size without focal abnormality. Adrenals/Urinary Tract:  Normal adrenal glands. Few subcentimeter hypoattenuating foci in the right kidney are too small to fully characterize. No suspicious renal lesions are seen. No visible urolithiasis or hydronephrosis. Kidneys enhance and excrete symmetrically. There is mild asymmetric bladder wall thickening more pronounced anteriorly, similar to priors. Stomach/Bowel: Distal esophagus, stomach and duodenal sweep are unremarkable. No small bowel wall thickening or dilatation. No evidence of obstruction. Cecum is displaced superiorly with the hepatic flexure interposed anterior to the liver. A normal appendix is  visualized. There is predominantly distal colonic edematous mural thickening with mucosal hyperemia which is contiguous from the level of the rectum to the transverse colon. Vascular/Lymphatic: Atherosclerotic plaque within the normal caliber aorta. No proximal vascular occlusions are seen. No pathologic abdominopelvic adenopathy. Reproductive: The prostate and seminal vesicles are unremarkable aside from mild prostatomegaly. Other: No abdominopelvic free fluid or free gas. No bowel containing hernias. Mild body wall edema. Musculoskeletal: Multilevel degenerative changes are present in the imaged portions of the spine. Mild grade 1 anterolisthesis of L4 on L5. Additional degenerative changes in the hips and SI joints. No acute osseous abnormality or suspicious osseous lesion. Review of the MIP images confirms the above findings. IMPRESSION: 1. No evidence for acute pulmonary artery embolism. 2. Stable 1.2 cm subpleural nodule in the right upper lobe. Given the stability over time, this is most likely benign. 3. Chronic interstitial lung disease. Findings are categorized as probable UIP per consensus guidelines: Diagnosis of Idiopathic Pulmonary Fibrosis: An Official ATS/ERS/JRS/ALAT Clinical Practice Guideline. [place], Iss 5, [phone], Oct 30 2016. 4. Coronary artery calcifications. 5.  Aortic Atherosclerosis (ICD10-I70.0). 6. Circumferential edematous mural thickening and mucosal hyperemia involving the distal colon extending from the rectum to the transverse colon, consistent with an infectious, inflammatory or possibly ischemic colitis though no open proximal vascular occlusions are clearly seen. 7. Stable septate cystic structure at the head of the pancreas, likely side-branch IPMN versus pseudocyst. 8. Asymmetric bladder wall thickening, more pronounced anteriorly, similar to priors. Correlate with urinalysis to exclude cystitis but such features could also be seen with chronic  outlet obstruction given mild prostatomegaly. Electronically Signed   By: Lovena Le M.D.   On: 03/31/2019 02:53      IMPRESSION AND PLAN:   1.  Acute colitis, most likely infectious given significant leukocytosis.  This is associated with sepsis without severe sepsis or septic shock.  The patient will be admitted to a medically monitored bed.  He will be continued on antibiotic therapy with IV Rocephin and Flagyl.  Pain management will be provided.  Stool studies will be followed.  A general surgery consultation will be obtained in a.m.  Given the minimal amount of bleeding that could be related to hemorrhagic or ischemic colitis will follow serial hemoglobin and hematocrits.  We will also obtain an GI consultation by Dr. Allen Norris.  I notified him about the patient.  2.  Hypokalemia.  This like secondary to recurrent diarrhea.  We will replace potassium and check magnesium level.  3.  Hypertension.  Continue hydralazine.  4.  Thrombocytosis.  We will continue hydroxyurea.  5.  Vitamin B12 deficiency.  We will continue vitamin B12.  6.  DVT prophylaxis.  SCDs.  We will hold off on medical prophylaxis given minimal amount of bleeding that could be related to straining and mild hemorrhoids.  We will monitor for further GI bleeding and obtain stool Hemoccult.  This could be related to minimal hemorrhage with his colitis.  All the records are reviewed and case discussed with ED provider. The plan of care was discussed in details with the patient (and family). I answered all questions. The patient agreed to proceed with the above mentioned plan. Further management will depend upon hospital course.   CODE STATUS: The CODE STATUS was discussed with the patient and he clearly indicated he wants to have resuscitation with CPR, DC shocks and IV medications but not to be intubated.  He is therefore under only DNI status.  TOTAL TIME TAKING CARE OF THIS PATIENT: 55 minutes.    Christel Mormon M.D on  03/31/2019 at 5:06 AM  [facility]   From 7 PM-7 AM, contact night-coverage [email or web address]  CC: Primary care physician; Crecencio Mc, MD   Note: This dictation was prepared with Dragon dictation along with smaller phrase technology. Any transcriptional errors that result from this process are unintentional.

## 2019-03-31 NOTE — ED Notes (Signed)
Report called to RN, pt admitted to 221 via stretcher. Wife at bedside.

## 2019-03-31 NOTE — ED Triage Notes (Signed)
Abdominal pain that started today.

## 2019-03-31 NOTE — ED Notes (Signed)
Pt in with co lower abd for 2 days, started having diarrhea today x4. Denies any vomiting no dysuria. Pt is eating and drinking well, no fever. Pt states does get shob at times, no shob noted at this time.

## 2019-03-31 NOTE — ED Notes (Signed)
Hospitalist in to admit 

## 2019-03-31 NOTE — Consult Note (Signed)
Jimmy Lame, MD [facility]  [address] [place], [place] [address] Phone: [phone] Fax : [phone]  Consultation  Referring Provider:     Dr. Posey Pronto Primary Care Physician:  Crecencio Mc, MD Primary Gastroenterologist: Althia Forts         Reason for Consultation:     Colitis  Date of Admission:  03/31/2019 Date of Consultation:  03/31/2019         HPI:   Jimmy Jimenez is a 86 y.o. male who came in with 1 day of abdominal pain with rectal bleeding.  The patient is able to give me some of the history and his wife fills in the rest of the history.  She reports that she had to clean them out multiple times after having rectal bleeding.  The patient came in and had a CT scan showing inflammation from the rectum all the way up to the transverse colon consistent with colitis caused by infection versus inflammation versus ischemia.  The patient reports that he has had only 1 bowel movement today that had blood in it and otherwise his abdominal pain is much better.  He denies ever having the symptoms in the past.  He also reports that he he has not had a colonoscopy in many years. The last colonoscopy on record was done for screening when he was 86 years old in 2007 and that was done by me.  The patient was recently in the hospital and was sent home on 14 days of antibiotics.  The patient's wife is not sure when he finished antibiotics but states that he was on antibiotics recently. The patient had a stool sent off for C. difficile and for GI pathogens either which is back.  Patient's hemoglobin today was 12.9 with his admission hemoglobin of 13.7 but he was discharged from the hospital on 5 January at 11.8.  The patient is on anticoagulation for a recent embolic CVA.  Past Medical History:  Diagnosis Date  . Colon polyps   . Essential thrombocytosis ([place])   . Essential thrombocytosis ([place])   . Hyperlipidemia   . Hypertension   . Lung nodule 12/2004   found on CXR  . Osteoporosis      Past Surgical History:  Procedure Laterality Date  . BONE MARROW BIOPSY  01/18/07  . COLONOSCOPY  2007, 2010   Dr Allen Norris  . IR CT HEAD LTD  03/04/2019  . IR PERCUTANEOUS ART THROMBECTOMY/INFUSION INTRACRANIAL INC DIAG ANGIO  03/04/2019  . IR RADIOLOGIST EVAL & MGMT  03/20/2019  . IR US GUIDE BX ASP/DRAIN  03/07/2019  . IR US GUIDE VASC ACCESS RIGHT  03/04/2019  . LOOP RECORDER INSERTION N/A 03/07/2019   Procedure: LOOP RECORDER INSERTION;  Surgeon: Constance Haw, MD;  Location: [facility];  Service: Cardiovascular;  Laterality: N/A;  . NASAL SINUS SURGERY  06/11/05  . RADIOLOGY WITH ANESTHESIA N/A 03/03/2019   Procedure: IR WITH ANESTHESIA;  Surgeon: Luanne Bras, MD;  Location: [facility];  Service: Radiology;  Laterality: N/A;    Prior to Admission medications   Medication Sig Start Date End Date Taking? Authorizing Provider  atorvastatin (LIPITOR) 10 MG tablet Take 1 tablet (10 mg total) by mouth daily at 6 PM. 03/08/19  Yes Biby, Massie Kluver, NP  azelastine (ASTELIN) 0.1 % nasal spray Place 2 sprays into both nostrils 2 (two) times daily.  02/10/19  Yes [provider]  benzonatate (TESSALON) 200 MG capsule Take 1 capsule (200 mg total)  by mouth 2 (two) times daily as needed for cough. 01/04/19  Yes Crecencio Mc, MD  Calcium Citrate-Vitamin D (CITRACAL + D PO) Take 2 tablets by mouth daily. Take 2 by mouth daily, calcium 630 and vitamin D 500 each.    Yes [provider]  Cholecalciferol (VITAMIN D3) 10 MCG (400 UNIT) CAPS Take 500 Units by mouth daily. Take 2 by mouth daily    Yes [provider]  clopidogrel (PLAVIX) 75 MG tablet Take 1 tablet (75 mg total) by mouth daily. 03/09/19  Yes Donzetta Starch, NP  clotrimazole-betamethasone (LOTRISONE) cream Apply topically 2 (two) times daily. 11/28/18  Yes Crecencio Mc, MD  Cyanocobalamin (B-12 COMPLIANCE INJECTION) 1000 MCG/ML KIT Inject 1,000 mcg as directed every 30 (thirty) days.    Yes [provider]  cyanocobalamin 1000 MCG tablet Take 5,000 mcg by mouth daily.   Yes [provider]  docusate sodium (COLACE) 100 MG capsule Take 100 mg by mouth 2 (two) times daily as needed for mild constipation.   Yes [provider]  esomeprazole (NEXIUM) 40 MG capsule TAKE 1 CAPSULE BY MOUTH ONCE DAILY AT NOON 11/27/18  Yes Crecencio Mc, MD  fluticasone [facility]) 50 MCG/ACT nasal spray Use 1-2 sprays in each nostril daily as needed 03/01/19  Yes [provider]  furosemide (LASIX) 20 MG tablet Take 1 tablet (20 mg total) by mouth daily. 03/22/19  Yes Crecencio Mc, MD  hydrALAZINE (APRESOLINE) 50 MG tablet Take 50 mg by mouth 3 (three) times daily.   Yes [provider]  hydroxyurea (HYDREA) 500 MG capsule TAKE 1 CAPSULE BY MOUTH EVERY OTHER DAY MAY TAKE WITH FOOD TO MINIMIZE GI SIDE EFFECTS. 08/08/18  Yes Cammie Sickle, MD  Infant Care Products ([place]) CREA Apply 1 application topically as needed (after each bowel movement for barrier for  pressure ulcer.). 02/15/19  Yes Crecencio Mc, MD  lisinopril (ZESTRIL) 40 MG tablet Take 1 tablet (40 mg total) by mouth at bedtime. 03/22/19  Yes Crecencio Mc, MD  testosterone cypionate (DEPOTESTOSTERONE CYPIONATE) 200 MG/ML injection INJECT 0.25 ML INTRAMUSCULARLY EVERY 14 DAYS AS DIRECTED 02/16/19  Yes Crecencio Mc, MD  albuterol (VENTOLIN HFA) 108 (90 Base) MCG/ACT inhaler Inhale 2 puffs into the lungs every 6 (six) hours as needed for wheezing or shortness of breath. Patient not taking: Reported on 03/22/2019 03/03/19   Loletha Grayer, MD  feeding supplement, ENSURE ENLIVE, (ENSURE ENLIVE) LIQD Take 237 mLs by mouth 2 (two) times daily between meals. 03/08/19   Donzetta Starch, NP    Family History  Problem Relation Age of Onset  . Colon cancer Mother        colon age 73's  . Peripheral vascular disease Father   . Colon cancer Brother        colon age 21's  . Diabetes Other   . Prostate  cancer Neg Hx   . Bladder Cancer Neg Hx   . Kidney cancer Neg Hx      Social History   Tobacco Use  . Smoking status: Never Smoker  . Smokeless tobacco: Never Used  Substance Use Topics  . Alcohol use: No  . Drug use: No    Allergies as of 03/31/2019 - Review Complete 03/31/2019  Allergen Reaction Noted  . Amlodipine  10/13/2010  . Atenolol  10/13/2010  . Erythromycin  12/07/2014  . Furosemide  10/13/2010  . Hctz [hydrochlorothiazide]  12/14/2012  . Hydrochlorothiazide w-triamterene  10/13/2010  . Levofloxacin  10/13/2010  . Metoprolol  10/13/2010  . Micardis [telmisartan]  10/13/2010  . Penicillins Itching 11/20/2010  . Terazosin  10/13/2010  . Triamterene-hctz  10/13/2010    Review of Systems:    All systems reviewed and negative except where noted in HPI.   Physical Exam:  Vital signs in last 24 hours: Temp:  [97.5 F (36.4 C)-98 F (36.7 C)] 97.5 F (36.4 C) (01/30 0535) Pulse Rate:  [65-86] 75 (01/30 1138) Resp:  [19-22] 22 (01/30 0535) BP: (137-163)/(47-64) 155/50 (01/30 1138) SpO2:  [95 %-97 %] 97 % (01/30 0535) Weight:  [59.9 kg] 59.9 kg (01/30 0008) Last BM Date: 03/30/19 General:   Pleasant, cooperative in NAD Head:  Normocephalic and atraumatic. Eyes:   No icterus.   Conjunctiva pink. PERRLA. Ears:  Normal auditory acuity. Neck:  Supple; no masses or thyroidomegaly Lungs: Respirations even and unlabored. Lungs clear to auscultation bilaterally.   No wheezes, crackles, or rhonchi.  Heart:  Regular rate and rhythm;  Without murmur, clicks, rubs or gallops Abdomen:  Soft, nondistended, nontender. Normal bowel sounds. No appreciable masses or hepatomegaly.  No rebound or guarding.  Rectal:  Not performed. Msk:  Symmetrical without gross deformities.    Extremities:  Without edema, cyanosis or clubbing. Neurologic:  Alert and oriented x3;  grossly normal neurologically. Skin:  Intact without significant lesions or rashes. Cervical Nodes:  No  significant cervical adenopathy. Psych:  Alert and cooperative. Normal affect.  LAB RESULTS: Recent Labs    03/31/19 0020 03/31/19 0603 03/31/19 1123  WBC 17.1* 17.1*  --   HGB 13.7 12.6* 12.9*  HCT 42.2 38.8* 38.5*  PLT 477* 414*  --    BMET Recent Labs    03/31/19 0020 03/31/19 0603  NA 137 137  K 3.3* 4.0  CL 93* 95*  CO2 31 32  GLUCOSE 200* 135*  BUN 23 21  CREATININE 1.14 1.04  CALCIUM 9.6 9.0   LFT Recent Labs    03/31/19 0020  PROT 7.0  ALBUMIN 4.1  AST 25  ALT 19  ALKPHOS 86  BILITOT 1.3*   PT/INR No results for input(s): LABPROT, INR in the last 72 hours.  STUDIES: CT Angio Chest PE W and/or Wo Contrast  Result Date: 03/31/2019 CLINICAL DATA:  Intermittent shortness of breath, lower abdominal pain and fever with diarrhea for 4 days EXAM: CT ANGIOGRAPHY CHEST CT ABDOMEN AND PELVIS WITH CONTRAST TECHNIQUE: Multidetector CT imaging of the chest was performed using the standard protocol during bolus administration of intravenous contrast. Multiplanar CT image reconstructions and MIPs were obtained to evaluate the vascular anatomy. Multidetector CT imaging of the abdomen and pelvis was performed using the standard protocol during bolus administration of intravenous contrast. CONTRAST:  166m OMNIPAQUE IOHEXOL 350 MG/ML SOLN COMPARISON:  CTA chest 03/02/2019 MR abdomen 12/22/2018, CT abdomen pelvis 08/31/2016-2 FINDINGS: CTA CHEST FINDINGS Cardiovascular: Satisfactory opacification the pulmonary arteries to the segmental level. No pulmonary artery filling defects are identified. There is borderline enlargement of the central pulmonary arteries which is similar to comparison CT. Atherosclerotic calcification of the normal caliber thoracic aorta with some moderate tortuosity. Shared origin of brachiocephalic and left common carotid arteries. Proximal great vessels are unremarkable. Overall normal cardiac size with some right atrial ventricular dilatation. Coronary artery  calcifications are present. No pericardial effusion. Mediastinum/Nodes: Few scattered low-attenuation partially calcified mediastinal and hilar nodes are similar to prior. No suspicious adenopathy. No acute abnormality of the trachea or esophagus. Thyroid gland and thoracic  inlet are unremarkable. Lungs/Pleura: There is bilateral subpleural reticulation in a basilar predominance with stacked cyst formation in the lung periphery. More dense opacity in the periphery of the right lung base may reflect some pulmonary microlithiasis which is unchanged from multiple priors. No superimposed areas of acute airspace disease are seen. There is a 1.2 cm subpleural nodule in the right upper lobe (4/43). This is unchanged from comparisons as remote is 2018 no new nodules are seen. No pneumothorax or effusion. Musculoskeletal: Multilevel degenerative changes are present in the imaged portions of the spine. No acute osseous abnormality or suspicious osseous lesion. Vertebral hemangioma at T10 is unchanged from priors. No worrisome chest wall lesions. Review of the MIP images confirms the above findings. CT ABDOMEN and PELVIS FINDINGS Hepatobiliary: No focal liver abnormality is seen. No gallstones, gallbladder wall thickening, or biliary dilatation. Interposition of the hepatic flexure anterior to the liver. This configuration is similar to comparison is. Pancreas: Unremarkable. No pancreatic ductal dilatation or surrounding inflammatory changes. Septate cystic structure at the head of the pancreas measures approximately 3.3 by 2.1 cm in size, not significantly changed from comparison MRI. No new pancreatic lesions. No pancreatic ductal dilatation. Spleen: Normal in size without focal abnormality. Adrenals/Urinary Tract: Normal adrenal glands. Few subcentimeter hypoattenuating foci in the right kidney are too small to fully characterize. No suspicious renal lesions are seen. No visible urolithiasis or hydronephrosis. Kidneys  enhance and excrete symmetrically. There is mild asymmetric bladder wall thickening more pronounced anteriorly, similar to priors. Stomach/Bowel: Distal esophagus, stomach and duodenal sweep are unremarkable. No small bowel wall thickening or dilatation. No evidence of obstruction. Cecum is displaced superiorly with the hepatic flexure interposed anterior to the liver. A normal appendix is visualized. There is predominantly distal colonic edematous mural thickening with mucosal hyperemia which is contiguous from the level of the rectum to the transverse colon. Vascular/Lymphatic: Atherosclerotic plaque within the normal caliber aorta. No proximal vascular occlusions are seen. No pathologic abdominopelvic adenopathy. Reproductive: The prostate and seminal vesicles are unremarkable aside from mild prostatomegaly. Other: No abdominopelvic free fluid or free gas. No bowel containing hernias. Mild body wall edema. Musculoskeletal: Multilevel degenerative changes are present in the imaged portions of the spine. Mild grade 1 anterolisthesis of L4 on L5. Additional degenerative changes in the hips and SI joints. No acute osseous abnormality or suspicious osseous lesion. Review of the MIP images confirms the above findings. IMPRESSION: 1. No evidence for acute pulmonary artery embolism. 2. Stable 1.2 cm subpleural nodule in the right upper lobe. Given the stability over time, this is most likely benign. 3. Chronic interstitial lung disease. Findings are categorized as probable UIP per consensus guidelines: Diagnosis of Idiopathic Pulmonary Fibrosis: An Official ATS/ERS/JRS/ALAT Clinical Practice Guideline. [place], Iss 5, [phone], Oct 30 2016. 4. Coronary artery calcifications. 5.  Aortic Atherosclerosis (ICD10-I70.0). 6. Circumferential edematous mural thickening and mucosal hyperemia involving the distal colon extending from the rectum to the transverse colon, consistent with an infectious,  inflammatory or possibly ischemic colitis though no open proximal vascular occlusions are clearly seen. 7. Stable septate cystic structure at the head of the pancreas, likely side-branch IPMN versus pseudocyst. 8. Asymmetric bladder wall thickening, more pronounced anteriorly, similar to priors. Correlate with urinalysis to exclude cystitis but such features could also be seen with chronic outlet obstruction given mild prostatomegaly. Electronically Signed   By: Lovena Le M.D.   On: 03/31/2019 02:53   CT ABDOMEN PELVIS W CONTRAST  Result Date: 03/31/2019 CLINICAL DATA:  Intermittent shortness of breath, lower abdominal pain and fever with diarrhea for 4 days EXAM: CT ANGIOGRAPHY CHEST CT ABDOMEN AND PELVIS WITH CONTRAST TECHNIQUE: Multidetector CT imaging of the chest was performed using the standard protocol during bolus administration of intravenous contrast. Multiplanar CT image reconstructions and MIPs were obtained to evaluate the vascular anatomy. Multidetector CT imaging of the abdomen and pelvis was performed using the standard protocol during bolus administration of intravenous contrast. CONTRAST:  173m OMNIPAQUE IOHEXOL 350 MG/ML SOLN COMPARISON:  CTA chest 03/02/2019 MR abdomen 12/22/2018, CT abdomen pelvis 08/31/2016-2 FINDINGS: CTA CHEST FINDINGS Cardiovascular: Satisfactory opacification the pulmonary arteries to the segmental level. No pulmonary artery filling defects are identified. There is borderline enlargement of the central pulmonary arteries which is similar to comparison CT. Atherosclerotic calcification of the normal caliber thoracic aorta with some moderate tortuosity. Shared origin of brachiocephalic and left common carotid arteries. Proximal great vessels are unremarkable. Overall normal cardiac size with some right atrial ventricular dilatation. Coronary artery calcifications are present. No pericardial effusion. Mediastinum/Nodes: Few scattered low-attenuation partially calcified  mediastinal and hilar nodes are similar to prior. No suspicious adenopathy. No acute abnormality of the trachea or esophagus. Thyroid gland and thoracic inlet are unremarkable. Lungs/Pleura: There is bilateral subpleural reticulation in a basilar predominance with stacked cyst formation in the lung periphery. More dense opacity in the periphery of the right lung base may reflect some pulmonary microlithiasis which is unchanged from multiple priors. No superimposed areas of acute airspace disease are seen. There is a 1.2 cm subpleural nodule in the right upper lobe (4/43). This is unchanged from comparisons as remote is 2018 no new nodules are seen. No pneumothorax or effusion. Musculoskeletal: Multilevel degenerative changes are present in the imaged portions of the spine. No acute osseous abnormality or suspicious osseous lesion. Vertebral hemangioma at T10 is unchanged from priors. No worrisome chest wall lesions. Review of the MIP images confirms the above findings. CT ABDOMEN and PELVIS FINDINGS Hepatobiliary: No focal liver abnormality is seen. No gallstones, gallbladder wall thickening, or biliary dilatation. Interposition of the hepatic flexure anterior to the liver. This configuration is similar to comparison is. Pancreas: Unremarkable. No pancreatic ductal dilatation or surrounding inflammatory changes. Septate cystic structure at the head of the pancreas measures approximately 3.3 by 2.1 cm in size, not significantly changed from comparison MRI. No new pancreatic lesions. No pancreatic ductal dilatation. Spleen: Normal in size without focal abnormality. Adrenals/Urinary Tract: Normal adrenal glands. Few subcentimeter hypoattenuating foci in the right kidney are too small to fully characterize. No suspicious renal lesions are seen. No visible urolithiasis or hydronephrosis. Kidneys enhance and excrete symmetrically. There is mild asymmetric bladder wall thickening more pronounced anteriorly, similar to  priors. Stomach/Bowel: Distal esophagus, stomach and duodenal sweep are unremarkable. No small bowel wall thickening or dilatation. No evidence of obstruction. Cecum is displaced superiorly with the hepatic flexure interposed anterior to the liver. A normal appendix is visualized. There is predominantly distal colonic edematous mural thickening with mucosal hyperemia which is contiguous from the level of the rectum to the transverse colon. Vascular/Lymphatic: Atherosclerotic plaque within the normal caliber aorta. No proximal vascular occlusions are seen. No pathologic abdominopelvic adenopathy. Reproductive: The prostate and seminal vesicles are unremarkable aside from mild prostatomegaly. Other: No abdominopelvic free fluid or free gas. No bowel containing hernias. Mild body wall edema. Musculoskeletal: Multilevel degenerative changes are present in the imaged portions of the spine. Mild grade 1 anterolisthesis of L4 on L5.  Additional degenerative changes in the hips and SI joints. No acute osseous abnormality or suspicious osseous lesion. Review of the MIP images confirms the above findings. IMPRESSION: 1. No evidence for acute pulmonary artery embolism. 2. Stable 1.2 cm subpleural nodule in the right upper lobe. Given the stability over time, this is most likely benign. 3. Chronic interstitial lung disease. Findings are categorized as probable UIP per consensus guidelines: Diagnosis of Idiopathic Pulmonary Fibrosis: An Official ATS/ERS/JRS/ALAT Clinical Practice Guideline. [place], Iss 5, [phone], Oct 30 2016. 4. Coronary artery calcifications. 5.  Aortic Atherosclerosis (ICD10-I70.0). 6. Circumferential edematous mural thickening and mucosal hyperemia involving the distal colon extending from the rectum to the transverse colon, consistent with an infectious, inflammatory or possibly ischemic colitis though no open proximal vascular occlusions are clearly seen. 7. Stable septate cystic  structure at the head of the pancreas, likely side-branch IPMN versus pseudocyst. 8. Asymmetric bladder wall thickening, more pronounced anteriorly, similar to priors. Correlate with urinalysis to exclude cystitis but such features could also be seen with chronic outlet obstruction given mild prostatomegaly. Electronically Signed   By: Lovena Le M.D.   On: 03/31/2019 02:53      Impression / Plan:   Assessment: Active Problems:   Acute colitis   Cerebrovascular accident (CVA) ([place])   Jin Capote is a 86 y.o. y/o male with who was admitted with rectal bleeding and abdominal pain that has greatly improved.  The patient is tolerating a clear liquid diet at this time.  He states that his bleeding has decreased and his abdominal pain is much better today.  The imaging showed colitis extending from the transverse colon to the rectum consistent with infection versus ischemia versus inflammatory.  It is unlikely at 86 years old the patient developed inflammatory bowel disease so ischemia versus infection are most likely.  The patient was on antibiotics recently so C. difficile is definitely a possibility and is pending.  Plan:  I would recommend treating the patient conservatively with antibiotics for a possible infection and tailor the antibiotics if the patient's C. difficile comes back positive.  Otherwise hydration and supportive care is the treatment of choice for ischemic colitis.  I have explained the pathophysiology of the colitis and possible causes to the patient and his wife.  It appears that the patient is already feeling better with less abdominal pain and less bleeding.  Nothing further to do from a GI point of view.  I do not recommend any endoscopic procedures at this time.  I would also agree with keeping the Plavix on this patient since he had a recent stroke and the benefits outweigh the risks since his bleeding has slowed down.  Thank you for involving me in the care of this  patient.      LOS: 0 days   Jimmy Lame, MD  03/31/2019, 1:51 PM Pager [phone] 7am-5pm  Check AMION for 5pm -7am coverage and on weekends   Note: This dictation was prepared with Dragon dictation along with smaller phrase technology. Any transcriptional errors that result from this process are unintentional.

## 2019-03-31 NOTE — Progress Notes (Signed)
[facility] [place] NAME: Jimmy Jimenez    MR#:  [id number]  DATE OF BIRTH:  03/13/1925  SUBJECTIVE:    REVIEW OF SYSTEMS:   ROS Tolerating Diet: Tolerating PT:   DRUG ALLERGIES:   Allergies  Allergen Reactions  . Amlodipine     swelling  . Atenolol     swelling  . Erythromycin     Other reaction(s): Hallucination  . Furosemide     Dizziness   . Hctz [Hydrochlorothiazide]     hyponatremia  . Hydrochlorothiazide W-Triamterene     Other reaction(s): Other (See Comments) Patient stated is causes low sodium Patient stated is causes low sodium Patient stated is causes low sodium  Patient stated is causes low sodium   Patient stated is causes low sodium Patient stated is causes low sodium  . Levofloxacin     Causes sleepiness  . Metoprolol     dizziness  . Micardis [Telmisartan]     Causes swelling  . Penicillins Itching    Has patient had a PCN reaction causing immediate rash, facial/tongue/throat swelling, SOB or lightheadedness with hypotension: No Has patient had a PCN reaction causing severe rash involving mucus membranes or skin necrosis: No Has patient had a PCN reaction that required hospitalization No Has patient had a PCN reaction occurring within the last 10 years: No If all of the above answers are "NO", then may proceed with Cephalosporin use.  . Terazosin     Causes swelling in legs and feet  . Triamterene-Hctz     Patient stated is causes low sodium    VITALS:  Blood pressure (!) 155/50, pulse 75, temperature (!) 97.5 F (36.4 C), temperature source Oral, resp. rate (!) 22, height 5\' 8"  (1.727 m), weight 59.9 kg, SpO2 97 %.  PHYSICAL EXAMINATION:   Physical Exam  GENERAL:  86 y.o.-year-old patient lying in the bed with no acute distress.  EYES: Pupils equal, round, reactive to light and accommodation. No scleral icterus.   HEENT: Head atraumatic, normocephalic. Oropharynx and nasopharynx clear.   NECK:  Supple, no jugular venous distention. No thyroid enlargement, no tenderness.  LUNGS: Normal breath sounds bilaterally, no wheezing, rales, rhonchi. No use of accessory muscles of respiration.  CARDIOVASCULAR: S1, S2 normal. No murmurs, rubs, or gallops.  ABDOMEN: Soft, nontender, nondistended. Bowel sounds present. No organomegaly or mass.  EXTREMITIES: No cyanosis, clubbing or edema b/l.    NEUROLOGIC: Cranial nerves II through XII are intact. No focal Motor or sensory deficits b/l.   PSYCHIATRIC:  patient is alert and oriented x 3.  SKIN: No obvious rash, lesion, or ulcer.   LABORATORY PANEL:  CBC Recent Labs  Lab 03/31/19 0603 03/31/19 0603 03/31/19 1123  WBC 17.1*  --   --   HGB 12.6*   < > 12.9*  HCT 38.8*   < > 38.5*  PLT 414*  --   --    < > = values in this interval not displayed.    Chemistries  Recent Labs  Lab 03/31/19 0020 03/31/19 0020 03/31/19 0603  NA 137   < > 137  K 3.3*   < > 4.0  CL 93*   < > 95*  CO2 31   < > 32  GLUCOSE 200*   < > 135*  BUN 23   < > 21  CREATININE 1.14   < > 1.04  CALCIUM 9.6   < > 9.0  MG  --   --  2.0  AST 25  --   --   ALT 19  --   --   ALKPHOS 86  --   --   BILITOT 1.3*  --   --    < > = values in this interval not displayed.   Cardiac Enzymes No results for input(s): TROPONINI in the last 168 hours. RADIOLOGY:  CT Angio Chest PE W and/or Wo Contrast  Result Date: 03/31/2019 CLINICAL DATA:  Intermittent shortness of breath, lower abdominal pain and fever with diarrhea for 4 days EXAM: CT ANGIOGRAPHY CHEST CT ABDOMEN AND PELVIS WITH CONTRAST TECHNIQUE: Multidetector CT imaging of the chest was performed using the standard protocol during bolus administration of intravenous contrast. Multiplanar CT image reconstructions and MIPs were obtained to evaluate the vascular anatomy. Multidetector CT imaging of the abdomen and pelvis was performed using the standard protocol during bolus administration of intravenous contrast.  CONTRAST:  175mL OMNIPAQUE IOHEXOL 350 MG/ML SOLN COMPARISON:  CTA chest 03/02/2019 MR abdomen 12/22/2018, CT abdomen pelvis 08/31/2016-2 FINDINGS: CTA CHEST FINDINGS Cardiovascular: Satisfactory opacification the pulmonary arteries to the segmental level. No pulmonary artery filling defects are identified. There is borderline enlargement of the central pulmonary arteries which is similar to comparison CT. Atherosclerotic calcification of the normal caliber thoracic aorta with some moderate tortuosity. Shared origin of brachiocephalic and left common carotid arteries. Proximal great vessels are unremarkable. Overall normal cardiac size with some right atrial ventricular dilatation. Coronary artery calcifications are present. No pericardial effusion. Mediastinum/Nodes: Few scattered low-attenuation partially calcified mediastinal and hilar nodes are similar to prior. No suspicious adenopathy. No acute abnormality of the trachea or esophagus. Thyroid gland and thoracic inlet are unremarkable. Lungs/Pleura: There is bilateral subpleural reticulation in a basilar predominance with stacked cyst formation in the lung periphery. More dense opacity in the periphery of the right lung base may reflect some pulmonary microlithiasis which is unchanged from multiple priors. No superimposed areas of acute airspace disease are seen. There is a 1.2 cm subpleural nodule in the right upper lobe (4/43). This is unchanged from comparisons as remote is 2018 no new nodules are seen. No pneumothorax or effusion. Musculoskeletal: Multilevel degenerative changes are present in the imaged portions of the spine. No acute osseous abnormality or suspicious osseous lesion. Vertebral hemangioma at T10 is unchanged from priors. No worrisome chest wall lesions. Review of the MIP images confirms the above findings. CT ABDOMEN and PELVIS FINDINGS Hepatobiliary: No focal liver abnormality is seen. No gallstones, gallbladder wall thickening, or biliary  dilatation. Interposition of the hepatic flexure anterior to the liver. This configuration is similar to comparison is. Pancreas: Unremarkable. No pancreatic ductal dilatation or surrounding inflammatory changes. Septate cystic structure at the head of the pancreas measures approximately 3.3 by 2.1 cm in size, not significantly changed from comparison MRI. No new pancreatic lesions. No pancreatic ductal dilatation. Spleen: Normal in size without focal abnormality. Adrenals/Urinary Tract: Normal adrenal glands. Few subcentimeter hypoattenuating foci in the right kidney are too small to fully characterize. No suspicious renal lesions are seen. No visible urolithiasis or hydronephrosis. Kidneys enhance and excrete symmetrically. There is mild asymmetric bladder wall thickening more pronounced anteriorly, similar to priors. Stomach/Bowel: Distal esophagus, stomach and duodenal sweep are unremarkable. No small bowel wall thickening or dilatation. No evidence of obstruction. Cecum is displaced superiorly with the hepatic flexure interposed anterior to the liver. A normal appendix is visualized. There is predominantly distal colonic edematous mural thickening with mucosal hyperemia which is contiguous from the level of  the rectum to the transverse colon. Vascular/Lymphatic: Atherosclerotic plaque within the normal caliber aorta. No proximal vascular occlusions are seen. No pathologic abdominopelvic adenopathy. Reproductive: The prostate and seminal vesicles are unremarkable aside from mild prostatomegaly. Other: No abdominopelvic free fluid or free gas. No bowel containing hernias. Mild body wall edema. Musculoskeletal: Multilevel degenerative changes are present in the imaged portions of the spine. Mild grade 1 anterolisthesis of L4 on L5. Additional degenerative changes in the hips and SI joints. No acute osseous abnormality or suspicious osseous lesion. Review of the MIP images confirms the above findings. IMPRESSION:  1. No evidence for acute pulmonary artery embolism. 2. Stable 1.2 cm subpleural nodule in the right upper lobe. Given the stability over time, this is most likely benign. 3. Chronic interstitial lung disease. Findings are categorized as probable UIP per consensus guidelines: Diagnosis of Idiopathic Pulmonary Fibrosis: An Official ATS/ERS/JRS/ALAT Clinical Practice Guideline. [place], Iss 5, [phone], Oct 30 2016. 4. Coronary artery calcifications. 5.  Aortic Atherosclerosis (ICD10-I70.0). 6. Circumferential edematous mural thickening and mucosal hyperemia involving the distal colon extending from the rectum to the transverse colon, consistent with an infectious, inflammatory or possibly ischemic colitis though no open proximal vascular occlusions are clearly seen. 7. Stable septate cystic structure at the head of the pancreas, likely side-branch IPMN versus pseudocyst. 8. Asymmetric bladder wall thickening, more pronounced anteriorly, similar to priors. Correlate with urinalysis to exclude cystitis but such features could also be seen with chronic outlet obstruction given mild prostatomegaly. Electronically Signed   By: Lovena Le M.D.   On: 03/31/2019 02:53   CT ABDOMEN PELVIS W CONTRAST  Result Date: 03/31/2019 CLINICAL DATA:  Intermittent shortness of breath, lower abdominal pain and fever with diarrhea for 4 days EXAM: CT ANGIOGRAPHY CHEST CT ABDOMEN AND PELVIS WITH CONTRAST TECHNIQUE: Multidetector CT imaging of the chest was performed using the standard protocol during bolus administration of intravenous contrast. Multiplanar CT image reconstructions and MIPs were obtained to evaluate the vascular anatomy. Multidetector CT imaging of the abdomen and pelvis was performed using the standard protocol during bolus administration of intravenous contrast. CONTRAST:  127mL OMNIPAQUE IOHEXOL 350 MG/ML SOLN COMPARISON:  CTA chest 03/02/2019 MR abdomen 12/22/2018, CT abdomen pelvis  08/31/2016-2 FINDINGS: CTA CHEST FINDINGS Cardiovascular: Satisfactory opacification the pulmonary arteries to the segmental level. No pulmonary artery filling defects are identified. There is borderline enlargement of the central pulmonary arteries which is similar to comparison CT. Atherosclerotic calcification of the normal caliber thoracic aorta with some moderate tortuosity. Shared origin of brachiocephalic and left common carotid arteries. Proximal great vessels are unremarkable. Overall normal cardiac size with some right atrial ventricular dilatation. Coronary artery calcifications are present. No pericardial effusion. Mediastinum/Nodes: Few scattered low-attenuation partially calcified mediastinal and hilar nodes are similar to prior. No suspicious adenopathy. No acute abnormality of the trachea or esophagus. Thyroid gland and thoracic inlet are unremarkable. Lungs/Pleura: There is bilateral subpleural reticulation in a basilar predominance with stacked cyst formation in the lung periphery. More dense opacity in the periphery of the right lung base may reflect some pulmonary microlithiasis which is unchanged from multiple priors. No superimposed areas of acute airspace disease are seen. There is a 1.2 cm subpleural nodule in the right upper lobe (4/43). This is unchanged from comparisons as remote is 2018 no new nodules are seen. No pneumothorax or effusion. Musculoskeletal: Multilevel degenerative changes are present in the imaged portions of the spine. No acute osseous abnormality or suspicious osseous  lesion. Vertebral hemangioma at T10 is unchanged from priors. No worrisome chest wall lesions. Review of the MIP images confirms the above findings. CT ABDOMEN and PELVIS FINDINGS Hepatobiliary: No focal liver abnormality is seen. No gallstones, gallbladder wall thickening, or biliary dilatation. Interposition of the hepatic flexure anterior to the liver. This configuration is similar to comparison is.  Pancreas: Unremarkable. No pancreatic ductal dilatation or surrounding inflammatory changes. Septate cystic structure at the head of the pancreas measures approximately 3.3 by 2.1 cm in size, not significantly changed from comparison MRI. No new pancreatic lesions. No pancreatic ductal dilatation. Spleen: Normal in size without focal abnormality. Adrenals/Urinary Tract: Normal adrenal glands. Few subcentimeter hypoattenuating foci in the right kidney are too small to fully characterize. No suspicious renal lesions are seen. No visible urolithiasis or hydronephrosis. Kidneys enhance and excrete symmetrically. There is mild asymmetric bladder wall thickening more pronounced anteriorly, similar to priors. Stomach/Bowel: Distal esophagus, stomach and duodenal sweep are unremarkable. No small bowel wall thickening or dilatation. No evidence of obstruction. Cecum is displaced superiorly with the hepatic flexure interposed anterior to the liver. A normal appendix is visualized. There is predominantly distal colonic edematous mural thickening with mucosal hyperemia which is contiguous from the level of the rectum to the transverse colon. Vascular/Lymphatic: Atherosclerotic plaque within the normal caliber aorta. No proximal vascular occlusions are seen. No pathologic abdominopelvic adenopathy. Reproductive: The prostate and seminal vesicles are unremarkable aside from mild prostatomegaly. Other: No abdominopelvic free fluid or free gas. No bowel containing hernias. Mild body wall edema. Musculoskeletal: Multilevel degenerative changes are present in the imaged portions of the spine. Mild grade 1 anterolisthesis of L4 on L5. Additional degenerative changes in the hips and SI joints. No acute osseous abnormality or suspicious osseous lesion. Review of the MIP images confirms the above findings. IMPRESSION: 1. No evidence for acute pulmonary artery embolism. 2. Stable 1.2 cm subpleural nodule in the right upper lobe. Given the  stability over time, this is most likely benign. 3. Chronic interstitial lung disease. Findings are categorized as probable UIP per consensus guidelines: Diagnosis of Idiopathic Pulmonary Fibrosis: An Official ATS/ERS/JRS/ALAT Clinical Practice Guideline. [place], Iss 5, [id number], Oct 30 2016. 4. Coronary artery calcifications. 5.  Aortic Atherosclerosis (ICD10-I70.0). 6. Circumferential edematous mural thickening and mucosal hyperemia involving the distal colon extending from the rectum to the transverse colon, consistent with an infectious, inflammatory or possibly ischemic colitis though no open proximal vascular occlusions are clearly seen. 7. Stable septate cystic structure at the head of the pancreas, likely side-branch IPMN versus pseudocyst. 8. Asymmetric bladder wall thickening, more pronounced anteriorly, similar to priors. Correlate with urinalysis to exclude cystitis but such features could also be seen with chronic outlet obstruction given mild prostatomegaly. Electronically Signed   By: Lovena Le M.D.   On: 03/31/2019 02:53   ASSESSMENT AND PLAN:  Jimmy Jimenez  is a 86 y.o. African-American male with a known history of hypertension, dyslipidemia and thrombocytosis, who presented to the emergency room with onset of generalized abdominal pain with associated diarrhea since yesterday which has been loose and watery with minimal amount of blood once without melena.  No known history of hemorrhoids.  1.  Acute colitis, most likely infectious given significant leukocytosis.  -Sepsis documented on admission--resolved now -  continued IV Rocephin and Flagyl.  -prn Pain management  -  Given the minimal amount of bleeding that could be related to ischemic colitis will follow serial hemoglobin and hematocrits.  -  if continues to have significant bleeding then will hold plavix -Stool for c diff (recently in hosp for CVA) - GI consultation by Dr. Allen Norris.   2.  Hypokalemia.   -K 4.0 - This like secondary to recurrent diarrhea. - magnesium level 2.0  3.  Hypertension.  Continue hydralazine, lisinopril  4.  Thrombocytosis. continue hydroxyurea.  5.  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  DVT prophylaxis.  SCDs.   Procedures: Family communication :left message for wife Consults :GI Discharge Disposition :Likely Home CODE STATUS: Partial (No to ventilator) DVT Prophylaxis : SCD Barriers to discharge: ongoing treatment of Colitis  TOTAL TIME TAKING CARE OF THIS PATIENT: *35* minutes.  >50% time spent on counselling and coordination of care  Note: This dictation was prepared with Dragon dictation along with smaller phrase technology. Any transcriptional errors that result from this process are unintentional.  Fritzi Mandes M.D    [facility]   CC: Primary care physician; Crecencio Mc, MDPatient ID: Jimmy Jimenez, male   DOB: 03/13/1925, 86 y.o.   MRN: [id number]

## 2019-03-31 NOTE — ED Provider Notes (Signed)
[facility] Emergency Department Provider Note  ____________________________________________   First MD Initiated Contact with Patient 03/31/19 [phone]     (approximate)  I have reviewed the triage vital signs and the nursing notes.   HISTORY  Chief Complaint Abdominal Pain    HPI Jimmy Jimenez is a 86 y.o. male with hypertension, hyperlipidemia, elevated platelets who comes in for abdominal pain.  Patient states the abdominal pain started today.  The abdominal pain is in his lower abdomen, currently severe at 8 out of 10, nothing makes it better, nothing makes it worse.  States that has not been eating or drinking as well.  States that he also has some shortness of breath.  He is not sure if it is from the pain.  He denies any pain in his testicles.  States that he did have a nosebleed out of his left nostril 2 days ago.  No longer bleeding.  Patient has had some diarrhea.  According to wife did have some recent antibiotics but she is unsure what kind          Past Medical History:  Diagnosis Date   Colon polyps    Essential thrombocytosis ([place])    Essential thrombocytosis ([place])    Hyperlipidemia    Hypertension    Lung nodule 12/2004   found on CXR   Osteoporosis     Patient Active Problem List   Diagnosis Date Noted   Hospital discharge follow-up 03/11/2019   Pseudoaneurysm ([place]) R groin 03/08/2019   Leukocytosis 03/08/2019   Acute blood loss anemia 03/08/2019   Mild malnutrition ([place]) 03/08/2019   Pressure injury of skin 02/54/2706   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 03/04/2019   GERD (gastroesophageal reflux disease) 03/02/2019   Chronic diastolic CHF (congestive heart failure) ([place]) 03/02/2019   Elevated troponin 03/02/2019   Sacral decubitus ulcer, stage II ([place]) 02/19/2019   Chest pain 09/30/2018   Rhinitis, chronic 11/20/2017   Mass of pancreas  04/23/2016   B12 deficiency 08/17/2015   Low serum testosterone 05/20/2015   Chronic constipation 03/31/2015   Pulmonary fibrosis ([place]) 12/12/2014   Abnormal weight loss 08/20/2014   Medicare annual wellness visit, subsequent 09/28/2013   Statin intolerance 09/26/2013   Pulmonary hypertension, mild ([place]) 12/14/2012   Solitary pulmonary nodule 10/16/2012   Heart failure, diastolic, due to HTN ([place]) 10/16/2012   Bradycardia, sinus 09/02/2012   Hyponatremia 12/01/2011   Edema of both legs 10/30/2011   Essential thrombocytosis ([place])    Hyperlipidemia    Essential hypertension    Osteoporosis     Past Surgical History:  Procedure Laterality Date   BONE MARROW BIOPSY  01/18/07   COLONOSCOPY  2007, 2010   Dr Allen Norris   IR CT HEAD LTD  03/04/2019   IR PERCUTANEOUS ART THROMBECTOMY/INFUSION INTRACRANIAL INC DIAG ANGIO  03/04/2019   IR RADIOLOGIST EVAL & MGMT  03/20/2019   IR US GUIDE BX ASP/DRAIN  03/07/2019   IR US GUIDE VASC ACCESS RIGHT  03/04/2019   LOOP RECORDER INSERTION N/A 03/07/2019   Procedure: LOOP RECORDER INSERTION;  Surgeon: Constance Haw, MD;  Location: [facility];  Service: Cardiovascular;  Laterality: N/A;   NASAL SINUS SURGERY  06/11/05   RADIOLOGY WITH ANESTHESIA N/A 03/03/2019   Procedure: IR WITH ANESTHESIA;  Surgeon: Luanne Bras, MD;  Location: [place];  Service: Radiology;  Laterality: N/A;    Prior to Admission medications   Medication Sig Start  Date End Date Taking? Authorizing Provider  albuterol (VENTOLIN HFA) 108 (90 Base) MCG/ACT inhaler Inhale 2 puffs into the lungs every 6 (six) hours as needed for wheezing or shortness of breath. Patient not taking: Reported on 03/22/2019 03/03/19   Loletha Grayer, MD  atorvastatin (LIPITOR) 10 MG tablet Take 1 tablet (10 mg total) by mouth daily at 6 PM. 03/08/19   Donzetta Starch, NP  azelastine (ASTELIN) 0.1 % nasal spray Place 2 sprays into both nostrils 2 (two) times daily.  02/10/19    [provider]  benzonatate (TESSALON) 200 MG capsule Take 1 capsule (200 mg total) by mouth 2 (two) times daily as needed for cough. 01/04/19   Crecencio Mc, MD  Calcium Citrate-Vitamin D (CITRACAL + D PO) Take 2 tablets by mouth daily. Take 2 by mouth daily, calcium 630 and vitamin D 500 each.     [provider]  Cholecalciferol (VITAMIN D3) 10 MCG (400 UNIT) CAPS Take 500 Units by mouth daily. Take 2 by mouth daily     [provider]  clopidogrel (PLAVIX) 75 MG tablet Take 1 tablet (75 mg total) by mouth daily. 03/09/19   Donzetta Starch, NP  clotrimazole-betamethasone (LOTRISONE) cream Apply topically 2 (two) times daily. 11/28/18   Crecencio Mc, MD  Cyanocobalamin (B-12 COMPLIANCE INJECTION) 1000 MCG/ML KIT Inject 1,000 mcg as directed every 30 (thirty) days.     [provider]  cyanocobalamin 1000 MCG tablet Take 5,000 mcg by mouth daily.    [provider]  docusate sodium (COLACE) 100 MG capsule Take 100 mg by mouth 2 (two) times daily as needed for mild constipation.    [provider]  esomeprazole (NEXIUM) 40 MG capsule TAKE 1 CAPSULE BY MOUTH ONCE DAILY AT NOON 11/27/18   Crecencio Mc, MD  feeding supplement, ENSURE ENLIVE, (ENSURE ENLIVE) LIQD Take 237 mLs by mouth 2 (two) times daily between meals. 03/08/19   Donzetta Starch, NP  fluticasone (FLONASE) 50 MCG/ACT nasal spray Use 1-2 sprays in each nostril daily as needed 03/01/19   [provider]  furosemide (LASIX) 20 MG tablet Take 1 tablet (20 mg total) by mouth daily. 03/22/19   Crecencio Mc, MD  hydrALAZINE (APRESOLINE) 50 MG tablet Take 50 mg by mouth 3 (three) times daily.    [provider]  hydroxyurea (HYDREA) 500 MG capsule TAKE 1 CAPSULE BY MOUTH EVERY OTHER DAY MAY TAKE WITH FOOD TO MINIMIZE GI SIDE EFFECTS. 08/08/18   Cammie Sickle, MD  Infant Care Products ([place]) CREA Apply 1 application topically as needed (after each bowel movement  for barrier for  pressure ulcer.). 02/15/19   Crecencio Mc, MD  lisinopril (ZESTRIL) 40 MG tablet Take 1 tablet (40 mg total) by mouth at bedtime. 03/22/19   Crecencio Mc, MD  testosterone cypionate (DEPOTESTOSTERONE CYPIONATE) 200 MG/ML injection INJECT 0.25 ML INTRAMUSCULARLY EVERY 14 DAYS AS DIRECTED 02/16/19   Crecencio Mc, MD    Allergies Amlodipine, Atenolol, Erythromycin, Furosemide, Hctz [hydrochlorothiazide], Hydrochlorothiazide w-triamterene, Levofloxacin, Metoprolol, Micardis [telmisartan], Penicillins, Terazosin, and Triamterene-hctz  Family History  Problem Relation Age of Onset   Colon cancer Mother        colon age 71's   Peripheral vascular disease Father    Colon cancer Brother        colon age 67's   Diabetes Other    Prostate cancer Neg Hx    Bladder Cancer Neg Hx    Kidney cancer  Neg Hx     Social History Social History   Tobacco Use   Smoking status: Never Smoker   Smokeless tobacco: Never Used  Substance Use Topics   Alcohol use: No   Drug use: No      Review of Systems Constitutional: No fever/chills Eyes: No visual changes. ENT: No sore throat.  Nosebleed Cardiovascular: Denies chest pain. Respiratory: Positive shortness of breath Gastrointestinal: Positive abdominal pain.  No nausea, no vomiting.  Positive diarrhea no constipation. Genitourinary: Negative for dysuria. Musculoskeletal: Negative for back pain. Skin: Negative for rash. Neurological: Negative for headaches, focal weakness or numbness. All other ROS negative ____________________________________________   PHYSICAL EXAM:  VITAL SIGNS: ED Triage Vitals  Enc Vitals Group     BP 03/31/19 0011 (!) 163/64     Pulse Rate 03/31/19 0011 86     Resp 03/31/19 0011 (!) 22     Temp 03/31/19 0011 98 F (36.7 C)     Temp Source 03/31/19 0011 Oral     SpO2 03/31/19 0011 96 %     Weight 03/31/19 0008 132 lb (59.9 kg)     Height 03/31/19 0008 5' 8"  (1.727 m)     Head  Circumference --      Peak Flow --      Pain Score --      Pain Loc --      Pain Edu? --      Excl. in [place]? --     Constitutional: Alert and oriented.  Tachypneic Eyes: Conjunctivae are normal. EOMI. Head: Atraumatic. Nose: No congestion/rhinnorhea. Mouth/Throat: Mucous membranes are moist.   Neck: No stridor. Trachea Midline. FROM Cardiovascular: Normal rate, regular rhythm. Grossly normal heart sounds.  Good peripheral circulation. Respiratory: Tachypneic with increased work of breathing..  No retractions. Lungs CTAB. Gastrointestinal: Tender in the lower abdomen with no rebound or guarding.  No distention. No abdominal bruits.  Musculoskeletal: No lower extremity tenderness nor edema.  No joint effusions. Neurologic:  Normal speech and language. No gross focal neurologic deficits are appreciated.  Skin:  Skin is warm, dry and intact. No rash noted. Psychiatric: Mood and affect are normal. Speech and behavior are normal. GU: Deferred   ____________________________________________   LABS (all labs ordered are listed, but only abnormal results are displayed)  Labs Reviewed  COMPREHENSIVE METABOLIC PANEL - Abnormal; Notable for the following components:      Result Value   Potassium 3.3 (*)    Chloride 93 (*)    Glucose, Bld 200 (*)    Total Bilirubin 1.3 (*)    GFR calc non Af Amer 56 (*)    All other components within normal limits  CBC - Abnormal; Notable for the following components:   WBC 17.1 (*)    RBC 4.18 (*)    MCV 101.0 (*)    Platelets 477 (*)    All other components within normal limits  URINALYSIS, COMPLETE (UACMP) WITH MICROSCOPIC - Abnormal; Notable for the following components:   Color, Urine YELLOW (*)    APPearance CLEAR (*)    Specific Gravity, Urine >1.046 (*)    All other components within normal limits  TROPONIN I (HIGH SENSITIVITY) - Abnormal; Notable for the following components:   Troponin I (High Sensitivity) 18 (*)    All other components  within normal limits  RESPIRATORY PANEL BY RT PCR (FLU A&B, COVID)  GI PATHOGEN PANEL BY PCR, STOOL  C DIFFICILE QUICK SCREEN W PCR REFLEX  LIPASE, BLOOD  LACTIC  ACID, PLASMA  LACTIC ACID, PLASMA  TROPONIN I (HIGH SENSITIVITY)   ____________________________________________   ED ECG REPORT I, Vanessa Veteran, the attending physician, personally viewed and interpreted this ECG.  EKG is sinus rate of 66, no ST elevation, no T wave inversions, normal intervals somewhat of a wandering baseline.,  Occasional PAC ____________________________________________  RADIOLOGY  Official radiology report(s): CT Angio Chest PE W and/or Wo Contrast  Result Date: 03/31/2019 CLINICAL DATA:  Intermittent shortness of breath, lower abdominal pain and fever with diarrhea for 4 days EXAM: CT ANGIOGRAPHY CHEST CT ABDOMEN AND PELVIS WITH CONTRAST TECHNIQUE: Multidetector CT imaging of the chest was performed using the standard protocol during bolus administration of intravenous contrast. Multiplanar CT image reconstructions and MIPs were obtained to evaluate the vascular anatomy. Multidetector CT imaging of the abdomen and pelvis was performed using the standard protocol during bolus administration of intravenous contrast. CONTRAST:  160m OMNIPAQUE IOHEXOL 350 MG/ML SOLN COMPARISON:  CTA chest 03/02/2019 MR abdomen 12/22/2018, CT abdomen pelvis 08/31/2016-2 FINDINGS: CTA CHEST FINDINGS Cardiovascular: Satisfactory opacification the pulmonary arteries to the segmental level. No pulmonary artery filling defects are identified. There is borderline enlargement of the central pulmonary arteries which is similar to comparison CT. Atherosclerotic calcification of the normal caliber thoracic aorta with some moderate tortuosity. Shared origin of brachiocephalic and left common carotid arteries. Proximal great vessels are unremarkable. Overall normal cardiac size with some right atrial ventricular dilatation. Coronary artery  calcifications are present. No pericardial effusion. Mediastinum/Nodes: Few scattered low-attenuation partially calcified mediastinal and hilar nodes are similar to prior. No suspicious adenopathy. No acute abnormality of the trachea or esophagus. Thyroid gland and thoracic inlet are unremarkable. Lungs/Pleura: There is bilateral subpleural reticulation in a basilar predominance with stacked cyst formation in the lung periphery. More dense opacity in the periphery of the right lung base may reflect some pulmonary microlithiasis which is unchanged from multiple priors. No superimposed areas of acute airspace disease are seen. There is a 1.2 cm subpleural nodule in the right upper lobe (4/43). This is unchanged from comparisons as remote is 2018 no new nodules are seen. No pneumothorax or effusion. Musculoskeletal: Multilevel degenerative changes are present in the imaged portions of the spine. No acute osseous abnormality or suspicious osseous lesion. Vertebral hemangioma at T10 is unchanged from priors. No worrisome chest wall lesions. Review of the MIP images confirms the above findings. CT ABDOMEN and PELVIS FINDINGS Hepatobiliary: No focal liver abnormality is seen. No gallstones, gallbladder wall thickening, or biliary dilatation. Interposition of the hepatic flexure anterior to the liver. This configuration is similar to comparison is. Pancreas: Unremarkable. No pancreatic ductal dilatation or surrounding inflammatory changes. Septate cystic structure at the head of the pancreas measures approximately 3.3 by 2.1 cm in size, not significantly changed from comparison MRI. No new pancreatic lesions. No pancreatic ductal dilatation. Spleen: Normal in size without focal abnormality. Adrenals/Urinary Tract: Normal adrenal glands. Few subcentimeter hypoattenuating foci in the right kidney are too small to fully characterize. No suspicious renal lesions are seen. No visible urolithiasis or hydronephrosis. Kidneys  enhance and excrete symmetrically. There is mild asymmetric bladder wall thickening more pronounced anteriorly, similar to priors. Stomach/Bowel: Distal esophagus, stomach and duodenal sweep are unremarkable. No small bowel wall thickening or dilatation. No evidence of obstruction. Cecum is displaced superiorly with the hepatic flexure interposed anterior to the liver. A normal appendix is visualized. There is predominantly distal colonic edematous mural thickening with mucosal hyperemia which is contiguous from the level of the  rectum to the transverse colon. Vascular/Lymphatic: Atherosclerotic plaque within the normal caliber aorta. No proximal vascular occlusions are seen. No pathologic abdominopelvic adenopathy. Reproductive: The prostate and seminal vesicles are unremarkable aside from mild prostatomegaly. Other: No abdominopelvic free fluid or free gas. No bowel containing hernias. Mild body wall edema. Musculoskeletal: Multilevel degenerative changes are present in the imaged portions of the spine. Mild grade 1 anterolisthesis of L4 on L5. Additional degenerative changes in the hips and SI joints. No acute osseous abnormality or suspicious osseous lesion. Review of the MIP images confirms the above findings. IMPRESSION: 1. No evidence for acute pulmonary artery embolism. 2. Stable 1.2 cm subpleural nodule in the right upper lobe. Given the stability over time, this is most likely benign. 3. Chronic interstitial lung disease. Findings are categorized as probable UIP per consensus guidelines: Diagnosis of Idiopathic Pulmonary Fibrosis: An Official ATS/ERS/JRS/ALAT Clinical Practice Guideline. [place], Iss 5, [phone], Oct 30 2016. 4. Coronary artery calcifications. 5.  Aortic Atherosclerosis (ICD10-I70.0). 6. Circumferential edematous mural thickening and mucosal hyperemia involving the distal colon extending from the rectum to the transverse colon, consistent with an infectious,  inflammatory or possibly ischemic colitis though no open proximal vascular occlusions are clearly seen. 7. Stable septate cystic structure at the head of the pancreas, likely side-branch IPMN versus pseudocyst. 8. Asymmetric bladder wall thickening, more pronounced anteriorly, similar to priors. Correlate with urinalysis to exclude cystitis but such features could also be seen with chronic outlet obstruction given mild prostatomegaly. Electronically Signed   By: Lovena Le M.D.   On: 03/31/2019 02:53   CT ABDOMEN PELVIS W CONTRAST  Result Date: 03/31/2019 CLINICAL DATA:  Intermittent shortness of breath, lower abdominal pain and fever with diarrhea for 4 days EXAM: CT ANGIOGRAPHY CHEST CT ABDOMEN AND PELVIS WITH CONTRAST TECHNIQUE: Multidetector CT imaging of the chest was performed using the standard protocol during bolus administration of intravenous contrast. Multiplanar CT image reconstructions and MIPs were obtained to evaluate the vascular anatomy. Multidetector CT imaging of the abdomen and pelvis was performed using the standard protocol during bolus administration of intravenous contrast. CONTRAST:  164m OMNIPAQUE IOHEXOL 350 MG/ML SOLN COMPARISON:  CTA chest 03/02/2019 MR abdomen 12/22/2018, CT abdomen pelvis 08/31/2016-2 FINDINGS: CTA CHEST FINDINGS Cardiovascular: Satisfactory opacification the pulmonary arteries to the segmental level. No pulmonary artery filling defects are identified. There is borderline enlargement of the central pulmonary arteries which is similar to comparison CT. Atherosclerotic calcification of the normal caliber thoracic aorta with some moderate tortuosity. Shared origin of brachiocephalic and left common carotid arteries. Proximal great vessels are unremarkable. Overall normal cardiac size with some right atrial ventricular dilatation. Coronary artery calcifications are present. No pericardial effusion. Mediastinum/Nodes: Few scattered low-attenuation partially calcified  mediastinal and hilar nodes are similar to prior. No suspicious adenopathy. No acute abnormality of the trachea or esophagus. Thyroid gland and thoracic inlet are unremarkable. Lungs/Pleura: There is bilateral subpleural reticulation in a basilar predominance with stacked cyst formation in the lung periphery. More dense opacity in the periphery of the right lung base may reflect some pulmonary microlithiasis which is unchanged from multiple priors. No superimposed areas of acute airspace disease are seen. There is a 1.2 cm subpleural nodule in the right upper lobe (4/43). This is unchanged from comparisons as remote is 2018 no new nodules are seen. No pneumothorax or effusion. Musculoskeletal: Multilevel degenerative changes are present in the imaged portions of the spine. No acute osseous abnormality or suspicious osseous lesion.  Vertebral hemangioma at T10 is unchanged from priors. No worrisome chest wall lesions. Review of the MIP images confirms the above findings. CT ABDOMEN and PELVIS FINDINGS Hepatobiliary: No focal liver abnormality is seen. No gallstones, gallbladder wall thickening, or biliary dilatation. Interposition of the hepatic flexure anterior to the liver. This configuration is similar to comparison is. Pancreas: Unremarkable. No pancreatic ductal dilatation or surrounding inflammatory changes. Septate cystic structure at the head of the pancreas measures approximately 3.3 by 2.1 cm in size, not significantly changed from comparison MRI. No new pancreatic lesions. No pancreatic ductal dilatation. Spleen: Normal in size without focal abnormality. Adrenals/Urinary Tract: Normal adrenal glands. Few subcentimeter hypoattenuating foci in the right kidney are too small to fully characterize. No suspicious renal lesions are seen. No visible urolithiasis or hydronephrosis. Kidneys enhance and excrete symmetrically. There is mild asymmetric bladder wall thickening more pronounced anteriorly, similar to  priors. Stomach/Bowel: Distal esophagus, stomach and duodenal sweep are unremarkable. No small bowel wall thickening or dilatation. No evidence of obstruction. Cecum is displaced superiorly with the hepatic flexure interposed anterior to the liver. A normal appendix is visualized. There is predominantly distal colonic edematous mural thickening with mucosal hyperemia which is contiguous from the level of the rectum to the transverse colon. Vascular/Lymphatic: Atherosclerotic plaque within the normal caliber aorta. No proximal vascular occlusions are seen. No pathologic abdominopelvic adenopathy. Reproductive: The prostate and seminal vesicles are unremarkable aside from mild prostatomegaly. Other: No abdominopelvic free fluid or free gas. No bowel containing hernias. Mild body wall edema. Musculoskeletal: Multilevel degenerative changes are present in the imaged portions of the spine. Mild grade 1 anterolisthesis of L4 on L5. Additional degenerative changes in the hips and SI joints. No acute osseous abnormality or suspicious osseous lesion. Review of the MIP images confirms the above findings. IMPRESSION: 1. No evidence for acute pulmonary artery embolism. 2. Stable 1.2 cm subpleural nodule in the right upper lobe. Given the stability over time, this is most likely benign. 3. Chronic interstitial lung disease. Findings are categorized as probable UIP per consensus guidelines: Diagnosis of Idiopathic Pulmonary Fibrosis: An Official ATS/ERS/JRS/ALAT Clinical Practice Guideline. [place], Iss 5, [phone], Oct 30 2016. 4. Coronary artery calcifications. 5.  Aortic Atherosclerosis (ICD10-I70.0). 6. Circumferential edematous mural thickening and mucosal hyperemia involving the distal colon extending from the rectum to the transverse colon, consistent with an infectious, inflammatory or possibly ischemic colitis though no open proximal vascular occlusions are clearly seen. 7. Stable septate cystic  structure at the head of the pancreas, likely side-branch IPMN versus pseudocyst. 8. Asymmetric bladder wall thickening, more pronounced anteriorly, similar to priors. Correlate with urinalysis to exclude cystitis but such features could also be seen with chronic outlet obstruction given mild prostatomegaly. Electronically Signed   By: Lovena Le M.D.   On: 03/31/2019 02:53    ____________________________________________   PROCEDURES  Procedure(s) performed (including Critical Care):  Procedures   ____________________________________________   INITIAL IMPRESSION / ASSESSMENT AND PLAN / ED COURSE  Jimmy Jimenez was evaluated in Emergency Department on 03/31/2019 for the symptoms described in the history of present illness. He was evaluated in the context of the global COVID-19 pandemic, which necessitated consideration that the patient might be at risk for infection with the SARS-CoV-2 virus that causes COVID-19. Institutional protocols and algorithms that pertain to the evaluation of patients at risk for COVID-19 are in a state of rapid change based on information released by regulatory bodies including the CDC and  federal and state organizations. These policies and algorithms were followed during the patient's care in the ED.    Patient is a 86 year old coming from home with abdominal pain but also notably tachypneic.  Unclear if this is just from the pain or if there is actually a lung process going on as well.  We will get labs to evaluate for electrolyte abnormalities, AKI, pancreatitis, cholecystitis.  However given the pain is in the lower abdomen patient will require CT scan to evaluate for SBO, diverticulitis, perforation.  Will also get CT PE given patient's significant tachypnea to rule out pulmonary embolism.  Patient is no longer having a nosebleed.  Patient's white count is elevated at 17 making infection concerning.  CT scan was concerning for colitis.  Given the recent  antibiotic use this could be secondary to C. difficile.  Will send stool studies.  UA without evidence of UTI.  Given patient's allergy we will start patient on ceftriaxone and Flagyl.  Patient is not bacteremic or septic.  Patient's repeat abdominal exam continues to be reassuring with only slight tenderness.  No evidence of peritonitis.  Will get lactate to evaluate for ischemic colitis but I have lower suspicion.  Discussed with hospital team and will admit patient      ____________________________________________   FINAL CLINICAL IMPRESSION(S) / ED DIAGNOSES   Final diagnoses:  Colitis      MEDICATIONS GIVEN DURING THIS VISIT:  Medications  sodium chloride flush (NS) 0.9 % injection 3 mL (has no administration in time range)  cefTRIAXone (ROCEPHIN) 1 g in sodium chloride 0.9 % 100 mL IVPB (has no administration in time range)  metroNIDAZOLE (FLAGYL) IVPB 500 mg (has no administration in time range)  fentaNYL (SUBLIMAZE) injection 25 mcg (25 mcg Intravenous Given 03/31/19 0101)  ondansetron (ZOFRAN) injection 4 mg (4 mg Intravenous Given 03/31/19 0101)  oxymetazoline (AFRIN) 0.05 % nasal spray 1 spray (1 spray Each Nare Given 03/31/19 0106)  iohexol (OMNIPAQUE) 350 MG/ML injection 100 mL (100 mLs Intravenous Contrast Given 03/31/19 0154)     ED Discharge Orders    None       Note:  This document was prepared using Dragon voice recognition software and may include unintentional dictation errors.   Vanessa [place], MD 03/31/19 [phone]

## 2019-04-01 DIAGNOSIS — D490 Neoplasm of unspecified behavior of digestive system: Secondary | ICD-10-CM

## 2019-04-01 NOTE — Care Plan (Signed)
Patient identified as high risk for 12 month mortality. Chart reviewed in detail. 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 He has a marked declined in terms of his functional status.   Patient does not have documents in the [place] but given his advanced age and comorbidities would benefit from a goals of care discussion and completion of a MOST form. Will recommend discussion and completion of documents with PCP and if appropriate please consider inpatient palliative consultation with outpatient follow.  Lane Hacker, DO Palliative Medicine

## 2019-04-01 NOTE — Progress Notes (Signed)
Jimmy Jimenez NAME: Jimmy Jimenez    MR#:  [id number]  DATE OF BIRTH:  03/13/1925  SUBJECTIVE:  patient awake and alert. Denies any abdominal pain. Wants to try full liquid diet. He says he likes oatmeal. Wife Mrs. Star in the room. She goes over will patient's med list  no fever nausea vomiting  No diarrhea reported by RN and patient REVIEW OF SYSTEMS:   Review of Systems  Constitutional: Positive for weight loss. Negative for chills and fever.  HENT: Negative for ear discharge, ear pain and nosebleeds.   Eyes: Negative for blurred vision, pain and discharge.  Respiratory: Negative for sputum production, shortness of breath, wheezing and stridor.   Cardiovascular: Negative for chest pain, palpitations, orthopnea and PND.  Gastrointestinal: Negative for abdominal pain, diarrhea, nausea and vomiting.  Genitourinary: Negative for frequency and urgency.  Musculoskeletal: Negative for back pain and joint pain.  Neurological: Positive for weakness. Negative for sensory change, speech change and focal weakness.  Psychiatric/Behavioral: Negative for depression and hallucinations. The patient is not nervous/anxious.    Tolerating Diet:yes Tolerating PT: pending  DRUG ALLERGIES:   Allergies  Allergen Reactions  . Amlodipine     swelling  . Atenolol     swelling  . Erythromycin     Other reaction(s): Hallucination  . Furosemide     Dizziness   . Hctz [Hydrochlorothiazide]     hyponatremia  . Hydrochlorothiazide W-Triamterene     Other reaction(s): Other (See Comments) Patient stated is causes low sodium Patient stated is causes low sodium Patient stated is causes low sodium  Patient stated is causes low sodium   Patient stated is causes low sodium Patient stated is causes low sodium  . Levofloxacin     Causes sleepiness  . Metoprolol     dizziness  . Micardis [Telmisartan]     Causes swelling  . Penicillins Itching     Has patient had a PCN reaction causing immediate rash, facial/tongue/throat swelling, SOB or lightheadedness with hypotension: No Has patient had a PCN reaction causing severe rash involving mucus membranes or skin necrosis: No Has patient had a PCN reaction that required hospitalization No Has patient had a PCN reaction occurring within the last 10 years: No If all of the above answers are "NO", then may proceed with Cephalosporin use.  . Terazosin     Causes swelling in legs and feet  . Triamterene-Hctz     Patient stated is causes low sodium    VITALS:  Blood pressure (!) 140/54, pulse 65, temperature 98 F (36.7 C), temperature source Oral, resp. rate 18, height 5' 8"  (1.727 m), weight 59.9 kg, SpO2 98 %.  PHYSICAL EXAMINATION:   Physical Exam  GENERAL:  86 y.o.-year-old patient lying in the bed with no acute distress.  EYES: Pupils equal, round, reactive to light and accommodation. No scleral icterus.   HEENT: Head atraumatic, normocephalic. Oropharynx and nasopharynx clear.  NECK:  Supple, no jugular venous distention. No thyroid enlargement, no tenderness.  LUNGS: Normal breath sounds bilaterally, no wheezing, rales, rhonchi. No use of accessory muscles of respiration.  CARDIOVASCULAR: S1, S2 normal. No murmurs, rubs, or gallops.  ABDOMEN: Soft, nontender, nondistended. Bowel sounds present. No organomegaly or mass.  EXTREMITIES: No cyanosis, clubbing or edema b/l.    NEUROLOGIC: Cranial nerves II through XII are intact. No focal Motor or sensory deficits b/l. Generalized weakness and deconditioning PSYCHIATRIC:  patient is alert and oriented x  3.  SKIN: No obvious rash, lesion, or ulcer.   LABORATORY PANEL:  CBC Recent Labs  Lab 03/31/19 0603 03/31/19 1123 03/31/19 2324  WBC 17.1*  --   --   HGB 12.6*   < > 11.3*  HCT 38.8*   < > 33.9*  PLT 414*  --   --    < > = values in this interval not displayed.    Chemistries  Recent Labs  Lab 03/31/19 0020  03/31/19 0020 03/31/19 0603  NA 137   < > 137  K 3.3*   < > 4.0  CL 93*   < > 95*  CO2 31   < > 32  GLUCOSE 200*   < > 135*  BUN 23   < > 21  CREATININE 1.14   < > 1.04  CALCIUM 9.6   < > 9.0  MG  --   --  2.0  AST 25  --   --   ALT 19  --   --   ALKPHOS 86  --   --   BILITOT 1.3*  --   --    < > = values in this interval not displayed.   Cardiac Enzymes No results for input(s): TROPONINI in the last 168 hours. RADIOLOGY:  CT Angio Chest PE W and/or Wo Contrast  Result Date: 03/31/2019 CLINICAL DATA:  Intermittent shortness of breath, lower abdominal pain and fever with diarrhea for 4 days EXAM: CT ANGIOGRAPHY CHEST CT ABDOMEN AND PELVIS WITH CONTRAST TECHNIQUE: Multidetector CT imaging of the chest was performed using the standard protocol during bolus administration of intravenous contrast. Multiplanar CT image reconstructions and MIPs were obtained to evaluate the vascular anatomy. Multidetector CT imaging of the abdomen and pelvis was performed using the standard protocol during bolus administration of intravenous contrast. CONTRAST:  14m OMNIPAQUE IOHEXOL 350 MG/ML SOLN COMPARISON:  CTA chest 03/02/2019 MR abdomen 12/22/2018, CT abdomen pelvis 08/31/2016-2 FINDINGS: CTA CHEST FINDINGS Cardiovascular: Satisfactory opacification the pulmonary arteries to the segmental level. No pulmonary artery filling defects are identified. There is borderline enlargement of the central pulmonary arteries which is similar to comparison CT. Atherosclerotic calcification of the normal caliber thoracic aorta with some moderate tortuosity. Shared origin of brachiocephalic and left common carotid arteries. Proximal great vessels are unremarkable. Overall normal cardiac size with some right atrial ventricular dilatation. Coronary artery calcifications are present. No pericardial effusion. Mediastinum/Nodes: Few scattered low-attenuation partially calcified mediastinal and hilar nodes are similar to prior. No  suspicious adenopathy. No acute abnormality of the trachea or esophagus. Thyroid gland and thoracic inlet are unremarkable. Lungs/Pleura: There is bilateral subpleural reticulation in a basilar predominance with stacked cyst formation in the lung periphery. More dense opacity in the periphery of the right lung base may reflect some pulmonary microlithiasis which is unchanged from multiple priors. No superimposed areas of acute airspace disease are seen. There is a 1.2 cm subpleural nodule in the right upper lobe (4/43). This is unchanged from comparisons as remote is 2018 no new nodules are seen. No pneumothorax or effusion. Musculoskeletal: Multilevel degenerative changes are present in the imaged portions of the spine. No acute osseous abnormality or suspicious osseous lesion. Vertebral hemangioma at T10 is unchanged from priors. No worrisome chest wall lesions. Review of the MIP images confirms the above findings. CT ABDOMEN and PELVIS FINDINGS Hepatobiliary: No focal liver abnormality is seen. No gallstones, gallbladder wall thickening, or biliary dilatation. Interposition of the hepatic flexure anterior to the liver. This configuration  is similar to comparison is. Pancreas: Unremarkable. No pancreatic ductal dilatation or surrounding inflammatory changes. Septate cystic structure at the head of the pancreas measures approximately 3.3 by 2.1 cm in size, not significantly changed from comparison MRI. No new pancreatic lesions. No pancreatic ductal dilatation. Spleen: Normal in size without focal abnormality. Adrenals/Urinary Tract: Normal adrenal glands. Few subcentimeter hypoattenuating foci in the right kidney are too small to fully characterize. No suspicious renal lesions are seen. No visible urolithiasis or hydronephrosis. Kidneys enhance and excrete symmetrically. There is mild asymmetric bladder wall thickening more pronounced anteriorly, similar to priors. Stomach/Bowel: Distal esophagus, stomach and  duodenal sweep are unremarkable. No small bowel wall thickening or dilatation. No evidence of obstruction. Cecum is displaced superiorly with the hepatic flexure interposed anterior to the liver. A normal appendix is visualized. There is predominantly distal colonic edematous mural thickening with mucosal hyperemia which is contiguous from the level of the rectum to the transverse colon. Vascular/Lymphatic: Atherosclerotic plaque within the normal caliber aorta. No proximal vascular occlusions are seen. No pathologic abdominopelvic adenopathy. Reproductive: The prostate and seminal vesicles are unremarkable aside from mild prostatomegaly. Other: No abdominopelvic free fluid or free gas. No bowel containing hernias. Mild body wall edema. Musculoskeletal: Multilevel degenerative changes are present in the imaged portions of the spine. Mild grade 1 anterolisthesis of L4 on L5. Additional degenerative changes in the hips and SI joints. No acute osseous abnormality or suspicious osseous lesion. Review of the MIP images confirms the above findings. IMPRESSION: 1. No evidence for acute pulmonary artery embolism. 2. Stable 1.2 cm subpleural nodule in the right upper lobe. Given the stability over time, this is most likely benign. 3. Chronic interstitial lung disease. Findings are categorized as probable UIP per consensus guidelines: Diagnosis of Idiopathic Pulmonary Fibrosis: An Official ATS/ERS/JRS/ALAT Clinical Practice Guideline. [place], Iss 5, [phone], Oct 30 2016. 4. Coronary artery calcifications. 5.  Aortic Atherosclerosis (ICD10-I70.0). 6. Circumferential edematous mural thickening and mucosal hyperemia involving the distal colon extending from the rectum to the transverse colon, consistent with an infectious, inflammatory or possibly ischemic colitis though no open proximal vascular occlusions are clearly seen. 7. Stable septate cystic structure at the head of the pancreas, likely  side-branch IPMN versus pseudocyst. 8. Asymmetric bladder wall thickening, more pronounced anteriorly, similar to priors. Correlate with urinalysis to exclude cystitis but such features could also be seen with chronic outlet obstruction given mild prostatomegaly. Electronically Signed   By: Lovena Le M.D.   On: 03/31/2019 02:53   CT ABDOMEN PELVIS W CONTRAST  Result Date: 03/31/2019 CLINICAL DATA:  Intermittent shortness of breath, lower abdominal pain and fever with diarrhea for 4 days EXAM: CT ANGIOGRAPHY CHEST CT ABDOMEN AND PELVIS WITH CONTRAST TECHNIQUE: Multidetector CT imaging of the chest was performed using the standard protocol during bolus administration of intravenous contrast. Multiplanar CT image reconstructions and MIPs were obtained to evaluate the vascular anatomy. Multidetector CT imaging of the abdomen and pelvis was performed using the standard protocol during bolus administration of intravenous contrast. CONTRAST:  131m OMNIPAQUE IOHEXOL 350 MG/ML SOLN COMPARISON:  CTA chest 03/02/2019 MR abdomen 12/22/2018, CT abdomen pelvis 08/31/2016-2 FINDINGS: CTA CHEST FINDINGS Cardiovascular: Satisfactory opacification the pulmonary arteries to the segmental level. No pulmonary artery filling defects are identified. There is borderline enlargement of the central pulmonary arteries which is similar to comparison CT. Atherosclerotic calcification of the normal caliber thoracic aorta with some moderate tortuosity. Shared origin of brachiocephalic and left common  carotid arteries. Proximal great vessels are unremarkable. Overall normal cardiac size with some right atrial ventricular dilatation. Coronary artery calcifications are present. No pericardial effusion. Mediastinum/Nodes: Few scattered low-attenuation partially calcified mediastinal and hilar nodes are similar to prior. No suspicious adenopathy. No acute abnormality of the trachea or esophagus. Thyroid gland and thoracic inlet are  unremarkable. Lungs/Pleura: There is bilateral subpleural reticulation in a basilar predominance with stacked cyst formation in the lung periphery. More dense opacity in the periphery of the right lung base may reflect some pulmonary microlithiasis which is unchanged from multiple priors. No superimposed areas of acute airspace disease are seen. There is a 1.2 cm subpleural nodule in the right upper lobe (4/43). This is unchanged from comparisons as remote is 2018 no new nodules are seen. No pneumothorax or effusion. Musculoskeletal: Multilevel degenerative changes are present in the imaged portions of the spine. No acute osseous abnormality or suspicious osseous lesion. Vertebral hemangioma at T10 is unchanged from priors. No worrisome chest wall lesions. Review of the MIP images confirms the above findings. CT ABDOMEN and PELVIS FINDINGS Hepatobiliary: No focal liver abnormality is seen. No gallstones, gallbladder wall thickening, or biliary dilatation. Interposition of the hepatic flexure anterior to the liver. This configuration is similar to comparison is. Pancreas: Unremarkable. No pancreatic ductal dilatation or surrounding inflammatory changes. Septate cystic structure at the head of the pancreas measures approximately 3.3 by 2.1 cm in size, not significantly changed from comparison MRI. No new pancreatic lesions. No pancreatic ductal dilatation. Spleen: Normal in size without focal abnormality. Adrenals/Urinary Tract: Normal adrenal glands. Few subcentimeter hypoattenuating foci in the right kidney are too small to fully characterize. No suspicious renal lesions are seen. No visible urolithiasis or hydronephrosis. Kidneys enhance and excrete symmetrically. There is mild asymmetric bladder wall thickening more pronounced anteriorly, similar to priors. Stomach/Bowel: Distal esophagus, stomach and duodenal sweep are unremarkable. No small bowel wall thickening or dilatation. No evidence of obstruction. Cecum  is displaced superiorly with the hepatic flexure interposed anterior to the liver. A normal appendix is visualized. There is predominantly distal colonic edematous mural thickening with mucosal hyperemia which is contiguous from the level of the rectum to the transverse colon. Vascular/Lymphatic: Atherosclerotic plaque within the normal caliber aorta. No proximal vascular occlusions are seen. No pathologic abdominopelvic adenopathy. Reproductive: The prostate and seminal vesicles are unremarkable aside from mild prostatomegaly. Other: No abdominopelvic free fluid or free gas. No bowel containing hernias. Mild body wall edema. Musculoskeletal: Multilevel degenerative changes are present in the imaged portions of the spine. Mild grade 1 anterolisthesis of L4 on L5. Additional degenerative changes in the hips and SI joints. No acute osseous abnormality or suspicious osseous lesion. Review of the MIP images confirms the above findings. IMPRESSION: 1. No evidence for acute pulmonary artery embolism. 2. Stable 1.2 cm subpleural nodule in the right upper lobe. Given the stability over time, this is most likely benign. 3. Chronic interstitial lung disease. Findings are categorized as probable UIP per consensus guidelines: Diagnosis of Idiopathic Pulmonary Fibrosis: An Official ATS/ERS/JRS/ALAT Clinical Practice Guideline. [place], Iss 5, [phone], Oct 30 2016. 4. Coronary artery calcifications. 5.  Aortic Atherosclerosis (ICD10-I70.0). 6. Circumferential edematous mural thickening and mucosal hyperemia involving the distal colon extending from the rectum to the transverse colon, consistent with an infectious, inflammatory or possibly ischemic colitis though no open proximal vascular occlusions are clearly seen. 7. Stable septate cystic structure at the head of the pancreas, likely side-branch IPMN versus  pseudocyst. 8. Asymmetric bladder wall thickening, more pronounced anteriorly, similar to  priors. Correlate with urinalysis to exclude cystitis but such features could also be seen with chronic outlet obstruction given mild prostatomegaly. Electronically Signed   By: Lovena Le M.D.   On: 03/31/2019 02:53   ASSESSMENT AND PLAN:  Jimmy Jimenez  is a 86 y.o. African-American male with a known history of hypertension, dyslipidemia and thrombocytosis, who presented to the emergency room with onset of generalized abdominal pain with associated diarrhea since yesterday which has been loose and watery with minimal amount of blood once without melena.  No known history of hemorrhoids.  1. Acute colitis, most likely infectious given significant leukocytosis.  -Sepsis documented on admission--resolved now - continued IV Rocephin and Flagyl--change to oral abxs in am  -prn Pain management  - Given the minimal amount of bleeding that could be related to ischemic colitis  -serial hemoglobin and hematocrits remain stable -no diarrhea or bloody stool per RN--unable to send stool studies  -Stool for c diff (recently in hosp for CVA)--no stool sample given so far - GI consultation by Dr. Allen Norris appreciated-- recommends to  finish antibiotic course    2.  Hypokalemia.  -K 4.0  - magnesium level 2.0  3.  Hypertension.  Continue  lisinopril  4.  Thrombocytosis. continue hydroxyurea.  5.  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  DVT prophylaxis.  SCDs.  7. H/o Pancreatic head cystic lesion --follows with [facility] oncology Dr Cristino Martes (last visit in dec 2020)  Multiple co-morbidities and recent few admissions in the hospital for different reasons--Palliative care consult for GOC  Procedures: Family communication :met with wife at bedside Consults :GI Discharge Disposition :Likely Home CODE STATUS: Partial (No to ventilator) DVT Prophylaxis : SCD Barriers to discharge:  ongoing treatment of Colitis  TOTAL TIME TAKING CARE OF THIS PATIENT: *25* minutes.  >50% time spent on counselling and coordination of care  Note: This dictation was prepared with Dragon dictation along with smaller phrase technology. Any transcriptional errors that result from this process are unintentional.  Fritzi Mandes M.D    [facility]   CC: Primary care physician; Crecencio Mc, MDPatient ID: Jimmy Jimenez, male   DOB: 03/13/1925, 86 y.o.   MRN: [id number]

## 2019-04-01 NOTE — Progress Notes (Signed)
Jimmy Lame, MD [facility]   [address], [place], [place] [address] Phone: [phone] Fax : [phone]   Subjective: The patient continues to improve which makes ischemic colitis most likely the cause of his rectal bleeding.  He has had a complete lunch tray that he is finishing denies any abdominal pain nausea vomiting nor is he aware of any continued GI bleeding.   Objective: Vital signs in last 24 hours: Vitals:   03/31/19 1138 03/31/19 2012 04/01/19 0530 04/01/19 1122  BP: (!) 155/50 (!) 120/51 (!) 125/50 (!) 140/54  Pulse: 75 84 63 65  Resp:  17 17 18   Temp:  98.9 F (37.2 C) 98.4 F (36.9 C) 98 F (36.7 C)  TempSrc:  Oral Oral Oral  SpO2:  96% 97% 98%  Weight:      Height:       Weight change:   Intake/Output Summary (Last 24 hours) at 04/01/2019 1252 Last data filed at 04/01/2019 0554 Gross per 24 hour  Intake 1503 ml  Output --  Net 1503 ml     Exam: Heart:: Regular rate and rhythm, S1S2 present or without murmur or extra heart sounds Lungs: normal and clear to auscultation and percussion Abdomen: soft, nontender, normal bowel sounds   Lab Results: @LABTEST2 @ Micro Results: Recent Results (from the past 240 hour(s))  Respiratory Panel by RT PCR (Flu A&B, Covid) - Nasopharyngeal Swab     Status: None   Collection Time: 03/31/19  1:11 AM   Specimen: Nasopharyngeal Swab  Result Value Ref Range Status   SARS Coronavirus 2 by RT PCR NEGATIVE NEGATIVE Final    Comment: (NOTE) SARS-CoV-2 target nucleic acids are NOT DETECTED. The SARS-CoV-2 RNA is generally detectable in upper respiratoy specimens during the acute phase of infection. The lowest concentration of SARS-CoV-2 viral copies this assay can detect is 131 copies/mL. A negative result does not preclude SARS-Cov-2 infection and should not be used as the sole basis for treatment or other patient management decisions. A negative result may occur with  improper specimen collection/handling,  submission of specimen other than nasopharyngeal swab, presence of viral mutation(s) within the areas targeted by this assay, and inadequate number of viral copies (<131 copies/mL). A negative result must be combined with clinical observations, patient history, and epidemiological information. The expected result is Negative. Fact Sheet for Patients:  PinkCheek.be Fact Sheet for Healthcare Providers:  GravelBags.it This test is not yet ap proved or cleared by the [place] FDA and  has been authorized for detection and/or diagnosis of SARS-CoV-2 by FDA under an Emergency Use Authorization (EUA). This EUA will remain  in effect (meaning this test can be used) for the duration of the COVID-19 declaration under Section 564(b)(1) of the Act, 21 U.S.C. section 360bbb-3(b)(1), unless the authorization is terminated or revoked sooner.    Influenza A by PCR NEGATIVE NEGATIVE Final   Influenza B by PCR NEGATIVE NEGATIVE Final    Comment: (NOTE) The Xpert Xpress SARS-CoV-2/FLU/RSV assay is intended as an aid in  the diagnosis of influenza from Nasopharyngeal swab specimens and  should not be used as a sole basis for treatment. Nasal washings and  aspirates are unacceptable for Xpert Xpress SARS-CoV-2/FLU/RSV  testing. Fact Sheet for Patients: PinkCheek.be Fact Sheet for Healthcare Providers: GravelBags.it This test is not yet approved or cleared by the [place] FDA and  has been authorized for detection and/or diagnosis of SARS-CoV-2 by  FDA under an Emergency Use Authorization (EUA). This EUA will remain  in effect (meaning this test can be used) for the duration of the  Covid-19 declaration under Section 564(b)(1) of the Act, 21  U.S.C. section 360bbb-3(b)(1), unless the authorization is  terminated or revoked. Performed at [facility], [address] [place], [place] [address]    Studies/Results: CT Angio Chest PE W and/or Wo Contrast  Result Date: 03/31/2019 CLINICAL DATA:  Intermittent shortness of breath, lower abdominal pain and fever with diarrhea for 4 days EXAM: CT ANGIOGRAPHY CHEST CT ABDOMEN AND PELVIS WITH CONTRAST TECHNIQUE: Multidetector CT imaging of the chest was performed using the standard protocol during bolus administration of intravenous contrast. Multiplanar CT image reconstructions and MIPs were obtained to evaluate the vascular anatomy. Multidetector CT imaging of the abdomen and pelvis was performed using the standard protocol during bolus administration of intravenous contrast. CONTRAST:  171mL OMNIPAQUE IOHEXOL 350 MG/ML SOLN COMPARISON:  CTA chest 03/02/2019 MR abdomen 12/22/2018, CT abdomen pelvis 08/31/2016-2 FINDINGS: CTA CHEST FINDINGS Cardiovascular: Satisfactory opacification the pulmonary arteries to the segmental level. No pulmonary artery filling defects are identified. There is borderline enlargement of the central pulmonary arteries which is similar to comparison CT. Atherosclerotic calcification of the normal caliber thoracic aorta with some moderate tortuosity. Shared origin of brachiocephalic and left common carotid arteries. Proximal great vessels are unremarkable. Overall normal cardiac size with some right atrial ventricular dilatation. Coronary artery calcifications are present. No pericardial effusion. Mediastinum/Nodes: Few scattered low-attenuation partially calcified mediastinal and hilar nodes are similar to prior. No suspicious adenopathy. No acute abnormality of the trachea or esophagus. Thyroid gland and thoracic inlet are unremarkable. Lungs/Pleura: There is bilateral subpleural reticulation in a basilar predominance with stacked cyst formation in the lung periphery. More dense opacity in the periphery of the right lung base may reflect some pulmonary microlithiasis which is unchanged from multiple  priors. No superimposed areas of acute airspace disease are seen. There is a 1.2 cm subpleural nodule in the right upper lobe (4/43). This is unchanged from comparisons as remote is 2018 no new nodules are seen. No pneumothorax or effusion. Musculoskeletal: Multilevel degenerative changes are present in the imaged portions of the spine. No acute osseous abnormality or suspicious osseous lesion. Vertebral hemangioma at T10 is unchanged from priors. No worrisome chest wall lesions. Review of the MIP images confirms the above findings. CT ABDOMEN and PELVIS FINDINGS Hepatobiliary: No focal liver abnormality is seen. No gallstones, gallbladder wall thickening, or biliary dilatation. Interposition of the hepatic flexure anterior to the liver. This configuration is similar to comparison is. Pancreas: Unremarkable. No pancreatic ductal dilatation or surrounding inflammatory changes. Septate cystic structure at the head of the pancreas measures approximately 3.3 by 2.1 cm in size, not significantly changed from comparison MRI. No new pancreatic lesions. No pancreatic ductal dilatation. Spleen: Normal in size without focal abnormality. Adrenals/Urinary Tract: Normal adrenal glands. Few subcentimeter hypoattenuating foci in the right kidney are too small to fully characterize. No suspicious renal lesions are seen. No visible urolithiasis or hydronephrosis. Kidneys enhance and excrete symmetrically. There is mild asymmetric bladder wall thickening more pronounced anteriorly, similar to priors. Stomach/Bowel: Distal esophagus, stomach and duodenal sweep are unremarkable. No small bowel wall thickening or dilatation. No evidence of obstruction. Cecum is displaced superiorly with the hepatic flexure interposed anterior to the liver. A normal appendix is visualized. There is predominantly distal colonic edematous mural thickening with mucosal hyperemia which is contiguous from the level of the rectum to the transverse colon.  Vascular/Lymphatic: Atherosclerotic plaque within the normal  caliber aorta. No proximal vascular occlusions are seen. No pathologic abdominopelvic adenopathy. Reproductive: The prostate and seminal vesicles are unremarkable aside from mild prostatomegaly. Other: No abdominopelvic free fluid or free gas. No bowel containing hernias. Mild body wall edema. Musculoskeletal: Multilevel degenerative changes are present in the imaged portions of the spine. Mild grade 1 anterolisthesis of L4 on L5. Additional degenerative changes in the hips and SI joints. No acute osseous abnormality or suspicious osseous lesion. Review of the MIP images confirms the above findings. IMPRESSION: 1. No evidence for acute pulmonary artery embolism. 2. Stable 1.2 cm subpleural nodule in the right upper lobe. Given the stability over time, this is most likely benign. 3. Chronic interstitial lung disease. Findings are categorized as probable UIP per consensus guidelines: Diagnosis of Idiopathic Pulmonary Fibrosis: An Official ATS/ERS/JRS/ALAT Clinical Practice Guideline. [place], Iss 5, [phone], Oct 30 2016. 4. Coronary artery calcifications. 5.  Aortic Atherosclerosis (ICD10-I70.0). 6. Circumferential edematous mural thickening and mucosal hyperemia involving the distal colon extending from the rectum to the transverse colon, consistent with an infectious, inflammatory or possibly ischemic colitis though no open proximal vascular occlusions are clearly seen. 7. Stable septate cystic structure at the head of the pancreas, likely side-branch IPMN versus pseudocyst. 8. Asymmetric bladder wall thickening, more pronounced anteriorly, similar to priors. Correlate with urinalysis to exclude cystitis but such features could also be seen with chronic outlet obstruction given mild prostatomegaly. Electronically Signed   By: Lovena Le M.D.   On: 03/31/2019 02:53   CT ABDOMEN PELVIS W CONTRAST  Result Date:  03/31/2019 CLINICAL DATA:  Intermittent shortness of breath, lower abdominal pain and fever with diarrhea for 4 days EXAM: CT ANGIOGRAPHY CHEST CT ABDOMEN AND PELVIS WITH CONTRAST TECHNIQUE: Multidetector CT imaging of the chest was performed using the standard protocol during bolus administration of intravenous contrast. Multiplanar CT image reconstructions and MIPs were obtained to evaluate the vascular anatomy. Multidetector CT imaging of the abdomen and pelvis was performed using the standard protocol during bolus administration of intravenous contrast. CONTRAST:  188mL OMNIPAQUE IOHEXOL 350 MG/ML SOLN COMPARISON:  CTA chest 03/02/2019 MR abdomen 12/22/2018, CT abdomen pelvis 08/31/2016-2 FINDINGS: CTA CHEST FINDINGS Cardiovascular: Satisfactory opacification the pulmonary arteries to the segmental level. No pulmonary artery filling defects are identified. There is borderline enlargement of the central pulmonary arteries which is similar to comparison CT. Atherosclerotic calcification of the normal caliber thoracic aorta with some moderate tortuosity. Shared origin of brachiocephalic and left common carotid arteries. Proximal great vessels are unremarkable. Overall normal cardiac size with some right atrial ventricular dilatation. Coronary artery calcifications are present. No pericardial effusion. Mediastinum/Nodes: Few scattered low-attenuation partially calcified mediastinal and hilar nodes are similar to prior. No suspicious adenopathy. No acute abnormality of the trachea or esophagus. Thyroid gland and thoracic inlet are unremarkable. Lungs/Pleura: There is bilateral subpleural reticulation in a basilar predominance with stacked cyst formation in the lung periphery. More dense opacity in the periphery of the right lung base may reflect some pulmonary microlithiasis which is unchanged from multiple priors. No superimposed areas of acute airspace disease are seen. There is a 1.2 cm subpleural nodule in the  right upper lobe (4/43). This is unchanged from comparisons as remote is 2018 no new nodules are seen. No pneumothorax or effusion. Musculoskeletal: Multilevel degenerative changes are present in the imaged portions of the spine. No acute osseous abnormality or suspicious osseous lesion. Vertebral hemangioma at T10 is unchanged from priors. No worrisome chest  wall lesions. Review of the MIP images confirms the above findings. CT ABDOMEN and PELVIS FINDINGS Hepatobiliary: No focal liver abnormality is seen. No gallstones, gallbladder wall thickening, or biliary dilatation. Interposition of the hepatic flexure anterior to the liver. This configuration is similar to comparison is. Pancreas: Unremarkable. No pancreatic ductal dilatation or surrounding inflammatory changes. Septate cystic structure at the head of the pancreas measures approximately 3.3 by 2.1 cm in size, not significantly changed from comparison MRI. No new pancreatic lesions. No pancreatic ductal dilatation. Spleen: Normal in size without focal abnormality. Adrenals/Urinary Tract: Normal adrenal glands. Few subcentimeter hypoattenuating foci in the right kidney are too small to fully characterize. No suspicious renal lesions are seen. No visible urolithiasis or hydronephrosis. Kidneys enhance and excrete symmetrically. There is mild asymmetric bladder wall thickening more pronounced anteriorly, similar to priors. Stomach/Bowel: Distal esophagus, stomach and duodenal sweep are unremarkable. No small bowel wall thickening or dilatation. No evidence of obstruction. Cecum is displaced superiorly with the hepatic flexure interposed anterior to the liver. A normal appendix is visualized. There is predominantly distal colonic edematous mural thickening with mucosal hyperemia which is contiguous from the level of the rectum to the transverse colon. Vascular/Lymphatic: Atherosclerotic plaque within the normal caliber aorta. No proximal vascular occlusions are  seen. No pathologic abdominopelvic adenopathy. Reproductive: The prostate and seminal vesicles are unremarkable aside from mild prostatomegaly. Other: No abdominopelvic free fluid or free gas. No bowel containing hernias. Mild body wall edema. Musculoskeletal: Multilevel degenerative changes are present in the imaged portions of the spine. Mild grade 1 anterolisthesis of L4 on L5. Additional degenerative changes in the hips and SI joints. No acute osseous abnormality or suspicious osseous lesion. Review of the MIP images confirms the above findings. IMPRESSION: 1. No evidence for acute pulmonary artery embolism. 2. Stable 1.2 cm subpleural nodule in the right upper lobe. Given the stability over time, this is most likely benign. 3. Chronic interstitial lung disease. Findings are categorized as probable UIP per consensus guidelines: Diagnosis of Idiopathic Pulmonary Fibrosis: An Official ATS/ERS/JRS/ALAT Clinical Practice Guideline. [place], Iss 5, [phone], Oct 30 2016. 4. Coronary artery calcifications. 5.  Aortic Atherosclerosis (ICD10-I70.0). 6. Circumferential edematous mural thickening and mucosal hyperemia involving the distal colon extending from the rectum to the transverse colon, consistent with an infectious, inflammatory or possibly ischemic colitis though no open proximal vascular occlusions are clearly seen. 7. Stable septate cystic structure at the head of the pancreas, likely side-branch IPMN versus pseudocyst. 8. Asymmetric bladder wall thickening, more pronounced anteriorly, similar to priors. Correlate with urinalysis to exclude cystitis but such features could also be seen with chronic outlet obstruction given mild prostatomegaly. Electronically Signed   By: Lovena Le M.D.   On: 03/31/2019 02:53   Medications: I have reviewed the patient's current medications. Scheduled Meds:  atorvastatin  10 mg Oral q1800   azelastine  2 spray Each Nare BID   cholecalciferol   500 Units Oral Daily   clopidogrel  75 mg Oral Daily   feeding supplement (ENSURE ENLIVE)  237 mL Oral BID BM   fluticasone  1 spray Each Nare Daily   hydroxyurea  500 mg Oral QODAY   lisinopril  40 mg Oral QHS   sodium chloride flush  3 mL Intravenous Once   cyanocobalamin  5,000 mcg Oral Daily   Continuous Infusions:  cefTRIAXone (ROCEPHIN)  IV Stopped (04/01/19 0543)   metronidazole 100 mL/hr at 04/01/19 0554   PRN Meds:.acetaminophen **  OR** acetaminophen, docusate sodium, liver oil-zinc oxide, magnesium hydroxide, ondansetron **OR** ondansetron (ZOFRAN) IV, traZODone   Assessment: Active Problems:   Acute colitis   Cerebrovascular accident (CVA) ([place])   Hematochezia   IPMN (intraductal papillary mucinous neoplasm)    Plan: This patient is progressing well and tolerating a diet.  There is no further bleeding reported.  His hemoglobin is slightly lower than yesterday but still above 11.  The patient likely had ischemic colitis.  Nothing further to do from GI point of view.  I will sign off.  Please call if any further GI concerns or questions.  We would like to thank you for the opportunity to participate in the care of Jimmy Jimenez.     LOS: 1 day   Jimmy Jimenez 04/01/2019, 12:52 PM Pager [phone] 7am-5pm  Check AMION for 5pm -7am coverage and on weekends

## 2019-04-02 ENCOUNTER — Telehealth: Payer: Self-pay | Admitting: Internal Medicine

## 2019-04-02 DIAGNOSIS — I63419 Cerebral infarction due to embolism of unspecified middle cerebral artery: Secondary | ICD-10-CM

## 2019-04-02 DIAGNOSIS — Z515 Encounter for palliative care: Secondary | ICD-10-CM

## 2019-04-02 DIAGNOSIS — D490 Neoplasm of unspecified behavior of digestive system: Secondary | ICD-10-CM

## 2019-04-02 DIAGNOSIS — Z7189 Other specified counseling: Secondary | ICD-10-CM

## 2019-04-02 MED ORDER — METRONIDAZOLE 500 MG PO TABS: 500.0000 mg | ORAL_TABLET | Freq: Three times a day (TID) | ORAL | 0 refills | Status: AC

## 2019-04-02 MED ORDER — CEFDINIR 300 MG PO CAPS: 300.0000 mg | ORAL_CAPSULE | Freq: Two times a day (BID) | ORAL | 0 refills | Status: AC

## 2019-04-02 MED ORDER — METRONIDAZOLE 500 MG PO TABS
500.0000 mg | ORAL_TABLET | Freq: Three times a day (TID) | ORAL | Status: DC
  Administered 2019-04-02: 14:00:00 500 mg via ORAL
  Filled 2019-04-02 (×2): qty 1

## 2019-04-02 MED ORDER — CEFDINIR 300 MG PO CAPS
300.0000 mg | ORAL_CAPSULE | Freq: Two times a day (BID) | ORAL | Status: DC
  Filled 2019-04-02: qty 1

## 2019-04-02 NOTE — TOC Initial Note (Signed)
Transition of Care [facility]) - Initial/Assessment Note    Patient Details  Name: Jimmy Jimenez MRN: [id number] Date of Birth: 03/13/1925  Transition of [facility]) CM/SW Contact:    Beverly Sessions, RN Phone Number: 04/02/2019, 2:48 PM  Clinical Narrative:                 Patient to discharge home today  Patient lives at home with wife. Wife to transport at discharge today  PCP [place]  Denies issues obtaining medications or with transportation  Patient open with home health PT through [facility]  Patient to discharge with RN and PT.  [place] at [facility] notified  Outpatient palliative referral made to Santiago Glad with Industrial/product designer with [employer] to deliver [facility] prior to discharge     Expected Discharge Plan: [place] Barriers to Discharge: No Barriers Identified   Patient Goals and CMS Choice        Expected Discharge Plan and Services Expected Discharge Plan: [place]         Expected Discharge Date: 04/02/19               DME Arranged: 3-N-1 DME Agency: [employer] Date DME Agency Contacted: 04/02/19   Representative spoke with at DME Agency: [place]: PT, RN [place] Agency: [facility] [place] Date [facility] Agency Contacted: 04/02/19   Representative spoke with at [place]: [place]  Prior Living Arrangements/Services     Patient language and need for interpreter reviewed:: Yes            Current home services: DME, Home PT    Activities of Daily Living Home Assistive Devices/Equipment: Environmental consultant (specify type) ADL Screening (condition at time of admission) Patient's cognitive ability adequate to safely complete daily activities?: Yes Is the patient deaf or have difficulty hearing?: No Does the patient have difficulty seeing, even when wearing glasses/contacts?: No Does the patient have difficulty concentrating, remembering, or making decisions?: Yes Patient able to express need for assistance with ADLs?:  Yes Does the patient have difficulty dressing or bathing?: No Independently performs ADLs?: Yes (appropriate for developmental age) Does the patient have difficulty walking or climbing stairs?: No Weakness of Legs: None Weakness of Arms/Hands: None  Permission Sought/Granted                  Emotional Assessment       Orientation: : Oriented to Self, Oriented to Place, Oriented to  Time, Oriented to Situation   Psych Involvement: No (comment)  Admission diagnosis:  Colitis [K52.9] Acute colitis [K52.9] Patient Active Problem List   Diagnosis Date Noted  . IPMN (intraductal papillary mucinous neoplasm) 04/01/2019  . Acute colitis 03/31/2019  . Cerebrovascular accident (CVA) ([place])   . Hematochezia   . Hospital discharge follow-up 03/11/2019  . Pseudoaneurysm ([place]) R groin 03/08/2019  . Leukocytosis 03/08/2019  . Acute blood loss anemia 03/08/2019  . Mild malnutrition ([place]) 03/08/2019  . Pressure injury of skin 03/06/2019  . 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 GERD (gastroesophageal reflux disease) 03/02/2019  . Chronic diastolic CHF (congestive heart failure) ([place]) 03/02/2019  . Elevated troponin 03/02/2019  . Sacral decubitus ulcer, stage II ([place]) 02/19/2019  . Chest pain 09/30/2018  . Rhinitis, chronic 11/20/2017  . Mass of pancreas 04/23/2016  . B12 deficiency 08/17/2015  . Low serum testosterone 05/20/2015  . Chronic constipation 03/31/2015  . Pulmonary fibrosis ([place]) 12/12/2014  .  Abnormal weight loss 08/20/2014  . Medicare annual wellness visit, subsequent 09/28/2013  . Statin intolerance 09/26/2013  . Pulmonary hypertension, mild ([place]) 12/14/2012  . Solitary pulmonary nodule 10/16/2012  . Heart failure, diastolic, due to HTN ([place]) 10/16/2012  . Bradycardia, sinus 09/02/2012  . Hyponatremia 12/01/2011  . Edema of both legs 10/30/2011  . Essential thrombocytosis ([place])   .  Hyperlipidemia   . Essential hypertension   . Osteoporosis    PCP:  Crecencio Mc, MD Pharmacy:   [facility], [place] [address] Phone: [phone] Fax: [phone]     Social Determinants of Health (SDOH) Interventions    Readmission Risk Interventions Readmission Risk Prevention Plan 03/08/2019  Transportation Screening Complete  PCP or Specialist Appt within 5-7 Days Complete  Home Care Screening Complete  Medication Review (RN CM) Complete  Some recent data might be hidden

## 2019-04-02 NOTE — Telephone Encounter (Signed)
Pt's wife called and said she picked up prescription from pharmacy and they only gave her 14 pills for lisinopril and they didn't give her any esomeprazole. She was wondering if Dr. Derrel Nip changed his medication? I told her it looks like a refill for lisinopril was sent to [facility] on 03/22/19 for 90 pills. I told her she needs to contact the pharmacy and I will ask if anything was changed but it doesn't look like it was. Please call pt's wife she is bringing patient home from the hospital today and wants to make sure he has his medication.

## 2019-04-02 NOTE — Telephone Encounter (Signed)
LMTCB

## 2019-04-02 NOTE — Plan of Care (Signed)
Iv removed. Discharged information provided. Teach back method utilized.  Problem: Education: Goal: Knowledge of General Education information will improve Description: Including pain rating scale, medication(s)/side effects and non-pharmacologic comfort measures Outcome: Completed/Met   Problem: Health Behavior/Discharge Planning: Goal: Ability to manage health-related needs will improve Outcome: Completed/Met   Problem: Clinical Measurements: Goal: Ability to maintain clinical measurements within normal limits will improve Outcome: Completed/Met Goal: Will remain free from infection Outcome: Completed/Met Goal: Diagnostic test results will improve Outcome: Completed/Met Goal: Respiratory complications will improve Outcome: Completed/Met Goal: Cardiovascular complication will be avoided Outcome: Completed/Met   Problem: Coping: Goal: Level of anxiety will decrease Outcome: Completed/Met   Problem: Elimination: Goal: Will not experience complications related to bowel motility Outcome: Completed/Met Goal: Will not experience complications related to urinary retention Outcome: Completed/Met

## 2019-04-02 NOTE — Progress Notes (Signed)
New referral for [facility] community Palliative to follow at home received from [facility]. Patient to discharge home today. [facility]. Patient information given to referral. Thank you. Flo Shanks BSN, RN, [place] [phone]

## 2019-04-02 NOTE — Telephone Encounter (Signed)
THE PHARMACY MAY HAVE FILLED AN RX FORM THE HOSPITALIST , NOT FROM ME

## 2019-04-02 NOTE — Telephone Encounter (Signed)
Do not see where any medications have been changed and on 03/22/2019 the lisinopril was sent in as a 90 day supply. Pt's wife was advised that she would need to call the pharmacy to see why they only filled the rx for 14 days.

## 2019-04-02 NOTE — Consult Note (Signed)
Consultation Note Date: 04/02/2019   Patient Name: Jimmy Jimenez  DOB: 03/13/1925  MRN: [id number]  Age / Sex: 86 y.o., male   PCP: Crecencio Mc, MD Referring Physician: Fritzi Mandes, MD   REASON FOR CONSULTATION:Establishing goals of care  Palliative Care consult requested for goals of care discussion in this 86 y.o. male with multiple medical problems including hypertension, dyslipidemia, osteoporosis, cystic lesion head of the pancreas (Dr. Mariah Milling @ [facility]), dCHF, left sided CVA s/p TPA and mechanical thrombectomy (03/05/19), and thrombocytosis. He presented to ED with complaints of abdominal pain and diarrhea. During his ED work-up COVID-19 negative. CT of abdomen showed inflammatory or possibly ischemic colitis extending from the distal colon to the rectum and to the transverse colon.  There was stable separate cystic structure in the head of the pancreas and possible cystitis. He was started on IV antibiotics. Since admission patient has been evaluated and followed by GI with recommendations to continue antibiotic and hydration.   Clinical Assessment and Goals of Care: I have reviewed medical records including lab results, imaging, Epic notes, and MAR, received report from the bedside RN, and assessed the patient. I met at the bedside with patient and his wife Jimmy Jimenez  to discuss diagnosis prognosis, GOC, EOL wishes, disposition and options.  Patient awake, alert & oriented x3. He is wiping his nose reporting he has constant problems with nasal drip. He denies pain or discomfort.   I introduced Palliative Medicine as specialized medical care for people living with serious illness. It focuses on providing relief from the symptoms and stress of a serious illness. The goal is to improve quality of life for both the patient and the family. Both patient and wife verbalized understanding.   We discussed a brief life review of the patient, along with his functional and nutritional status.  Wife reports they have been married for more than 51 years. They have a son and several grandchildren. Patient is a retired Lawyer.   Prior to admission wife reports patient was ambulatory in the home with use of a walker at times. She assisted with ADLs. Patient reports he had a "decent" appetite prior to admission however, he would only eat foods he liked. He reports one of his favorite things to eat no matter what time of day is oatmeal. Wife reports PT was working with patient in the home.   We discussed His current illness and what it means in the larger context of His on-going co-morbidities. With specific discussions regarding his frequent hospitalizations, recent CVA, pancreatic neoplasm, and overall functional and nutritional decline. Natural disease trajectory and expectations at EOL were discussed.  Extensive discussions with wife and patient. [name] defers most of the conversation and responses to his wife. Wife verbalizes understanding of patient's current illness and condition. She continues to feel that he is eating ok and she is ok with him only eating what he wants. She shares her granddaughter is a trained NP from [facility] and is heavily involved in their care. She reports she lives out of state but is usually updated on everything and will make phone calls and arrangements when they are in need.   I attempted to elicit values and goals of care important to the patient.    The difference between aggressive medical intervention and comfort care was considered in light of the patient's goals of care. Wife states their goal is for patient to discharge home and continue with support from  his outpatient team. She reports their plans to continue with follow up with their Oncologist at [facility].   Wife reports she is remaining hopeful patient will continue to "be around" for a little longer and she intends to see that it happens with the help of his PCP. She shares her  trust in Dr. Derrel Nip and her medical care. Support given.   Advanced directives, concepts specific to code status, artifical feeding and hydration, and rehospitalization were considered and discussed. Patient does not have a documented advanced directive. Mr. Wieczorek confirms his wife would be his decision maker with support from other family members. I attempted to introduce the MOST form in the setting of recurrent admission and lack of advanced directives, however wife expressed their wishes to follow up with PCP for any similar discussions. Support given.   Discussed in details patient's full code status with consideration to his current illness and co-morbidities. Wife confirms wishes for all aggressive measures except intubation. Patient reports he would not want to be on life-support machinery.   Hospice and Palliative Care services outpatient were explained and offered. Patient and family verbalized their understanding and awareness of both palliative and hospice's goals and philosophy of care. Recommendations provided for outpatient palliative support give patient and wife's expressed goals to continue with full aggressive medical interventions. They verbalized agreement of palliative support however, wife reports she would further discuss with PCP also. She expressed concerns with different health care members entering their home during the state of COVID. Support given and she is aware some palliative follow-ups may possibly be appropriate by phone depending on circumstances.   Questions and concerns were addressed.  The family was encouraged to call with questions or concerns.  PMT will continue to support holistically.   SOCIAL HISTORY:     reports that he has never smoked. He has never used smokeless tobacco. He reports that he does not drink alcohol or use drugs.  CODE STATUS: Limited code, NO INTUBATION   ADVANCE DIRECTIVES: Wife   SYMPTOM MANAGEMENT: per attending   Palliative  Prophylaxis:   Aspiration, Bowel Regimen, Delirium Protocol and Frequent Pain Assessment   PSYCHO-SOCIAL/SPIRITUAL:  Support System: Family  Desire for further Chaplaincy support:NO   Additional Recommendations (Limitations, Scope, Preferences):  Full Scope Treatment   PAST MEDICAL HISTORY: Past Medical History:  Diagnosis Date  . Colon polyps   . Essential thrombocytosis ([place])   . Essential thrombocytosis ([place])   . Hyperlipidemia   . Hypertension   . Lung nodule 12/2004   found on CXR  . Osteoporosis     PAST SURGICAL HISTORY:  Past Surgical History:  Procedure Laterality Date  . BONE MARROW BIOPSY  01/18/07  . COLONOSCOPY  2007, 2010   Dr Allen Norris  . IR CT HEAD LTD  03/04/2019  . IR PERCUTANEOUS ART THROMBECTOMY/INFUSION INTRACRANIAL INC DIAG ANGIO  03/04/2019  . IR RADIOLOGIST EVAL & MGMT  03/20/2019  . IR US GUIDE BX ASP/DRAIN  03/07/2019  . IR US GUIDE VASC ACCESS RIGHT  03/04/2019  . LOOP RECORDER INSERTION N/A 03/07/2019   Procedure: LOOP RECORDER INSERTION;  Surgeon: Constance Haw, MD;  Location: [place] [facility];  Service: Cardiovascular;  Laterality: N/A;  . NASAL SINUS SURGERY  06/11/05  . RADIOLOGY WITH ANESTHESIA N/A 03/03/2019   Procedure: IR WITH ANESTHESIA;  Surgeon: Luanne Bras, MD;  Location: [place];  Service: Radiology;  Laterality: N/A;    ALLERGIES:  is allergic to amlodipine; atenolol; erythromycin; furosemide; hctz [hydrochlorothiazide]; hydrochlorothiazide  w-triamterene; levofloxacin; metoprolol; micardis [telmisartan]; penicillins; terazosin; and triamterene-hctz.   MEDICATIONS:  Current Facility-Administered Medications  Medication Dose Route Frequency Provider Last Rate Last Admin  . acetaminophen (TYLENOL) tablet 650 mg  650 mg Oral Q6H PRN Mansy, Jan A, MD   650 mg at 04/02/19 0455   Or  . acetaminophen (TYLENOL) suppository 650 mg  650 mg Rectal Q6H PRN Mansy, Jan A, MD      . atorvastatin (LIPITOR) tablet 10 mg  10 mg Oral [id number]  Fritzi Mandes, MD   10 mg at 04/01/19 1752  . azelastine (ASTELIN) 0.1 % nasal spray 2 spray  2 spray Each Nare BID Mansy, Jan A, MD   2 spray at 04/02/19 1021  . cefdinir (OMNICEF) capsule 300 mg  300 mg Oral Q12H Fritzi Mandes, MD      . cholecalciferol (VITAMIN D3) tablet 500 Units  500 Units Oral Daily Mansy, Jan A, MD   500 Units at 04/02/19 1014  . clopidogrel (PLAVIX) tablet 75 mg  75 mg Oral Daily Fritzi Mandes, MD   75 mg at 04/02/19 1015  . docusate sodium (COLACE) capsule 100 mg  100 mg Oral BID PRN Mansy, Jan A, MD      . feeding supplement (ENSURE ENLIVE) (ENSURE ENLIVE) liquid 237 mL  237 mL Oral BID BM Mansy, Jan A, MD   237 mL at 04/02/19 1342  . fluticasone (FLONASE) 50 MCG/ACT nasal spray 1 spray  1 spray Each Nare Daily Mansy, Jan A, MD   1 spray at 04/02/19 1021  . hydroxyurea (HYDREA) capsule 500 mg  500 mg Oral QODAY Mansy, Jan A, MD   500 mg at 04/02/19 1016  . lisinopril (ZESTRIL) tablet 40 mg  40 mg Oral QHS Mansy, Jan A, MD   40 mg at 04/01/19 2046  . liver oil-zinc oxide (DESITIN) 40 % ointment 1 application  1 application Topical PRN Mansy, Jan A, MD      . magnesium hydroxide (MILK OF MAGNESIA) suspension 30 mL  30 mL Oral Daily PRN Mansy, Jan A, MD      . metroNIDAZOLE (FLAGYL) tablet 500 mg  500 mg Oral Q8H Fritzi Mandes, MD   500 mg at 04/02/19 1350  . ondansetron (ZOFRAN) tablet 4 mg  4 mg Oral Q6H PRN Mansy, Jan A, MD       Or  . ondansetron [facility]) injection 4 mg  4 mg Intravenous Q6H PRN Mansy, Jan A, MD      . sodium chloride flush (NS) 0.9 % injection 3 mL  3 mL Intravenous Once Vanessa [place], MD      . traZODone (DESYREL) tablet 25 mg  25 mg Oral QHS PRN Mansy, Jan A, MD   25 mg at 04/01/19 2047  . vitamin B-12 (CYANOCOBALAMIN) tablet 5,000 mcg  5,000 mcg Oral Daily Mansy, Jan A, MD   5,000 mcg at 04/02/19 1018    VITAL SIGNS: BP (!) 157/70 (BP Location: Right Arm)   Pulse 78   Temp 98 F (36.7 C) (Oral)   Resp 20   Ht 5' 8"  (1.727 m)   Wt 59.9 kg   SpO2  97%   BMI 20.07 kg/m  Filed Weights   03/31/19 0008  Weight: 59.9 kg    Estimated body mass index is 20.07 kg/m as calculated from the following:   Height as of this encounter: 5' 8"  (1.727 m).   Weight as of this encounter: 59.9 kg.  LABS: CBC:  Component Value Date/Time   WBC 17.1 (H) 03/31/2019 0603   HGB 11.3 (L) 03/31/2019 2324   HGB 13.7 12/24/2013 0921   HCT 33.9 (L) 03/31/2019 2324   HCT 41.1 12/24/2013 0921   PLT 414 (H) 03/31/2019 0603   PLT 286 12/24/2013 0921   Comprehensive Metabolic Panel:    Component Value Date/Time   NA 137 03/31/2019 0603   NA 136 05/19/2011 1049   K 4.0 03/31/2019 0603   K 4.3 05/19/2011 1049   CO2 32 03/31/2019 0603   CO2 32 05/19/2011 1049   BUN 21 03/31/2019 0603   BUN 8 05/19/2011 1049   CREATININE 1.04 03/31/2019 0603   CREATININE 1.15 12/24/2013 0921   ALBUMIN 4.1 03/31/2019 0020   ALBUMIN 4.0 12/24/2013 0921     Review of Systems  Constitutional: Positive for fatigue.  Neurological: Positive for weakness.  Unless otherwise noted, a complete review of systems is negative.  Physical Exam General: NAD, frail chronically-ill appearing Cardiovascular: regular rate and rhythm Pulmonary: clear ant fields Abdomen: soft, nontender, + bowel sounds Extremities: no edema, no joint deformities Skin: no rashes Neurological: A&O x3, mood appropriate   Prognosis: Unable to determine   Discharge Planning:  Home with Home Health and palliative   Recommendations:  Partial Code-No INTUBATION  Continue with current plan of care per medical team  Wife remains hopeful patient will continue to thrive. Reports she plans to follow up with outpatient medical team PCP and Oncology at [facility].   Open to outpatient palliative support but also somewhat guarded with multiple visitors in the home due to [place]. Also would like to discuss MOST form and other advanced directives with Dr. Derrel Nip.    Palliative Performance Scale: PPS 30%                 Patient and wife expressed understanding and was in agreement with this plan.   Thank you for allowing the Palliative Medicine Team to assist in the care of this patient.  Time In: 1305 Time Out: 1400 Time Total: 55 min.   Visit consisted of counseling and education dealing with the complex and emotionally intense issues of symptom management and palliative care in the setting of serious and potentially life-threatening illness.Greater than 50%  of this time was spent counseling and coordinating care related to the above assessment and plan.  Signed by:  Alda Lea, AGPCNP-BC Palliative Medicine Team  Phone: [phone] Fax: [phone] Pager: [phone] Amion: Bjorn Pippin

## 2019-04-02 NOTE — Discharge Summary (Signed)
[facility] NAME: Jimmy Jimenez    MR#:  [id number]  DATE OF BIRTH:  03/13/1925  DATE OF ADMISSION:  03/31/2019 ADMITTING PHYSICIAN: Jimmy Mormon, MD  DATE OF DISCHARGE: 04/02/2019  PRIMARY CARE PHYSICIAN: Jimmy Mc, MD    ADMISSION DIAGNOSIS:  Colitis [K52.9] Acute colitis [K52.9]  DISCHARGE DIAGNOSIS:  Acute Colitis  SECONDARY DIAGNOSIS:   Past Medical History:  Diagnosis Date  . Colon polyps   . Essential thrombocytosis (Jimmy Jimenez)   . Essential thrombocytosis (Jimmy Jimenez)   . Hyperlipidemia   . Hypertension   . Lung nodule 12/2004   found on CXR  . Osteoporosis     HOSPITAL COURSE:   [name] a86 y.o.African-American malewith a known history of hypertension, dyslipidemia and thrombocytosis, who presented to the emergency room with onset of generalized abdominal pain with associated diarrhea since yesterday which has been loose and watery with minimal amount of blood once without melena. No known history of hemorrhoids.  1.Acute colitis, most likely infectious given significant leukocytosis.  -Sepsis documented on admission--resolved now - continued IV Rocephin and Flagyl--change to oral abxs today (total 7 days) -prnPain management  -serial hemoglobin and hematocrits remain stable -no diarrhea or bloody stool per RN--unable to send stool studies -Stool for c diff (recently in hosp for CVA)--no stool sample given so far -GI consultation by Dr. Allen Jimenez appreciated-- recommends to  finish antibiotic course   2. Hypokalemia.  -K 4.0  - magnesium level 2.0  3. Hypertension.Continue  lisinopril  4. Thrombocytosis. continue hydroxyurea.  5. 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 DVT prophylaxis.  -SCDs.  7. H/o Pancreatic head cystic lesion  --follows with Jimmy Jimenez (last visit  in dec 2020) -wife will make f/u appt  Multiple co-morbidities and recent few admissions in the hospital for different reasons--Palliative care consult for Jimmy Jimenez  Pt overall improving. He is requesting  to go home. D/c plans d/w Jimmy Jimenez in the room.  PT saw pt--HHPT will be resumed at discharge. Out patient Palliative care services to be set up--TOC aware.  Procedures:none Family communication :met with wife at bedside Consults :GI Discharge Disposition : Home with [facility] and outpatient palliative care CODE STATUS: Partial (No to ventilator) DVT Prophylaxis : SCD Barriers to discharge: none CONSULTS OBTAINED:    DRUG ALLERGIES:   Allergies  Allergen Reactions  . Amlodipine     swelling  . Atenolol     swelling  . Erythromycin     Other reaction(s): Hallucination  . Furosemide     Dizziness   . Hctz [Hydrochlorothiazide]     hyponatremia  . Hydrochlorothiazide W-Triamterene     Other reaction(s): Other (See Comments) Patient stated is causes low sodium Patient stated is causes low sodium Patient stated is causes low sodium  Patient stated is causes low sodium   Patient stated is causes low sodium Patient stated is causes low sodium  . Levofloxacin     Causes sleepiness  . Metoprolol     dizziness  . Micardis [Telmisartan]     Causes swelling  . Penicillins Itching    Has patient had a PCN reaction causing immediate rash, facial/tongue/throat swelling, SOB or lightheadedness with hypotension: No Has patient had a PCN reaction causing severe rash involving mucus membranes or skin necrosis: No Has patient had a PCN reaction that required hospitalization  No Has patient had a PCN reaction occurring within the last 10 years: No If all of the above answers are "NO", then may proceed with Cephalosporin use.  . Terazosin     Causes swelling in legs and feet  . Triamterene-Hctz     Patient stated is causes low sodium    DISCHARGE MEDICATIONS:   Allergies as of  04/02/2019      Reactions   Amlodipine    swelling   Atenolol    swelling   Erythromycin    Other reaction(s): Hallucination   Furosemide    Dizziness   Hctz [hydrochlorothiazide]    hyponatremia   Hydrochlorothiazide W-triamterene    Other reaction(s): Other (See Comments) Patient stated is causes low sodium Patient stated is causes low sodium Patient stated is causes low sodium Patient stated is causes low sodium Patient stated is causes low sodium Patient stated is causes low sodium   Levofloxacin    Causes sleepiness   Metoprolol    dizziness   Micardis [telmisartan]    Causes swelling   Penicillins Itching   Has patient had a PCN reaction causing immediate rash, facial/tongue/throat swelling, SOB or lightheadedness with hypotension: No Has patient had a PCN reaction causing severe rash involving mucus membranes or skin necrosis: No Has patient had a PCN reaction that required hospitalization No Has patient had a PCN reaction occurring within the last 10 years: No If all of the above answers are "NO", then may proceed with Cephalosporin use.   Terazosin    Causes swelling in legs and feet   Triamterene-hctz    Patient stated is causes low sodium      Medication List    STOP taking these medications   albuterol 108 (90 Base) MCG/ACT inhaler Commonly known as: VENTOLIN HFA   hydrALAZINE 50 MG tablet Commonly known as: APRESOLINE     TAKE these medications   atorvastatin 10 MG tablet Commonly known as: LIPITOR Take 1 tablet (10 mg total) by mouth daily at 6 PM.   azelastine 0.1 % nasal spray Commonly known as: ASTELIN Place 2 sprays into both nostrils 2 (two) times daily.   benzonatate 200 MG capsule Commonly known as: TESSALON Take 1 capsule (200 mg total) by mouth 2 (two) times daily as needed for cough.   cefdinir 300 MG capsule Commonly known as: OMNICEF Take 1 capsule (300 mg total) by mouth every 12 (twelve) hours for 9 doses.   CITRACAL + D  PO Take 2 tablets by mouth daily. Take 2 by mouth daily, calcium 630 and vitamin D 500 each.   clopidogrel 75 MG tablet Commonly known as: PLAVIX Take 1 tablet (75 mg total) by mouth daily.   clotrimazole-betamethasone cream Commonly known as: LOTRISONE Apply topically 2 (two) times daily.   cyanocobalamin 1000 MCG tablet Take 5,000 mcg by mouth daily. What changed: Another medication with the same name was removed. Continue taking this medication, and follow the directions you see here.   Dermacloud Crea Apply 1 application topically as needed (after each bowel movement for barrier for  pressure ulcer.).   docusate sodium 100 MG capsule Commonly known as: COLACE Take 100 mg by mouth 2 (two) times daily as needed for mild constipation.   esomeprazole 40 MG capsule Commonly known as: NEXIUM TAKE 1 CAPSULE BY MOUTH ONCE DAILY AT NOON   feeding supplement (ENSURE ENLIVE) Liqd Take 237 mLs by mouth 2 (two) times daily between meals.   fluticasone 50 MCG/ACT nasal spray  Commonly known as: FLONASE Use 1-2 sprays in each nostril daily as needed   furosemide 20 MG tablet Commonly known as: LASIX Take 1 tablet (20 mg total) by mouth daily.   hydroxyurea 500 MG capsule Commonly known as: HYDREA TAKE 1 CAPSULE BY MOUTH EVERY OTHER DAY MAY TAKE WITH FOOD TO MINIMIZE GI SIDE EFFECTS.   lisinopril 40 MG tablet Commonly known as: ZESTRIL Take 1 tablet (40 mg total) by mouth at bedtime.   metroNIDAZOLE 500 MG tablet Commonly known as: FLAGYL Take 1 tablet (500 mg total) by mouth every 8 (eight) hours for 14 doses.   testosterone cypionate 200 MG/ML injection Commonly known as: DEPOTESTOSTERONE CYPIONATE INJECT 0.25 ML INTRAMUSCULARLY EVERY 14 DAYS AS DIRECTED   Vitamin D3 10 MCG (400 UNIT) Caps Take 500 Units by mouth daily. Take 2 by mouth daily       If you experience worsening of your admission symptoms, develop shortness of breath, life threatening emergency, suicidal  or homicidal thoughts you must seek medical attention immediately by calling 911 or calling your MD immediately  if symptoms less severe.  You Must read complete instructions/literature along with all the possible adverse reactions/side effects for all the Medicines you take and that have been prescribed to you. Take any new Medicines after you have completely understood and accept all the possible adverse reactions/side effects.   Please note  You were cared for by a hospitalist during your hospital stay. If you have any questions about your discharge medications or the care you received while you were in the hospital after you are discharged, you can call the unit and asked to speak with the hospitalist on call if the hospitalist that took care of you is not available. Once you are discharged, your primary care physician will handle any further medical issues. Please note that NO REFILLS for any discharge medications will be authorized once you are discharged, as it is imperative that you return to your primary care physician (or establish a relationship with a primary care physician if you do not have one) for your aftercare needs so that they can reassess your need for medications and monitor your lab values. Today   SUBJECTIVE   When can I go home?  VITAL SIGNS:  Blood pressure (!) 157/70, pulse 78, temperature 98 F (36.7 C), temperature source Oral, resp. rate 20, height _0  (1.727 m), weight 59.9 kg, SpO2 97 %.  I/O:    Intake/Output Summary (Last 24 hours) at 04/02/2019 1316 Last data filed at 04/02/2019 1003 Gross per 24 hour  Intake 234.12 ml  Output 500 ml  Net -265.88 ml    PHYSICAL EXAMINATION:  GENERAL:  86 y.o.-year-old patient lying in the bed with no acute distress.  EYES: Pupils equal, round, reactive to light and accommodation. No scleral icterus.  HEENT: Head atraumatic, normocephalic. Oropharynx and nasopharynx clear.  NECK:  Supple, no jugular venous distention.  No thyroid enlargement, no tenderness.  LUNGS: Normal breath sounds bilaterally, no wheezing, rales,rhonchi or crepitation. No use of accessory muscles of respiration.  CARDIOVASCULAR: S1, S2 normal. No murmurs, rubs, or gallops.  ABDOMEN: Soft, non-tender, non-distended. Bowel sounds present. No organomegaly or mass.  EXTREMITIES: No pedal edema, cyanosis, or clubbing.  NEUROLOGIC: Cranial nerves II through XII are intact. Muscle strength 5/5 in all extremities. Sensation intact. Gait not checked. weak PSYCHIATRIC: The patient is alert and oriented x 3.  SKIN: No obvious rash, lesion, or ulcer.   DATA REVIEW:   CBC  Recent Labs  Lab 03/31/19 0603 03/31/19 1123 03/31/19 2324  WBC 17.1*  --   --   HGB 12.6*   < > 11.3*  HCT 38.8*   < > 33.9*  PLT 414*  --   --    < > = values in this interval not displayed.    Chemistries  Recent Labs  Lab 03/31/19 0020 03/31/19 0020 03/31/19 0603  NA 137   < > 137  K 3.3*   < > 4.0  CL 93*   < > 95*  CO2 31   < > 32  GLUCOSE 200*   < > 135*  BUN 23   < > 21  CREATININE 1.14   < > 1.04  CALCIUM 9.6   < > 9.0  MG  --   --  2.0  AST 25  --   --   ALT 19  --   --   ALKPHOS 86  --   --   BILITOT 1.3*  --   --    < > = values in this interval not displayed.    Microbiology Results   Recent Results (from the past 240 hour(s))  Respiratory Panel by RT PCR (Flu A&B, Covid) - Nasopharyngeal Swab     Status: None   Collection Time: 03/31/19  1:11 AM   Specimen: Nasopharyngeal Swab  Result Value Ref Range Status   SARS Coronavirus 2 by RT PCR NEGATIVE NEGATIVE Final    Comment: (NOTE) SARS-CoV-2 target nucleic acids are NOT DETECTED. The SARS-CoV-2 RNA is generally detectable in upper respiratoy specimens during the acute phase of infection. The lowest concentration of SARS-CoV-2 viral copies this assay can detect is 131 copies/mL. A negative result does not preclude SARS-Cov-2 infection and should not be used as the sole basis for  treatment or other patient management decisions. A negative result may occur with  improper specimen collection/handling, submission of specimen other than nasopharyngeal swab, presence of viral mutation(s) within the areas targeted by this assay, and inadequate number of viral copies (<131 copies/mL). A negative result must be combined with clinical observations, patient history, and epidemiological information. The expected result is Negative. Fact Sheet for Patients:  PinkCheek.be Fact Sheet for Healthcare Providers:  GravelBags.it This test is not yet ap proved or cleared by the [place] FDA and  has been authorized for detection and/or diagnosis of SARS-CoV-2 by FDA under an Emergency Use Authorization (EUA). This EUA will remain  in effect (meaning this test can be used) for the duration of the COVID-19 declaration under Section 564(b)(1) of the Act, 21 U.S.C. section 360bbb-3(b)(1), unless the authorization is terminated or revoked sooner.    Influenza A by PCR NEGATIVE NEGATIVE Final   Influenza B by PCR NEGATIVE NEGATIVE Final    Comment: (NOTE) The Xpert Xpress SARS-CoV-2/FLU/RSV assay is intended as an aid in  the diagnosis of influenza from Nasopharyngeal swab specimens and  should not be used as a sole basis for treatment. Nasal washings and  aspirates are unacceptable for Xpert Xpress SARS-CoV-2/FLU/RSV  testing. Fact Sheet for Patients: PinkCheek.be Fact Sheet for Healthcare Providers: GravelBags.it This test is not yet approved or cleared by the [place] FDA and  has been authorized for detection and/or diagnosis of SARS-CoV-2 by  FDA under an Emergency Use Authorization (EUA). This EUA will remain  in effect (meaning this test can be used) for the duration of the  Covid-19 declaration under Section 564(b)(1) of the Act, 21  U.S.C. section  360bbb-3(b)(1), unless the authorization is  terminated or revoked. Performed at [facility], [address], [place], [place] [address]     RADIOLOGY:  No results found.   CODE STATUS:     Code Status Orders  (From admission, onward)         Start     Ordered   03/31/19 0456  Limited resuscitation (code)  Continuous    Question Answer Comment  In the event of cardiac or respiratory ARREST: Initiate Code Blue, Call Rapid Response Yes   In the event of cardiac or respiratory ARREST: Perform CPR Yes   In the event of cardiac or respiratory ARREST: Perform Intubation/Mechanical Ventilation No   In the event of cardiac or respiratory ARREST: Use NIPPV/BiPAp only if indicated Yes   In the event of cardiac or respiratory ARREST: Administer ACLS medications if indicated Yes   In the event of cardiac or respiratory ARREST: Perform Defibrillation or Cardioversion if indicated Yes      03/31/19 0459        Code Status History    Date Active Date Inactive Code Status Order ID Comments User Context   03/04/2019 0007 03/08/2019 2333 Full Code [id number]  Kerney Elbe, MD ED   03/02/2019 1012 03/03/2019 2015 Full Code [id number]  Ivor Costa, MD ED   12/07/2014 1034 12/08/2014 1430 Full Code [id number]  Henreitta Leber, MD Inpatient   Advance Care Planning Activity       TOTAL TIME TAKING CARE OF THIS PATIENT: *40* minutes.    Fritzi Mandes M.D  [facility]    CC: Primary care physician; Jimmy Mc, MD

## 2019-04-02 NOTE — Telephone Encounter (Signed)
Pt is being discharged from hospital today.

## 2019-04-02 NOTE — Evaluation (Signed)
Physical Therapy Evaluation Patient Details Name: Jimmy Jimenez MRN: [id number] DOB: 03/13/1925 Today's Date: 04/02/2019   History of Present Illness  Jimmy Jimenez is a 17yoM who comes [facility] abdominal pain with associated diarrhea since yesterday which has been loose and watery. Pt admitted with sepsis.  PMH: hypertension, dyslipidemia, thrombocytosis, recent Left subacute CVA (Jan 2021).  Clinical Impression  Pt admitted with above diagnosis. Pt currently with functional limitations due to the deficits listed below (see "PT Problem List"). Upon entry, pt in bed, awake and agreeable to participate. The pt is alert and oriented x3, pleasant, minimally conversational. History take from wife. Pt requires modA to EOB, modA to rise to standing but does better 2nd attempt from elevated EOB (supervision level). Pt AMB to doorway and back but unable to go further at this time unclear what the limiting factor is although I suspect it was the rhinorrhea which interfered with mobility several times in session. Pt's AMB capacity is slightly less than 1MA at [facility], and wife reports pt to appear weaker than PTA. Functional mobility assessment demonstrates increased effort/time requirements, poor tolerance, and need for physical assistance, whereas the patient performed these at a higher level of independence PTA. Discussed utility of 3-in-1 for toilet riser in the home. Pt will benefit from skilled PT intervention to increase independence and safety with basic mobility in preparation for discharge to the venue listed below.       Follow Up Recommendations Home health PT;Supervision for mobility/OOB    Equipment Recommendations  3in1 (PT);Other (comment)(toilet in home is low.)    Recommendations for Other Services       Precautions / Restrictions Precautions Precautions: Fall Restrictions Weight Bearing Restrictions: No      Mobility  Bed Mobility Overal bed mobility: Needs Assistance Bed  Mobility: Supine to Sit     Supine to sit: Max assist     General bed mobility comments: able to flex turnk and pivot but unable to scoot FWD to EOB  Transfers Overall transfer level: Needs assistance Equipment used: Rolling walker (2 wheeled) Transfers: Sit to/from Stand Sit to Stand: Mod assist         General transfer comment: ModA required for 1st OOB, then able to rise to standing supervision level with slight bed elevation  Ambulation/Gait   Gait Distance (Feet): 50 Feet Assistive device: Rolling walker (2 wheeled) Gait Pattern/deviations: WFL(Within Functional Limits)     General Gait Details: generally stable on feet with RW, but elects to turn around back to bed quite early, does not explain why  Stairs            Wheelchair Mobility    Modified Rankin (Stroke Patients Only)       Balance Overall balance assessment: Mild deficits observed, not formally tested;Modified Independent                                           Pertinent Vitals/Pain Pain Assessment: (denies pain, but has some moderate grimacing/vocalization with adjustment of condom cath)    Home Living Family/patient expects to be discharged to:: Private residence Living Arrangements: Spouse/significant other Available Help at Discharge: Family;Available 24 hours/day Type of Home: House Home Access: Stairs to enter Entrance Stairs-Rails: None Entrance Stairs-Number of Steps: 2-3 Home Layout: One level Home Equipment: Walker - 2 wheels;Grab bars - toilet;Grab bars - tub/shower;Cane - single point Additional Comments: *  needs a BSC; sleeps in recliner;    Prior Function           Comments: has not been driving since at [facility]  in Jan. Household AMB since Jan. Pt has needed help with ADL since Jan, can still feed himself.     Hand Dominance   Dominant Hand: Right    Extremity/Trunk Assessment   Upper Extremity Assessment Upper Extremity Assessment:  Generalized weakness    Lower Extremity Assessment Lower Extremity Assessment: Generalized weakness    Cervical / Trunk Assessment Cervical / Trunk Assessment: Kyphotic  Communication   Communication: No difficulties  Cognition Arousal/Alertness: Awake/alert Behavior During Therapy: WFL for tasks assessed/performed Overall Cognitive Status: Within Functional Limits for tasks assessed                                        General Comments      Exercises     Assessment/Plan    PT Assessment Patient needs continued PT services  PT Problem List Decreased strength;Decreased activity tolerance;Decreased mobility       PT Treatment Interventions DME instruction;Balance training;Stair training;Gait training;Functional mobility training;Therapeutic activities;Therapeutic exercise;Patient/family education    PT Goals (Current goals can be found in the Care Plan section)  Acute Rehab PT Goals Patient Stated Goal: regain strength for return to home. PT Goal Formulation: With patient Time For Goal Achievement: 04/16/19 Potential to Achieve Goals: Good    Frequency Min 2X/week   Barriers to discharge        Co-evaluation               AM-PAC PT "6 Clicks" Mobility  Outcome Measure Help needed turning from your back to your side while in a flat bed without using bedrails?: A Little Help needed moving from lying on your back to sitting on the side of a flat bed without using bedrails?: A Lot Help needed moving to and from a bed to a chair (including a wheelchair)?: A Little Help needed standing up from a chair using your arms (e.g., wheelchair or bedside chair)?: A Little Help needed to walk in hospital room?: A Little Help needed climbing 3-5 steps with a railing? : A Little 6 Click Score: 17    End of Session Equipment Utilized During Treatment: Gait belt Activity Tolerance: Patient limited by fatigue;Patient limited by pain;Patient tolerated  treatment well Patient left: in chair;with family/visitor present;with chair alarm set;with nursing/sitter in room Nurse Communication: Mobility status PT Visit Diagnosis: Difficulty in walking, not elsewhere classified (R26.2);Muscle weakness (generalized) (M62.81)    Time: [id number] PT Time Calculation (min) (ACUTE ONLY): 20 min   Charges:   PT Evaluation $PT Eval Low Complexity: 1 Low          12:26 PM, 04/02/19 Etta Grandchild, PT, DPT Physical Therapist - [facility]  [phone] ([place])    [place] [facility] 04/02/2019, 12:20 PM

## 2019-04-03 ENCOUNTER — Telehealth: Payer: Self-pay

## 2019-04-03 NOTE — Telephone Encounter (Signed)
Unsuccessful attempt to reach patient for transition of care. Hospital follow up appointment scheduled 04/13/19 @ 9:30. Will follow.

## 2019-04-04 ENCOUNTER — Telehealth: Payer: Self-pay | Admitting: Internal Medicine

## 2019-04-04 NOTE — Telephone Encounter (Signed)
Transition Care Management Follow-up Telephone Call  Date of discharge and from where: 04/02/19 from [facility]  How have you been since you were released from the hospital? Wife states patient is weaker since hospital stay. Appetite is good. Loose stool today, no blood. Voiding without issues. Denies ABD pain.   Any questions or concerns? Reports saliva is pooling and would like something to help dry it up if possible.   Items Reviewed:  Did the pt receive and understand the discharge instructions provided? Yes.  Medications obtained and verified? Yes, taking medication without issues. Stopped aspirin, albuterol, hydralazine.   Any new allergies since your discharge? None new.  Dietary orders reviewed? Yes, low sodium, heart healthy.   Do you have support at home? Yes, wife is with him.   Functional Questionnaire: (I = Independent and D = Dependent) ADLs: Wife assists as needed.  Bathing/Dressing- I   Meal Prep- Wife assists  Eating- I  Maintaining continence- I  Transferring/Ambulation- Walker in use  Managing Meds- Wife manages  Follow up appointments reviewed:   PCP Hospital f/u appt confirmed?  Scheduled to see Dr. Derrel Nip on 04/13/19 @ 930, telephone call.  HHPT confirmed?  Not in progress.  Are transportation arrangements needed? None  If their condition worsens, is the pt aware to call PCP or go to the Emergency Dept.? Yes  Was the patient provided with contact information for the PCP's office or ED? Yes  Was to pt encouraged to call back with questions or concerns? Yes

## 2019-04-04 NOTE — Telephone Encounter (Signed)
Spoke with pt's wife and she stated that the pt is complaining to her about how he is having a lot of saliva in his mouth all the time. He was wanting to know if there was something that he could take to help stop it.

## 2019-04-04 NOTE — Telephone Encounter (Signed)
Pt wife called about needing a Rx for pt. Did not give the information she wanted you to call her back. Please advise and thank you!  Call pt @ [phone].

## 2019-04-04 NOTE — Telephone Encounter (Signed)
Attempted to call pt. No answer, no voicemail.  

## 2019-04-04 NOTE — Telephone Encounter (Signed)
No I cannot prescribe something for excessive salivation  Without seeing him becusae I don't know what is causing it

## 2019-04-04 NOTE — Telephone Encounter (Signed)
See other message

## 2019-04-04 NOTE — Telephone Encounter (Signed)
See other note

## 2019-04-05 ENCOUNTER — Telehealth: Payer: Self-pay | Admitting: Internal Medicine

## 2019-04-05 NOTE — Telephone Encounter (Signed)
Spoke with Langley Gauss to let her know that we did not receive the palliative care form. She stated that she is going to re fax it today.

## 2019-04-05 NOTE — Telephone Encounter (Signed)
Langley Gauss called from [place]? about palliative care.She wanted to know if PCP received form for that. Please advise [phone] Option #2

## 2019-04-05 NOTE — Progress Notes (Signed)
LINQ II video visit with wife, patient , and iece present at patient's request. Steri-strips removed from incision site. Wound edges approximated, no redness, edema or drainage from site. Remotes transmitting.  Education done on s/sx of infection and device clinic phone # provided for any questions or concerns.

## 2019-04-06 ENCOUNTER — Other Ambulatory Visit: Payer: Self-pay | Admitting: *Deleted

## 2019-04-06 NOTE — Telephone Encounter (Signed)
Fax has been placed in red folder.

## 2019-04-06 NOTE — Patient Outreach (Signed)
  [place] [facility]) Care Management  04/06/2019  Jimmy Jimenez 03/13/1925 [id number]   [place] coordination related to home health follow up   Voice message from wife, Jimmy Jimenez 1442 04/06/19 stating she has questions and requestd a return call  [place] [place] RN CM returned a call to her She is able to verify HIPAA ([place]) identifiers, date of birth (DOB) and address   [name] inquired about further home health [facility]) Physical therapy (PT) services from [facility] RN CM provided her with well care contact number [phone] [place] CM explained to her (when she asked if [facility] was home health), the differences in [facility] and [facility] She voiced understanding  Jimmy Rimmer L. Lavina Hamman, RN, BSN, [place] Coordinator Office number [phone] Mobile number [phone]  Main [facility] number [phone] Fax number [phone]

## 2019-04-06 NOTE — Telephone Encounter (Signed)
Spoke with pts wife and explained to her that Dr. Derrel Nip cannot prescribe anything without evaluating the pt first. Wife stated that they could wait until his appt on 04/13/2019.

## 2019-04-10 ENCOUNTER — Telehealth: Payer: Self-pay

## 2019-04-10 NOTE — Telephone Encounter (Signed)
Telephone call to patient/family to schedule palliative care visit with patient. Patient/family in agreement with TELEHEALTH visit on 04-11-19 at 1:00PM.

## 2019-04-11 ENCOUNTER — Ambulatory Visit (INDEPENDENT_AMBULATORY_CARE_PROVIDER_SITE_OTHER): Payer: PPO | Admitting: *Deleted

## 2019-04-11 ENCOUNTER — Other Ambulatory Visit: Payer: Self-pay

## 2019-04-11 ENCOUNTER — Other Ambulatory Visit: Payer: PPO

## 2019-04-11 DIAGNOSIS — Z950 Presence of cardiac pacemaker: Secondary | ICD-10-CM

## 2019-04-11 DIAGNOSIS — Z515 Encounter for palliative care: Secondary | ICD-10-CM

## 2019-04-11 NOTE — Progress Notes (Signed)
PATIENT NAME: Jimmy Jimenez DOB: 03/13/1925 MRN: [id number]  PRIMARY CARE PROVIDER: Crecencio Mc, MD  RESPONSIBLE PARTY:  Acct ID - Guarantor Home Phone Work Phone Relationship Acct Type  [id number] [name]  Self P/F     [address], [place], [place] [address]    PLAN OF CARE and INTERVENTIONS:               1.  GOALS OF CARE/ ADVANCE CARE PLANNING:  Remain in home with wife and be comfortable.               2.  PATIENT/CAREGIVER EDUCATION:  Education on fall precautions, education on s/s of infection, reviewed meds, support               3.  DISEASE STATUS: SW and RN completed telephonic visit per wife's request. Wife concerned about Covid-19 and is not letting anyone make home visits. Wife reports patient is eating lunch. Wife states she has to assist patient with eating his meals. Patient's past medical history includes but not limited to hypertension, bradycardia, heart failure, pulmonary hypertension, CHF, history of CVA involving left middle cerebral artery, history of aneurysm in right groin, pulmonary fibrosis, GERD, acute colitis, osteoporosis, history of stage 2 decubitus, hyperlipidemia low serum testosterone, weight loss, mass on pancreas and anemia. Wife reports she has received CoVid 19 vaccine and is attempting to call to get patient a vaccine. Wife reports patient is  complaining of some discomfort in his left arm. Wife feels this is due to her assisting him with standing. Patient has Tylenol in the home to take as needed for pain. Patients current weigh 139 lbs. Wife reports patient has lost a few pounds, patients normal weight ranges between 140 and 150 lbs. Wife reports patient is able to ambulate in the home without walker but if she takes him out she takes walker for patient to use. Patient has not suffered any recent falls.  Wife reports she is willing to let physical therapist make visit to increase patients strength. Patient was hospitalized on January 30th  2021 for acute colitis and was discharged on February 2nd 2021. Wife denies patient having any shortness of breath or cough. Wife reports patients appetite is good if she fixes a meal he likes. Wife reports patient sleeps well at night and he will call out for her to assist him with getting to the bathroom. Wife reports she assists patient with ADLs. Wife in agreement with palliative care services. Patient wishes to be aFull Code but does not want to be placed on ventilator but is open to do CPR. Support provided to wife and nurse reviewed patient's medications with wife. Wife informed palliative care folder would be mailed to her. Wife encouraged to contact palliative care with questions or concerns.    HISTORY OF PRESENT ILLNESS:    CODE STATUS: Full Code/ No vent  ADVANCED DIRECTIVES: No MOST FORM: No PPS: 40%   PHYSICAL EXAM:   VITALS: Today's Vitals   04/11/19 1320  Weight: 139 lb (63 kg)  Height: 5\' 8"  (1.727 m)  PainSc: 2     LUNGS: denied cough or shortness of breath CARDIAC:  EXTREMITIES: no swelling in feet or legs per wife.  SKIN: Wife denies patient having any open areas of skin breakdown.    NEURO: positive for gait problems, memory problems and weakness             Nilda Simmer, RN

## 2019-04-11 NOTE — Progress Notes (Signed)
COMMUNITY PALLIATIVE CARE SW NOTE  PATIENT NAME: Jimmy Jimenez DOB: 03/13/1925 MRN: [id number]  PRIMARY CARE PROVIDER: Crecencio Mc, MD  RESPONSIBLE PARTY:  Acct ID - Guarantor Home Phone Work Phone Relationship Acct Type  [id number] [name] [phone]  Self P/F     [address], [place], [place] [address]     PLAN OF CARE and INTERVENTIONS:             1. GOALS OF CARE/ ADVANCE CARE PLANNING:  Patient does want CPR, does not want intubation or mechanical ventilation. Discussed MOST form, discussion to continue. Goal is for patient get PT restarted and remain at home.  2. SOCIAL/EMOTIONAL/SPIRITUAL ASSESSMENT/ INTERVENTIONS:  SW and RN completed TELEHEALTH visit with patient and [name] (patient's wife). [name] provided brief update and history. Patient reports pain in his left arm, [name] said it is from her using that arm to help pull him up. RN reviewed medications. [name] reports patient's medications are in a pill pack and this helps with administration. No falls reported. Patient is sleeping well, other than getting up to the bathroom. [name] said patient's appetite is good. [name] assists with feeding patient. Patient is retired, lives in the home with his wife. Patient has one son, one grandson and three great grandchildren that live nearby. SW provided education on palliative care services, discussed advance care planning options, provided supportive counseling, discussed goals and used active and reflective listening. 3. PATIENT/CAREGIVER EDUCATION/ COPING:  Patient is alert, oriented and [name] was helping to communicate between patient and team. Patient is forgetful at times. Patient and [name] are "doing the best that they can". Family is supportive but have not been in the home with patient due to COVID-19.  4. PERSONAL EMERGENCY PLAN:  Family will call 9-1-1 for emergencies.  5. COMMUNITY RESOURCES COORDINATION/ HEALTH CARE NAVIGATION:  [name] manages patient's care. [name]  said PT is going to start back soon, she is waiting on a call from them this week. Patient is scheduled for an injection tomorrow and PCP telehealth visit on Friday.  6. FINANCIAL/LEGAL CONCERNS/INTERVENTIONS:  None.     SOCIAL HX:  Social History   Tobacco Use  . Smoking status: Never Smoker  . Smokeless tobacco: Never Used  Substance Use Topics  . Alcohol use: No    CODE STATUS:   Code Status: Prior (FULL CODE) ADVANCED DIRECTIVES: N MOST FORM COMPLETE:  No. HOSPICE EDUCATION PROVIDED: None.  PPS: Patient can ambulate with his walker. Patient needs assistance with feeding, bathing and dressing.  Due to the COVID-19 , this visit was done via telephone from my office and it was initiated and consent by this patient and/or family. This was a scheduled visit.   I spent 30 minutes with patient/family, from 1:00-1:30p providing education, support and consultation.    Margaretmary Lombard, LCSW

## 2019-04-12 ENCOUNTER — Other Ambulatory Visit: Payer: Self-pay

## 2019-04-12 ENCOUNTER — Ambulatory Visit: Payer: PPO

## 2019-04-12 ENCOUNTER — Ambulatory Visit (INDEPENDENT_AMBULATORY_CARE_PROVIDER_SITE_OTHER): Payer: PPO | Admitting: Lab

## 2019-04-12 DIAGNOSIS — R7989 Other specified abnormal findings of blood chemistry: Secondary | ICD-10-CM

## 2019-04-12 LAB — CUP PACEART REMOTE DEVICE CHECK
Date Time Interrogation Session: 20210210185332
Implantable Pulse Generator Implant Date: 20210106

## 2019-04-12 MED ORDER — TESTOSTERONE CYPIONATE 100 MG/ML IM SOLN: 50.0000 mg | Freq: Once | INTRAMUSCULAR | Status: DC

## 2019-04-12 MED ORDER — TESTOSTERONE CYPIONATE 200 MG/ML IM SOLN
50.0000 mg | Freq: Once | INTRAMUSCULAR | Status: AC
  Administered 2019-04-26: 50 mg via INTRAMUSCULAR

## 2019-04-12 MED ORDER — TESTOSTERONE CYPIONATE 200 MG/ML IM SOLN
200.0000 mg | Freq: Once | INTRAMUSCULAR | Status: DC
  Administered 2019-04-12: 200 mg via INTRAMUSCULAR

## 2019-04-12 NOTE — Progress Notes (Addendum)
Pt in office today for Testosterone injection Pt tolerated well   I have reviewed the above information and agree with above.   Deborra Medina, MD

## 2019-04-12 NOTE — Progress Notes (Signed)
ILR Remote 

## 2019-04-13 ENCOUNTER — Encounter: Payer: Self-pay | Admitting: Internal Medicine

## 2019-04-13 ENCOUNTER — Ambulatory Visit (INDEPENDENT_AMBULATORY_CARE_PROVIDER_SITE_OTHER): Payer: PPO | Admitting: Internal Medicine

## 2019-04-13 DIAGNOSIS — Z09 Encounter for follow-up examination after completed treatment for conditions other than malignant neoplasm: Secondary | ICD-10-CM

## 2019-04-13 DIAGNOSIS — K117 Disturbances of salivary secretion: Secondary | ICD-10-CM | POA: Insufficient documentation

## 2019-04-13 DIAGNOSIS — D490 Neoplasm of unspecified behavior of digestive system: Secondary | ICD-10-CM

## 2019-04-13 DIAGNOSIS — K529 Noninfective gastroenteritis and colitis, unspecified: Secondary | ICD-10-CM

## 2019-04-13 NOTE — Progress Notes (Signed)
Telephone Note  This visit type was conducted due to national recommendations for restrictions regarding the COVID-19 pandemic (e.g. social distancing).  This format is felt to be most appropriate for this patient at this time.  All issues noted in this document were discussed and addressed.  No physical exam was performed (except for noted visual exam findings with Video Visits).   I connected with@ on 04/13/19 at  9:30 AM EST by  telephone and verified that I am speaking with the correct person using two identifiers. Location patient: home Location provider: work or home office Persons participating in the virtual visit: patient, provider and wife   I discussed the limitations, risks, security and privacy concerns of performing an evaluation and management service by telephone and the availability of in person appointments. I also discussed with the patient that there may be a patient responsible charge related to this service. The patient expressed understanding and agreed to proceed.  Reason for visit: hospital follow up  HPI: 26 yr ld male with known pancreatic mass,  Presumed CA,  Hypertension admitted to [facility] on Jan 30 with colitis ,  Treated with IV abx and sent home with oral antibiotics for one week which he finished,  Per wife,  Appetite is good in the morning (eats oatmeal) but nothing after that. Has been turning down boost after the first day . He denies abdominal pain and nausea  Patient complaining of post nasal drip that is choking him . Unable to swallow it,  Too thick.  Also feels like he is making too much saliva.   ROS: See pertinent positives and negatives per HPI.  Past Medical History:  Diagnosis Date  . Colon polyps   . Essential thrombocytosis ([place])   . Essential thrombocytosis ([place])   . Hyperlipidemia   . Hypertension   . Lung nodule 12/2004   found on CXR  . Osteoporosis     Past Surgical History:  Procedure Laterality Date  . BONE MARROW BIOPSY  01/18/07   . COLONOSCOPY  2007, 2010   Dr Allen Norris  . IR CT HEAD LTD  03/04/2019  . IR PERCUTANEOUS ART THROMBECTOMY/INFUSION INTRACRANIAL INC DIAG ANGIO  03/04/2019  . IR RADIOLOGIST EVAL & MGMT  03/20/2019  . IR US GUIDE BX ASP/DRAIN  03/07/2019  . IR US GUIDE VASC ACCESS RIGHT  03/04/2019  . LOOP RECORDER INSERTION N/A 03/07/2019   Procedure: LOOP RECORDER INSERTION;  Surgeon: Constance Haw, MD;  Location: [place] [facility];  Service: Cardiovascular;  Laterality: N/A;  . NASAL SINUS SURGERY  06/11/05  . RADIOLOGY WITH ANESTHESIA N/A 03/03/2019   Procedure: IR WITH ANESTHESIA;  Surgeon: Luanne Bras, MD;  Location: [place];  Service: Radiology;  Laterality: N/A;    Family History  Problem Relation Age of Onset  . Colon cancer Mother        colon age 43's  . Peripheral vascular disease Father   . Colon cancer Brother        colon age 71's  . Diabetes Other   . Prostate cancer Neg Hx   . Bladder Cancer Neg Hx   . Kidney cancer Neg Hx     SOCIAL HX:  reports that he has never smoked. He has never used smokeless tobacco. He reports that he does not drink alcohol or use drugs.   Current Outpatient Medications:  .  atorvastatin (LIPITOR) 10 MG tablet, Take 1 tablet (10 mg total) by mouth daily at 6 PM., Disp: 30 tablet,  Rfl: 2 .  azelastine (ASTELIN) 0.1 % nasal spray, Place 2 sprays into both nostrils 2 (two) times daily. , Disp: , Rfl:  .  benzonatate (TESSALON) 200 MG capsule, Take 1 capsule (200 mg total) by mouth 2 (two) times daily as needed for cough., Disp: 20 capsule, Rfl: 0 .  Calcium Citrate-Vitamin D (CITRACAL + D PO), Take 2 tablets by mouth daily. Take 2 by mouth daily, calcium 630 and vitamin D 500 each. , Disp: , Rfl:  .  Cholecalciferol (VITAMIN D3) 10 MCG (400 UNIT) CAPS, Take 500 Units by mouth daily. Take 2 by mouth daily , Disp: , Rfl:  .  clopidogrel (PLAVIX) 75 MG tablet, Take 1 tablet (75 mg total) by mouth daily., Disp: 30 tablet, Rfl: 2 .  clotrimazole-betamethasone  (LOTRISONE) cream, Apply topically 2 (two) times daily., Disp: 90 g, Rfl: 5 .  cyanocobalamin 1000 MCG tablet, Take 5,000 mcg by mouth daily., Disp: , Rfl:  .  docusate sodium (COLACE) 100 MG capsule, Take 100 mg by mouth 2 (two) times daily as needed for mild constipation., Disp: , Rfl:  .  esomeprazole (NEXIUM) 40 MG capsule, TAKE 1 CAPSULE BY MOUTH ONCE DAILY AT NOON, Disp: 90 capsule, Rfl: 3 .  feeding supplement, ENSURE ENLIVE, (ENSURE ENLIVE) LIQD, Take 237 mLs by mouth 2 (two) times daily between meals., Disp: 237 mL, Rfl: 12 .  fluticasone (FLONASE) 50 MCG/ACT nasal spray, Use 1-2 sprays in each nostril daily as needed, Disp: , Rfl:  .  furosemide (LASIX) 20 MG tablet, Take 1 tablet (20 mg total) by mouth daily., Disp: 90 tablet, Rfl: 1 .  hydroxyurea (HYDREA) 500 MG capsule, TAKE 1 CAPSULE BY MOUTH EVERY OTHER DAY MAY TAKE WITH FOOD TO MINIMIZE GI SIDE EFFECTS., Disp: 60 capsule, Rfl: 3 .  Infant Care Products (DERMACLOUD) CREA, Apply 1 application topically as needed (after each bowel movement for barrier for  pressure ulcer.)., Disp: 430 g, Rfl: 2 .  lisinopril (ZESTRIL) 40 MG tablet, Take 1 tablet (40 mg total) by mouth at bedtime., Disp: 90 tablet, Rfl: 0 .  testosterone cypionate (DEPOTESTOSTERONE CYPIONATE) 200 MG/ML injection, INJECT 0.25 ML INTRAMUSCULARLY EVERY 14 DAYS AS DIRECTED, Disp: 10 mL, Rfl: 1  Current Facility-Administered Medications:  .  testosterone cypionate (DEPOTESTOSTERONE CYPIONATE) injection 50 mg, 50 mg, Intramuscular, Once, Crecencio Mc, MD  EXAM:   General impression: alert, cooperative and articulate.  No signs of being in distress  Lungs: speech is fluent sentence length suggests that patient is not short of breath and not punctuated by cough, sneezing or sniffing. [name]   Psych: affect normal.  speech is articulate and non pressured . pleasant and cooperative, no obvious depression or anxiety, speech and thought processing grossly intact  ASSESSMENT AND  PLAN:  Discussed the following assessment and plan:  Hypersalivation  Acute colitis  IPMN (intraductal papillary mucinous neoplasm)  Hospital discharge follow-up  Hypersalivation Recommend Palliative care address with atropine drops and a mucolytic   Acute colitis Treated is infectious by hospital.  Now resolved post 7 days of antibiotics.   IPMN (intraductal papillary mucinous neoplasm) Suspected by oncology based on imaging.  No tissue diagnosis yet .  Plans for EUS/FNA bu [facility] are on hold given recent CVA . Palliative Care consult made during hospitalization.  Family awaiting visit   Hospital discharge follow-up Patient has now had 3 admissions , the first for pulmonary fibrosis exacerbation (COVID 19 AND INFLUENZA NEGATIVE)  Followed by readmission within 24 hours for  CODE STROKE, the third for colitis.   .Patient is stable post discharge and awaiting Palliative Care home visit. [name]  all  questions about discharge plans were addressed at the visit today for hospital follow up. All labs , imaging studies and progress notes from admission were reviewed with patient today      I discussed the assessment and treatment plan with the patient. The patient was provided an opportunity to ask questions and all were answered. The patient agreed with the plan and demonstrated an understanding of the instructions.   The patient was advised to call back or seek an in-person evaluation if the symptoms worsen or if the condition fails to improve as anticipated.  I provided  30 minutes of non-face-to-face time during this encounter reviewing patient's current problems and post surgeries.  Providing counseling on the above mentioned problems , and coordination  of care .  Crecencio Mc, MD

## 2019-04-13 NOTE — Assessment & Plan Note (Signed)
Suspected by oncology based on imaging.  No tissue diagnosis yet .  Plans for EUS/FNA bu [facility] are on hold given recent CVA . Palliative Care consult made during hospitalization.  Family awaiting visit

## 2019-04-13 NOTE — Assessment & Plan Note (Signed)
Treated is infectious by hospital.  Now resolved post 7 days of antibiotics.

## 2019-04-13 NOTE — Assessment & Plan Note (Signed)
Recommend Palliative care address with atropine drops and a mucolytic

## 2019-04-13 NOTE — Assessment & Plan Note (Signed)
Patient has now had 3 admissions , the first for pulmonary fibrosis exacerbation (COVID 19 AND INFLUENZA NEGATIVE)  Followed by readmission within 24 hours for CODE STROKE, the third for colitis.   .Patient is stable post discharge and awaiting Palliative Care home visit. [name]  all  questions about discharge plans were addressed at the visit today for hospital follow up. All labs , imaging studies and progress notes from admission were reviewed with patient today

## 2019-04-16 ENCOUNTER — Ambulatory Visit (INDEPENDENT_AMBULATORY_CARE_PROVIDER_SITE_OTHER): Payer: PPO | Admitting: Pharmacist

## 2019-04-16 ENCOUNTER — Telehealth: Payer: Self-pay | Admitting: Internal Medicine

## 2019-04-16 DIAGNOSIS — K117 Disturbances of salivary secretion: Secondary | ICD-10-CM

## 2019-04-16 DIAGNOSIS — I63412 Cerebral infarction due to embolism of left middle cerebral artery: Secondary | ICD-10-CM

## 2019-04-16 DIAGNOSIS — I5032 Chronic diastolic (congestive) heart failure: Secondary | ICD-10-CM | POA: Diagnosis not present

## 2019-04-16 DIAGNOSIS — I10 Essential (primary) hypertension: Secondary | ICD-10-CM

## 2019-04-16 NOTE — Chronic Care Management (AMB) (Signed)
Chronic Care Management   Follow Up Note   04/16/2019 Name: Jimmy Jimenez MRN: [id number] DOB: 03/13/1925  Referred by: Crecencio Mc, MD Reason for referral : Chronic Care Management (Medication Management)   Jimmy Jimenez is a 86 y.o. year old male who is a primary care patient of Tullo, Aris Everts, MD. The CCM team was consulted for assistance with chronic disease management and care coordination needs.    Contacted patient's wife for medication management review.   Review of patient status, including review of consultants reports, relevant laboratory and other test results, and collaboration with appropriate care team members and the patient's provider was performed as part of comprehensive patient evaluation and provision of chronic care management services.    SDOH (Social Determinants of Health) screening performed today: None. See Care Plan for related entries.   Outpatient Encounter Medications as of 04/16/2019  Medication Sig Note  . Ascorbic Acid (VITAMIN C) 1000 MG tablet Take 1,000 mg by mouth daily.   [name] atorvastatin (LIPITOR) 10 MG tablet Take 1 tablet (10 mg total) by mouth daily at 6 PM.   . Cholecalciferol (VITAMIN D3) 25 MCG (1000 UT) CAPS Take 1,000 Units by mouth daily. Take 2 by mouth daily    . clopidogrel (PLAVIX) 75 MG tablet Take 1 tablet (75 mg total) by mouth daily.   . cyanocobalamin 1000 MCG tablet Take 1,000 mcg by mouth daily.    [name] docusate sodium (COLACE) 100 MG capsule Take 100 mg by mouth 2 (two) times daily as needed for mild constipation.   [name] esomeprazole (NEXIUM) 40 MG capsule TAKE 1 CAPSULE BY MOUTH ONCE DAILY AT NOON   . furosemide (LASIX) 20 MG tablet Take 1 tablet (20 mg total) by mouth daily.   . hydroxyurea (HYDREA) 500 MG capsule TAKE 1 CAPSULE BY MOUTH EVERY OTHER DAY MAY TAKE WITH FOOD TO MINIMIZE GI SIDE EFFECTS.   [name] lisinopril (ZESTRIL) 40 MG tablet Take 1 tablet (40 mg total) by mouth at bedtime.   [name] testosterone cypionate  (DEPOTESTOSTERONE CYPIONATE) 200 MG/ML injection INJECT 0.25 ML INTRAMUSCULARLY EVERY 14 DAYS AS DIRECTED 03/04/2019: Thursdays  . azelastine (ASTELIN) 0.1 % nasal spray Place 2 sprays into both nostrils 2 (two) times daily.    . benzonatate (TESSALON) 200 MG capsule Take 1 capsule (200 mg total) by mouth 2 (two) times daily as needed for cough. 03/22/2019: prn  . clotrimazole-betamethasone (LOTRISONE) cream Apply topically 2 (two) times daily.   . feeding supplement, ENSURE ENLIVE, (ENSURE ENLIVE) LIQD Take 237 mLs by mouth 2 (two) times daily between meals. 03/19/2019: Using Boost right now  . fluticasone (FLONASE) 50 MCG/ACT nasal spray Use 1-2 sprays in each nostril daily as needed   . Infant Care Products (DERMACLOUD) CREA Apply 1 application topically as needed (after each bowel movement for barrier for  pressure ulcer.).   . [DISCONTINUED] Calcium Citrate-Vitamin D (CITRACAL + D PO) Take 2 tablets by mouth daily. Take 2 by mouth daily, calcium 630 and vitamin D 500 each.     Facility-Administered Encounter Medications as of 04/16/2019  Medication  . testosterone cypionate (DEPOTESTOSTERONE CYPIONATE) injection 50 mg     Objective:   Goals Addressed            This Visit's Progress     Patient Stated   . PharmD "We need help with his medications" (pt-stated)       Current Barriers:  . Polypharmacy; complex patient with multiple comorbidities including pancreatic mass, essential thrombocytosis, pulmonary  fibrosis o Multiple recent admissions: [facility] 1/1-1/2 for acute respiratory failure, then [facility] [id number] for embolic CVA, then [facility] [id number] for colitis o Visit w/ PCP last week, discussed increased salivation being bothersome. Dr. Derrel Nip suggested they discuss w/ palliative team. . Wife manages medications. Receiving adherence packaging from [facility] o S/p embolic stroke: clopidogrel 75 mg daily (completed 3 weeks DAPT w/ ASA); atorvastatin 10 mg daily (listed hx statin allergy so  initially voided high intensity statin o HTN: now on lisinopril 40 mg daily, furosemide 20 mg QAM. She notes lisinopril is not in the pill package o Thrombocytopenia: follows w/ Dr. Jacinto Reap at [facility]; hydroxyurea 500 mg every other day o [facility]monary [facility]. No need for inhaler right now o Allergies: azelastine or fluticasone nasal spray PRN  o GERD: esomeprazole 40 mg daily; previously receiving in evening pill package, but wife notes now not in pill package. o Recent dx of pancreatic mass. Suspected cancer.   Pharmacist Clinical Goal(s):  [name] Over the next 90 days, patient will work with PharmD and provider towards optimized medication management  Interventions: . Comprehensive medication review performed; medication list updated in electronic medical record. Reviewed pill pack contents with wife . Encouraged to discuss salivation w/ palliative team. Will message pallative RN, unsure which NP patient may be assigned  . Contacted [facility]. Asked that lisinopril, atorvastatin, and esomeprazole all be placed in the evening pill packages. The pharmacist also noted that ASA was still in the package (though patient's wife did not read that to me from the package); I reiterated that ASA had been d/c. Pharmacist at [facility] made a note of this  . Provided empathetic listening as patient's wife discussed the difficulty of the transition of patient being completely independent, to now being dependent on her.   Patient Self Care Activities:  . Patient will take medications as prescribed . Patient will focus on improved adherence by utilizing pill packs  Please see past updates related to this goal by clicking on the "Past Updates" button in the selected goal          Plan:  - Scheduled f/u call 05/31/19  Catie Darnelle Maffucci, PharmD, BCACP, [place] Pharmacist [place] [phone]

## 2019-04-16 NOTE — Telephone Encounter (Signed)
Pt's wife states that he has a pulled muscle in his neck and would like something called in for it? Please advise.

## 2019-04-16 NOTE — Patient Instructions (Addendum)
Visit Information  Goals Addressed            This Visit's Progress     Patient Stated   . PharmD "We need help with his medications" (pt-stated)       Current Barriers:  . Polypharmacy; complex patient with multiple comorbidities including pancreatic mass, essential thrombocytosis, pulmonary fibrosis o Multiple recent admissions: [facility] 1/1-1/2 for acute respiratory failure, then [facility] [id number] for embolic CVA, then [facility] [id number] for colitis o Visit w/ PCP last week, discussed increased salivation being bothersome. Dr. Derrel Nip suggested they discuss w/ palliative team. . Wife manages medications. Receiving adherence packaging from [facility] o S/p embolic stroke: clopidogrel 75 mg daily (completed 3 weeks DAPT w/ ASA); atorvastatin 10 mg daily (listed hx statin allergy so initially voided high intensity statin o HTN: now on lisinopril 40 mg daily, furosemide 20 mg QAM. She notes lisinopril is not in the pill package o Thrombocytopenia: follows w/ Dr. Jacinto Reap at [facility]; hydroxyurea 500 mg every other day o [facility]monary [facility]. No need for inhaler right now o Allergies: azelastine or fluticasone nasal spray PRN  o GERD: esomeprazole 40 mg daily; previously receiving in evening pill package, but wife notes now not in pill package. o Recent dx of pancreatic mass. Suspected cancer.   Pharmacist Clinical Goal(s):  [name] Over the next 90 days, patient will work with PharmD and provider towards optimized medication management  Interventions: . Comprehensive medication review performed; medication list updated in electronic medical record. Reviewed pill pack contents with wife . Encouraged to discuss salivation w/ palliative team. Will message pallative RN, unsure which NP patient may be assigned  . Contacted [facility]. Asked that lisinopril, atorvastatin, and esomeprazole all be placed in the evening pill packages. The pharmacist also noted that ASA was still in the  package (though patient's wife did not read that to me from the package); I reiterated that ASA had been d/c. Pharmacist at [facility] made a note of this  . Provided empathetic listening as patient's wife discussed the difficulty of the transition of patient being completely independent, to now being dependent on her.   Patient Self Care Activities:  . Patient will take medications as prescribed . Patient will focus on improved adherence by utilizing pill packs  Please see past updates related to this goal by clicking on the "Past Updates" button in the selected goal         The patient verbalized understanding of instructions provided today and declined a print copy of patient instruction materials.   Plan:  - Scheduled f/u call 05/31/19  Catie Darnelle Maffucci, PharmD, BCACP, CPP Clinical Pharmacist [place] [phone]

## 2019-04-18 ENCOUNTER — Encounter: Payer: Self-pay | Admitting: Adult Health

## 2019-04-18 ENCOUNTER — Other Ambulatory Visit: Payer: Self-pay

## 2019-04-18 ENCOUNTER — Ambulatory Visit: Payer: PPO | Admitting: Adult Health

## 2019-04-18 VITALS — BP 129/73 | HR 88 | Temp 97.3°F | Ht 68.0 in | Wt 139.8 lb

## 2019-04-18 DIAGNOSIS — E785 Hyperlipidemia, unspecified: Secondary | ICD-10-CM

## 2019-04-18 DIAGNOSIS — I63412 Cerebral infarction due to embolism of left middle cerebral artery: Secondary | ICD-10-CM | POA: Diagnosis not present

## 2019-04-18 DIAGNOSIS — I729 Aneurysm of unspecified site: Secondary | ICD-10-CM

## 2019-04-18 DIAGNOSIS — I10 Essential (primary) hypertension: Secondary | ICD-10-CM

## 2019-04-18 NOTE — Telephone Encounter (Signed)
Spoke with pt's wife and informed her that Dr. Derrel Nip would like for the pt to try using ice for 15 minutes every 3 to 4 hours and tylenol 1000 mg every 12 hours. Pt's wife gave a verbal understanding.

## 2019-04-18 NOTE — Patient Instructions (Signed)
Continue clopidogrel 75 mg daily  and lipitor  for secondary stroke prevention  Continue to follow up with PCP regarding cholesterol and blood pressure management   Restart working with therapy at home for hopeful improvement of your walking  Continue to monitor blood pressure at home  Maintain strict control of hypertension with blood pressure goal below 130/90, diabetes with hemoglobin A1c goal below 6.5% and cholesterol with LDL cholesterol (bad cholesterol) goal below 70 mg/dL. I also advised the patient to eat a healthy diet with plenty of whole grains, cereals, fruits and vegetables, exercise regularly and maintain ideal body weight.  Followup in the future with me in 3 months or call earlier if needed       Thank you for coming to see [place] at [facility]. I hope we have been able to provide you high quality care today.  You may receive a patient satisfaction survey over the next few weeks. We would appreciate your feedback and comments so that we may continue to improve ourselves and the health of our patients.

## 2019-04-18 NOTE — Progress Notes (Signed)
[facility] [address] [place]. [place] [address] [phone]       HOSPITAL FOLLOW UP NOTE  [name] Date of Birth:  03/13/1925 Medical Record Number:  [id number]   Reason for Referral:  hospital stroke follow up    CHIEF COMPLAINT:  Chief Complaint  Patient presents with  . Follow-up    RM9. with wife. Pt states that he can barely moved and is concerned with his mobility.    HPI: Jimmy Jimenez being seen today for in office hospital follow-up regarding left MCA infarcts secondary to unknown source on 03/03/2019.  History obtained from patient, wife and chart review. Reviewed all radiology images and labs personally.  Jimmy Jimenez a 86 y.o.malewith history of chronic diastolic CHF, pulmonary fibrosis, pulmonary HTN, hx of lung nodule (2006), essential thrombocytosis (on hydroxyurea), HLD, HTN and recent acute on chronic respiratory failure with hypoxia treated at [facility]  presented to [place]. [facility] later that day with intermittent aphasia and right sided weakness on 03/03/2019.  Evaluated by stroke team and Dr. Leonie Man with stroke work-up revealing left MCA infarcts s/p TPA and mechanical thrombectomy of left M1 occlusion secondary to unknown source. Did really well post IR except for mild groin bleeding. Found to have a small pseudoaneurysm which was injected with thrombin.  In addition to left M1 occlusion, CTA head/neck showed heavily calcified plaque throughout the carotid siphons with associated moderate to severe multifocal narrowing, short segment severe proximal right M2 and distal left P2 stenosis and approximate 50% atheromatous stenoses at the origin of the right vertebral artery as well as involving the left V4 segment.  2D echo unremarkable.  TEE not recommended due to advanced age and loop placed to look for atrial fibrillation as possible source of stroke.  Was advised to follow-up with Dr. Earleen Newport  outpatient for right groin pseudoaneurysm.  Recommended DAPT for 3 weeks then Plavix alone.  History of HTN and recommended continuation of lisinopril and hydralazine.  LDL 104 and initiated atorvastatin 10 mg daily.  No history or evidence of DM with A1c 4.8.  Other stroke risk factors include advanced age and chronic diastolic CHF on Lasix.  No prior history of stroke.  He was discharged home in stable condition.  Jimmy Jimenez is a 86 year old male who is being seen today for stroke follow-up accompanied by his wife.  He continues to have right hemiparesis but does endorse improvement.  Previously participating with home health therapy but placed on hold due to hospitalization on 03/31/2019 for treatment of colitis.  He has not restarted therapy at this time but currently in the process of restarting.  Currently being followed by palliative care.  Limited mobility at home ambulating short distance with rolling walker and uses wheelchair for long distance.  Completed 3 weeks DAPT and continues on Plavix alone without bleeding or bruising.  Continues on atorvastatin without myalgias.  Blood pressure today 129/73.  He did have recent follow-up with IR in regards to left MCA thrombectomy and right CFA pseudoaneurysm.  Patient's greatest complaint on today's visit is regarding 1 week onset of right-sided neck pain which presented upon awakening.  No other associated symptoms and is requesting pain medications at today's visit.  No further concerns at this time.        ROS:   14 system review of systems performed and negative with exception of edema, pain, weakness  PMH:  Past Medical History:  Diagnosis Date  . Colon  polyps   . Essential thrombocytosis ([place])   . Essential thrombocytosis ([place])   . Hyperlipidemia   . Hypertension   . Lung nodule 12/2004   found on CXR  . Osteoporosis     PSH:  Past Surgical History:  Procedure Laterality Date  . BONE MARROW BIOPSY  01/18/07  . COLONOSCOPY   2007, 2010   Dr Allen Norris  . IR CT HEAD LTD  03/04/2019  . IR PERCUTANEOUS ART THROMBECTOMY/INFUSION INTRACRANIAL INC DIAG ANGIO  03/04/2019  . IR RADIOLOGIST EVAL & MGMT  03/20/2019  . IR US GUIDE BX ASP/DRAIN  03/07/2019  . IR US GUIDE VASC ACCESS RIGHT  03/04/2019  . LOOP RECORDER INSERTION N/A 03/07/2019   Procedure: LOOP RECORDER INSERTION;  Surgeon: Constance Haw, MD;  Location: [facility];  Service: Cardiovascular;  Laterality: N/A;  . NASAL SINUS SURGERY  06/11/05  . RADIOLOGY WITH ANESTHESIA N/A 03/03/2019   Procedure: IR WITH ANESTHESIA;  Surgeon: Luanne Bras, MD;  Location: [place];  Service: Radiology;  Laterality: N/A;    Social History:  Social History   Socioeconomic History  . Marital status: Married    Spouse name: Not on file  . Number of children: Not on file  . Years of education: Not on file  . Highest education level: Not on file  Occupational History  . Occupation: Retired    Comment: former Nature conservation officer, [employer]  Tobacco Use  . Smoking status: Never Smoker  . Smokeless tobacco: Never Used  Substance and Sexual Activity  . Alcohol use: No  . Drug use: No  . Sexual activity: Not Currently  Other Topics Concern  . Not on file  Social History Narrative  . Not on file   Social Determinants of Health   Financial Resource Strain: Low Risk   . Difficulty of Paying Living Expenses: Not hard at all  Food Insecurity: No Food Insecurity  . Worried About Charity fundraiser in the Last Year: Never true  . Ran Out of Food in the Last Year: Never true  Transportation Needs: No Transportation Needs  . Lack of Transportation (Medical): No  . Lack of Transportation (Non-Medical): No  Physical Activity:   . Days of Exercise per Week: Not on file  . Minutes of Exercise per Session: Not on file  Stress: No Stress Concern Present  . Feeling of Stress : Not at all  Social Connections: Not Isolated  . Frequency of Communication with Friends and Family: More than three  times a week  . Frequency of Social Gatherings with Friends and Family: More than three times a week  . Attends Religious Services: 1 to 4 times per year  . Active Member of Clubs or Organizations: Yes  . Attends Archivist Meetings: 1 to 4 times per year  . Marital Status: Married  Human resources officer Violence:   . Fear of Current or Ex-Partner: Not on file  . Emotionally Abused: Not on file  . Physically Abused: Not on file  . Sexually Abused: Not on file    Family History:  Family History  Problem Relation Age of Onset  . Colon cancer Mother        colon age 1's  . Peripheral vascular disease Father   . Colon cancer Brother        colon age 80's  . Diabetes Other   . Prostate cancer Neg Hx   . Bladder Cancer Neg Hx   . Kidney cancer Neg Hx  Medications:   Current Outpatient Medications on File Prior to Visit  Medication Sig Dispense Refill  . Ascorbic Acid (VITAMIN C) 1000 MG tablet Take 1,000 mg by mouth daily.    [name] atorvastatin (LIPITOR) 10 MG tablet Take 1 tablet (10 mg total) by mouth daily at 6 PM. 30 tablet 2  . azelastine (ASTELIN) 0.1 % nasal spray Place 2 sprays into both nostrils 2 (two) times daily.     . benzonatate (TESSALON) 200 MG capsule Take 1 capsule (200 mg total) by mouth 2 (two) times daily as needed for cough. 20 capsule 0  . Cholecalciferol (VITAMIN D3) 25 MCG (1000 UT) CAPS Take 1,000 Units by mouth daily. Take 2 by mouth daily     . clopidogrel (PLAVIX) 75 MG tablet Take 1 tablet (75 mg total) by mouth daily. 30 tablet 2  . clotrimazole-betamethasone (LOTRISONE) cream Apply topically 2 (two) times daily. 90 g 5  . cyanocobalamin 1000 MCG tablet Take 1,000 mcg by mouth daily.     [name] docusate sodium (COLACE) 100 MG capsule Take 100 mg by mouth 2 (two) times daily as needed for mild constipation.    [name] esomeprazole (NEXIUM) 40 MG capsule TAKE 1 CAPSULE BY MOUTH ONCE DAILY AT NOON 90 capsule 3  . feeding supplement, ENSURE ENLIVE, (ENSURE  ENLIVE) LIQD Take 237 mLs by mouth 2 (two) times daily between meals. 237 mL 12  . furosemide (LASIX) 20 MG tablet Take 1 tablet (20 mg total) by mouth daily. 90 tablet 1  . hydroxyurea (HYDREA) 500 MG capsule TAKE 1 CAPSULE BY MOUTH EVERY OTHER DAY MAY TAKE WITH FOOD TO MINIMIZE GI SIDE EFFECTS. 60 capsule 3  . Infant Care Products (DERMACLOUD) CREA Apply 1 application topically as needed (after each bowel movement for barrier for  pressure ulcer.). 430 g 2  . lisinopril (ZESTRIL) 40 MG tablet Take 1 tablet (40 mg total) by mouth at bedtime. 90 tablet 0  . testosterone cypionate (DEPOTESTOSTERONE CYPIONATE) 200 MG/ML injection INJECT 0.25 ML INTRAMUSCULARLY EVERY 14 DAYS AS DIRECTED 10 mL 1   Current Facility-Administered Medications on File Prior to Visit  Medication Dose Route Frequency Provider Last Rate Last Admin  . testosterone cypionate (DEPOTESTOSTERONE CYPIONATE) injection 50 mg  50 mg Intramuscular Once Crecencio Mc, MD        Allergies:   Allergies  Allergen Reactions  . Amlodipine     swelling  . Atenolol     swelling  . Erythromycin     Other reaction(s): Hallucination  . Furosemide     Dizziness   . Hctz [Hydrochlorothiazide]     hyponatremia  . Hydrochlorothiazide W-Triamterene     Other reaction(s): Other (See Comments) Patient stated is causes low sodium Patient stated is causes low sodium Patient stated is causes low sodium  Patient stated is causes low sodium   Patient stated is causes low sodium Patient stated is causes low sodium  . Levofloxacin     Causes sleepiness  . Metoprolol     dizziness  . Micardis [Telmisartan]     Causes swelling  . Penicillins Itching    Has patient had a PCN reaction causing immediate rash, facial/tongue/throat swelling, SOB or lightheadedness with hypotension: No Has patient had a PCN reaction causing severe rash involving mucus membranes or skin necrosis: No Has patient had a PCN reaction that required  hospitalization No Has patient had a PCN reaction occurring within the last 10 years: No If all of the above answers are "  NO", then may proceed with Cephalosporin use.  . Terazosin     Causes swelling in legs and feet  . Triamterene-Hctz     Patient stated is causes low sodium     Physical Exam  Vitals:   04/18/19 1105  BP: 129/73  Pulse: 88  Temp: (!) 97.3 F (36.3 C)  Weight: 139 lb 12.8 oz (63.4 kg)  Height: 5' 8" (1.727 m)   Body mass index is 21.26 kg/m. No exam data present  General: Frail pleasant elderly African-American male, seated, in no evident distress Head: head normocephalic and atraumatic.   Neck: supple with no carotid or supraclavicular bruits Cardiovascular: regular rate and rhythm, no murmurs Musculoskeletal: no deformity Skin:  no rash/petichiae Vascular:  Normal pulses all extremities; +4 pitting edema bilateral lower extremities   Neurologic Exam Mental Status: Awake and fully alert.   Mild dysarthria.  Oriented to place and time. Recent and remote memory intact. Attention span, concentration and fund of knowledge appropriate. Mood and affect appropriate.  Cranial Nerves: Fundoscopic exam reveals sharp disc margins. Pupils equal, briskly reactive to light. Extraocular movements full without nystagmus. Visual fields full to confrontation. Hearing intact. Facial sensation intact. Face, tongue, palate moves normally and symmetrically.  Motor: Normal bulk and tone. Normal strength in all tested extremity muscles except mild right hip flexor weakness. Sensory.: intact to touch , pinprick , position and vibratory sensation.  Coordination: Rapid alternating movements normal in all extremities. Finger-to-nose performed accurately bilaterally and heel-to-shin difficulty performing. Gait and Station: Deferred as rolling walker not present during visit Reflexes: 1+ and symmetric. Toes downgoing.     NIHSS  1 Modified Rankin  3    Diagnostic Data (Labs,  Imaging, Testing)   Code Stroke CT Head - Asymmetric hyperdensity involving the distal left M1 and/or proximal M2 branch, concerning for LVO. Subtle hypodensity involving the left caudate concerning for evolving left MCA territory ischemia. No intracranial hemorrhage. ASPECTS is 9. Underlying age-related cerebral atrophy with moderate chronic small vessel ischemic disease.   CTA H&N - Positive CTA for LVO with occlusive thrombus within a proximal left M2 branch, superior division. Heavy calcified plaque throughout the carotid siphons with associated moderate to severe multifocal narrowing. Short-segment severe proximal right M2 and distal left P2 stenoses. Approximate 50% atheromatous stenoses at the origin of the right vertebral artery as well as involving the left V4 segmen  CT Perfusion- 11 cc acute core infarct involving the deep white matter of the left cerebral hemisphere, watershed in distribution. Surrounding 55 cc ischemic penumbra involving much of the left MCA.   Cerebral angio L M1 occlusion w/ revascularization   MRI head -L MCA infarcts (caudate head and lentiform nucleus). Small vessel disease. Atrophy.  2D Echo - EF 65-70%. No source of embolus  No TEE d/t advanced age  [facility] electrophysiologist consulted and placed an implantable loop recorder to evaluate for atrial fibrillation as etiology of stroke 03/07/19 ([place]).   Sars Corona Virus 2 - negative  LDL - 87  HgbA1c- 4.8    ASSESSMENT: Jimmy Jimenez is a 86 y.o. year old male presented with intermittent aphasia and right-sided weakness on 03/03/2019 with stroke work-up revealing left MCA infarct status post TPA and successful IR secondary to unknown source with loop recorder placed. Vascular risk factors include chronic diastolic CHF, HTN, HLD, right groin pseudoaneurysm.  Residual stroke deficits of mild right lower extremity weakness    PLAN:  1. Left MCA stroke: Continue  clopidogrel   75 mg daily  and atorvastatin for secondary stroke prevention.  Continue to monitor loop recorder for possible atrial fibrillation.  Maintain strict control of hypertension with blood pressure goal below 130/90, diabetes with hemoglobin A1c goal below 6.5% and cholesterol with LDL cholesterol (bad cholesterol) goal below 70 mg/dL.  I also advised the patient to eat a healthy diet with plenty of whole grains, cereals, fruits and vegetables, exercise regularly with at least 30 minutes of continuous activity daily and maintain ideal body weight. 2. HTN: Advised to continue current treatment regimen.  Advised to continue to monitor at home along with continued follow-up with PCP for management 3. HLD: Advised to continue current treatment regimen along with continued follow-up with PCP for future prescribing and monitoring of lipid panel 4. Right hemiparesis, poststroke: Highly encouraged restart of therapy for residual deficits and likely deconditioning causing difficulty with ambulation 5. Right groin pseudoaneurysm: Ongoing follow-up with IR for monitoring and management 6. Right-sided neck pain: Appears to be muscle skeletal related.  Discussion regarding use of moist heat and Tylenol as needed and to discuss further interventions with PCP    Follow up in 3 months or call earlier if needed   Greater than 50% of time during this 45 minute visit was spent on counseling, explanation of diagnosis of left MCA stroke, reviewing risk factor management of HTN, HLD, CHF and right groin pseudoaneurysm, planning of further management along with potential future management, and discussion with patient and family answering all questions.    Frann Rider, AGNP-BC  [facility] [address] [place], [place] [address]  Phone [phone] Fax [phone] Note: This document was prepared with digital dictation and possible smart phrase technology. Any  transcriptional errors that result from this process are unintentional.

## 2019-04-18 NOTE — Telephone Encounter (Signed)
He does not need a prescription medication for a pulled neck muscle,  If that's what is truly going on,  He can use ice for 15 minutes every 3 t 4 hours  And tylenol 1000 mg every 12 hours   If this does not help, needs appt

## 2019-04-19 ENCOUNTER — Ambulatory Visit: Payer: PPO | Admitting: *Deleted

## 2019-04-19 NOTE — Progress Notes (Signed)
I agree with the above plan 

## 2019-04-24 ENCOUNTER — Encounter: Payer: Self-pay | Admitting: *Deleted

## 2019-04-24 ENCOUNTER — Ambulatory Visit
Admission: RE | Admit: 2019-04-24 | Discharge: 2019-04-24 | Disposition: A | Discharge status: Home / Self Care | Payer: PPO | Source: Ambulatory Visit | Attending: Interventional Radiology | Admitting: Interventional Radiology

## 2019-04-24 DIAGNOSIS — I729 Aneurysm of unspecified site: Secondary | ICD-10-CM

## 2019-04-24 HISTORY — PX: IR RADIOLOGIST EVAL & MGMT: IMG5224

## 2019-04-24 NOTE — Progress Notes (Signed)
Chief Complaint: RCFA pseudoaneurysm  Referring Physician(s): Shyane Fossum  History of Present Illness: Jimmy Jimenez is a 86 y.o. male presenting today as a scheduled follow up with [place] [facility], SP emergent thrombectomy of left MCA ELVO, complicated by a delayed right CFA pseudoaneurysm at the access site.   He is here today in the clinic with his wife.   His RCFA pseudoaneurysm was treated with US guided thrombin injection 03/07/19.  We have had 2 follow up US studies which show small residual patency.  Today's repeat duplex shows complete thrombosis.    Since our prior visit, he had admission to the hospital for GI symptoms, including diarrhea and abdominal pain at [facility].  No intervention performed, simply adjusting his medication.  He was seen in house by GI, with diagnosis of ischemic colitis.   He did have a follow up visit with [facility] 04/18/2019.   His wife tells me that they will be restarting his PT soon.      Past Medical History:  Diagnosis Date  . Colon polyps   . Essential thrombocytosis ([place])   . Essential thrombocytosis ([place])   . Hyperlipidemia   . Hypertension   . Lung nodule 12/2004   found on CXR  . Osteoporosis     Past Surgical History:  Procedure Laterality Date  . BONE MARROW BIOPSY  01/18/07  . COLONOSCOPY  2007, 2010   Dr Allen Norris  . IR CT HEAD LTD  03/04/2019  . IR PERCUTANEOUS ART THROMBECTOMY/INFUSION INTRACRANIAL INC DIAG ANGIO  03/04/2019  . IR RADIOLOGIST EVAL & MGMT  03/20/2019  . IR US GUIDE BX ASP/DRAIN  03/07/2019  . IR US GUIDE VASC ACCESS RIGHT  03/04/2019  . LOOP RECORDER INSERTION N/A 03/07/2019   Procedure: LOOP RECORDER INSERTION;  Surgeon: Constance Haw, MD;  Location: [place] [facility];  Service: Cardiovascular;  Laterality: N/A;  . NASAL SINUS SURGERY  06/11/05  . RADIOLOGY WITH ANESTHESIA N/A 03/03/2019   Procedure: IR WITH ANESTHESIA;  Surgeon: Luanne Bras, MD;  Location: [place];  Service: Radiology;  Laterality:  N/A;    Allergies: Amlodipine, Atenolol, Erythromycin, Furosemide, Hctz [hydrochlorothiazide], Hydrochlorothiazide w-triamterene, Levofloxacin, Metoprolol, Micardis [telmisartan], Penicillins, Terazosin, and Triamterene-hctz  Medications: Prior to Admission medications   Medication Sig Start Date End Date Taking? Authorizing Provider  Ascorbic Acid (VITAMIN C) 1000 MG tablet Take 1,000 mg by mouth daily.    [provider]  atorvastatin (LIPITOR) 10 MG tablet Take 1 tablet (10 mg total) by mouth daily at 6 PM. 03/08/19   Donzetta Starch, NP  azelastine (ASTELIN) 0.1 % nasal spray Place 2 sprays into both nostrils 2 (two) times daily.  02/10/19   [provider]  benzonatate (TESSALON) 200 MG capsule Take 1 capsule (200 mg total) by mouth 2 (two) times daily as needed for cough. 01/04/19   Crecencio Mc, MD  Cholecalciferol (VITAMIN D3) 25 MCG (1000 UT) CAPS Take 1,000 Units by mouth daily. Take 2 by mouth daily     [provider]  clopidogrel (PLAVIX) 75 MG tablet Take 1 tablet (75 mg total) by mouth daily. 03/09/19   Donzetta Starch, NP  clotrimazole-betamethasone (LOTRISONE) cream Apply topically 2 (two) times daily. 11/28/18   Crecencio Mc, MD  cyanocobalamin 1000 MCG tablet Take 1,000 mcg by mouth daily.     [provider]  docusate sodium (COLACE) 100 MG capsule Take 100 mg by mouth 2 (two) times daily as needed for mild constipation.  [provider]  esomeprazole (NEXIUM) 40 MG capsule TAKE 1 CAPSULE BY MOUTH ONCE DAILY AT NOON 11/27/18   Crecencio Mc, MD  feeding supplement, ENSURE ENLIVE, (ENSURE ENLIVE) LIQD Take 237 mLs by mouth 2 (two) times daily between meals. 03/08/19   Donzetta Starch, NP  furosemide (LASIX) 20 MG tablet Take 1 tablet (20 mg total) by mouth daily. 03/22/19   Crecencio Mc, MD  hydroxyurea (HYDREA) 500 MG capsule TAKE 1 CAPSULE BY MOUTH EVERY OTHER DAY MAY TAKE WITH FOOD TO MINIMIZE GI SIDE EFFECTS. 08/08/18    Cammie Sickle, MD  Infant Care Products ([place]) CREA Apply 1 application topically as needed (after each bowel movement for barrier for  pressure ulcer.). 02/15/19   Crecencio Mc, MD  lisinopril (ZESTRIL) 40 MG tablet Take 1 tablet (40 mg total) by mouth at bedtime. 03/22/19   Crecencio Mc, MD  testosterone cypionate (DEPOTESTOSTERONE CYPIONATE) 200 MG/ML injection INJECT 0.25 ML INTRAMUSCULARLY EVERY 14 DAYS AS DIRECTED 02/16/19   Crecencio Mc, MD     Family History  Problem Relation Age of Onset  . Colon cancer Mother        colon age 73's  . Peripheral vascular disease Father   . Colon cancer Brother        colon age 51's  . Diabetes Other   . Prostate cancer Neg Hx   . Bladder Cancer Neg Hx   . Kidney cancer Neg Hx     Social History   Socioeconomic History  . Marital status: Married    Spouse name: Not on file  . Number of children: Not on file  . Years of education: Not on file  . Highest education level: Not on file  Occupational History  . Occupation: Retired    Comment: former Nature conservation officer, [employer]  Tobacco Use  . Smoking status: Never Smoker  . Smokeless tobacco: Never Used  Substance and Sexual Activity  . Alcohol use: No  . Drug use: No  . Sexual activity: Not Currently  Other Topics Concern  . Not on file  Social History Narrative  . Not on file   Social Determinants of Health   Financial Resource Strain: Low Risk   . Difficulty of Paying Living Expenses: Not hard at all  Food Insecurity: No Food Insecurity  . Worried About Charity fundraiser in the Last Year: Never true  . Ran Out of Food in the Last Year: Never true  Transportation Needs: No Transportation Needs  . Lack of Transportation (Medical): No  . Lack of Transportation (Non-Medical): No  Physical Activity:   . Days of Exercise per Week: Not on file  . Minutes of Exercise per Session: Not on file  Stress: No Stress Concern Present  . Feeling of Stress : Not at all  Social  Connections: Not Isolated  . Frequency of Communication with Friends and Family: More than three times a week  . Frequency of Social Gatherings with Friends and Family: More than three times a week  . Attends Religious Services: 1 to 4 times per year  . Active Member of Clubs or Organizations: Yes  . Attends Archivist Meetings: 1 to 4 times per year  . Marital Status: Married       Review of Systems: A 12 point ROS discussed and pertinent positives are indicated in the HPI above.  All other systems are negative.  Review of Systems  Vital Signs: BP (!) 149/66 (BP  Location: Left Arm)   Pulse 98   Temp 98 F (36.7 C)   SpO2 95%   Physical Exam Pulses intact in the lower extremity bilateral.  No pulsatile mass.   Bilateral pitting edema.     Imaging: CT Angio Chest PE W and/or Wo Contrast  Result Date: 03/31/2019 CLINICAL DATA:  Intermittent shortness of breath, lower abdominal pain and fever with diarrhea for 4 days EXAM: CT ANGIOGRAPHY CHEST CT ABDOMEN AND PELVIS WITH CONTRAST TECHNIQUE: Multidetector CT imaging of the chest was performed using the standard protocol during bolus administration of intravenous contrast. Multiplanar CT image reconstructions and MIPs were obtained to evaluate the vascular anatomy. Multidetector CT imaging of the abdomen and pelvis was performed using the standard protocol during bolus administration of intravenous contrast. CONTRAST:  1105m OMNIPAQUE IOHEXOL 350 MG/ML SOLN COMPARISON:  CTA chest 03/02/2019 Jimmy abdomen 12/22/2018, CT abdomen pelvis 08/31/2016-2 FINDINGS: CTA CHEST FINDINGS Cardiovascular: Satisfactory opacification the pulmonary arteries to the segmental level. No pulmonary artery filling defects are identified. There is borderline enlargement of the central pulmonary arteries which is similar to comparison CT. Atherosclerotic calcification of the normal caliber thoracic aorta with some moderate tortuosity. Shared origin of  brachiocephalic and left common carotid arteries. Proximal great vessels are unremarkable. Overall normal cardiac size with some right atrial ventricular dilatation. Coronary artery calcifications are present. No pericardial effusion. Mediastinum/Nodes: Few scattered low-attenuation partially calcified mediastinal and hilar nodes are similar to prior. No suspicious adenopathy. No acute abnormality of the trachea or esophagus. Thyroid gland and thoracic inlet are unremarkable. Lungs/Pleura: There is bilateral subpleural reticulation in a basilar predominance with stacked cyst formation in the lung periphery. More dense opacity in the periphery of the right lung base may reflect some pulmonary microlithiasis which is unchanged from multiple priors. No superimposed areas of acute airspace disease are seen. There is a 1.2 cm subpleural nodule in the right upper lobe (4/43). This is unchanged from comparisons as remote is 2018 no new nodules are seen. No pneumothorax or effusion. Musculoskeletal: Multilevel degenerative changes are present in the imaged portions of the spine. No acute osseous abnormality or suspicious osseous lesion. Vertebral hemangioma at T10 is unchanged from priors. No worrisome chest wall lesions. Review of the MIP images confirms the above findings. CT ABDOMEN and PELVIS FINDINGS Hepatobiliary: No focal liver abnormality is seen. No gallstones, gallbladder wall thickening, or biliary dilatation. Interposition of the hepatic flexure anterior to the liver. This configuration is similar to comparison is. Pancreas: Unremarkable. No pancreatic ductal dilatation or surrounding inflammatory changes. Septate cystic structure at the head of the pancreas measures approximately 3.3 by 2.1 cm in size, not significantly changed from comparison MRI. No new pancreatic lesions. No pancreatic ductal dilatation. Spleen: Normal in size without focal abnormality. Adrenals/Urinary Tract: Normal adrenal glands. Few  subcentimeter hypoattenuating foci in the right kidney are too small to fully characterize. No suspicious renal lesions are seen. No visible urolithiasis or hydronephrosis. Kidneys enhance and excrete symmetrically. There is mild asymmetric bladder wall thickening more pronounced anteriorly, similar to priors. Stomach/Bowel: Distal esophagus, stomach and duodenal sweep are unremarkable. No small bowel wall thickening or dilatation. No evidence of obstruction. Cecum is displaced superiorly with the hepatic flexure interposed anterior to the liver. A normal appendix is visualized. There is predominantly distal colonic edematous mural thickening with mucosal hyperemia which is contiguous from the level of the rectum to the transverse colon. Vascular/Lymphatic: Atherosclerotic plaque within the normal caliber aorta. No proximal vascular occlusions are seen. No  pathologic abdominopelvic adenopathy. Reproductive: The prostate and seminal vesicles are unremarkable aside from mild prostatomegaly. Other: No abdominopelvic free fluid or free gas. No bowel containing hernias. Mild body wall edema. Musculoskeletal: Multilevel degenerative changes are present in the imaged portions of the spine. Mild grade 1 anterolisthesis of L4 on L5. Additional degenerative changes in the hips and SI joints. No acute osseous abnormality or suspicious osseous lesion. Review of the MIP images confirms the above findings. IMPRESSION: 1. No evidence for acute pulmonary artery embolism. 2. Stable 1.2 cm subpleural nodule in the right upper lobe. Given the stability over time, this is most likely benign. 3. Chronic interstitial lung disease. Findings are categorized as probable UIP per consensus guidelines: Diagnosis of Idiopathic Pulmonary Fibrosis: An Official ATS/ERS/JRS/ALAT Clinical Practice Guideline. [place], Iss 5, [phone], Oct 30 2016. 4. Coronary artery calcifications. 5.  Aortic Atherosclerosis (ICD10-I70.0).  6. Circumferential edematous mural thickening and mucosal hyperemia involving the distal colon extending from the rectum to the transverse colon, consistent with an infectious, inflammatory or possibly ischemic colitis though no open proximal vascular occlusions are clearly seen. 7. Stable septate cystic structure at the head of the pancreas, likely side-branch IPMN versus pseudocyst. 8. Asymmetric bladder wall thickening, more pronounced anteriorly, similar to priors. Correlate with urinalysis to exclude cystitis but such features could also be seen with chronic outlet obstruction given mild prostatomegaly. Electronically Signed   By: Lovena Le M.D.   On: 03/31/2019 02:53   CT ABDOMEN PELVIS W CONTRAST  Result Date: 03/31/2019 CLINICAL DATA:  Intermittent shortness of breath, lower abdominal pain and fever with diarrhea for 4 days EXAM: CT ANGIOGRAPHY CHEST CT ABDOMEN AND PELVIS WITH CONTRAST TECHNIQUE: Multidetector CT imaging of the chest was performed using the standard protocol during bolus administration of intravenous contrast. Multiplanar CT image reconstructions and MIPs were obtained to evaluate the vascular anatomy. Multidetector CT imaging of the abdomen and pelvis was performed using the standard protocol during bolus administration of intravenous contrast. CONTRAST:  116m OMNIPAQUE IOHEXOL 350 MG/ML SOLN COMPARISON:  CTA chest 03/02/2019 Jimmy abdomen 12/22/2018, CT abdomen pelvis 08/31/2016-2 FINDINGS: CTA CHEST FINDINGS Cardiovascular: Satisfactory opacification the pulmonary arteries to the segmental level. No pulmonary artery filling defects are identified. There is borderline enlargement of the central pulmonary arteries which is similar to comparison CT. Atherosclerotic calcification of the normal caliber thoracic aorta with some moderate tortuosity. Shared origin of brachiocephalic and left common carotid arteries. Proximal great vessels are unremarkable. Overall normal cardiac size with  some right atrial ventricular dilatation. Coronary artery calcifications are present. No pericardial effusion. Mediastinum/Nodes: Few scattered low-attenuation partially calcified mediastinal and hilar nodes are similar to prior. No suspicious adenopathy. No acute abnormality of the trachea or esophagus. Thyroid gland and thoracic inlet are unremarkable. Lungs/Pleura: There is bilateral subpleural reticulation in a basilar predominance with stacked cyst formation in the lung periphery. More dense opacity in the periphery of the right lung base may reflect some pulmonary microlithiasis which is unchanged from multiple priors. No superimposed areas of acute airspace disease are seen. There is a 1.2 cm subpleural nodule in the right upper lobe (4/43). This is unchanged from comparisons as remote is 2018 no new nodules are seen. No pneumothorax or effusion. Musculoskeletal: Multilevel degenerative changes are present in the imaged portions of the spine. No acute osseous abnormality or suspicious osseous lesion. Vertebral hemangioma at T10 is unchanged from priors. No worrisome chest wall lesions. Review of the MIP images confirms the  above findings. CT ABDOMEN and PELVIS FINDINGS Hepatobiliary: No focal liver abnormality is seen. No gallstones, gallbladder wall thickening, or biliary dilatation. Interposition of the hepatic flexure anterior to the liver. This configuration is similar to comparison is. Pancreas: Unremarkable. No pancreatic ductal dilatation or surrounding inflammatory changes. Septate cystic structure at the head of the pancreas measures approximately 3.3 by 2.1 cm in size, not significantly changed from comparison MRI. No new pancreatic lesions. No pancreatic ductal dilatation. Spleen: Normal in size without focal abnormality. Adrenals/Urinary Tract: Normal adrenal glands. Few subcentimeter hypoattenuating foci in the right kidney are too small to fully characterize. No suspicious renal lesions are  seen. No visible urolithiasis or hydronephrosis. Kidneys enhance and excrete symmetrically. There is mild asymmetric bladder wall thickening more pronounced anteriorly, similar to priors. Stomach/Bowel: Distal esophagus, stomach and duodenal sweep are unremarkable. No small bowel wall thickening or dilatation. No evidence of obstruction. Cecum is displaced superiorly with the hepatic flexure interposed anterior to the liver. A normal appendix is visualized. There is predominantly distal colonic edematous mural thickening with mucosal hyperemia which is contiguous from the level of the rectum to the transverse colon. Vascular/Lymphatic: Atherosclerotic plaque within the normal caliber aorta. No proximal vascular occlusions are seen. No pathologic abdominopelvic adenopathy. Reproductive: The prostate and seminal vesicles are unremarkable aside from mild prostatomegaly. Other: No abdominopelvic free fluid or free gas. No bowel containing hernias. Mild body wall edema. Musculoskeletal: Multilevel degenerative changes are present in the imaged portions of the spine. Mild grade 1 anterolisthesis of L4 on L5. Additional degenerative changes in the hips and SI joints. No acute osseous abnormality or suspicious osseous lesion. Review of the MIP images confirms the above findings. IMPRESSION: 1. No evidence for acute pulmonary artery embolism. 2. Stable 1.2 cm subpleural nodule in the right upper lobe. Given the stability over time, this is most likely benign. 3. Chronic interstitial lung disease. Findings are categorized as probable UIP per consensus guidelines: Diagnosis of Idiopathic Pulmonary Fibrosis: An Official ATS/ERS/JRS/ALAT Clinical Practice Guideline. [place], Iss 5, [phone], Oct 30 2016. 4. Coronary artery calcifications. 5.  Aortic Atherosclerosis (ICD10-I70.0). 6. Circumferential edematous mural thickening and mucosal hyperemia involving the distal colon extending from the rectum to  the transverse colon, consistent with an infectious, inflammatory or possibly ischemic colitis though no open proximal vascular occlusions are clearly seen. 7. Stable septate cystic structure at the head of the pancreas, likely side-branch IPMN versus pseudocyst. 8. Asymmetric bladder wall thickening, more pronounced anteriorly, similar to priors. Correlate with urinalysis to exclude cystitis but such features could also be seen with chronic outlet obstruction given mild prostatomegaly. Electronically Signed   By: Lovena Le M.D.   On: 03/31/2019 02:53   [place] Lower Ext Art Right Ltd  Result Date: 04/24/2019 CLINICAL DATA:  86 year old male, status post thrombectomy for left MCA occlusion 03/04/2019. He then developed a right common femoral artery pseudoaneurysm which was treated with ultrasound-guided thrombin injection 03/07/2019. He returns today for repeat duplex EXAM: RIGHT LOWER EXTREMITY ARTERIAL DUPLEX SCAN TECHNIQUE: Gray-scale sonography as well as color Doppler and duplex ultrasound was performed to evaluate the lower extremity arteries . COMPARISON:  None. FINDINGS: Right lower Extremity Directed duplex of the right lower extremity demonstrates triphasic common femoral artery, SFA, profunda femoris, and tibial arteries. Thrombosed pseudoaneurysm identified at the common femoral artery with no residual flow. IMPRESSION: Waveforms maintained throughout the right lower extremity. Thrombosed right common femoral artery pseudoaneurysm. Signed, Dulcy Fanny. Earleen Newport, DO, RPVI  [facility] Electronically Signed   By: Corrie Mckusick D.O.   On: 04/24/2019 10:34   CUP PACEART REMOTE DEVICE CHECK  Result Date: 04/12/2019 [facility] summary report received. Battery status OK. Normal device function. No new symptom episodes, tachy episodes, brady, or pause episodes. No new AF episodes. Monthly summary reports and ROV/PRN Kathy Breach, RN, CCDS, CV Remote  Solutions   Labs:  CBC: Recent Labs    03/06/19 0301 03/06/19 0301 03/06/19 2338 03/06/19 2338 03/31/19 0020 03/31/19 0603 03/31/19 1123 03/31/19 2324  WBC 12.0*  --  13.0*  --  17.1* 17.1*  --   --   HGB 11.4*   < > 11.8*   < > 13.7 12.6* 12.9* 11.3*  HCT 34.4*   < > 35.7*   < > 42.2 38.8* 38.5* 33.9*  PLT 481*  --  491*  --  477* 414*  --   --    < > = values in this interval not displayed.    COAGS: Recent Labs    03/03/19 2237  INR 1.1  APTT 38*    BMP: Recent Labs    03/04/19 1537 03/04/19 1537 03/06/19 2338 03/22/19 1142 03/31/19 0020 03/31/19 0603  NA 135   < > 130* 132* 137 137  K 3.9   < > 3.9 3.6 3.3* 4.0  CL 102   < > 94* 94* 93* 95*  CO2 26   < > 29 33* 31 32  GLUCOSE 148*   < > 127* 140* 200* 135*  BUN 15   < > 19 13 23 21   CALCIUM 8.4*   < > 8.4* 9.2 9.6 9.0  CREATININE 0.93   < > 1.07 1.01 1.14 1.04  GFRNONAA >60  --  >60  --  56* >60  GFRAA >60  --  >60  --  >60 >60   < > = values in this interval not displayed.    LIVER FUNCTION TESTS: Recent Labs    03/03/19 2237 03/04/19 1537 03/22/19 1142 03/31/19 0020  BILITOT 1.0 0.9 0.7 1.3*  AST 21 21 19 25   ALT 17 18 16 19   ALKPHOS 58 59 70 86  PROT 6.1* 5.3* 6.3 7.0  ALBUMIN 3.5 3.0* 3.9 4.1    TUMOR MARKERS: No results for input(s): AFPTM, CEA, CA199, CHROMGRNA in the last 8760 hours.  Assessment and Plan:  Jimmy Jimenez is 86 year old male with prior left MCA acute ELVO treated with mechanical thrombectomy 3/0/8657, complicated by RCFA pseudoaneurysm.   The duplex today shows complete thrombosis of the pseudoaneurysm.    His pulses are intact in the bilateral lower extremity.    He will be restarting his PT soon, and I assured him he can participate with no problem from our standpoint.   Plan: - I have encouraged him to observe his future follow up appointment with his doctors.  - We are happy to see him back as needed.    Electronically Signed: Corrie Mckusick 04/24/2019,  10:52 AM   I spent a total of    15 Minutes in face to face in clinical consultation, greater than 50% of which was counseling/coordinating care for right CFA pseudoaneurysm, now thrombosed.

## 2019-04-25 ENCOUNTER — Telehealth: Payer: Self-pay

## 2019-04-25 NOTE — Telephone Encounter (Signed)
Linq alert received- can't rule out AF.  Pt with history of cryptogenic stroke in January.  No documented AF, Pt currently on Plavix 75mg  daily, no OAC.  No upcoming appts scheduled.  Sending to Md for review of EGMs to determine if AF and if recommendations.

## 2019-04-26 ENCOUNTER — Other Ambulatory Visit: Payer: Self-pay

## 2019-04-26 ENCOUNTER — Ambulatory Visit (INDEPENDENT_AMBULATORY_CARE_PROVIDER_SITE_OTHER): Payer: PPO

## 2019-04-26 DIAGNOSIS — E538 Deficiency of other specified B group vitamins: Secondary | ICD-10-CM

## 2019-04-26 DIAGNOSIS — R7989 Other specified abnormal findings of blood chemistry: Secondary | ICD-10-CM

## 2019-04-26 MED ORDER — CYANOCOBALAMIN 1000 MCG/ML IJ SOLN
1000.0000 ug | Freq: Once | INTRAMUSCULAR | Status: AC
  Administered 2019-04-26: 1000 ug via INTRAMUSCULAR

## 2019-04-26 NOTE — Progress Notes (Signed)
Patient presented for B 12 injection to left deltoid and Testosterone left glute, patient voiced no concerns nor showed any signs of distress during injection.

## 2019-04-27 NOTE — Telephone Encounter (Signed)
Appears atrial rhythm and not atrial fibrillation. No changes.

## 2019-04-30 ENCOUNTER — Telehealth: Payer: Self-pay | Admitting: Internal Medicine

## 2019-04-30 DIAGNOSIS — I63412 Cerebral infarction due to embolism of left middle cerebral artery: Secondary | ICD-10-CM

## 2019-04-30 DIAGNOSIS — R6889 Other general symptoms and signs: Secondary | ICD-10-CM

## 2019-04-30 NOTE — Telephone Encounter (Signed)
Pt would like a call back. She has some questions about a procedure.

## 2019-05-01 ENCOUNTER — Telehealth: Payer: Self-pay

## 2019-05-01 NOTE — Telephone Encounter (Signed)
Telephone call to patient to schedule palliative care visit with patient. Patient/family in agreement with TELEHEALTH visit on 05-04-19 at 2:00PM.

## 2019-05-02 ENCOUNTER — Telehealth: Payer: Self-pay | Admitting: Internal Medicine

## 2019-05-02 NOTE — Telephone Encounter (Signed)
Pt's wife called and wanted to speak with you. She would not tell me what is was about. Please call back

## 2019-05-02 NOTE — Telephone Encounter (Signed)
Patient called at 6 pm on alternate phone line which is a cell phone loaned to her by son.  [phone].  No mailbox on it yet,  Home line being worked on by [employer]  Advised him that he needs a swallow eval by speech therapy done before an Upper GI because his inability to clear secretions in throat may be due to his stroke   Modified barium Swallow evaluation ordered

## 2019-05-02 NOTE — Telephone Encounter (Signed)
Please call them on [phone] Pt wife called they are wanting him to have a upper GI

## 2019-05-02 NOTE — Telephone Encounter (Signed)
Spoke with pt's wife this morning and she was wanting to see if she could talk with you about the pt's upper GI procedure. She wouldn't tell what about she just stated that she would like to see if she could speak with you. Pt's wife is also concerned because the pt is scheduled for an MRI at [facility] on 05/07/2019. She stated that she knows the pt will have to lay flat on his back but does not think that he will be able to do it because it hurts him to straighten his neck.

## 2019-05-02 NOTE — Telephone Encounter (Signed)
See previous message

## 2019-05-04 ENCOUNTER — Other Ambulatory Visit: Payer: Self-pay

## 2019-05-04 ENCOUNTER — Other Ambulatory Visit: Payer: PPO

## 2019-05-04 DIAGNOSIS — Z515 Encounter for palliative care: Secondary | ICD-10-CM

## 2019-05-04 LAB — GI PATHOGEN PANEL BY PCR, STOOL

## 2019-05-04 NOTE — Telephone Encounter (Signed)
Pt's wife called and would like a call back regarding making her husband an appt with GI? Please advise.

## 2019-05-04 NOTE — Progress Notes (Signed)
COMMUNITY PALLIATIVE CARE SW NOTE  PATIENT NAME: Jimmy Jimenez DOB: 03/13/1925 MRN: [id number]  PRIMARY CARE PROVIDER: Crecencio Mc, MD  RESPONSIBLE PARTY:  Acct ID - Guarantor Home Phone Work Phone Relationship Acct Type  [id number] Jimmy [phone]  Self P/F     [address], [place], [place] [address]     PLAN OF CARE and INTERVENTIONS:             1. GOALS OF CARE/ ADVANCE CARE PLANNING:  Patient does want CPR, does not want intubation or mechanical ventilation. SW recommends MOST form, Jimmy Jimenez wants to review form. Goal is for patient to remain at home and safe. 2. SOCIAL/EMOTIONAL/SPIRITUAL ASSESSMENT/ INTERVENTIONS:  SW completed TELEHEALTH visit with patient and Jimmy Jimenez (patient's wife). Patient is doing "okay". Patient denies pain. No falls reported. Patient feels he has "something in his throat". Jimmy Jimenez has notified PCP. Patient's appetite has also decreased, Jimmy Jimenez assists with all patient's meals. Patient is sleeping well, no concerns. Patient has received one COVID-19 vaccine, goes back for his second one on 3/24. SW discussed goals of care, provided supportive counseling, and used active and reflective listening. 3. PATIENT/CAREGIVER EDUCATION/ COPING:  Patient is alert, oriented to person and place. Patient is forgetful at times. Family is supportive but limited visits due to COVID-19.  4. PERSONAL EMERGENCY PLAN:  Family will call 9-1-1 for emergencies.  5. COMMUNITY RESOURCES COORDINATION/ HEALTH CARE NAVIGATION:  Jimmy Jimenez manages all of patient's care. Patient is waiting referral to home health for swallow evaluation per Jimmy Jimenez. Jimmy Jimenez is going to follow-up with PCP today. Patient is scheduled for testerone injection next week.  6. FINANCIAL/LEGAL CONCERNS/INTERVENTIONS:  None.     SOCIAL HX:  Social History   Tobacco Use  . Smoking status: Never Smoker  . Smokeless tobacco: Never Used  Substance Use Topics  . Alcohol use: No    CODE STATUS:   Code Status:  Prior (FULL CODE) ADVANCED DIRECTIVES: N MOST FORM COMPLETE: Discussion to continue. HOSPICE EDUCATION PROVIDED: None.  PPS: Patient is ambulating with his walker. Patient needs assistance with feeding, bathing and dressing.  Due to the COVID-19 , this visit was done viatelephonefrom my office and it was initiated and consent by this patient and/or family.This was a scheduled visit.  I spent85minutes with patient/family, from2:30-3:00pproviding education, support and consultation.  Jimmy Lombard, LCSW

## 2019-05-07 ENCOUNTER — Telehealth: Payer: Self-pay | Admitting: Internal Medicine

## 2019-05-07 DIAGNOSIS — R531 Weakness: Secondary | ICD-10-CM

## 2019-05-07 DIAGNOSIS — I63412 Cerebral infarction due to embolism of left middle cerebral artery: Secondary | ICD-10-CM

## 2019-05-07 NOTE — Addendum Note (Signed)
Addended by: Crecencio Mc on: 05/07/2019 05:19 PM   Modules accepted: Orders

## 2019-05-07 NOTE — Telephone Encounter (Signed)
Lisa with [employer] would like a walker with a seat attached for pt. Sent to wherever Dr. Derrel Nip would like them to get it from or they can pick it up

## 2019-05-07 NOTE — Telephone Encounter (Signed)
DME for rolling walker printed.  Can send to whatever medical supply store patient wants ot use

## 2019-05-08 NOTE — Telephone Encounter (Signed)
The swallow evaluation needs to be done first

## 2019-05-08 NOTE — Telephone Encounter (Signed)
Spoke with pt's wife to find out where she wanted [place] to send the DME order for pt's rolling walker with seat. She stated that she will pick up on Thursday when she is here for her appt. Forms have been placed in folder on my desk. Also asked her why the pt was needing an appt with a GI doctor. She stated that she thought Dr. Derrel Nip told her that the pt would need to be evaluated by them because she his swallowing could have been effected by his stroke. I reread the message to pt's wife from 6 days ago about the swallow evaluation that was needed by speech therapy and she did not recall that. Pt's wife is unsure of what is needing to be done first.

## 2019-05-08 NOTE — Telephone Encounter (Signed)
Pt's wife is going to pick up the DME order on Thursday when she is here for her appt.

## 2019-05-09 ENCOUNTER — Other Ambulatory Visit: Payer: Self-pay | Admitting: Internal Medicine

## 2019-05-09 NOTE — Telephone Encounter (Signed)
Attempted to call pt's wife. No answer, no voicemail.

## 2019-05-09 NOTE — Addendum Note (Signed)
Addended by: Adair Laundry on: 05/09/2019 04:04 PM   Modules accepted: Orders

## 2019-05-10 ENCOUNTER — Other Ambulatory Visit: Payer: Self-pay

## 2019-05-10 ENCOUNTER — Ambulatory Visit (INDEPENDENT_AMBULATORY_CARE_PROVIDER_SITE_OTHER): Payer: PPO

## 2019-05-10 VITALS — BP 148/78 | HR 87 | Resp 16

## 2019-05-10 DIAGNOSIS — R7989 Other specified abnormal findings of blood chemistry: Secondary | ICD-10-CM | POA: Diagnosis not present

## 2019-05-10 MED ORDER — TESTOSTERONE CYPIONATE 200 MG/ML IM SOLN
200.0000 mg | INTRAMUSCULAR | Status: DC
  Administered 2019-05-10: 11:00:00 200 mg via INTRAMUSCULAR

## 2019-05-10 NOTE — Progress Notes (Addendum)
I was ask BY CMA Janett Billow) to evaluate and help with Patient I took vitals as follows BP 148/78 in left arm , pulse 87 and 02 sats on room air ranged from 98 to 94%, Patient is sitting in hunched position and slouched at shoulders, patient stated he cannot raise head to look forward any longer he did not know where he was due to he could only see the floor. Patient was alert and oriented times 3, able to recall nurses name without prompting. Eyes PERRLA, grasp weak but equal on both sides, leg strength weak but equal with flexion and extension. Patient wife ( caregiver) reported he cannot stand for only brief seconds she is afraid he will fall, patient wife ask if home health could give injections in the home. Patient has another pressure ulcer to right side of buttock on the inner gluteal, gave several non-stick Telfa dressing to care giver and some thick padding to use under each gluteal to alternate pressure left sided pressure ulcer from previous visit has healed. Advised caregiver patient need to alternate side at least every hour while awake. Advised  Would forward to PCP for advice on home health giving injection B 12 and testosterone at home.   I have reviewed the above information and agree with above.   Deborra Medina, MD

## 2019-05-10 NOTE — Progress Notes (Signed)
Patient came in today for 0.25MG  injection of testosterone given in left upper quad. Patient tolerated injection well with no signs of distress.   Upon patient's arrival patient ws very weak & felt unstable on his feet. I helped patient with wife along with patient's wife & patient used walker to get inside office. Patient was so weak by the time he got in the door he asked to sit down. I gave patient a wheel chair & wheeling him into a room. Patient had the made the statement "Where am I? Coming into the building. When I told this to Janett Billow, she felt it was best to get Juliann Pulse to evaluate patient. She checked patient's BP & HR as well as O2. She felt that patient was not getting enough oxygen into his lungs dur to his posture. Patient was able to rest for 5 plus minutes & Fransisco Beau was able to help me lift patient to stand so injection could be given. Patient was also safely assisted by Fransisco Beau back to wheelchair & outside to patient's car successfully.

## 2019-05-12 ENCOUNTER — Encounter: Payer: Self-pay | Admitting: Emergency Medicine

## 2019-05-12 ENCOUNTER — Emergency Department: Payer: PPO

## 2019-05-12 ENCOUNTER — Emergency Department
Admission: EM | Admit: 2019-05-12 | Discharge: 2019-05-12 | Disposition: A | Discharge status: Home / Self Care | Payer: PPO | Attending: Emergency Medicine | Admitting: Emergency Medicine

## 2019-05-12 ENCOUNTER — Other Ambulatory Visit: Payer: Self-pay

## 2019-05-12 DIAGNOSIS — I5032 Chronic diastolic (congestive) heart failure: Secondary | ICD-10-CM | POA: Diagnosis not present

## 2019-05-12 DIAGNOSIS — R1310 Dysphagia, unspecified: Secondary | ICD-10-CM | POA: Diagnosis present

## 2019-05-12 DIAGNOSIS — I10 Essential (primary) hypertension: Secondary | ICD-10-CM | POA: Insufficient documentation

## 2019-05-12 DIAGNOSIS — I639 Cerebral infarction, unspecified: Secondary | ICD-10-CM | POA: Diagnosis not present

## 2019-05-12 DIAGNOSIS — Z79899 Other long term (current) drug therapy: Secondary | ICD-10-CM | POA: Insufficient documentation

## 2019-05-12 LAB — COMPREHENSIVE METABOLIC PANEL
ALT: 14 U/L (ref 0–44)
AST: 20 U/L (ref 15–41)
Albumin: 4 g/dL (ref 3.5–5.0)
Alkaline Phosphatase: 66 U/L (ref 38–126)
Anion gap: 11 (ref 5–15)
BUN: 17 mg/dL (ref 8–23)
CO2: 40 mmol/L — ABNORMAL HIGH (ref 22–32)
Calcium: 9.3 mg/dL (ref 8.9–10.3)
Chloride: 87 mmol/L — ABNORMAL LOW (ref 98–111)
Creatinine, Ser: 0.96 mg/dL (ref 0.61–1.24)
GFR calc Af Amer: >60 mL/min (ref >60)
GFR calc non Af Amer: >60 mL/min (ref >60)
Glucose, Bld: 111 mg/dL — ABNORMAL HIGH (ref 70–99)
Potassium: 2.8 mmol/L — ABNORMAL LOW (ref 3.5–5.1)
Sodium: 138 mmol/L (ref 135–145)
Total Bilirubin: 1.2 mg/dL (ref 0.3–1.2)
Total Protein: 6.7 g/dL (ref 6.5–8.1)

## 2019-05-12 LAB — CBC
HCT: 43.6 % (ref 39.0–52.0)
Hemoglobin: 14.3 g/dL (ref 13.0–17.0)
MCH: 32.4 pg (ref 26.0–34.0)
MCHC: 32.8 g/dL (ref 30.0–36.0)
MCV: 98.6 fL (ref 80.0–100.0)
Platelets: 434 10*3/uL — ABNORMAL HIGH (ref 150–400)
RBC: 4.42 MIL/uL (ref 4.22–5.81)
RDW: 13.3 % (ref 11.5–15.5)
WBC: 9 10*3/uL (ref 4.0–10.5)
nRBC: 0 % (ref 0.0–0.2)

## 2019-05-12 MED ORDER — SODIUM CHLORIDE 0.9 % IV BOLUS
500.0000 mL | Freq: Once | INTRAVENOUS | Status: AC
  Administered 2019-05-12: 500 mL via INTRAVENOUS

## 2019-05-12 NOTE — ED Triage Notes (Signed)
Pt to triage with difficulty swallowing for several weeks.  Pt has hx of stroke.  Pt has appointment to be evaluated for same, but this AM was unable to swallow medications.  Pt spitting into cup in triage.

## 2019-05-12 NOTE — ED Notes (Signed)
Dr Kinner at bedside. 

## 2019-05-12 NOTE — ED Notes (Signed)
Report given to Jessica, RN.

## 2019-05-12 NOTE — ED Provider Notes (Signed)
[facility] Emergency Department Provider Note   ____________________________________________    I have reviewed the triage vital signs and the nursing notes.   HISTORY  Chief Complaint Dysphagia     HPI Jimmy Jimenez is a 86 y.o. male with a history of CVA, CHF, hypertension who presents with complaints of difficulty swallowing.  Patient reports about 3 weeks ago he began having difficulty swallowing, notably the patient did have a stroke in early January however reports his swallowing only became difficult 3 weeks ago.  He is scheduled for an upper GI via his PCP later this week.  This morning he had difficulty swallowing his calcium tablet.  His wife reports he was able to drink coffee and have some oatmeal however.  Does report some intermittent right sided headaches, none currently.  No new neuro deficits.  Past Medical History:  Diagnosis Date  . Colon polyps   . Essential thrombocytosis ([place])   . Essential thrombocytosis ([place])   . Hyperlipidemia   . Hypertension   . Lung nodule 12/2004   found on CXR  . Osteoporosis     Patient Active Problem List   Diagnosis Date Noted  . Hypersalivation 04/13/2019  . IPMN (intraductal papillary mucinous neoplasm) 04/01/2019  . Acute colitis 03/31/2019  . Cerebrovascular accident (CVA) ([place])   . Hematochezia   . Hospital discharge follow-up 03/11/2019  . Pseudoaneurysm ([place]) R groin 03/08/2019  . Leukocytosis 03/08/2019  . Acute blood loss anemia 03/08/2019  . Mild malnutrition ([place]) 03/08/2019  . Pressure injury of skin 03/06/2019  . 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 GERD (gastroesophageal reflux disease) 03/02/2019  . Chronic diastolic CHF (congestive heart failure) ([place]) 03/02/2019  . Elevated troponin 03/02/2019  . Sacral decubitus ulcer, stage II ([place]) 02/19/2019  . Chest pain 09/30/2018  . Rhinitis, chronic  11/20/2017  . Mass of pancreas 04/23/2016  . B12 deficiency 08/17/2015  . Low serum testosterone 05/20/2015  . Chronic constipation 03/31/2015  . Pulmonary fibrosis ([place]) 12/12/2014  . Abnormal weight loss 08/20/2014  . Medicare annual wellness visit, subsequent 09/28/2013  . Statin intolerance 09/26/2013  . Pulmonary hypertension, mild ([place]) 12/14/2012  . Solitary pulmonary nodule 10/16/2012  . Heart failure, diastolic, due to HTN ([place]) 10/16/2012  . Bradycardia, sinus 09/02/2012  . Hyponatremia 12/01/2011  . Edema of both legs 10/30/2011  . Essential thrombocytosis ([place])   . Hyperlipidemia   . Essential hypertension   . Osteoporosis     Past Surgical History:  Procedure Laterality Date  . BONE MARROW BIOPSY  01/18/07  . COLONOSCOPY  2007, 2010   Dr Allen Norris  . IR CT HEAD LTD  03/04/2019  . IR PERCUTANEOUS ART THROMBECTOMY/INFUSION INTRACRANIAL INC DIAG ANGIO  03/04/2019  . IR RADIOLOGIST EVAL & MGMT  03/20/2019  . IR RADIOLOGIST EVAL & MGMT  04/24/2019  . IR US GUIDE BX ASP/DRAIN  03/07/2019  . IR US GUIDE VASC ACCESS RIGHT  03/04/2019  . LOOP RECORDER INSERTION N/A 03/07/2019   Procedure: LOOP RECORDER INSERTION;  Surgeon: Constance Haw, MD;  Location: [facility];  Service: Cardiovascular;  Laterality: N/A;  . NASAL SINUS SURGERY  06/11/05  . RADIOLOGY WITH ANESTHESIA N/A 03/03/2019   Procedure: IR WITH ANESTHESIA;  Surgeon: Luanne Bras, MD;  Location: [facility];  Service: Radiology;  Laterality: N/A;    Prior to Admission medications   Medication Sig Start Date End Date Taking? Authorizing Provider  Ascorbic Acid (VITAMIN C) 1000 MG tablet Take 1,000 mg by mouth daily.    [provider]  atorvastatin (LIPITOR) 10 MG tablet Take 1 tablet (10 mg total) by mouth daily at 6 PM. 03/08/19   Donzetta Starch, NP  azelastine (ASTELIN) 0.1 % nasal spray Place 2 sprays into both nostrils 2 (two) times daily.  02/10/19   [provider]  benzonatate (TESSALON) 200  MG capsule Take 1 capsule (200 mg total) by mouth 2 (two) times daily as needed for cough. 01/04/19   Crecencio Mc, MD  Cholecalciferol (VITAMIN D3) 25 MCG (1000 UT) CAPS Take 1,000 Units by mouth daily. Take 2 by mouth daily     [provider]  clopidogrel (PLAVIX) 75 MG tablet Take 1 tablet (75 mg total) by mouth daily. 03/09/19   Donzetta Starch, NP  clotrimazole-betamethasone (LOTRISONE) cream Apply topically 2 (two) times daily. 11/28/18   Crecencio Mc, MD  cyanocobalamin 1000 MCG tablet Take 1,000 mcg by mouth daily.     [provider]  docusate sodium (COLACE) 100 MG capsule Take 100 mg by mouth 2 (two) times daily as needed for mild constipation.    [provider]  esomeprazole (NEXIUM) 40 MG capsule TAKE 1 CAPSULE BY MOUTH ONCE DAILY AT NOON 11/27/18   Crecencio Mc, MD  feeding supplement, ENSURE ENLIVE, (ENSURE ENLIVE) LIQD Take 237 mLs by mouth 2 (two) times daily between meals. 03/08/19   Donzetta Starch, NP  furosemide (LASIX) 20 MG tablet Take 1 tablet (20 mg total) by mouth daily. 03/22/19   Crecencio Mc, MD  hydroxyurea (HYDREA) 500 MG capsule TAKE 1 CAPSULE BY MOUTH EVERY OTHER DAY MAY TAKE WITH FOOD TO MINIMIZE GI SIDE EFFECTS. 08/08/18   Cammie Sickle, MD  Infant Care Products ([place]) CREA Apply 1 application topically as needed (after each bowel movement for barrier for  pressure ulcer.). 02/15/19   Crecencio Mc, MD  lisinopril (ZESTRIL) 40 MG tablet Take 1 tablet (40 mg total) by mouth at bedtime. 03/22/19   Crecencio Mc, MD  testosterone cypionate (DEPOTESTOSTERONE CYPIONATE) 200 MG/ML injection INJECT 0.25 ML INTRAMUSCULARLY EVERY 14 DAYS AS DIRECTED 02/16/19   Crecencio Mc, MD     Allergies Amlodipine, Atenolol, Erythromycin, Furosemide, Hctz [hydrochlorothiazide], Hydrochlorothiazide w-triamterene, Levofloxacin, Metoprolol, Micardis [telmisartan], Penicillins, Terazosin, and Triamterene-hctz  Family History  Problem  Relation Age of Onset  . Colon cancer Mother        colon age 74's  . Peripheral vascular disease Father   . Colon cancer Brother        colon age 35's  . Diabetes Other   . Prostate cancer Neg Hx   . Bladder Cancer Neg Hx   . Kidney cancer Neg Hx     Social History Social History   Tobacco Use  . Smoking status: Never Smoker  . Smokeless tobacco: Never Used  Substance Use Topics  . Alcohol use: No  . Drug use: No    Review of Systems  Constitutional: No fever/chills Eyes: No visual changes.  ENT: As above, no sore throat Cardiovascular: Denies chest pain. Respiratory: Denies shortness of breath. Gastrointestinal: No abdominal pain.   Genitourinary: Negative for dysuria. Musculoskeletal: Negative for back pain. Skin: Negative for rash. Neurological: As above, no new weakness   ____________________________________________   PHYSICAL EXAM:  VITAL SIGNS: ED Triage Vitals [05/12/19 0835]  Enc Vitals Group     BP (!) 166/72  Pulse Rate 80     Resp 18     Temp 98.2 F (36.8 C)     Temp Source Oral     SpO2 96 %     Weight      Height      Head Circumference      Peak Flow      Pain Score      Pain Loc      Pain Edu?      Excl. in [place]?     Constitutional: Alert and oriented.   Nose: No congestion/rhinnorhea. Mouth/Throat: Mucous membranes are moist.  Pharynx appears normal Neck: Patient maintains flexed appearance of neck, which is chronic since stroke apparently Cardiovascular: Normal rate, regular rhythm.  Good peripheral circulation. Respiratory: Normal respiratory effort.  No retractions. Lungs CTAB. Gastrointestinal: Soft and nontender. No distention.   Musculoskeletal:  Warm and well perfused Neurologic:  Normal speech and language.  Skin:  Skin is warm, dry and intact. No rash noted. Psychiatric: Mood and affect are normal. Speech and behavior are normal.  ____________________________________________   LABS (all labs ordered are listed,  but only abnormal results are displayed)  Labs Reviewed  CBC - Abnormal; Notable for the following components:      Result Value   Platelets 434 (*)    All other components within normal limits  COMPREHENSIVE METABOLIC PANEL - Abnormal; Notable for the following components:   Potassium 2.8 (*)    Chloride 87 (*)    CO2 40 (*)    Glucose, Bld 111 (*)    All other components within normal limits   ____________________________________________  EKG  None ____________________________________________  RADIOLOGY  CT head unremarkable ____________________________________________   PROCEDURES  Procedure(s) performed: No  Procedures   Critical Care performed: No ____________________________________________   INITIAL IMPRESSION / ASSESSMENT AND PLAN / ED COURSE  Pertinent labs & imaging results that were available during my care of the patient were reviewed by me and considered in my medical decision making (see chart for details).  Patient presents with difficulty swallowing although does sound like he was able to get oatmeal and coffee down this morning.  Has outpatient work-up scheduled, will check labs, CT head to evaluate for possible new CVA, give IV fluids and reevaluate.  Patient's lab work is overall not significantly changed from prior, mild hypokalemia.  CT head is unremarkable.  Swallow study performed by nurse, patient tolerated liquids without difficulty.  Discussed with patient's wife that crushing tablets and opening up capsules to give medication with water or with oatmeal is perfectly reasonable.  Patient and wife are content with this plan given that they have upper GI scheduled    ____________________________________________   FINAL CLINICAL IMPRESSION(S) / ED DIAGNOSES  Final diagnoses:  Dysphagia, unspecified type        Note:  This document was prepared using Dragon voice recognition software and may include unintentional dictation errors.    Lavonia Drafts, MD 05/12/19 1252

## 2019-05-12 NOTE — ED Notes (Signed)
Pt' wife had walked outside to get the car and pt states he cannot sign for discharge. Pt and wife given discharge paperwork and instructions and they both voice understanding

## 2019-05-13 ENCOUNTER — Inpatient Hospital Stay
Admission: EM | Admit: 2019-05-13 | Discharge: 2019-05-18 | DRG: 291 | Disposition: A | Discharge status: Hospice | Payer: No Typology Code available for payment source | Attending: Hospitalist | Admitting: Hospitalist

## 2019-05-13 ENCOUNTER — Emergency Department: Payer: No Typology Code available for payment source

## 2019-05-13 ENCOUNTER — Other Ambulatory Visit: Payer: Self-pay

## 2019-05-13 DIAGNOSIS — Z880 Allergy status to penicillin: Secondary | ICD-10-CM

## 2019-05-13 DIAGNOSIS — I10 Essential (primary) hypertension: Secondary | ICD-10-CM | POA: Diagnosis not present

## 2019-05-13 DIAGNOSIS — I5032 Chronic diastolic (congestive) heart failure: Secondary | ICD-10-CM

## 2019-05-13 DIAGNOSIS — X58XXXA Exposure to other specified factors, initial encounter: Secondary | ICD-10-CM | POA: Diagnosis present

## 2019-05-13 DIAGNOSIS — R0989 Other specified symptoms and signs involving the circulatory and respiratory systems: Secondary | ICD-10-CM

## 2019-05-13 DIAGNOSIS — K117 Disturbances of salivary secretion: Secondary | ICD-10-CM | POA: Diagnosis present

## 2019-05-13 DIAGNOSIS — Z833 Family history of diabetes mellitus: Secondary | ICD-10-CM

## 2019-05-13 DIAGNOSIS — Q394 Esophageal web: Secondary | ICD-10-CM | POA: Diagnosis not present

## 2019-05-13 DIAGNOSIS — Z8673 Personal history of transient ischemic attack (TIA), and cerebral infarction without residual deficits: Secondary | ICD-10-CM | POA: Diagnosis not present

## 2019-05-13 DIAGNOSIS — I509 Heart failure, unspecified: Secondary | ICD-10-CM

## 2019-05-13 DIAGNOSIS — Z66 Do not resuscitate: Secondary | ICD-10-CM | POA: Diagnosis present

## 2019-05-13 DIAGNOSIS — K8689 Other specified diseases of pancreas: Secondary | ICD-10-CM | POA: Diagnosis present

## 2019-05-13 DIAGNOSIS — I700 Atherosclerosis of aorta: Secondary | ICD-10-CM | POA: Diagnosis present

## 2019-05-13 DIAGNOSIS — I110 Hypertensive heart disease with heart failure: Principal | ICD-10-CM | POA: Diagnosis present

## 2019-05-13 DIAGNOSIS — Z7902 Long term (current) use of antithrombotics/antiplatelets: Secondary | ICD-10-CM

## 2019-05-13 DIAGNOSIS — K862 Cyst of pancreas: Secondary | ICD-10-CM | POA: Diagnosis present

## 2019-05-13 DIAGNOSIS — E876 Hypokalemia: Secondary | ICD-10-CM

## 2019-05-13 DIAGNOSIS — I69351 Hemiplegia and hemiparesis following cerebral infarction affecting right dominant side: Secondary | ICD-10-CM

## 2019-05-13 DIAGNOSIS — J8410 Pulmonary fibrosis, unspecified: Secondary | ICD-10-CM | POA: Diagnosis present

## 2019-05-13 DIAGNOSIS — M810 Age-related osteoporosis without current pathological fracture: Secondary | ICD-10-CM | POA: Diagnosis present

## 2019-05-13 DIAGNOSIS — I5033 Acute on chronic diastolic (congestive) heart failure: Secondary | ICD-10-CM | POA: Diagnosis not present

## 2019-05-13 DIAGNOSIS — E785 Hyperlipidemia, unspecified: Secondary | ICD-10-CM | POA: Diagnosis present

## 2019-05-13 DIAGNOSIS — Z515 Encounter for palliative care: Secondary | ICD-10-CM | POA: Diagnosis present

## 2019-05-13 DIAGNOSIS — E43 Unspecified severe protein-calorie malnutrition: Secondary | ICD-10-CM | POA: Diagnosis present

## 2019-05-13 DIAGNOSIS — L8992 Pressure ulcer of unspecified site, stage 2: Secondary | ICD-10-CM | POA: Diagnosis present

## 2019-05-13 DIAGNOSIS — Z681 Body mass index (BMI) 19 or less, adult: Secondary | ICD-10-CM | POA: Diagnosis not present

## 2019-05-13 DIAGNOSIS — J9601 Acute respiratory failure with hypoxia: Secondary | ICD-10-CM | POA: Diagnosis present

## 2019-05-13 DIAGNOSIS — Z993 Dependence on wheelchair: Secondary | ICD-10-CM

## 2019-05-13 DIAGNOSIS — I5031 Acute diastolic (congestive) heart failure: Secondary | ICD-10-CM | POA: Diagnosis not present

## 2019-05-13 DIAGNOSIS — Z8719 Personal history of other diseases of the digestive system: Secondary | ICD-10-CM | POA: Diagnosis not present

## 2019-05-13 DIAGNOSIS — Z950 Presence of cardiac pacemaker: Secondary | ICD-10-CM | POA: Diagnosis not present

## 2019-05-13 DIAGNOSIS — I248 Other forms of acute ischemic heart disease: Secondary | ICD-10-CM | POA: Diagnosis present

## 2019-05-13 DIAGNOSIS — Z7189 Other specified counseling: Secondary | ICD-10-CM | POA: Diagnosis not present

## 2019-05-13 DIAGNOSIS — R0602 Shortness of breath: Secondary | ICD-10-CM

## 2019-05-13 DIAGNOSIS — R1310 Dysphagia, unspecified: Secondary | ICD-10-CM | POA: Diagnosis present

## 2019-05-13 DIAGNOSIS — Z800 Family history of malignant neoplasm of digestive organs: Secondary | ICD-10-CM

## 2019-05-13 DIAGNOSIS — Z20822 Contact with and (suspected) exposure to covid-19: Secondary | ICD-10-CM | POA: Diagnosis present

## 2019-05-13 DIAGNOSIS — Z79899 Other long term (current) drug therapy: Secondary | ICD-10-CM

## 2019-05-13 DIAGNOSIS — D473 Essential (hemorrhagic) thrombocythemia: Secondary | ICD-10-CM | POA: Diagnosis present

## 2019-05-13 DIAGNOSIS — Z8249 Family history of ischemic heart disease and other diseases of the circulatory system: Secondary | ICD-10-CM

## 2019-05-13 DIAGNOSIS — S51811A Laceration without foreign body of right forearm, initial encounter: Secondary | ICD-10-CM | POA: Diagnosis present

## 2019-05-13 DIAGNOSIS — K30 Functional dyspepsia: Secondary | ICD-10-CM

## 2019-05-13 LAB — CBC WITH DIFFERENTIAL/PLATELET
Abs Immature Granulocytes: 0.04 10*3/uL (ref 0.00–0.07)
Basophils Absolute: 0.1 10*3/uL (ref 0.0–0.1)
Basophils Relative: 1 %
Eosinophils Absolute: 0.1 10*3/uL (ref 0.0–0.5)
Eosinophils Relative: 1 %
HCT: 42.1 % (ref 39.0–52.0)
Hemoglobin: 13.9 g/dL (ref 13.0–17.0)
Immature Granulocytes: 0 %
Lymphocytes Relative: 15 %
Lymphs Abs: 1.5 10*3/uL (ref 0.7–4.0)
MCH: 32 pg (ref 26.0–34.0)
MCHC: 33 g/dL (ref 30.0–36.0)
MCV: 96.8 fL (ref 80.0–100.0)
Monocytes Absolute: 1.1 10*3/uL — ABNORMAL HIGH (ref 0.1–1.0)
Monocytes Relative: 10 %
Neutro Abs: 7.6 10*3/uL (ref 1.7–7.7)
Neutrophils Relative %: 73 %
Platelets: 416 10*3/uL — ABNORMAL HIGH (ref 150–400)
RBC: 4.35 MIL/uL (ref 4.22–5.81)
RDW: 13.1 % (ref 11.5–15.5)
WBC: 10.3 10*3/uL (ref 4.0–10.5)
nRBC: 0 % (ref 0.0–0.2)

## 2019-05-13 LAB — COMPREHENSIVE METABOLIC PANEL
ALT: 12 U/L (ref 0–44)
AST: 24 U/L (ref 15–41)
Albumin: 4 g/dL (ref 3.5–5.0)
Alkaline Phosphatase: 71 U/L (ref 38–126)
Anion gap: 12 (ref 5–15)
BUN: 13 mg/dL (ref 8–23)
CO2: 37 mmol/L — ABNORMAL HIGH (ref 22–32)
Calcium: 9.5 mg/dL (ref 8.9–10.3)
Chloride: 86 mmol/L — ABNORMAL LOW (ref 98–111)
Creatinine, Ser: 0.81 mg/dL (ref 0.61–1.24)
GFR calc Af Amer: >60 mL/min (ref >60)
GFR calc non Af Amer: >60 mL/min (ref >60)
Glucose, Bld: 82 mg/dL (ref 70–99)
Potassium: 2.9 mmol/L — ABNORMAL LOW (ref 3.5–5.1)
Sodium: 135 mmol/L (ref 135–145)
Total Bilirubin: 1.7 mg/dL — ABNORMAL HIGH (ref 0.3–1.2)
Total Protein: 6.6 g/dL (ref 6.5–8.1)

## 2019-05-13 LAB — BRAIN NATRIURETIC PEPTIDE: B Natriuretic Peptide: 103 pg/mL — ABNORMAL HIGH (ref 0.0–100.0)

## 2019-05-13 LAB — TROPONIN I (HIGH SENSITIVITY)
Troponin I (High Sensitivity): 113 ng/L (ref <18)
Troponin I (High Sensitivity): 100 ng/L (ref <18)

## 2019-05-13 MED ORDER — HYDROXYUREA 500 MG PO CAPS
500.0000 mg | ORAL_CAPSULE | ORAL | Status: DC
  Filled 2019-05-13 (×2): qty 1

## 2019-05-13 MED ORDER — POTASSIUM CHLORIDE 10 MEQ/100ML IV SOLN
10.0000 meq | INTRAVENOUS | Status: DC
  Administered 2019-05-14 (×2): 10 meq via INTRAVENOUS
  Filled 2019-05-13 (×6): qty 100

## 2019-05-13 MED ORDER — VITAMIN B-12 1000 MCG PO TABS
1000.0000 ug | ORAL_TABLET | Freq: Every day | ORAL | Status: DC
  Filled 2019-05-13 (×2): qty 1

## 2019-05-13 MED ORDER — CLOPIDOGREL BISULFATE 75 MG PO TABS
75.0000 mg | ORAL_TABLET | Freq: Every day | ORAL | Status: DC
  Filled 2019-05-13 (×2): qty 1

## 2019-05-13 MED ORDER — ENOXAPARIN SODIUM 40 MG/0.4ML ~~LOC~~ SOLN
40.0000 mg | SUBCUTANEOUS | Status: DC
  Administered 2019-05-14 – 2019-05-16 (×4): 40 mg via SUBCUTANEOUS
  Filled 2019-05-13 (×4): qty 0.4

## 2019-05-13 MED ORDER — ATORVASTATIN CALCIUM 10 MG PO TABS: 10.0000 mg | ORAL_TABLET | Freq: Every day | ORAL | Status: DC

## 2019-05-13 MED ORDER — FUROSEMIDE 10 MG/ML IJ SOLN
40.0000 mg | Freq: Every day | INTRAMUSCULAR | Status: DC
  Administered 2019-05-14 – 2019-05-15 (×2): 40 mg via INTRAVENOUS
  Filled 2019-05-13 (×2): qty 4

## 2019-05-13 MED ORDER — ASPIRIN 81 MG PO CHEW
324.0000 mg | CHEWABLE_TABLET | Freq: Once | ORAL | Status: DC
  Filled 2019-05-13: qty 4

## 2019-05-13 MED ORDER — LISINOPRIL 20 MG PO TABS
40.0000 mg | ORAL_TABLET | Freq: Every day | ORAL | Status: DC
  Administered 2019-05-14: 40 mg via ORAL
  Filled 2019-05-13: qty 2

## 2019-05-13 MED ORDER — PANTOPRAZOLE SODIUM 40 MG PO TBEC: 40.0000 mg | DELAYED_RELEASE_TABLET | Freq: Every day | ORAL | Status: DC

## 2019-05-13 MED ORDER — FUROSEMIDE 10 MG/ML IJ SOLN
40.0000 mg | Freq: Once | INTRAMUSCULAR | Status: AC
  Administered 2019-05-13: 40 mg via INTRAVENOUS
  Filled 2019-05-13: qty 4

## 2019-05-13 NOTE — ED Triage Notes (Signed)
Pt states he is shob and "choking" when he breathes. Pt with 4+ edema noted to bilateral lower extremities from mid tibia to feet. Pt with noted tachypnea, shallow resps.

## 2019-05-13 NOTE — H&P (Addendum)
History and Physical    Jimmy Jimenez XKG:[id number] DOB: 1925/03/15 DOA: 05/13/2019  PCP: Crecencio Mc, MD  Patient coming from: Home, lives with wife  I have personally briefly reviewed patient's old medical records in [place]  Chief Complaint: Difficulty swallowing  HPI: Jimmy Jimenez is a 86 y.o. 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  For the past 3 weeks patient has been complaining of a choking sensation even when he is not eating.  He also has had issues with hypersalivation for years.  Patient became more concerned as he has been unable to eat food or keep down his medications.  He discussed this issue with his PCP and had plan for a upper GI series this coming Thursday but wife could not wait any longer since his symptoms appeared to be getting worse.  He denies any symptoms of chest pain or shortness of breath.  No nausea or vomiting although he does spit up every time he tries to take any p.o.  Denies any abdominal pain. Wife thinks he has been having progressive shortness of breath but could have been ongoing for months. States it improves when his legs are propped up. Patient is able to use a walker for short distance but mostly wheelchair-bound for long distance.  Of note, he was just evaluated yesterday in the ED for the same symptoms and had a negative CT head and was discharged home with outpatient GI follow-up.  ED Course: He was afebrile, normotensive and was hypoxic and tachypneic requiring 2 L via nasal cannula.  CBC shows no leukocytosis or anemia.  Platelet of 416.  Sodium of 135, potassium of 2.9, normal creatinine of 0.81, elevated total  bilirubin of 1.7.  BNP of 103.  Troponin of 113.  Chest x-ray shows cardiomegaly with increasing interstitial edema and small effusion compatible with congestive heart failure.  He was given 40 mg IV Lasix, 40mq of IV potassium but was unable to tolerate p.o. aspirin and intermittently had dysphagia and was spitting up after taking the pill.  Review of Systems:  Constitutional: No Weight Change, No Fever ENT/Mouth: No sore throat, No Rhinorrhea Eyes: No Eye Pain, No Vision Changes Cardiovascular: No Chest Pain, no SOB, +Edema Respiratory: No Cough, No Sputum  Gastrointestinal: No Nausea, No Vomiting, No Diarrhea, No Constipation, No Pain Genitourinary: no Urinary Incontinence Musculoskeletal: No Arthralgias, No Myalgias Skin: No Skin Lesions, No Pruritus, Neuro: no Weakness, No Numbness Psych: No Anxiety/Panic, No Depression, + decrease appetite Heme/Lymph: No Bruising, No Bleeding  Past Medical History:  Diagnosis Date  . Colon polyps   . Essential thrombocytosis ([facility]   . Essential thrombocytosis ([facility]   . Hyperlipidemia   . Hypertension   . Lung nodule 12/2004   found on CXR  . Osteoporosis     Past Surgical History:  Procedure Laterality Date  . BONE MARROW BIOPSY  01/18/07  . COLONOSCOPY  2007, 2010   Dr WAllen Norris . IR CT HEAD LTD  03/04/2019  . IR PERCUTANEOUS ART THROMBECTOMY/INFUSION INTRACRANIAL INC DIAG ANGIO  03/04/2019  . IR RADIOLOGIST EVAL & MGMT  03/20/2019  . IR RADIOLOGIST EVAL & MGMT  04/24/2019  . IR [place] BX ASP/DRAIN  03/07/2019  .  IR US GUIDE VASC ACCESS RIGHT  03/04/2019  . LOOP RECORDER INSERTION N/A 03/07/2019   Procedure: LOOP RECORDER INSERTION;  Surgeon: Constance Haw, MD;  Location: [facility];  Service: Cardiovascular;  Laterality: N/A;  . NASAL SINUS SURGERY  06/11/05  . RADIOLOGY WITH ANESTHESIA N/A 03/03/2019   Procedure: IR WITH ANESTHESIA;  Surgeon: Luanne Bras, MD;  Location: [facility];  Service: Radiology;  Laterality: N/A;      reports that he has never smoked. He has never used smokeless tobacco. He reports that he does not drink alcohol or use drugs.  Allergies  Allergen Reactions  . Amlodipine     swelling  . Atenolol     swelling  . Erythromycin     Other reaction(s): Hallucination  . Furosemide     Dizziness   . Hctz [Hydrochlorothiazide]     hyponatremia  . Hydrochlorothiazide W-Triamterene     Other reaction(s): Other (See Comments) Patient stated is causes low sodium Patient stated is causes low sodium Patient stated is causes low sodium  Patient stated is causes low sodium   Patient stated is causes low sodium Patient stated is causes low sodium  . Levofloxacin     Causes sleepiness  . Metoprolol     dizziness  . Micardis [Telmisartan]     Causes swelling  . Penicillins Itching    Has patient had a PCN reaction causing immediate rash, facial/tongue/throat swelling, SOB or lightheadedness with hypotension: No Has patient had a PCN reaction causing severe rash involving mucus membranes or skin necrosis: No Has patient had a PCN reaction that required hospitalization No Has patient had a PCN reaction occurring within the last 10 years: No If all of the above answers are "NO", then may proceed with Cephalosporin use.  . Terazosin     Causes swelling in legs and feet  . Triamterene-Hctz     Patient stated is causes low sodium    Family History  Problem Relation Age of Onset  . Colon cancer Mother        colon age 65's  . Peripheral vascular disease Father   . Colon cancer Brother        colon age 37's  . Diabetes Other   . Prostate cancer Neg Hx   . Bladder Cancer Neg Hx   . Kidney cancer Neg Hx      Prior to Admission medications   Medication Sig Start Date End Date Taking? Authorizing Provider  Ascorbic Acid (VITAMIN C) 1000 MG tablet Take 1,000 mg by mouth daily.   Yes [provider]  atorvastatin (LIPITOR) 10 MG tablet Take 1 tablet (10 mg total) by mouth  daily at 6 PM. 03/08/19  Yes Biby, Massie Kluver, NP  azelastine (ASTELIN) 0.1 % nasal spray Place 2 sprays into both nostrils 2 (two) times daily.  02/10/19  Yes [provider]  benzonatate (TESSALON) 200 MG capsule Take 1 capsule (200 mg total) by mouth 2 (two) times daily as needed for cough. 01/04/19  Yes Crecencio Mc, MD  Cholecalciferol (VITAMIN D3) 25 MCG (1000 UT) CAPS Take 1,000 Units by mouth daily. Take 2 by mouth daily    Yes [provider]  clopidogrel (PLAVIX) 75 MG tablet Take 1 tablet (75 mg total) by mouth daily. 03/09/19  Yes Donzetta Starch, NP  clotrimazole-betamethasone (LOTRISONE) cream Apply topically 2 (two) times daily. 11/28/18  Yes Crecencio Mc, MD  cyanocobalamin 1000 MCG tablet Take 1,000 mcg by  mouth daily.    Yes [provider]  docusate sodium (COLACE) 100 MG capsule Take 100 mg by mouth 2 (two) times daily as needed for mild constipation.   Yes [provider]  esomeprazole (NEXIUM) 40 MG capsule TAKE 1 CAPSULE BY MOUTH ONCE DAILY AT NOON 11/27/18  Yes Crecencio Mc, MD  feeding supplement, ENSURE ENLIVE, (ENSURE ENLIVE) LIQD Take 237 mLs by mouth 2 (two) times daily between meals. 03/08/19  Yes Donzetta Starch, NP  furosemide (LASIX) 20 MG tablet Take 1 tablet (20 mg total) by mouth daily. 03/22/19  Yes Crecencio Mc, MD  hydroxyurea (HYDREA) 500 MG capsule TAKE 1 CAPSULE BY MOUTH EVERY OTHER DAY MAY TAKE WITH FOOD TO MINIMIZE GI SIDE EFFECTS. 08/08/18  Yes Cammie Sickle, MD  [place] CREA Apply 1 application topically as needed (after each bowel movement for barrier for  pressure ulcer.). 02/15/19  Yes Crecencio Mc, MD  lisinopril (ZESTRIL) 40 MG tablet Take 1 tablet (40 mg total) by mouth at bedtime. 03/22/19  Yes Crecencio Mc, MD  testosterone cypionate (DEPOTESTOSTERONE CYPIONATE) 200 MG/ML injection INJECT 0.25 ML INTRAMUSCULARLY EVERY 14 DAYS AS DIRECTED 02/16/19  Yes Crecencio Mc, MD     Physical Exam: Vitals:   05/13/19 2100 05/13/19 2130 05/13/19 2145 05/13/19 2230  BP: (!) 163/67 (!) 133/51  (!) 112/58  Pulse: 66 65 65 76  Resp: (!) 35  (!) 36 (!) 22  Temp:      TempSrc:      SpO2: 100% 100% 100% 100%  Weight:      Height:        Constitutional: elderly thin chronically ill appearing male sitting up in bed with head flexed to chest. Pt started to spit up immediately after trying to take aspirin PO.  Vitals:   05/13/19 2100 05/13/19 2130 05/13/19 2145 05/13/19 2230  BP: (!) 163/67 (!) 133/51  (!) 112/58  Pulse: 66 65 65 76  Resp: (!) 35  (!) 36 (!) 22  Temp:      TempSrc:      SpO2: 100% 100% 100% 100%  Weight:      Height:       Eyes: PERRL, lids and conjunctivae normal ENMT: Mucous membranes are moist. Missing dentition.  Neck: normal, supple Respiratory: clear to auscultation bilaterally, no wheezing, no crackles. Normal respiratory effort on 2L. No accessory muscle use.  Cardiovascular: Regular rate and rhythm, no murmurs / rubs / gallops. No extremity edema. Unable to access pedal pulses due to significant edema. Significant +3 pitting edema of bilateral LE up to pre-tibial region.  Abdomen: no tenderness, no masses palpated.  Bowel sounds positive.  Musculoskeletal: no clubbing / cyanosis. No joint deformity upper and lower extremities. Contractures of the upper extremities. Normal muscle tone.  Skin: bruises on left UE Neurologic: CN 2-12 grossly intact. Sensation intact. Strength 3/5 in all 4.  Psychiatric: Normal judgment and insight. Alert and oriented x 3. Normal mood.     Labs on Admission: I have personally reviewed following labs and imaging studies  CBC: Recent Labs  Lab 05/12/19 0951 05/13/19 2058  WBC 9.0 10.3  NEUTROABS  --  7.6  HGB 14.3 13.9  HCT 43.6 42.1  MCV 98.6 96.8  PLT 434* 262*   Basic Metabolic Panel: Recent Labs  Lab 05/12/19 0951 05/13/19 2058  NA 138 135  K 2.8* 2.9*  CL 87* 86*  CO2 40* 37*  GLUCOSE  111*  82  BUN 17 13  CREATININE 0.96 0.81  CALCIUM 9.3 9.5   GFR: Estimated Creatinine Clearance: 57.1 mL/min (by C-G formula based on SCr of 0.81 mg/dL). Liver Function Tests: Recent Labs  Lab 05/12/19 0951 05/13/19 2058  AST 20 24  ALT 14 12  ALKPHOS 66 71  BILITOT 1.2 1.7*  PROT 6.7 6.6  ALBUMIN 4.0 4.0   No results for input(s): LIPASE, AMYLASE in the last 168 hours. No results for input(s): AMMONIA in the last 168 hours. Coagulation Profile: No results for input(s): INR, PROTIME in the last 168 hours. Cardiac Enzymes: No results for input(s): CKTOTAL, CKMB, CKMBINDEX, TROPONINI in the last 168 hours. BNP (last 3 results) No results for input(s): PROBNP in the last 8760 hours. HbA1C: No results for input(s): HGBA1C in the last 72 hours. CBG: No results for input(s): GLUCAP in the last 168 hours. Lipid Profile: No results for input(s): CHOL, HDL, LDLCALC, TRIG, CHOLHDL, LDLDIRECT in the last 72 hours. Thyroid Function Tests: No results for input(s): TSH, T4TOTAL, FREET4, T3FREE, THYROIDAB in the last 72 hours. Anemia Panel: No results for input(s): VITAMINB12, FOLATE, FERRITIN, TIBC, IRON, RETICCTPCT in the last 72 hours. Urine analysis:    Component Value Date/Time   COLORURINE YELLOW (A) 03/31/2019 0111   APPEARANCEUR CLEAR (A) 03/31/2019 0111   APPEARANCEUR Clear 03/10/2017 1324   LABSPEC >1.046 (H) 03/31/2019 0111   PHURINE 5.0 03/31/2019 0111   GLUCOSEU NEGATIVE 03/31/2019 0111   GLUCOSEU NEGATIVE 08/29/2014 1050   HGBUR NEGATIVE 03/31/2019 0111   BILIRUBINUR NEGATIVE 03/31/2019 0111   BILIRUBINUR Negative 03/10/2017 1324   KETONESUR NEGATIVE 03/31/2019 0111   PROTEINUR NEGATIVE 03/31/2019 0111   UROBILINOGEN 0.2 08/29/2014 1050   UROBILINOGEN 0.2 08/29/2014 1048   NITRITE NEGATIVE 03/31/2019 0111   LEUKOCYTESUR NEGATIVE 03/31/2019 0111    Radiological Exams on Admission: DG Chest 1 View  Result Date: 05/13/2019 CLINICAL DATA:  Shortness of breath.  4+ pitting edema. EXAM: CHEST  1 VIEW COMPARISON:  Two-view chest x-ray 03/01/2019 FINDINGS: The heart is enlarged. Atherosclerotic changes are noted at the aortic arch. Patchy interstitial pattern has increased slightly. Small effusions are suspected. Loop recorder is in place. IMPRESSION: 1. Cardiomegaly with increasing interstitial edema and small effusions compatible with congestive heart failure. 2. Aortic atherosclerosis. Electronically Signed   By: San Morelle M.D.   On: 05/13/2019 21:03   CT Head Wo Contrast  Result Date: 05/12/2019 CLINICAL DATA:  86 year old male with altered mental status and difficulty swallowing. EXAM: CT HEAD WITHOUT CONTRAST TECHNIQUE: Contiguous axial images were obtained from the base of the skull through the vertex without intravenous contrast. COMPARISON:  03/04/2019 MR and prior studies FINDINGS: Brain: No evidence of acute infarction, hemorrhage, hydrocephalus, extra-axial collection or mass lesion/mass effect. Atrophy and chronic small-vessel white matter ischemic changes are again noted. Vascular: Carotid and vertebral atherosclerotic calcifications are noted. Skull: Normal. Negative for fracture or focal lesion. Sinuses/Orbits: No acute finding. Other: None. IMPRESSION: 1. No evidence of acute intracranial abnormality. 2. Atrophy and chronic small-vessel white matter ischemic changes. Electronically Signed   By: Margarette Canada M.D.   On: 05/12/2019 11:42    EKG: Independently reviewed.   Assessment/PlaN  Acute hypoxic respiratory failure in the setting of acute on chronic diastolic heart failure and history of pulmonary fibrosis Suspect acute CHF likely due to inability to tolerate p.o. and not being able to take his medication. Follow strict I's and O's Daily weights IV 40 mg Lasix daily  Elevated troponin Likely  demand ischemia. Will continue to trend and monitor for any chest pain symptoms.  Dysphagia Patient originally had outpatient upper GI  series plan although unsure if patient could tolerate this procedure.  Will need GI consult in the morning Speech eval Keep n.p.o.  Hypokalemia Received IV 10 mEq potassium in the ED Follow with repeat BMP  History of CVA with right hemiparesis Continue Plavix when able to take PO  Essential thrombocytosis Stable Continue hydroxyurea when able  Hypertension Continue lisinopril when able PO  Pancreatic cyst/intraductal papillary neoplasm Patient initially had planned EUS/FNA with [facility] but this has been postponed due to recent CVA in January Patient currently follows with outpatient palliative care for goal setting  DVT prophylaxis:.Lovenox Code Status: Full Family Communication: Plan discussed with patient and wife at bedside  disposition Plan: Home with at least 2 midnight stays  Consults called:  Admission status: inpatient with at least 2 midnight stay with close telemetry monitoring and treatment of acute on chronic diastolic heart failure with acute hypoxic respiratory failure   Orene Desanctis DO [facility]   If 7PM-7AM, please contact night-coverage [email or web address]   05/13/2019, 11:01 PM

## 2019-05-13 NOTE — ED Provider Notes (Signed)
[facility] Emergency Department Provider Note  ____________________________________________   First MD Initiated Contact with Patient 05/13/19 2032     (approximate)  I have reviewed the triage vital signs and the nursing notes.  History  Chief Complaint Shortness of Breath    HPI Jimmy Jimenez is a 86 y.o. male who presents to the emergency department for shortness of breath, dysphagia, "choking" with breathing as well as any kind of eating or drinking.  Patient states symptoms have been progressively worsening for the last few weeks, but have acutely gotten worse over the last several days.  He does not feel like he has been able to take any adequate PO over the last 2 to 3 days because of his symptoms.  No alleviating or aggravating components.  He feels short of breath.  He has a history of CVA, but does not have any history of related dysphagia because of this.  He also has marked bilateral lower extremity pitting edema.  Denies any pain.   Past Medical Hx Past Medical History:  Diagnosis Date  . Colon polyps   . Essential thrombocytosis ([place])   . Essential thrombocytosis ([place])   . Hyperlipidemia   . Hypertension   . Lung nodule 12/2004   found on CXR  . Osteoporosis     Problem List Patient Active Problem List   Diagnosis Date Noted  . Hypersalivation 04/13/2019  . IPMN (intraductal papillary mucinous neoplasm) 04/01/2019  . Acute colitis 03/31/2019  . Cerebrovascular accident (CVA) ([place])   . Hematochezia   . Hospital discharge follow-up 03/11/2019  . Pseudoaneurysm ([place]) R groin 03/08/2019  . Leukocytosis 03/08/2019  . Acute blood loss anemia 03/08/2019  . Mild malnutrition ([place]) 03/08/2019  . Pressure injury of skin 03/06/2019  . 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 GERD (gastroesophageal reflux disease) 03/02/2019  . Chronic diastolic CHF (congestive heart  failure) ([place]) 03/02/2019  . Elevated troponin 03/02/2019  . Sacral decubitus ulcer, stage II ([place]) 02/19/2019  . Chest pain 09/30/2018  . Rhinitis, chronic 11/20/2017  . Mass of pancreas 04/23/2016  . B12 deficiency 08/17/2015  . Low serum testosterone 05/20/2015  . Chronic constipation 03/31/2015  . Pulmonary fibrosis ([place]) 12/12/2014  . Abnormal weight loss 08/20/2014  . Medicare annual wellness visit, subsequent 09/28/2013  . Statin intolerance 09/26/2013  . Pulmonary hypertension, mild ([place]) 12/14/2012  . Solitary pulmonary nodule 10/16/2012  . Heart failure, diastolic, due to HTN ([place]) 10/16/2012  . Bradycardia, sinus 09/02/2012  . Hyponatremia 12/01/2011  . Edema of both legs 10/30/2011  . Essential thrombocytosis ([place])   . Hyperlipidemia   . Essential hypertension   . Osteoporosis     Past Surgical Hx Past Surgical History:  Procedure Laterality Date  . BONE MARROW BIOPSY  01/18/07  . COLONOSCOPY  2007, 2010   Dr Allen Norris  . IR CT HEAD LTD  03/04/2019  . IR PERCUTANEOUS ART THROMBECTOMY/INFUSION INTRACRANIAL INC DIAG ANGIO  03/04/2019  . IR RADIOLOGIST EVAL & MGMT  03/20/2019  . IR RADIOLOGIST EVAL & MGMT  04/24/2019  . IR US GUIDE BX ASP/DRAIN  03/07/2019  . IR US GUIDE VASC ACCESS RIGHT  03/04/2019  . LOOP RECORDER INSERTION N/A 03/07/2019   Procedure: LOOP RECORDER INSERTION;  Surgeon: Constance Haw, MD;  Location: [place] [facility];  Service: Cardiovascular;  Laterality: N/A;  . NASAL SINUS SURGERY  06/11/05  . RADIOLOGY WITH ANESTHESIA N/A 03/03/2019  Procedure: IR WITH ANESTHESIA;  Surgeon: Luanne Bras, MD;  Location: [place];  Service: Radiology;  Laterality: N/A;    Medications Prior to Admission medications   Medication Sig Start Date End Date Taking? Authorizing Provider  Ascorbic Acid (VITAMIN C) 1000 MG tablet Take 1,000 mg by mouth daily.    [provider]  atorvastatin (LIPITOR) 10 MG tablet Take 1 tablet (10 mg total) by mouth daily at 6 PM.  03/08/19   Donzetta Starch, NP  azelastine (ASTELIN) 0.1 % nasal spray Place 2 sprays into both nostrils 2 (two) times daily.  02/10/19   [provider]  benzonatate (TESSALON) 200 MG capsule Take 1 capsule (200 mg total) by mouth 2 (two) times daily as needed for cough. 01/04/19   Crecencio Mc, MD  Cholecalciferol (VITAMIN D3) 25 MCG (1000 UT) CAPS Take 1,000 Units by mouth daily. Take 2 by mouth daily     [provider]  clopidogrel (PLAVIX) 75 MG tablet Take 1 tablet (75 mg total) by mouth daily. 03/09/19   Donzetta Starch, NP  clotrimazole-betamethasone (LOTRISONE) cream Apply topically 2 (two) times daily. 11/28/18   Crecencio Mc, MD  cyanocobalamin 1000 MCG tablet Take 1,000 mcg by mouth daily.     [provider]  docusate sodium (COLACE) 100 MG capsule Take 100 mg by mouth 2 (two) times daily as needed for mild constipation.    [provider]  esomeprazole (NEXIUM) 40 MG capsule TAKE 1 CAPSULE BY MOUTH ONCE DAILY AT NOON 11/27/18   Crecencio Mc, MD  feeding supplement, ENSURE ENLIVE, (ENSURE ENLIVE) LIQD Take 237 mLs by mouth 2 (two) times daily between meals. 03/08/19   Donzetta Starch, NP  furosemide (LASIX) 20 MG tablet Take 1 tablet (20 mg total) by mouth daily. 03/22/19   Crecencio Mc, MD  hydroxyurea (HYDREA) 500 MG capsule TAKE 1 CAPSULE BY MOUTH EVERY OTHER DAY MAY TAKE WITH FOOD TO MINIMIZE GI SIDE EFFECTS. 08/08/18   Cammie Sickle, MD  Infant Care Products ([place]) CREA Apply 1 application topically as needed (after each bowel movement for barrier for  pressure ulcer.). 02/15/19   Crecencio Mc, MD  lisinopril (ZESTRIL) 40 MG tablet Take 1 tablet (40 mg total) by mouth at bedtime. 03/22/19   Crecencio Mc, MD  testosterone cypionate (DEPOTESTOSTERONE CYPIONATE) 200 MG/ML injection INJECT 0.25 ML INTRAMUSCULARLY EVERY 14 DAYS AS DIRECTED 02/16/19   Crecencio Mc, MD    Allergies Amlodipine, Atenolol, Erythromycin, Furosemide,  Hctz [hydrochlorothiazide], Hydrochlorothiazide w-triamterene, Levofloxacin, Metoprolol, Micardis [telmisartan], Penicillins, Terazosin, and Triamterene-hctz  Family Hx Family History  Problem Relation Age of Onset  . Colon cancer Mother        colon age 94's  . Peripheral vascular disease Father   . Colon cancer Brother        colon age 44's  . Diabetes Other   . Prostate cancer Neg Hx   . Bladder Cancer Neg Hx   . Kidney cancer Neg Hx     Social Hx Social History   Tobacco Use  . Smoking status: Never Smoker  . Smokeless tobacco: Never Used  Substance Use Topics  . Alcohol use: No  . Drug use: No     Review of Systems  Constitutional: Negative for fever, chills. Eyes: Negative for visual changes. ENT: Negative for sore throat. Cardiovascular: Negative for chest pain. Respiratory: Positive for shortness of breath. Gastrointestinal: Negative for nausea, vomiting.  Positive for "choking" Genitourinary:  Negative for dysuria. Musculoskeletal: Positive for leg swelling. Skin: Negative for rash. Neurological: Negative for headaches.   Physical Exam  Vital Signs: ED Triage Vitals  Enc Vitals Group     BP 05/13/19 2016 (!) 151/82     Pulse Rate 05/13/19 2059 67     Resp 05/13/19 2016 (!) 28     Temp 05/13/19 2016 98.3 F (36.8 C)     Temp Source 05/13/19 2016 Oral     SpO2 05/13/19 2016 91 %     Weight 05/13/19 2016 150 lb (68 kg)     Height 05/13/19 2016 5' 8"  (1.727 m)     Head Circumference --      Peak Flow --      Pain Score 05/13/19 2016 0     Pain Loc --      Pain Edu? --      Excl. in [place]? --     Constitutional: Alert and oriented. Well appearing.  Head: Normocephalic. Atraumatic. Eyes: Conjunctivae clear, sclera anicteric. Pupils equal and symmetric. Nose: No masses or lesions. No congestion or rhinorrhea. Mouth/Throat: Wearing mask.  Neck: No stridor. Trachea midline.  Cardiovascular: Normal rate, regular rhythm. Extremities well  perfused. Respiratory: Tachypneic.  On 2 L nasal cannula for initial oxygen 91% on RA. Gastrointestinal: Soft. Non-distended. Non-tender.  Genitourinary: Deferred. Musculoskeletal: Marked, 4+ pitting edema to the bilateral lower extremities. Neurologic:  Normal speech and language. No gross focal or lateralizing neurologic deficits are appreciated.  Skin: Skin is warm, dry and intact. No rash noted. Psychiatric: Mood and affect are appropriate for situation.  EKG  Personally reviewed and interpreted by myself.   Rate: 95 Rhythm: sinus Axis: LAD Intervals: WNL No STEMI    Radiology  CXR IMPRESSION:  1. Cardiomegaly with increasing interstitial edema and small  effusions compatible with congestive heart failure.  2. Aortic atherosclerosis.    Procedures  Procedure(s) performed (including critical care):  .Critical Care Performed by: Lilia Pro., MD Authorized by: Lilia Pro., MD   Critical care provider statement:    Critical care time (minutes):  35   Critical care was necessary to treat or prevent imminent or life-threatening deterioration of the following conditions: HF exacerbation needing IV diuresis and supplemental oxygen.   Critical care was time spent personally by me on the following activities:  Discussions with consultants, evaluation of patient's response to treatment, examination of patient, ordering and performing treatments and interventions, ordering and review of laboratory studies, ordering and review of radiographic studies, pulse oximetry, re-evaluation of patient's condition, obtaining history from patient or surrogate and review of old charts     Initial Impression / Assessment and Plan / MDM / ED Course  86 y.o. male who presents to the ED for shortness of breath, dysphagia, "choking" when he breathes or tries to eat or drink anything.  Ddx: dysphagia, HF exacerbation, pneumonia. Consider new intracranial etiology such as CVA, however  patient had a CT head yesterday without acute findings  X-ray reveals interstitial edema and effusions, consistent with CHF.  Will give dose of IV diuretics.  Will also replete potassium given need for IV diuresis.  High-sensitivity troponin elevated 113, likely in the setting of demand, will trend, ASA ordered.  We will plan to admit for heart failure exacerbation.  _______________________________  As part of my medical decision making I have reviewed available labs, radiology tests, reviewed old records.  Final Clinical Impression(s) / ED Diagnosis  Final diagnoses:  Shortness of breath  Choking sensation  Acute on chronic congestive heart failure, unspecified heart failure type [facility])       Note:  This document was prepared using Dragon voice recognition software and may include unintentional dictation errors.   Lilia Pro., MD 05/13/19 2156

## 2019-05-13 NOTE — ED Notes (Addendum)
Admitting at bedside   Pt spitting, reports "I can't get it up" wife reports that choking feeling for the last 3 weeks, NPO at  Wife reports a recent admission to hosp for stroke

## 2019-05-13 NOTE — ED Notes (Signed)
Pt to 12h, report to [place], rn.

## 2019-05-13 NOTE — ED Notes (Signed)
Date and time results received: 05/13/19 2144 (use smartphrase ".now" to insert current time)  Test: Troponin Critical Value: 113  Name of Provider Notified: Dr. Joan Mayans  Orders Received? Or Actions Taken?: NA

## 2019-05-14 ENCOUNTER — Ambulatory Visit (INDEPENDENT_AMBULATORY_CARE_PROVIDER_SITE_OTHER): Payer: No Typology Code available for payment source | Admitting: *Deleted

## 2019-05-14 ENCOUNTER — Encounter: Payer: Self-pay | Admitting: Family Medicine

## 2019-05-14 ENCOUNTER — Telehealth: Payer: Self-pay | Admitting: Internal Medicine

## 2019-05-14 DIAGNOSIS — Z950 Presence of cardiac pacemaker: Secondary | ICD-10-CM

## 2019-05-14 DIAGNOSIS — I5031 Acute diastolic (congestive) heart failure: Secondary | ICD-10-CM

## 2019-05-14 DIAGNOSIS — D473 Essential (hemorrhagic) thrombocythemia: Secondary | ICD-10-CM

## 2019-05-14 DIAGNOSIS — I509 Heart failure, unspecified: Secondary | ICD-10-CM

## 2019-05-14 DIAGNOSIS — R1310 Dysphagia, unspecified: Secondary | ICD-10-CM

## 2019-05-14 DIAGNOSIS — E876 Hypokalemia: Secondary | ICD-10-CM

## 2019-05-14 DIAGNOSIS — R0989 Other specified symptoms and signs involving the circulatory and respiratory systems: Secondary | ICD-10-CM

## 2019-05-14 DIAGNOSIS — I10 Essential (primary) hypertension: Secondary | ICD-10-CM

## 2019-05-14 DIAGNOSIS — Z8673 Personal history of transient ischemic attack (TIA), and cerebral infarction without residual deficits: Secondary | ICD-10-CM

## 2019-05-14 LAB — BASIC METABOLIC PANEL
Anion gap: 18 — ABNORMAL HIGH (ref 5–15)
BUN: 14 mg/dL (ref 8–23)
CO2: 35 mmol/L — ABNORMAL HIGH (ref 22–32)
Calcium: 9.3 mg/dL (ref 8.9–10.3)
Chloride: 85 mmol/L — ABNORMAL LOW (ref 98–111)
Creatinine, Ser: 0.9 mg/dL (ref 0.61–1.24)
GFR calc Af Amer: >60 mL/min (ref >60)
GFR calc non Af Amer: >60 mL/min (ref >60)
Glucose, Bld: 58 mg/dL — ABNORMAL LOW (ref 70–99)
Potassium: 2.8 mmol/L — ABNORMAL LOW (ref 3.5–5.1)
Sodium: 138 mmol/L (ref 135–145)

## 2019-05-14 LAB — CUP PACEART REMOTE DEVICE CHECK
Date Time Interrogation Session: 20210313185405
Implantable Pulse Generator Implant Date: 20210106

## 2019-05-14 LAB — CBC
HCT: 42.5 % (ref 39.0–52.0)
Hemoglobin: 13.9 g/dL (ref 13.0–17.0)
MCH: 31.9 pg (ref 26.0–34.0)
MCHC: 32.7 g/dL (ref 30.0–36.0)
MCV: 97.5 fL (ref 80.0–100.0)
Platelets: 436 10*3/uL — ABNORMAL HIGH (ref 150–400)
RBC: 4.36 MIL/uL (ref 4.22–5.81)
RDW: 13.2 % (ref 11.5–15.5)
WBC: 10.3 10*3/uL (ref 4.0–10.5)
nRBC: 0 % (ref 0.0–0.2)

## 2019-05-14 LAB — GLUCOSE, CAPILLARY
Glucose-Capillary: 50 mg/dL — ABNORMAL LOW (ref 70–99)
Glucose-Capillary: 138 mg/dL — ABNORMAL HIGH (ref 70–99)

## 2019-05-14 LAB — TROPONIN I (HIGH SENSITIVITY): Troponin I (High Sensitivity): 100 ng/L (ref <18)

## 2019-05-14 LAB — SARS CORONAVIRUS 2 (TAT 6-24 HRS): SARS Coronavirus 2: NEGATIVE

## 2019-05-14 LAB — MAGNESIUM: Magnesium: 1.9 mg/dL (ref 1.7–2.4)

## 2019-05-14 MED ORDER — PANTOPRAZOLE SODIUM 40 MG IV SOLR
40.0000 mg | Freq: Two times a day (BID) | INTRAVENOUS | Status: DC
  Administered 2019-05-14 – 2019-05-18 (×7): 40 mg via INTRAVENOUS
  Filled 2019-05-14 (×8): qty 40

## 2019-05-14 MED ORDER — DEXTROSE 50 % IV SOLN
INTRAVENOUS | Status: AC
  Administered 2019-05-14: 50 mL via INTRAVENOUS
  Filled 2019-05-14: qty 50

## 2019-05-14 MED ORDER — POTASSIUM CHLORIDE 10 MEQ/100ML IV SOLN
10.0000 meq | INTRAVENOUS | Status: AC
  Administered 2019-05-14 (×2): 10 meq via INTRAVENOUS
  Filled 2019-05-14 (×3): qty 100

## 2019-05-14 MED ORDER — POTASSIUM CHLORIDE 10 MEQ/100ML IV SOLN
10.0000 meq | INTRAVENOUS | Status: DC
  Administered 2019-05-14 (×4): 10 meq via INTRAVENOUS
  Filled 2019-05-14 (×6): qty 100

## 2019-05-14 MED ORDER — DEXTROSE 50 % IV SOLN: 1.0000 | Freq: Once | INTRAVENOUS | Status: AC

## 2019-05-14 NOTE — Progress Notes (Signed)
ILR Remote 

## 2019-05-14 NOTE — ED Notes (Signed)
Admitting MD at bedside, repeat CBG 138.

## 2019-05-14 NOTE — ED Notes (Signed)
Admitting MD made aware of patient's CBG from morning labs, VORB for 1amp D50 due to patient's NPO status, repeat CBG checked, repeat CBG 50, 1amp D50 administered per MD order. Pt tolerated well.

## 2019-05-14 NOTE — Progress Notes (Signed)
Pt admitted from ED. Pt denies any pain and or discomfort. Oriented to room and call bell in reach. Skin assesment complete, cleansed foam dressing applied to buttocks, stage 2 noted to right and left buttocks. Skin tear noted to right arm, cleansed and non adherent dressing applied, orders for wound consult.

## 2019-05-14 NOTE — Progress Notes (Signed)
PROGRESS NOTE    Jimmy Jimenez  [id number] DOB: 03-19-1925 DOA: 05/13/2019 PCP: Crecencio Mc, MD   Brief Narrative:  Jimmy Jimenez is a 86 y.o. 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  He also has had issues with hypersalivation for years.  Patient became more concerned as he has been unable to eat food or keep down his medications.  She was seen in a day prior to admission in ED for similar symptoms, negative CT head and was discharged home to follow-up with his GI. He was hypoxic on presentation requiring 2 L, potassium of 2.9 with lower extremity edema and chest x-ray with interstitial edema.  Admitted for CHF exacerbation.  Subjective: He was complaining of difficulty swallowing, stating that food just comes back in his throat.  Denies any shortness of breath.  Assessment & Plan:   Principal Problem:   Acute CHF (congestive heart failure) (HCC) Active Problems:   Essential thrombocytosis (HCC)   Essential hypertension   Mass of pancreas   History of CVA (cerebrovascular accident)   Hypokalemia   Dysphagia  Acute hypoxic respiratory failure in the setting of acute on chronic diastolic heart failure and history of pulmonary fibrosis.  Patient was unable to take his medications for the past few days due to difficulty swallowing. Saturation improves with diuresis. -Continue IV Lasix 40 mg daily. -Continue daily weight and strict intake and output.  Dysphagia  Swallow evaluation was done, low risk for aspiration but they are recommending GI consult. -GI was consulted-appreciate their recommendations. -GI is  recommending modified barium swallow with speech pathology, which was ordered.  Pancreatic cyst/intraductal papillary neoplasm. Patient apparently has an history of pancreatic head cystic mass which is growing in size and CA 19-9 was elevating.  Being investigated by [facility] and oncology.  He was planned for EUS got delayed due to his CVA in January.  According to their note patient was interested in surgery if diagnosed with cancer. EUS is normally not done here and he needs to keep his appointment for Thursday.  Elevated troponin.  Most likely secondary to demand, started trending down.  Hypokalemia.  Potassium persistently low at 2.8.  Apparently unable to get all his potassium overnight as order expired?? Magnesium at 1.9 -Give him IV KCl 10 MCG x6. -Monitor electrolytes.  History of CVA with right hemiparesis Continue Plavix  Essential thrombocytosis Stable Continue hydroxyurea.  Hypertension Continue lisinopril  Objective: Vitals:   05/14/19 0830 05/14/19 0900 05/14/19 0941 05/14/19 1206  BP: 136/75 138/62 (!) 121/55 (!) 158/71  Pulse: 88 84 73 74  Resp: (!) 38 19 16 16   Temp:   97.6 F (36.4 C) 97.6 F (36.4 C)  TempSrc:   Oral Oral  SpO2: 100% 100% 100% 100%  Weight:   59.7 kg   Height:   5\' 8"  (1.727 m)     Intake/Output Summary (Last 24 hours) at 05/14/2019 1503 Last data filed at 05/14/2019 1214 Gross per 24 hour  Intake 203.9 ml  Output 1900 ml  Net -1696.1 ml   Filed Weights   05/13/19 2016 05/14/19 0941  Weight: 68 kg 59.7 kg    Examination:  General exam: Conically  ill-appearing, emaciated elderly man, appears calm and comfortable  Respiratory system: Clear to auscultation. Respiratory effort normal. Cardiovascular system: S1 & S2 heard, RRR. No JVD, murmurs, rubs, gallops or clicks. Gastrointestinal system: Soft, nontender, nondistended, bowel sounds positive. Central nervous system: Alert and oriented.  Extremities: 2+ LE edema, no cyanosis,  pulses intact and symmetrical. Psychiatry: Judgement and insight appear normal.     DVT prophylaxis: Lovenox Code Status: Partial/DNI Family Communication: No family at bedside. Disposition Plan: Pending improvement and work-up.  Consultants:   GI  Procedures:  Antimicrobials:   Data Reviewed: I have personally reviewed following labs and imaging studies  CBC: Recent Labs  Lab 05/12/19 0951 05/13/19 2058 05/14/19 0714  WBC 9.0 10.3 10.3  NEUTROABS  --  7.6  --   HGB 14.3 13.9 13.9  HCT 43.6 42.1 42.5  MCV 98.6 96.8 97.5  PLT 434* 416* [id number]   Basic Metabolic Panel: Recent Labs  Lab 05/12/19 0951 05/13/19 2058 05/14/19 0714  NA 138 135 138  K 2.8* 2.9* 2.8*  CL 87* 86* 85*  CO2 40* 37* 35*  GLUCOSE 111* 82 58*  BUN 17 13 14   CREATININE 0.96 0.81 0.90  CALCIUM 9.3 9.5 9.3  MG  --   --  1.9   GFR: Estimated Creatinine Clearance: 45.1 mL/min (by C-G formula based on SCr of 0.9 mg/dL). Liver Function Tests: Recent Labs  Lab 05/12/19 0951 05/13/19 2058  AST 20 24  ALT 14 12  ALKPHOS 66 71  BILITOT 1.2 1.7*  PROT 6.7 6.6  ALBUMIN 4.0 4.0   No results for input(s): LIPASE, AMYLASE in the last 168 hours. No results for input(s): AMMONIA in the last 168 hours. Coagulation Profile: No results for input(s): INR, PROTIME in the last 168 hours. Cardiac Enzymes: No results for input(s): CKTOTAL, CKMB, CKMBINDEX, TROPONINI in the last 168 hours. BNP (last 3 results) No results for input(s): PROBNP in the last 8760 hours. HbA1C: No results for input(s): HGBA1C in the last 72 hours. CBG: Recent Labs  Lab 05/14/19 0829 05/14/19 0858  GLUCAP 50* 138*   Lipid Profile: No results for input(s): CHOL, HDL, LDLCALC, TRIG, CHOLHDL, LDLDIRECT in the last 72 hours. Thyroid Function Tests: No results for input(s): TSH, T4TOTAL, FREET4, T3FREE, THYROIDAB in the last 72 hours. Anemia Panel: No results for input(s): VITAMINB12, FOLATE, FERRITIN, TIBC, IRON,  RETICCTPCT in the last 72 hours. Sepsis Labs: No results for input(s): PROCALCITON, LATICACIDVEN in the last 168 hours.  Recent Results (from the past 240 hour(s))  SARS CORONAVIRUS 2 (TAT 6-24 HRS) Nasopharyngeal Nasopharyngeal Swab     Status: None   Collection Time: 05/13/19 10:17 PM   Specimen: Nasopharyngeal Swab  Result Value Ref Range Status   SARS Coronavirus 2 NEGATIVE NEGATIVE Final    Comment: (NOTE) SARS-CoV-2 target nucleic acids are NOT DETECTED. The SARS-CoV-2 RNA is generally detectable in upper and lower respiratory specimens during the acute phase of infection. Negative results do not preclude SARS-CoV-2 infection, do not rule out co-infections with other pathogens, and should not be used as the sole basis for treatment or other patient management decisions. Negative results must be combined with clinical observations, patient history, and epidemiological information. The expected result is Negative. Fact Sheet for Patients: SugarRoll.be Fact Sheet for Healthcare Providers: https://www.woods-mathews.com/ This test is not yet approved or cleared by the [place] FDA and  has been authorized for detection and/or diagnosis of SARS-CoV-2 by FDA under an Emergency Use Authorization (EUA). This EUA will  remain  in effect (meaning this test can be used) for the duration of the COVID-19 declaration under Section 56 4(b)(1) of the Act, 21 U.S.C. section 360bbb-3(b)(1), unless the authorization is terminated or revoked sooner. Performed at [facility], [place] [address], [place], [place] [address]      Radiology Studies: DG Chest 1 View  Result Date: 05/13/2019 CLINICAL DATA:  Shortness of breath. 4+ pitting edema. EXAM: CHEST  1 VIEW COMPARISON:  Two-view chest x-ray 03/01/2019 FINDINGS: The heart is enlarged. Atherosclerotic changes are noted at the aortic arch. Patchy interstitial pattern has increased slightly. Small  effusions are suspected. Loop recorder is in place. IMPRESSION: 1. Cardiomegaly with increasing interstitial edema and small effusions compatible with congestive heart failure. 2. Aortic atherosclerosis. Electronically Signed   By: San Morelle M.D.   On: 05/13/2019 21:03   CUP PACEART REMOTE DEVICE CHECK  Result Date: 05/14/2019 Carelink summary report received. Battery status OK. Normal device function. 5 inappropriate AF detections previously reported and evaluated by MD. No new symptom episodes, tachy episodes, brady, or pause episodes. No new AF episodes. Monthly summary reports and ROV/PRN   Scheduled Meds: . aspirin  324 mg Oral Once  . atorvastatin  10 mg Oral q1800  . clopidogrel  75 mg Oral Daily  . enoxaparin (LOVENOX) injection  40 mg Subcutaneous Q24H  . furosemide  40 mg Intravenous Daily  . hydroxyurea  500 mg Oral QODAY  . lisinopril  40 mg Oral QHS  . pantoprazole  40 mg Oral Daily  . cyanocobalamin  1,000 mcg Oral Daily   Continuous Infusions: . potassium chloride       LOS: 1 day   Time spent: 40 minutes.  Lorella Nimrod, MD [facility]  If 7PM-7AM, please contact night-coverage [email or web address]  05/14/2019, 3:03 PM   This record has been created using Systems analyst. Errors have been sought and corrected,but may not always be located. Such creation errors do not reflect on the standard of care.

## 2019-05-14 NOTE — Progress Notes (Signed)
Ch visited wit Pt in response to Consult for prayer. Upon entering the room Ch was greeted by wife [name] at bedside. When [facility] asked if Pt requested for prayer, Pt's wife said yes. Before prayer, nurse came in to give news that his procedure will be done tomorrow her at the hospital and not else where on Thursday. Pt's wife reported that Pt has not been able to swallow well. Pt's wife shared concerns about his well being, that he has been at the hospital every single months from December. Pt's wife described his stroke incident from December as well. Ch prayed with Pt and wife. They were grateful for prayer. Pt's wife asked if chaplains charge for their service. Ch reported that chaplains are employees here, and do not charge separately for visits. Pt and wife expressed wishes for more chaplain visits and prayers. Ch will try to visit them regularly.

## 2019-05-14 NOTE — Consult Note (Signed)
Cephas Darby, MD [address]  [place]  [place], [place] [address]  Main: [phone]  Fax: [phone] Pager: [phone]   Consultation  Referring Provider:     No ref. provider found Primary Care Physician:  Crecencio Mc, MD Primary Gastroenterologist: Dr. Lucilla Lame      Reason for Consultation:     Dysphagia  Date of Admission:  05/13/2019 Date of Consultation:  05/14/2019         HPI:   Jimmy Jimenez is a 86 y.o. male with history of diastolic heart failure, EF of 65 to 70%, pulmonary fibrosis, pulmonary hypertension, essential thrombocytosis, history of CVA, right hemiparesis presented to ER with history of choking sensation with breathing as well as eating or drinking.  He reports choking even when he is not eating.  This was discussed by his PCP who ordered barium esophagogram.  He also reports shortness of breath.  He reports the symptoms have been worsening for the last few weeks with decreasing p.o. intake.  Patient is dependent on ADLs, very minimal activity, walks with walker for short distance only.  He also reports worsening of swelling of his legs.  Patient's wife is bedside  In the ER, chest x-ray revealed diffuse interstitial edema, worse from before as well as small pleural effusions.  Labs revealed normal hemoglobin, essential thrombocytosis, severe hypokalemia potassium 2.8, mildly elevated total bilirubin, BNP 103, at baseline  GI is consulted to evaluate choking sensation and possible dysphagia. Pt reports difficulty swallowing pills, his wife had to crush the pills. He does not have difficulty swallowing water, can swallow liquids but reports regurgitation right away, he has difficulty swallowing solid foods, started 3weeks ago, progressively worse  Patient did not receive Plavix since admission  NSAIDs: None  Antiplts/Anticoagulants/Anti thrombotics: Plavix for history of stroke  GI Procedures: Colonoscopy in 2007, normal  Past Medical  History:  Diagnosis Date  . Colon polyps   . Essential thrombocytosis ([place])   . Essential thrombocytosis ([place])   . Hyperlipidemia   . Hypertension   . Lung nodule 12/2004   found on CXR  . Osteoporosis     Past Surgical History:  Procedure Laterality Date  . BONE MARROW BIOPSY  01/18/07  . COLONOSCOPY  2007, 2010   Dr Allen Norris  . IR CT HEAD LTD  03/04/2019  . IR PERCUTANEOUS ART THROMBECTOMY/INFUSION INTRACRANIAL INC DIAG ANGIO  03/04/2019  . IR RADIOLOGIST EVAL & MGMT  03/20/2019  . IR RADIOLOGIST EVAL & MGMT  04/24/2019  . IR US GUIDE BX ASP/DRAIN  03/07/2019  . IR US GUIDE VASC ACCESS RIGHT  03/04/2019  . LOOP RECORDER INSERTION N/A 03/07/2019   Procedure: LOOP RECORDER INSERTION;  Surgeon: Constance Haw, MD;  Location: [facility];  Service: Cardiovascular;  Laterality: N/A;  . NASAL SINUS SURGERY  06/11/05  . RADIOLOGY WITH ANESTHESIA N/A 03/03/2019   Procedure: IR WITH ANESTHESIA;  Surgeon: Luanne Bras, MD;  Location: [facility];  Service: Radiology;  Laterality: N/A;    Prior to Admission medications   Medication Sig Start Date End Date Taking? Authorizing Provider  Ascorbic Acid (VITAMIN C) 1000 MG tablet Take 1,000 mg by mouth daily.   Yes [provider]  atorvastatin (LIPITOR) 10 MG tablet Take 1 tablet (10 mg total) by mouth daily at 6 PM. 03/08/19  Yes Biby, Massie Kluver, NP  azelastine (ASTELIN) 0.1 % nasal spray Place 2 sprays into both nostrils 2 (two) times daily.  02/10/19  Yes [provider]  benzonatate (TESSALON) 200 MG capsule Take 1 capsule (200 mg total) by mouth 2 (two) times daily as needed for cough. 01/04/19  Yes Crecencio Mc, MD  Cholecalciferol (VITAMIN D3) 25 MCG (1000 UT) CAPS Take 1,000 Units by mouth daily. Take 2 by mouth daily    Yes [provider]  clopidogrel (PLAVIX) 75 MG tablet Take 1 tablet (75 mg total) by mouth daily. 03/09/19  Yes Donzetta Starch, NP  clotrimazole-betamethasone (LOTRISONE) cream Apply topically 2  (two) times daily. 11/28/18  Yes Crecencio Mc, MD  cyanocobalamin 1000 MCG tablet Take 1,000 mcg by mouth daily.    Yes [provider]  docusate sodium (COLACE) 100 MG capsule Take 100 mg by mouth 2 (two) times daily as needed for mild constipation.   Yes [provider]  esomeprazole (NEXIUM) 40 MG capsule TAKE 1 CAPSULE BY MOUTH ONCE DAILY AT NOON 11/27/18  Yes Crecencio Mc, MD  feeding supplement, ENSURE ENLIVE, (ENSURE ENLIVE) LIQD Take 237 mLs by mouth 2 (two) times daily between meals. 03/08/19  Yes Donzetta Starch, NP  furosemide (LASIX) 20 MG tablet Take 1 tablet (20 mg total) by mouth daily. 03/22/19  Yes Crecencio Mc, MD  hydroxyurea (HYDREA) 500 MG capsule TAKE 1 CAPSULE BY MOUTH EVERY OTHER DAY MAY TAKE WITH FOOD TO MINIMIZE GI SIDE EFFECTS. 08/08/18  Yes Cammie Sickle, MD  [place] CREA Apply 1 application topically as needed (after each bowel movement for barrier for  pressure ulcer.). 02/15/19  Yes Crecencio Mc, MD  lisinopril (ZESTRIL) 40 MG tablet Take 1 tablet (40 mg total) by mouth at bedtime. 03/22/19  Yes Crecencio Mc, MD  testosterone cypionate (DEPOTESTOSTERONE CYPIONATE) 200 MG/ML injection INJECT 0.25 ML INTRAMUSCULARLY EVERY 14 DAYS AS DIRECTED 02/16/19  Yes Crecencio Mc, MD    Current Facility-Administered Medications:  .  aspirin chewable tablet 324 mg, 324 mg, Oral, Once, Tu, Ching T, DO .  atorvastatin (LIPITOR) tablet 10 mg, 10 mg, Oral, q1800, Tu, Ching T, DO .  clopidogrel (PLAVIX) tablet 75 mg, 75 mg, Oral, Daily, Tu, Ching T, DO .  enoxaparin (LOVENOX) injection 40 mg, 40 mg, Subcutaneous, Q24H, Tu, Ching T, DO, 40 mg at 05/14/19 0150 .  furosemide (LASIX) injection 40 mg, 40 mg, Intravenous, Daily, Tu, Ching T, DO, 40 mg at 05/14/19 0947 .  hydroxyurea (HYDREA) capsule 500 mg, 500 mg, Oral, QODAY, Tu, Ching T, DO .  lisinopril (ZESTRIL) tablet 40 mg, 40 mg, Oral, QHS, Tu, Ching T, DO .  pantoprazole  (PROTONIX) EC tablet 40 mg, 40 mg, Oral, Daily, Tu, Ching T, DO .  potassium chloride 10 mEq in 100 mL IVPB, 10 mEq, Intravenous, Q1 Hr x 2, Nazari, Walid A, RPH, Last Rate: 100 mL/hr at 05/14/19 1712, 10 mEq at 05/14/19 1712 .  vitamin B-12 (CYANOCOBALAMIN) tablet 1,000 mcg, 1,000 mcg, Oral, Daily, Tu, Ching T, DO  Family History  Problem Relation Age of Onset  . Colon cancer Mother        colon age 40's  . Peripheral vascular disease Father   . Colon cancer Brother        colon age 44's  . Diabetes Other   . Prostate cancer Neg Hx   . Bladder Cancer Neg Hx   . Kidney cancer Neg Hx      Social History   Tobacco Use  . Smoking status: Never Smoker  .  Smokeless tobacco: Never Used  Substance Use Topics  . Alcohol use: No  . Drug use: No    Allergies as of 05/13/2019 - Review Complete 05/13/2019  Allergen Reaction Noted  . Amlodipine  10/13/2010  . Atenolol  10/13/2010  . Erythromycin  12/07/2014  . Furosemide  10/13/2010  . Hctz [hydrochlorothiazide]  12/14/2012  . Hydrochlorothiazide w-triamterene  10/13/2010  . Levofloxacin  10/13/2010  . Metoprolol  10/13/2010  . Micardis [telmisartan]  10/13/2010  . Penicillins Itching 11/20/2010  . Terazosin  10/13/2010  . Triamterene-hctz  10/13/2010    Review of Systems:    All systems reviewed and negative except where noted in HPI.   Physical Exam:  Vital signs in last 24 hours: Temp:  [97.6 F (36.4 C)-98.3 F (36.8 C)] 97.8 F (36.6 C) (03/15 1633) Pulse Rate:  [60-88] 78 (03/15 1633) Resp:  [10-38] 16 (03/15 1633) BP: (112-163)/(51-82) 152/63 (03/15 1633) SpO2:  [91 %-100 %] 100 % (03/15 1633) Weight:  [59.7 kg-68 kg] 59.7 kg (03/15 0941)   General:   Pleasant, cooperative in NAD Head:  Normocephalic and atraumatic, thin built, bitemporal wasting. Eyes:   No icterus.   Conjunctiva pink. PERRLA. Ears:  Normal auditory acuity. Neck:  Supple; no masses or thyroidomegaly Lungs: Respirations even and unlabored.  Lungs clear to auscultation bilaterally.   No wheezes, crackles, or rhonchi.  Heart:  Regular rate and rhythm;  Without murmur, clicks, rubs or gallops Abdomen:  Soft, nondistended, nontender. Normal bowel sounds. No appreciable masses or hepatomegaly.  No rebound or guarding.  Rectal:  Not performed. Msk:  Symmetrical without gross deformities.  Right-sided weakness Extremities: 3+edema, cyanosis or clubbing. Neurologic:  Alert and oriented x3;  grossly normal neurologically. Skin:  Intact without significant lesions or rashes. Psych:  Alert and cooperative. Normal affect.  LAB RESULTS: CBC Latest Ref Rng & Units 05/14/2019 05/13/2019 05/12/2019  WBC 4.0 - 10.5 K/uL 10.3 10.3 9.0  Hemoglobin 13.0 - 17.0 g/dL 13.9 13.9 14.3  Hematocrit 39.0 - 52.0 % 42.5 42.1 43.6  Platelets 150 - 400 K/uL 436(H) 416(H) 434(H)    BMET BMP Latest Ref Rng & Units 05/14/2019 05/13/2019 05/12/2019  Glucose 70 - 99 mg/dL 58(L) 82 111(H)  BUN 8 - 23 mg/dL 14 13 17   Creatinine 0.61 - 1.24 mg/dL 0.90 0.81 0.96  Sodium 135 - 145 mmol/L 138 135 138  Potassium 3.5 - 5.1 mmol/L 2.8(L) 2.9(L) 2.8(L)  Chloride 98 - 111 mmol/L 85(L) 86(L) 87(L)  CO2 22 - 32 mmol/L 35(H) 37(H) 40(H)  Calcium 8.9 - 10.3 mg/dL 9.3 9.5 9.3    LFT Hepatic Function Latest Ref Rng & Units 05/13/2019 05/12/2019 03/31/2019  Total Protein 6.5 - 8.1 g/dL 6.6 6.7 7.0  Albumin 3.5 - 5.0 g/dL 4.0 4.0 4.1  AST 15 - 41 U/L 24 20 25   ALT 0 - 44 U/L 12 14 19   Alk Phosphatase 38 - 126 U/L 71 66 86  Total Bilirubin 0.3 - 1.2 mg/dL 1.7(H) 1.2 1.3(H)  Bilirubin, Direct 0.1 - 0.5 mg/dL - - -     STUDIES: DG Chest 1 View  Result Date: 05/13/2019 CLINICAL DATA:  Shortness of breath. 4+ pitting edema. EXAM: CHEST  1 VIEW COMPARISON:  Two-view chest x-ray 03/01/2019 FINDINGS: The heart is enlarged. Atherosclerotic changes are noted at the aortic arch. Patchy interstitial pattern has increased slightly. Small effusions are suspected. Loop recorder is in  place. IMPRESSION: 1. Cardiomegaly with increasing interstitial edema and small effusions compatible with congestive  heart failure. 2. Aortic atherosclerosis. Electronically Signed   By: San Morelle M.D.   On: 05/13/2019 21:03   CUP PACEART REMOTE DEVICE CHECK  Result Date: 05/14/2019 [facility] summary report received. Battery status OK. Normal device function. 5 inappropriate AF detections previously reported and evaluated by MD. No new symptom episodes, tachy episodes, brady, or pause episodes. No new AF episodes. Monthly summary reports and ROV/PRN     Impression / Plan:   Jimmy Jimenez is a 86 y.o. male with with history of diastolic heart failure, pulmonary fibrosis, pulmonary hypertension, essential thrombocytosis presents with CHF exacerbation as well as choking sensation, patient also with history of stroke with right-sided hemiparesis  Choking sensation Given his history of underlying stroke, recommend formal speech pathology evaluation before proceeding with upper endoscopy Also, recommend barium esophagogram to evaluate for any gross structural abnormalities of the esophagus Hypokalemia need to be corrected before proceeding with upper endoscopy With his history of underlying CHF and chest x-ray revealing worsening of interstitial edema, recommend patient's respiratory and cardiovascular status to be optimized before proceeding with endoscopic evaluation Continue to hold Plavix Start pantoprazole 40 mg IV twice daily  Discussed my recommendations with patient and his wife  Thank you for involving me in the care of this patient.  GI will follow along with you    LOS: 1 day   Sherri Sear, MD  05/14/2019, 6:03 PM   Note: This dictation was prepared with Dragon dictation along with smaller phrase technology. Any transcriptional errors that result from this process are unintentional.

## 2019-05-14 NOTE — ED Notes (Signed)
Admitting MD made aware that per night shift RN patient only received 3 runs of IV K due to order expiring and patient did not receive 4th run, repeat K this AM 2.8. Admitting MD made aware via secure chat at this time.

## 2019-05-14 NOTE — Telephone Encounter (Signed)
FYI , patient currently admitted called nurse line on 05/13/19.  [facility] Night - Cl TELEPHONE ADVICE RECORD AccessNurse Patient Name: Jimmy Jimenez Gender: Male DOB: 03/13/1925 Age: 86 Y 2 M 2 D Return Phone Number: [phone] (Primary) Address: City/State/Zip: [place] [address] Client [facility] Night - Cl Client Site [place] Physician Deborra Medina - MD Contact Type Call Who Is Calling Patient / Member / Family / Caregiver Call Type Triage / Clinical Caller Name KHALEL ELIOPOULOS Relationship To Patient Spouse Return Phone Number [phone] (Primary) Chief Complaint Swallowing Difficulty Reason for Call Request to Reschedule Office Appointment Initial Comment Caller says husband says something in his causing him to choke when he tries to eat or drink, can not eat, says he can't swallow his pills--would like to make an appt [facility] ED Translation No Nurse Assessment Nurse: Marcello Moores, RN, Cheri Date/Time (Eastern Time): 05/13/2019 7:06:17 PM Confirm and document reason for call. If symptomatic, describe symptoms. ---Caller states patient has been not been able to eat or take his medicines because he feels like he is choking when he does. Feels like he has something stuck in his throat. Has the patient had close contact with a person known or suspected to have the novel coronavirus illness OR traveled / lives in area with major community spread (including international travel) in the last 14 days from the onset of symptoms? * If Asymptomatic, screen for exposure and travel within the last 14 days. ---No Does the patient have any new or worsening symptoms? ---Yes Will a triage be completed? ---Yes Related visit to physician within the last 2 weeks? ---No Does the PT have any chronic conditions? (i.e. diabetes, asthma, this includes High risk  factors for pregnancy, etc.) ---Yes List chronic conditions. ---HTN, CVA, pancreatic mass Is this a behavioral health or substance abuse call? ---No Guidelines Guideline Title Affirmed Question Affirmed Notes Nurse Date/Time (Eastern Time) Swallowing Difficulty Symptoms of food or bone stuck in throat or Marcello Moores, RN, Cheri 05/13/2019 6:11:53 PMPLEASE NOTE: All timestamps contained within this report are represented as [place] Standard Time. CONFIDENTIALTY NOTICE: This fax transmission is intended only for the addressee. It contains information that is legally privileged, confidential or otherwise protected from use or disclosure. If you are not the intended recipient, you are strictly prohibited from reviewing, disclosing, copying using or disseminating any of this information or taking any action in reliance on or regarding this information. If you have received this fax in error, please notify us immediately by telephone so that we can arrange for its return to [place]. Phone: [phone], Toll-Free: [phone], Fax: [phone] Page: 2 of 2 Call Id: [id number] Guidelines Guideline Title Affirmed Question Affirmed Notes Nurse Date/Time Eilene Ghazi Time) esophagus (e.g., pain in throat or chest, FB sensation, blood-tinged saliva) Disp. Time Eilene Ghazi Time) Disposition Final User 05/13/2019 6:16:58 PM Go to ED Now Yes Marcello Moores, RN, Cheri Caller Disagree/Comply Comply Caller Understands Yes PreDisposition Did not know what to do Care Advice Given Per Guideline GO TO ED NOW: * You need to be seen in the Emergency Department. * Go to the ED at ___________ [place] now. Drive carefully. NOTHING BY MOUTH: Do not eat or drink anything for now. (Reason: condition may require sedation, anesthesia, and endoscopy) CARE ADVICE given per Swallowed Difficulty (Adult) guideline. Comments User: Marvis Repress, RN Date/Time Eilene Ghazi Time): 05/13/2019 6:11:37 PM Patient also has GERD and  hypercholesterolemia Referrals GO  TO FACILITY OTHER - SPECIFY

## 2019-05-14 NOTE — ED Notes (Signed)
Pt given 2 warm blankets, permission given by patient to update his wife. Pt's wife updated by this RN.

## 2019-05-14 NOTE — ED Notes (Signed)
This RN to bedside with Ena Dawley, Therapist, sports. This RN introduced self to patient. Recollect labs sent to lab by Ena Dawley, RN. Pt repositioned in bed by this RN and Ena Dawley, Therapist, sports. Call bell remains in reach of patient at this time. Pt states he is cold, explained will bring in blankets for patient. Pt is alert and oriented at this time. Lights dimmed for patient comfort. Pt noted to complain of neck pain with repositioning, states "I got a crick in my neck". Pt noted to be front leaning with his neck at baseline. Pt remains wearing 2L via  at this time.

## 2019-05-14 NOTE — Evaluation (Signed)
Clinical/Bedside Swallow Evaluation Patient Details  Name: Jimmy Jimenez MRN: [id number] Date of Birth: 12/18/1925  Today's Date: 05/14/2019 Time: SLP Start Time (ACUTE ONLY): 1320 SLP Stop Time (ACUTE ONLY): 1427 SLP Time Calculation (min) (ACUTE ONLY): 67 min  Past Medical History:  Past Medical History:  Diagnosis Date  . Colon polyps   . Essential thrombocytosis ([place])   . Essential thrombocytosis ([place])   . Hyperlipidemia   . Hypertension   . Lung nodule 12/2004   found on CXR  . Osteoporosis    Past Surgical History:  Past Surgical History:  Procedure Laterality Date  . BONE MARROW BIOPSY  01/18/07  . COLONOSCOPY  2007, 2010   Dr Allen Norris  . IR CT HEAD LTD  03/04/2019  . IR PERCUTANEOUS ART THROMBECTOMY/INFUSION INTRACRANIAL INC DIAG ANGIO  03/04/2019  . IR RADIOLOGIST EVAL & MGMT  03/20/2019  . IR RADIOLOGIST EVAL & MGMT  04/24/2019  . IR US GUIDE BX ASP/DRAIN  03/07/2019  . IR US GUIDE VASC ACCESS RIGHT  03/04/2019  . LOOP RECORDER INSERTION N/A 03/07/2019   Procedure: LOOP RECORDER INSERTION;  Surgeon: Constance Haw, MD;  Location: [facility];  Service: Cardiovascular;  Laterality: N/A;  . NASAL SINUS SURGERY  06/11/05  . RADIOLOGY WITH ANESTHESIA N/A 03/03/2019   Procedure: IR WITH ANESTHESIA;  Surgeon: Luanne Bras, MD;  Location: [facility];  Service: Radiology;  Laterality: N/A;   HPI: Per admitting History and Physical:   " Jimmy Jimenez is a 86 y.o. 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  For the past 3 weeks patient has been complaining of a choking sensation even when he is not eating.  He also has had issues with hypersalivation for years.   Patient became more concerned as he has been unable to eat food or keep down his medications.  He discussed this issue with his PCP and had plan for a upper GI series this coming Thursday but wife could not wait any longer since his symptoms appeared to be getting worse.  He denies any symptoms of chest pain or shortness of breath.  No nausea or vomiting although he does spit up every time he tries to take any p.o.  Denies any abdominal pain. Wife thinks he has been having progressive shortness of breath but could have been ongoing for months. States it improves when his legs are propped up. Patient is able to use a walker for short distance but mostly wheelchair-bound for long distance.  Of note, he was just evaluated yesterday in the ED for the same symptoms and had a negative CT head and was discharged home with outpatient GI follow-up."  Assessment / Plan / Recommendation Clinical Impression  Jimmy Jimenez is a 86 YO male admitted with poor PO intake and inability to swallow. He was alert and pleasant and his wife was present and supportive for the bedside swallow eval today. Pt and wife report that Jimmy Jimenez has had very little PO intake for the last few weeks complaining of difficulty swallowing. He was scheduled for an Upper GI series and  Mod Ba swallow study as an outpatient but his dysphagia became significant enough that is unable to maintain nutrition and hydration at home,  thus being admitted to the hospital for further assessment. Today, Pt was noted to continuously split clear phlegm into emesis bag after each bite or sip given. Pt tolerated a few sips of water, 2 small bites of applesauce and one piece of ice with no s/s of aspiration. Noted multiple swallows per bolus likely indicating pharyngeal residue. Pt often reported "it barely went through" and needed encouragement to try each sip. Oral holding with each attempt as Pt was fearful of swallowing. Rec inpatient GI consult for possible EGD.  Pt may be able to tolerate a Ba swallow but it will likely be limited with the amount of barium Pt is able to swallow. Recommendations of both given to MD in secure chat. GI consult was recommended by admitting MD as well. Pt may have sips of thin liquids and bites applesauce with crushed meds as he tolerated these boluses without s/s of aspiration. ST to f/u with Ba swallow and GI consult if ordered to determine diet consistency needs. Discussed with Patient and wife. SLP Visit Diagnosis: Dysphagia, oropharyngeal phase (R13.12)    Aspiration Risk  Mild aspiration risk;Risk for inadequate nutrition/hydration    Diet Recommendation Other (Comment)(May have sips of water and bited of applesauce)   Liquid Administration via: Straw Medication Administration: Crushed with puree Compensations: Small sips/bites;Multiple dry swallows after each bite/sip Postural Changes: Seated upright at 90 degrees    Other  Recommendations Recommended Consults: Consider GI evaluation;Consider esophageal assessment   Follow up Recommendations    GI Consult, Ba swallow    Frequency and Duration min 1 x/week  1 week       Prognosis        Swallow Study   General Date of Onset: 05/13/19 Type of Study: Bedside Swallow Evaluation Previous Swallow Assessment: plans for UGI this week. Noted order for Mod ba swallow as OP  Diet Prior to this Study: NPO Temperature Spikes Noted: No Behavior/Cognition: Alert;Cooperative;Pleasant mood Oral Cavity Assessment: Within Functional Limits Vision: Functional for self-feeding Self-Feeding Abilities: Able to feed self Patient Positioning: Upright in bed Baseline Vocal Quality: Hoarse;Low vocal intensity    Oral/Motor/Sensory Function Overall Oral Motor/Sensory Function: Within functional limits   Ice Chips Ice chips: Impaired Presentation: Spoon Oral Phase Impairments: Other (comment)(bolus hold) Oral Phase Functional Implications: Oral holding   Thin Liquid Thin  Liquid: Impaired Presentation: Spoon;Straw Pharyngeal  Phase Impairments: Other (comments)(C/o "it wants to come back up")    Nectar Thick     Honey Thick     Puree Puree: Impaired Presentation: Spoon Oral Phase Functional Implications: Oral holding Pharyngeal Phase Impairments: Other (comments)(C/o "it barely went down" refused after 2 bites)   Solid     Solid: Not tested      Lucila Maine 05/14/2019,2:27 PM

## 2019-05-15 ENCOUNTER — Telehealth: Payer: Self-pay | Admitting: Internal Medicine

## 2019-05-15 ENCOUNTER — Encounter: Payer: Self-pay | Admitting: Family Medicine

## 2019-05-15 ENCOUNTER — Inpatient Hospital Stay: Payer: No Typology Code available for payment source

## 2019-05-15 LAB — CBC
HCT: 41.6 % (ref 39.0–52.0)
Hemoglobin: 13.6 g/dL (ref 13.0–17.0)
MCH: 32.5 pg (ref 26.0–34.0)
MCHC: 32.7 g/dL (ref 30.0–36.0)
MCV: 99.3 fL (ref 80.0–100.0)
Platelets: 414 10*3/uL — ABNORMAL HIGH (ref 150–400)
RBC: 4.19 MIL/uL — ABNORMAL LOW (ref 4.22–5.81)
RDW: 13.2 % (ref 11.5–15.5)
WBC: 9.6 10*3/uL (ref 4.0–10.5)
nRBC: 0 % (ref 0.0–0.2)

## 2019-05-15 LAB — BASIC METABOLIC PANEL
Anion gap: 17 — ABNORMAL HIGH (ref 5–15)
BUN: 18 mg/dL (ref 8–23)
CO2: 35 mmol/L — ABNORMAL HIGH (ref 22–32)
Calcium: 9.3 mg/dL (ref 8.9–10.3)
Chloride: 88 mmol/L — ABNORMAL LOW (ref 98–111)
Creatinine, Ser: 0.95 mg/dL (ref 0.61–1.24)
GFR calc Af Amer: >60 mL/min (ref >60)
GFR calc non Af Amer: >60 mL/min (ref >60)
Glucose, Bld: 60 mg/dL — ABNORMAL LOW (ref 70–99)
Potassium: 3.5 mmol/L (ref 3.5–5.1)
Sodium: 140 mmol/L (ref 135–145)

## 2019-05-15 MED ORDER — ADULT MULTIVITAMIN LIQUID CH
15.0000 mL | Freq: Every day | ORAL | Status: DC
  Filled 2019-05-15: qty 15

## 2019-05-15 MED ORDER — ENSURE ENLIVE PO LIQD: 237.0000 mL | Freq: Three times a day (TID) | ORAL | Status: DC

## 2019-05-15 NOTE — Progress Notes (Signed)
Initial Nutrition Assessment  DOCUMENTATION CODES:   Severe malnutrition in context of chronic illness  INTERVENTION:   Ensure Enlive po TID, each supplement provides 350 kcal and 20 grams of protein  Magic cup TID with meals, each supplement provides 290 kcal and 9 grams of protein  Liquid MVI daily   Recommend vitamin C 521m po BID once pt is able to swallow meds  NUTRITION DIAGNOSIS:   Severe Malnutrition related to chronic illness(pulmonary fibrosis, CVA, dysphagia) as evidenced by 11 percent weight loss in 1 month, moderate to severe fat depletions, moderate to severe muscle depletions.  GOAL:   Patient will meet greater than or equal to 90% of their needs  MONITOR:   PO intake, Supplement acceptance, Labs, Weight trends, Skin, I & O's  REASON FOR ASSESSMENT:   Malnutrition Screening Tool    ASSESSMENT:   86y.o. 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   Met with pt and pt's wife in room today. Pt reports dysphagia that has been going on for the past 3 weeks. Pt reports that he has been having trouble swallowing liquids and solids and it has been getting progressively more difficult to swallow. Pt reports that he is unable to get down even water now. Pt reports that he was drinking Ensure at one point but reports that he can't get it down now. Pt also denies being able to get down ice cream. Pt being followed by SLP and GI. Pt s/p limited  barium esophagogram today as he was unable to get down all the contrast. Per MD note, plan is for EGD tomorrow. RD will follow and add supplements as appropriate. Recommend vitamin C supplementation once pt is able to swallow meds as pt with ecchymosis and wounds.   Per  chart, pt is down 15lbs(11%) over the past month; this is severe weight loss.   Medications reviewed and include: aspirin, plavix, lovenox, lasix, B12  Labs reviewed: K 3.5 wnl  NUTRITION - FOCUSED PHYSICAL EXAM:    Most Recent Value  Orbital Region  Moderate depletion  Upper Arm Region  Severe depletion  Thoracic and Lumbar Region  Severe depletion  Buccal Region  Moderate depletion  Temple Region  Moderate depletion  Clavicle Bone Region  Severe depletion  Clavicle and Acromion Bone Region  Severe depletion  Scapular Bone Region  Moderate depletion  Dorsal Hand  Severe depletion  Patellar Region  Moderate depletion  Anterior Thigh Region  Unable to assess  Posterior Calf Region  Unable to assess  Edema (RD Assessment)  Severe  Hair  Reviewed  Eyes  Reviewed  Mouth  Reviewed  Skin  Reviewed [ecchymosis]  Nails  Reviewed     Diet Order:   Diet Order            Diet NPO time specified Except for: Ice Chips, Sips with Meds  Diet effective midnight        Diet full liquid Room service appropriate? Yes; Fluid consistency: Thin  Diet effective now             EDUCATION NEEDS:   Education needs have been addressed  Skin:  Skin Assessment: Reviewed RN Assessment(Apex of the gluteal fold; 1.5cm x 1.0cm x 0.1cm2. Right forearm; 1.0cm x1.5cmx 0.1cm)  Last BM:  3/14  Height:   Ht Readings from Last 1 Encounters:  05/14/19 _0  (1.727 m)    Weight:   Wt Readings from Last 1 Encounters:  05/15/19 56.6 kg    Ideal Body Weight:  70 kg  BMI:  Body mass index is 18.98 kg/m.  Estimated Nutritional Needs:   Kcal:  1600-1800kcal/day  Protein:  80-90g/day  Fluid:  >1.4L/day  Koleen Distance MS, RD, LDN Contact information available in Amion

## 2019-05-15 NOTE — Plan of Care (Signed)
Patient remains unable to swallow small amounts of anything. Refused medication as he was not able to swallow it even crushed in applesauce. Patient will call out stating he is choking and starts crying even if he has not ate anything and spits up minimal phlegm. Consoling given to patient however patient is very frustrated over his situation.   Problem: Education: Goal: Knowledge of General Education information will improve Description: Including pain rating scale, medication(s)/side effects and non-pharmacologic comfort measures Outcome: Progressing   Problem: Health Behavior/Discharge Planning: Goal: Ability to manage health-related needs will improve Outcome: Not Progressing   Problem: Activity: Goal: Risk for activity intolerance will decrease Outcome: Not Progressing   Problem: Nutrition: Goal: Adequate nutrition will be maintained Outcome: Not Progressing

## 2019-05-15 NOTE — Plan of Care (Signed)
  Problem: Coping: Goal: Ability to verbalize feelings about incontinence will improve Outcome: Progressing   Problem: Urinary Elimination: Goal: Pelvic muscle control will improve Outcome: Progressing

## 2019-05-15 NOTE — Progress Notes (Signed)
Cephas Darby, MD [address]  [place]  [place], [place] [address]  Main: [phone]  Fax: [phone] Pager: [phone]   Subjective: Patient underwent barium esophagogram today, unable to swallow entire contrast.  On this limited study, upper esophagus appeared normal.  Patient continues to report choking sensation Swelling of legs has improved, patient is oxygenating well on room air  Objective: Vital signs in last 24 hours: Vitals:   05/14/19 2030 05/15/19 0514 05/15/19 0756 05/15/19 1145  BP: 140/61 (!) 128/56 (!) 131/57 (!) 141/76  Pulse: 84 79 77 94  Resp: 19 18 17 17   Temp: 98.1 F (36.7 C) 98.3 F (36.8 C) 97.7 F (36.5 C) 97.6 F (36.4 C)  TempSrc: Oral Oral    SpO2: 100% 100% 100% 100%  Weight:  56.6 kg    Height:       Weight change: -8.301 kg  Intake/Output Summary (Last 24 hours) at 05/15/2019 1512 Last data filed at 05/15/2019 1459 Gross per 24 hour  Intake 0 ml  Output 950 ml  Net -950 ml     Exam: Heart:: Regular rate and rhythm, S1S2 present or without murmur or extra heart sounds Lungs: normal and clear to auscultation Abdomen: soft, nontender, normal bowel sounds   Lab Results: CBC Latest Ref Rng & Units 05/15/2019 05/14/2019 05/13/2019  WBC 4.0 - 10.5 K/uL 9.6 10.3 10.3  Hemoglobin 13.0 - 17.0 g/dL 13.6 13.9 13.9  Hematocrit 39.0 - 52.0 % 41.6 42.5 42.1  Platelets 150 - 400 K/uL 414(H) 436(H) 416(H)   CMP Latest Ref Rng & Units 05/15/2019 05/14/2019 05/13/2019  Glucose 70 - 99 mg/dL 60(L) 58(L) 82  BUN 8 - 23 mg/dL 18 14 13   Creatinine 0.61 - 1.24 mg/dL 0.95 0.90 0.81  Sodium 135 - 145 mmol/L 140 138 135  Potassium 3.5 - 5.1 mmol/L 3.5 2.8(L) 2.9(L)  Chloride 98 - 111 mmol/L 88(L) 85(L) 86(L)  CO2 22 - 32 mmol/L 35(H) 35(H) 37(H)  Calcium 8.9 - 10.3 mg/dL 9.3 9.3 9.5  Total Protein 6.5 - 8.1 g/dL - - 6.6  Total Bilirubin 0.3 - 1.2 mg/dL - - 1.7(H)  Alkaline Phos 38 - 126 U/L - - 71  AST 15 - 41 U/L - - 24  ALT 0 - 44  U/L - - 12    Micro Results: Recent Results (from the past 240 hour(s))  SARS CORONAVIRUS 2 (TAT 6-24 HRS) Nasopharyngeal Nasopharyngeal Swab     Status: None   Collection Time: 05/13/19 10:17 PM   Specimen: Nasopharyngeal Swab  Result Value Ref Range Status   SARS Coronavirus 2 NEGATIVE NEGATIVE Final    Comment: (NOTE) SARS-CoV-2 target nucleic acids are NOT DETECTED. The SARS-CoV-2 RNA is generally detectable in upper and lower respiratory specimens during the acute phase of infection. Negative results do not preclude SARS-CoV-2 infection, do not rule out co-infections with other pathogens, and should not be used as the sole basis for treatment or other patient management decisions. Negative results must be combined with clinical observations, patient history, and epidemiological information. The expected result is Negative. Fact Sheet for Patients: SugarRoll.be Fact Sheet for Healthcare Providers: https://www.woods-mathews.com/ This test is not yet approved or cleared by the [place] FDA and  has been authorized for detection and/or diagnosis of SARS-CoV-2 by FDA under an Emergency Use Authorization (EUA). This EUA will remain  in effect (meaning this test can be used) for the duration of the COVID-19 declaration under Section 56 4(b)(1) of the  Act, 21 U.S.C. section 360bbb-3(b)(1), unless the authorization is terminated or revoked sooner. Performed at [facility], [place] [address], [place], [place] [address]    Studies/Results: DG Chest 1 View  Result Date: 05/13/2019 CLINICAL DATA:  Shortness of breath. 4+ pitting edema. EXAM: CHEST  1 VIEW COMPARISON:  Two-view chest x-ray 03/01/2019 FINDINGS: The heart is enlarged. Atherosclerotic changes are noted at the aortic arch. Patchy interstitial pattern has increased slightly. Small effusions are suspected. Loop recorder is in place. IMPRESSION: 1. Cardiomegaly with increasing  interstitial edema and small effusions compatible with congestive heart failure. 2. Aortic atherosclerosis. Electronically Signed   By: San Morelle M.D.   On: 05/13/2019 21:03   DG UGI W DOUBLE CM (HD BA)  Result Date: 05/15/2019 CLINICAL DATA:  Indigestion. EXAM: UPPER GI SERIES WITHOUT KUB TECHNIQUE: Routine upper GI series was performed with thin density barium. FLUOROSCOPY TIME:  Fluoroscopy Time:  1 minutes 6 seconds Radiation Exposure Index (if provided by the fluoroscopic device): 4.0 mGy Number of Acquired Spot Images: 3 COMPARISON:  Chest x-ray 05/13/2019, 03/01/2019, 08/06/2015. FINDINGS: Patient could only take 1-2 small sips of barium and could not drink anymore due to choking sensation. Upper most portion of the esophagus appears patent and is nondilated. Lower esophagus and stomach could not be evaluated. Follow-up chest x-ray and KUB can be obtained to demonstrate passage of what little bit of barium the patient was able to swallow. Chronic interstitial pulmonary disease noted. IMPRESSION: Very limited and incomplete exam as above. Electronically Signed   By: Marcello Moores  Register   On: 05/15/2019 09:45   Medications:  I have reviewed the patient's current medications. Prior to Admission:  Facility-Administered Medications Prior to Admission  Medication Dose Route Frequency Provider Last Rate Last Admin  . testosterone cypionate (DEPOTESTOSTERONE CYPIONATE) injection 200 mg  200 mg Intramuscular Q14 Days Crecencio Mc, MD   200 mg at 05/10/19 1046   Medications Prior to Admission  Medication Sig Dispense Refill Last Dose  . Ascorbic Acid (VITAMIN C) 1000 MG tablet Take 1,000 mg by mouth daily.   05/13/2019 at Unknown time  . atorvastatin (LIPITOR) 10 MG tablet Take 1 tablet (10 mg total) by mouth daily at 6 PM. 30 tablet 2 05/12/2019 at Unknown time  . azelastine (ASTELIN) 0.1 % nasal spray Place 2 sprays into both nostrils 2 (two) times daily.    05/13/2019 at Unknown time  .  benzonatate (TESSALON) 200 MG capsule Take 1 capsule (200 mg total) by mouth 2 (two) times daily as needed for cough. 20 capsule 0 prn at prn  . Cholecalciferol (VITAMIN D3) 25 MCG (1000 UT) CAPS Take 1,000 Units by mouth daily. Take 2 by mouth daily    05/13/2019 at Unknown time  . clopidogrel (PLAVIX) 75 MG tablet Take 1 tablet (75 mg total) by mouth daily. 30 tablet 2 05/13/2019 at Unknown time  . clotrimazole-betamethasone (LOTRISONE) cream Apply topically 2 (two) times daily. 90 g 5 05/13/2019 at Unknown time  . cyanocobalamin 1000 MCG tablet Take 1,000 mcg by mouth daily.    05/13/2019 at Unknown time  . docusate sodium (COLACE) 100 MG capsule Take 100 mg by mouth 2 (two) times daily as needed for mild constipation.   05/13/2019 at Unknown time  . esomeprazole (NEXIUM) 40 MG capsule TAKE 1 CAPSULE BY MOUTH ONCE DAILY AT NOON 90 capsule 3 05/12/2019 at Unknown time  . feeding supplement, ENSURE ENLIVE, (ENSURE ENLIVE) LIQD Take 237 mLs by mouth 2 (two)  times daily between meals. 237 mL 12 05/13/2019 at Unknown time  . furosemide (LASIX) 20 MG tablet Take 1 tablet (20 mg total) by mouth daily. 90 tablet 1 05/13/2019 at Unknown time  . hydroxyurea (HYDREA) 500 MG capsule TAKE 1 CAPSULE BY MOUTH EVERY OTHER DAY MAY TAKE WITH FOOD TO MINIMIZE GI SIDE EFFECTS. 60 capsule 3 05/12/2019 at Unknown time  . Infant Care Products (DERMACLOUD) CREA Apply 1 application topically as needed (after each bowel movement for barrier for  pressure ulcer.). 430 g 2 05/13/2019 at Unknown time  . lisinopril (ZESTRIL) 40 MG tablet Take 1 tablet (40 mg total) by mouth at bedtime. 90 tablet 0 05/12/2019 at Unknown time  . testosterone cypionate (DEPOTESTOSTERONE CYPIONATE) 200 MG/ML injection INJECT 0.25 ML INTRAMUSCULARLY EVERY 14 DAYS AS DIRECTED 10 mL 1 Past Week at Unknown time   Scheduled: . aspirin  324 mg Oral Once  . atorvastatin  10 mg Oral q1800  . clopidogrel  75 mg Oral Daily  . enoxaparin (LOVENOX) injection  40 mg  Subcutaneous Q24H  . furosemide  40 mg Intravenous Daily  . hydroxyurea  500 mg Oral QODAY  . lisinopril  40 mg Oral QHS  . pantoprazole (PROTONIX) IV  40 mg Intravenous Q12H  . cyanocobalamin  1,000 mcg Oral Daily   Continuous:  PRN: Anti-infectives (From admission, onward)   None     Scheduled Meds: . aspirin  324 mg Oral Once  . atorvastatin  10 mg Oral q1800  . clopidogrel  75 mg Oral Daily  . enoxaparin (LOVENOX) injection  40 mg Subcutaneous Q24H  . furosemide  40 mg Intravenous Daily  . hydroxyurea  500 mg Oral QODAY  . lisinopril  40 mg Oral QHS  . pantoprazole (PROTONIX) IV  40 mg Intravenous Q12H  . cyanocobalamin  1,000 mcg Oral Daily   Continuous Infusions: PRN Meds:.   Assessment: Principal Problem:   Acute on chronic congestive heart failure (HCC) Active Problems:   Essential thrombocytosis (HCC)   Essential hypertension   Mass of pancreas   History of CVA (cerebrovascular accident)   Hypokalemia   Dysphagia   Choking sensation   Plan: Choking sensation, ?  Dysphagia Limited barium esophagogram Recommend speech pathology evaluation to evaluate for oropharyngeal dysphagia given history of previous stroke We will perform upper endoscopy tomorrow N.p.o. past midnight   LOS: 2 days   Keondra Haydu 05/15/2019, 3:12 PM

## 2019-05-15 NOTE — Progress Notes (Signed)
PROGRESS NOTE    Jimmy Jimenez  [id number] DOB: 1925/03/19 DOA: 05/13/2019 PCP: Crecencio Mc, MD   Brief Narrative:  Jimmy Jimenez is a 86 y.o. 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  He also has had issues with hypersalivation for years.  Patient became more concerned as he has been unable to eat food or keep down his medications.  She was seen in a day prior to admission in ED for similar symptoms, negative CT head and was discharged home to follow-up with his GI. He was hypoxic on presentation requiring 2 L, potassium of 2.9 with lower extremity edema and chest x-ray with interstitial edema.  Admitted for CHF exacerbation.  Subjective: Patient was complaining of something sticking in his throat and he is unable to swallow.  Denies any shortness of breath.  Assessment & Plan:   Principal Problem:   Acute on chronic congestive heart failure (HCC) Active Problems:   Essential thrombocytosis (HCC)   Essential hypertension   Mass of pancreas   History of CVA (cerebrovascular accident)   Hypokalemia   Dysphagia   Choking sensation  Acute hypoxic respiratory failure in the setting of acute on chronic diastolic heart failure and history of pulmonary fibrosis.  Patient was unable to take his medications for the past few days due to difficulty swallowing. Saturation improves with diuresis. -Continue IV Lasix 40 mg daily. -Continue daily weight and strict intake and output.  Dysphagia  Swallow evaluation was done, low risk for aspiration but they are recommending GI consult. He has incomplete study for esophageal gram  today as he was unable to drink whole contrast. -GI was consulted-appreciate their recommendations. -Going for EGD tomorrow.  Pancreatic cyst/intraductal papillary neoplasm. Patient apparently has an history of pancreatic head cystic mass which is growing in size and CA 19-9 was elevating.  Being investigated by [facility] and oncology.  He was planned for EUS got delayed due to his CVA in January.  According to their note patient was interested in surgery if diagnosed with cancer. EUS is normally not done here and he needs to keep his appointment for Thursday.  Elevated troponin.  Most likely secondary to demand, started trending down.  Hypokalemia.  Resolved today. -Monitor electrolytes replete as needed.  History of CVA with right hemiparesis Continue Plavix  Essential thrombocytosis Stable Continue hydroxyurea.  Hypertension Continue lisinopril  Objective: Vitals:   05/14/19 2030 05/15/19 0514 05/15/19 0756 05/15/19 1145  BP: 140/61 (!) 128/56 (!) 131/57 (!) 141/76  Pulse: 84 79 77 94  Resp: 19 18 17 17   Temp: 98.1 F (36.7 C) 98.3 F (36.8 C) 97.7 F (36.5 C) 97.6 F (36.4 C)  TempSrc: Oral Oral    SpO2: 100% 100% 100% 100%  Weight:  56.6 kg    Height:        Intake/Output Summary (Last 24 hours) at 05/15/2019 1538 Last data filed at 05/15/2019 1459 Gross per 24 hour  Intake 0 ml  Output 950 ml  Net -950 ml   Filed Weights   05/13/19 2016 05/14/19 0941 05/15/19 0514  Weight: 68 kg 59.7 kg 56.6 kg    Examination:  General exam: Conically ill-appearing, emaciated  elderly man, appears calm and comfortable  Respiratory system: Clear to auscultation. Respiratory effort normal. Cardiovascular system: S1 & S2 heard, RRR. No JVD, murmurs, rubs, gallops or clicks. Gastrointestinal system: Soft, nontender, nondistended, bowel sounds positive. Central nervous system: Alert and oriented.  Extremities: 2+ LE edema, no cyanosis, pulses intact and  symmetrical. Psychiatry: Judgement and insight appear normal.     DVT prophylaxis: Lovenox Code Status: Partial/DNI Family Communication: No family at bedside. Disposition Plan: Pending improvement and work-up.  Consultants:   GI  Procedures:  Antimicrobials:   Data Reviewed: I have personally reviewed following labs and imaging studies  CBC: Recent Labs  Lab 05/12/19 0951 05/13/19 2058 05/14/19 0714 05/15/19 0457  WBC 9.0 10.3 10.3 9.6  NEUTROABS  --  7.6  --   --   HGB 14.3 13.9 13.9 13.6  HCT 43.6 42.1 42.5 41.6  MCV 98.6 96.8 97.5 99.3  PLT 434* 416* 436* [id number]   Basic Metabolic Panel: Recent Labs  Lab 05/12/19 0951 05/13/19 2058 05/14/19 0714 05/15/19 0457  NA 138 135 138 140  K 2.8* 2.9* 2.8* 3.5  CL 87* 86* 85* 88*  CO2 40* 37* 35* 35*  GLUCOSE 111* 82 58* 60*  BUN 17 13 14 18   CREATININE 0.96 0.81 0.90 0.95  CALCIUM 9.3 9.5 9.3 9.3  MG  --   --  1.9  --    GFR: Estimated Creatinine Clearance: 40.5 mL/min (by C-G formula based on SCr of 0.95 mg/dL). Liver Function Tests: Recent Labs  Lab 05/12/19 0951 05/13/19 2058  AST 20 24  ALT 14 12  ALKPHOS 66 71  BILITOT 1.2 1.7*  PROT 6.7 6.6  ALBUMIN 4.0 4.0   No results for input(s): LIPASE, AMYLASE in the last 168 hours. No results for input(s): AMMONIA in the last 168 hours. Coagulation Profile: No results for input(s): INR, PROTIME in the last 168 hours. Cardiac Enzymes: No results for input(s): CKTOTAL, CKMB, CKMBINDEX, TROPONINI in the last 168 hours. BNP (last 3 results) No results for input(s): PROBNP in the last 8760 hours. HbA1C: No results for input(s): HGBA1C in the last 72 hours. CBG: Recent Labs  Lab 05/14/19 0829 05/14/19 0858  GLUCAP 50* 138*   Lipid Profile: No results for input(s): CHOL, HDL, LDLCALC, TRIG, CHOLHDL, LDLDIRECT in the last 72 hours. Thyroid Function Tests: No results for input(s): TSH, T4TOTAL, FREET4, T3FREE, THYROIDAB in the last 72 hours. Anemia  Panel: No results for input(s): VITAMINB12, FOLATE, FERRITIN, TIBC, IRON, RETICCTPCT in the last 72 hours. Sepsis Labs: No results for input(s): PROCALCITON, LATICACIDVEN in the last 168 hours.  Recent Results (from the past 240 hour(s))  SARS CORONAVIRUS 2 (TAT 6-24 HRS) Nasopharyngeal Nasopharyngeal Swab     Status: None   Collection Time: 05/13/19 10:17 PM   Specimen: Nasopharyngeal Swab  Result Value Ref Range Status   SARS Coronavirus 2 NEGATIVE NEGATIVE Final    Comment: (NOTE) SARS-CoV-2 target nucleic acids are NOT DETECTED. The SARS-CoV-2 RNA is generally detectable in upper and lower respiratory specimens during the acute phase of infection. Negative results do not preclude SARS-CoV-2 infection, do not rule out co-infections with other pathogens, and should not be used as the sole basis for treatment or other patient management decisions. Negative results must be combined with clinical observations, patient history, and epidemiological information. The expected result is Negative. Fact Sheet for Patients: SugarRoll.be Fact Sheet for Healthcare Providers: https://www.woods-mathews.com/ This test is not yet approved or cleared by the [place] FDA and  has been authorized for detection and/or diagnosis of SARS-CoV-2 by FDA under an Emergency Use Authorization (EUA). This EUA will remain  in effect (meaning this test can be used) for the duration of the COVID-19 declaration under Section 56 4(b)(1) of the Act, 21 U.S.C. section 360bbb-3(b)(1), unless the authorization is terminated or revoked sooner. Performed at [facility], [place] [address] [place], [place] [address]      Radiology Studies: DG Chest 1 View  Result Date: 05/13/2019 CLINICAL DATA:  Shortness of breath. 4+ pitting edema. EXAM: CHEST  1 VIEW COMPARISON:  Two-view chest x-ray 03/01/2019 FINDINGS: The heart is enlarged. Atherosclerotic changes are noted at  the aortic arch. Patchy interstitial pattern has increased slightly. Small effusions are suspected. Loop recorder is in place. IMPRESSION: 1. Cardiomegaly with increasing interstitial edema and small effusions compatible with congestive heart failure. 2. Aortic atherosclerosis. Electronically Signed   By: San Morelle M.D.   On: 05/13/2019 21:03   DG UGI W DOUBLE CM (HD BA)  Result Date: 05/15/2019 CLINICAL DATA:  Indigestion. EXAM: UPPER GI SERIES WITHOUT KUB TECHNIQUE: Routine upper GI series was performed with thin density barium. FLUOROSCOPY TIME:  Fluoroscopy Time:  1 minutes 6 seconds Radiation Exposure Index (if provided by the fluoroscopic device): 4.0 mGy Number of Acquired Spot Images: 3 COMPARISON:  Chest x-ray 05/13/2019, 03/01/2019, 08/06/2015. FINDINGS: Patient could only take 1-2 small sips of barium and could not drink anymore due to choking sensation. Upper most portion of the esophagus appears patent and is nondilated. Lower esophagus and stomach could not be evaluated. Follow-up chest x-ray and KUB can be obtained to demonstrate passage of what little bit of barium the patient was able to swallow. Chronic interstitial pulmonary disease noted. IMPRESSION: Very limited and incomplete exam as above. Electronically Signed   By: Marcello Moores  Register   On: 05/15/2019 09:45    Scheduled Meds: . aspirin  324 mg Oral Once  . atorvastatin  10 mg Oral q1800  . clopidogrel  75 mg Oral Daily  . enoxaparin (LOVENOX) injection  40 mg Subcutaneous Q24H  . furosemide  40 mg Intravenous Daily  . hydroxyurea  500 mg Oral QODAY  . lisinopril  40 mg Oral QHS  . pantoprazole (PROTONIX) IV  40 mg Intravenous Q12H  . cyanocobalamin  1,000 mcg Oral Daily   Continuous Infusions:    LOS: 2 days   Time spent: 35 minutes.  Lorella Nimrod, MD [facility]  If 7PM-7AM, please contact night-coverage [email or web address]  05/15/2019, 3:38 PM   This record has been created using Therapist, sports. Errors have been sought and corrected,but may not always be located. Such creation errors do not reflect on the standard of care.

## 2019-05-15 NOTE — Consult Note (Addendum)
[facility] Nurse Consult Note: Reason for Consult:pressure injuries;skin tear Wound type: 1. Stage 2 Pressure Injury; apex of the gluteal fold 2. Partial thickness skin tear; right forearm Wife at bedside reports care to both at home Pressure Injury POA: Yes Measurement: 1. Apex of the gluteal fold; 1.5cm x 1.0cm x 0.1cm 2. Right forearm; 1.0cm x1.5cmx 0.1cm  Wound bed: 1. Yellow with epithelial buds, clean 2. Pink; moist  Drainage (amount, consistency, odor) minimal, no odor Periwound: intact  Dressing procedure/placement/frequency: Silicone foam to apex of the gluteal fold ulcer and to the right forearm. Change every 3 hours and PRN soilage.   Discussed POC with patient and bedside nurse.  Re consult if needed, will not follow at this time. Thanks  Fredi Hurtado [employer], RN,CWOCN, CNS, [place] [phone])

## 2019-05-15 NOTE — Telephone Encounter (Signed)
If pt's wife call back about appt for Thurs with [facility]. Please tell her to call [phone] to reschedule that appointment.

## 2019-05-16 ENCOUNTER — Encounter
Admission: EM | Disposition: A | Discharge status: Hospice | Payer: Self-pay | Source: Home / Self Care | Attending: Hospitalist

## 2019-05-16 ENCOUNTER — Inpatient Hospital Stay: Payer: No Typology Code available for payment source | Admitting: Anesthesiology

## 2019-05-16 ENCOUNTER — Other Ambulatory Visit: Payer: Self-pay

## 2019-05-16 ENCOUNTER — Encounter: Payer: Self-pay | Admitting: Family Medicine

## 2019-05-16 DIAGNOSIS — Z515 Encounter for palliative care: Secondary | ICD-10-CM

## 2019-05-16 DIAGNOSIS — Z7189 Other specified counseling: Secondary | ICD-10-CM

## 2019-05-16 DIAGNOSIS — Q394 Esophageal web: Secondary | ICD-10-CM

## 2019-05-16 DIAGNOSIS — E43 Unspecified severe protein-calorie malnutrition: Secondary | ICD-10-CM | POA: Insufficient documentation

## 2019-05-16 DIAGNOSIS — I5033 Acute on chronic diastolic (congestive) heart failure: Secondary | ICD-10-CM

## 2019-05-16 HISTORY — PX: ESOPHAGOGASTRODUODENOSCOPY: SHX5428

## 2019-05-16 LAB — BASIC METABOLIC PANEL
Anion gap: 22 — ABNORMAL HIGH (ref 5–15)
BUN: 26 mg/dL — ABNORMAL HIGH (ref 8–23)
CO2: 32 mmol/L (ref 22–32)
Calcium: 9.3 mg/dL (ref 8.9–10.3)
Chloride: 89 mmol/L — ABNORMAL LOW (ref 98–111)
Creatinine, Ser: 1.28 mg/dL — ABNORMAL HIGH (ref 0.61–1.24)
GFR calc Af Amer: 56 mL/min — ABNORMAL LOW (ref >60)
GFR calc non Af Amer: 49 mL/min — ABNORMAL LOW (ref >60)
Glucose, Bld: 79 mg/dL (ref 70–99)
Potassium: 3.5 mmol/L (ref 3.5–5.1)
Sodium: 143 mmol/L (ref 135–145)

## 2019-05-16 SURGERY — EGD (ESOPHAGOGASTRODUODENOSCOPY)
Anesthesia: General

## 2019-05-16 MED ORDER — PROPOFOL 10 MG/ML IV BOLUS
INTRAVENOUS | Status: DC | PRN
  Administered 2019-05-16: 50 mg via INTRAVENOUS

## 2019-05-16 MED ORDER — FENTANYL CITRATE (PF) 100 MCG/2ML IJ SOLN: 25.0000 ug | INTRAMUSCULAR | Status: DC | PRN

## 2019-05-16 MED ORDER — PHENYLEPHRINE HCL (PRESSORS) 10 MG/ML IV SOLN
INTRAVENOUS | Status: DC | PRN
  Administered 2019-05-16 (×2): 100 ug via INTRAVENOUS
  Administered 2019-05-16: 200 ug via INTRAVENOUS

## 2019-05-16 MED ORDER — LACTATED RINGERS IV SOLN: INTRAVENOUS | Status: DC | PRN

## 2019-05-16 MED ORDER — IPRATROPIUM-ALBUTEROL 0.5-2.5 (3) MG/3ML IN SOLN
RESPIRATORY_TRACT | Status: AC
  Filled 2019-05-16: qty 3

## 2019-05-16 MED ORDER — PHENYLEPHRINE HCL (PRESSORS) 10 MG/ML IV SOLN
INTRAVENOUS | Status: AC
  Filled 2019-05-16: qty 1

## 2019-05-16 MED ORDER — IPRATROPIUM-ALBUTEROL 0.5-2.5 (3) MG/3ML IN SOLN
3.0000 mL | Freq: Once | RESPIRATORY_TRACT | Status: AC
  Administered 2019-05-16: 3 mL via RESPIRATORY_TRACT

## 2019-05-16 MED ORDER — DEXAMETHASONE SODIUM PHOSPHATE 10 MG/ML IJ SOLN
INTRAMUSCULAR | Status: DC | PRN
  Administered 2019-05-16: 10 mg via INTRAVENOUS

## 2019-05-16 MED ORDER — DEXAMETHASONE SODIUM PHOSPHATE 4 MG/ML IJ SOLN
INTRAMUSCULAR | Status: AC
  Filled 2019-05-16: qty 1

## 2019-05-16 MED ORDER — PROPOFOL 10 MG/ML IV BOLUS
INTRAVENOUS | Status: AC
  Filled 2019-05-16: qty 20

## 2019-05-16 MED ORDER — ONDANSETRON HCL 4 MG/2ML IJ SOLN
INTRAMUSCULAR | Status: AC
  Filled 2019-05-16: qty 2

## 2019-05-16 MED ORDER — LIDOCAINE HCL (CARDIAC) PF 100 MG/5ML IV SOSY
PREFILLED_SYRINGE | INTRAVENOUS | Status: DC | PRN
  Administered 2019-05-16: 50 mg via INTRAVENOUS

## 2019-05-16 MED ORDER — ONDANSETRON HCL 4 MG/2ML IJ SOLN
INTRAMUSCULAR | Status: DC | PRN
  Administered 2019-05-16: 4 mg via INTRAVENOUS

## 2019-05-16 MED ORDER — SODIUM CHLORIDE 0.9 % IV SOLN: INTRAVENOUS | Status: DC

## 2019-05-16 MED ORDER — SUCCINYLCHOLINE CHLORIDE 20 MG/ML IJ SOLN
INTRAMUSCULAR | Status: DC | PRN
  Administered 2019-05-16: 100 mg via INTRAVENOUS

## 2019-05-16 NOTE — Anesthesia Procedure Notes (Addendum)
Procedure Name: Intubation Date/Time: 05/16/2019 12:22 PM Performed by: Justus Memory, CRNA Pre-anesthesia Checklist: Patient identified, Patient being monitored, Timeout performed, Emergency Drugs available and Suction available Patient Re-evaluated:Patient Re-evaluated prior to induction Oxygen Delivery Method: Circle system utilized Preoxygenation: Pre-oxygenation with 100% oxygen Induction Type: IV induction Laryngoscope Size: 3 and McGraph Grade View: Grade I Tube type: Oral Tube size: 7.0 mm Number of attempts: 1 Airway Equipment and Method: Stylet and Video-laryngoscopy Placement Confirmation: ETT inserted through vocal cords under direct vision,  positive ETCO2 and breath sounds checked- equal and bilateral Secured at: 21 cm Tube secured with: Tape Dental Injury: Teeth and Oropharynx as per pre-operative assessment  Difficulty Due To: Difficulty was anticipated, Difficult Airway- due to reduced neck mobility, Difficult Airway- due to anterior larynx and Difficult Airway- due to limited oral opening Future Recommendations: Recommend- induction with short-acting agent, and alternative techniques readily available

## 2019-05-16 NOTE — Anesthesia Postprocedure Evaluation (Signed)
Anesthesia Post Note  Patient: Jimmy Jimenez  Procedure(s) Performed: ESOPHAGOGASTRODUODENOSCOPY (EGD) (N/A )  Patient location during evaluation: PACU Anesthesia Type: General Level of consciousness: awake and alert Pain management: pain level controlled Vital Signs Assessment: post-procedure vital signs reviewed and stable Respiratory status: spontaneous breathing, nonlabored ventilation, respiratory function stable and patient connected to nasal cannula oxygen Cardiovascular status: blood pressure returned to baseline and stable Postop Assessment: no apparent nausea or vomiting Anesthetic complications: no     Last Vitals:  Vitals:   05/16/19 1350 05/16/19 1400  BP: (!) 135/58 (!) 117/54  Pulse: 95 92  Resp: (!) 29 (!) 21  Temp:    SpO2: 94% 93%    Last Pain:  Vitals:   05/16/19 1400  TempSrc:   PainSc: 0-No pain                 Precious Haws Loula Marcella

## 2019-05-16 NOTE — Consult Note (Signed)
Consultation Note Date: 05/16/2019   Patient Name: Jimmy Jimenez  DOB: Jun 30, 1925  MRN: [id number]  Age / Sex: 86 y.o., male  PCP: Crecencio Mc, MD Referring Physician: Enzo Bi, MD  Reason for Consultation: Establishing goals of care  HPI/Patient Profile:  Jimmy Jimenez is a 86 y.o. 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 She states they have been married 27 years and have 1 son. They live at home together. She states she helps him with bathing, but he does other ADL's and uses a walker.  He does not use O2.   She states he has difficulty with swallowing and currently cannot swallow oatmeal or beans. She states she believes he would want a feeding tube if needed. She states he has stated he would not want a ventilator but would want CPR. She inquires about hospice facility as well as SNF.   Plans to remeet tomorrow when patient is alert and oriented, and able to participate in [place] discussion.     SUMMARY OF RECOMMENDATIONS   Will return for [place] discussion tomorrow when patient is A&O as he has just returned from a procedure.       Primary Diagnoses: Present on Admission: . Essential thrombocytosis ([place]) . Essential hypertension . Mass of pancreas   I have reviewed the medical record, interviewed the patient and family, and examined the patient. The following aspects are pertinent.  Past Medical History:  Diagnosis Date  . Colon polyps   . Essential thrombocytosis ([place])   . Essential thrombocytosis ([place])   . Hyperlipidemia    . Hypertension   . Lung nodule 12/2004   found on CXR  . Osteoporosis    Social History   Socioeconomic History  . Marital status: Married    Spouse name: Not on file  . Number of children: Not on file  . Years of education: Not on file  . Highest education level: Not on file  Occupational History  . Occupation: Retired    Comment: former Nature conservation officer, [employer]  Tobacco Use  . Smoking status: Never Smoker  . Smokeless tobacco: Never Used  Substance and Sexual Activity  . Alcohol use: No  . Drug use: No  . Sexual activity: Not Currently  Other Topics Concern  . Not on file  Social History Narrative  . Not on file   Social Determinants of Health   Financial Resource Strain: Low Risk   . Difficulty of Paying Living Expenses: Not hard at all  Food Insecurity: No Food Insecurity  . Worried About Charity fundraiser in the Last Year: Never true  . Ran Out of Food in the Last Year: Never true  Transportation Needs: No Transportation Needs  . Lack  of Transportation (Medical): No  . Lack of Transportation (Non-Medical): No  Physical Activity:   . Days of Exercise per Week:   . Minutes of Exercise per Session:   Stress: No Stress Concern Present  . Feeling of Stress : Not at all  Social Connections: Not Isolated  . Frequency of Communication with Friends and Family: More than three times a week  . Frequency of Social Gatherings with Friends and Family: More than three times a week  . Attends Religious Services: 1 to 4 times per year  . Active Member of Clubs or Organizations: Yes  . Attends Archivist Meetings: 1 to 4 times per year  . Marital Status: Married   Family History  Problem Relation Age of Onset  . Colon cancer Mother        colon age 33's  . Peripheral vascular disease Father   . Colon cancer Brother        colon age 66's  . Diabetes Other   . Prostate cancer Neg Hx   . Bladder Cancer Neg Hx   . Kidney cancer Neg Hx    Scheduled Meds: . aspirin   324 mg Oral Once  . atorvastatin  10 mg Oral q1800  . clopidogrel  75 mg Oral Daily  . enoxaparin (LOVENOX) injection  40 mg Subcutaneous Q24H  . feeding supplement (ENSURE ENLIVE)  237 mL Oral TID BM  . hydroxyurea  500 mg Oral QODAY  . [START ON 05/17/2019] multivitamin  15 mL Per Tube Daily  . pantoprazole (PROTONIX) IV  40 mg Intravenous Q12H  . cyanocobalamin  1,000 mcg Oral Daily   Continuous Infusions: PRN Meds:. Medications Prior to Admission:  Prior to Admission medications   Medication Sig Start Date End Date Taking? Authorizing Provider  Ascorbic Acid (VITAMIN C) 1000 MG tablet Take 1,000 mg by mouth daily.   Yes [provider]  atorvastatin (LIPITOR) 10 MG tablet Take 1 tablet (10 mg total) by mouth daily at 6 PM. 03/08/19  Yes Biby, Massie Kluver, NP  azelastine (ASTELIN) 0.1 % nasal spray Place 2 sprays into both nostrils 2 (two) times daily.  02/10/19  Yes [provider]  benzonatate (TESSALON) 200 MG capsule Take 1 capsule (200 mg total) by mouth 2 (two) times daily as needed for cough. 01/04/19  Yes Crecencio Mc, MD  Cholecalciferol (VITAMIN D3) 25 MCG (1000 UT) CAPS Take 1,000 Units by mouth daily. Take 2 by mouth daily    Yes [provider]  clopidogrel (PLAVIX) 75 MG tablet Take 1 tablet (75 mg total) by mouth daily. 03/09/19  Yes Donzetta Starch, NP  clotrimazole-betamethasone (LOTRISONE) cream Apply topically 2 (two) times daily. 11/28/18  Yes Crecencio Mc, MD  cyanocobalamin 1000 MCG tablet Take 1,000 mcg by mouth daily.    Yes [provider]  docusate sodium (COLACE) 100 MG capsule Take 100 mg by mouth 2 (two) times daily as needed for mild constipation.   Yes [provider]  esomeprazole (NEXIUM) 40 MG capsule TAKE 1 CAPSULE BY MOUTH ONCE DAILY AT NOON 11/27/18  Yes Crecencio Mc, MD  feeding supplement, ENSURE ENLIVE, (ENSURE ENLIVE) LIQD Take 237 mLs by mouth 2 (two) times daily between meals. 03/08/19  Yes Donzetta Starch,  NP  furosemide (LASIX) 20 MG tablet Take 1 tablet (20 mg total) by mouth daily. 03/22/19  Yes Crecencio Mc, MD  hydroxyurea (HYDREA) 500 MG capsule TAKE 1 CAPSULE BY MOUTH EVERY OTHER  DAY MAY TAKE WITH FOOD TO MINIMIZE GI SIDE EFFECTS. 08/08/18  Yes Cammie Sickle, MD  [place] CREA Apply 1 application topically as needed (after each bowel movement for barrier for  pressure ulcer.). 02/15/19  Yes Crecencio Mc, MD  lisinopril (ZESTRIL) 40 MG tablet Take 1 tablet (40 mg total) by mouth at bedtime. 03/22/19  Yes Crecencio Mc, MD  testosterone cypionate (DEPOTESTOSTERONE CYPIONATE) 200 MG/ML injection INJECT 0.25 ML INTRAMUSCULARLY EVERY 14 DAYS AS DIRECTED 02/16/19  Yes Crecencio Mc, MD   Allergies  Allergen Reactions  . Amlodipine     swelling  . Atenolol     swelling  . Erythromycin     Other reaction(s): Hallucination  . Furosemide     Dizziness   . Hctz [Hydrochlorothiazide]     hyponatremia  . Hydrochlorothiazide W-Triamterene     Other reaction(s): Other (See Comments) Patient stated is causes low sodium Patient stated is causes low sodium Patient stated is causes low sodium  Patient stated is causes low sodium   Patient stated is causes low sodium Patient stated is causes low sodium  . Levofloxacin     Causes sleepiness  . Metoprolol     dizziness  . Micardis [Telmisartan]     Causes swelling  . Penicillins Itching    Has patient had a PCN reaction causing immediate rash, facial/tongue/throat swelling, SOB or lightheadedness with hypotension: No Has patient had a PCN reaction causing severe rash involving mucus membranes or skin necrosis: No Has patient had a PCN reaction that required hospitalization No Has patient had a PCN reaction occurring within the last 10 years: No If all of the above answers are "NO", then may proceed with Cephalosporin use.  . Terazosin     Causes swelling in legs and feet  . Triamterene-Hctz      Patient stated is causes low sodium   Review of Systems  Unable to perform ROS   Physical Exam Constitutional:      Comments: Resting with eyes closed.      Vital Signs: BP (!) 117/54   Pulse 92   Temp 97.8 F (36.6 C)   Resp (!) 21   Ht 5\' 8"  (1.727 m)   Wt 57.3 kg   SpO2 93%   BMI 19.21 kg/m  Pain Scale: Faces   Pain Score: 0-No pain   SpO2: SpO2: 93 % O2 Device:SpO2: 93 % O2 Flow Rate: .O2 Flow Rate (L/min): 2 L/min  IO: Intake/output summary:   Intake/Output Summary (Last 24 hours) at 05/16/2019 1541 Last data filed at 05/16/2019 1353 Gross per 24 hour  Intake 400 ml  Output 700 ml  Net -300 ml    LBM: Last BM Date: 05/15/19 Baseline Weight: Weight: 68 kg Most recent weight: Weight: 57.3 kg     Palliative Assessment/Data:     Time In: 3:15 Time Out:3:45 Time Total: 30 min Greater than 50%  of this time was spent counseling and coordinating care related to the above assessment and plan.  Signed by: Asencion Gowda, NP   Please contact Palliative Medicine Team phone at [phone] for questions and concerns.  For individual provider: See Shea Evans

## 2019-05-16 NOTE — Progress Notes (Signed)
Duo neb given for resp rate 35 and sat on face mask 88

## 2019-05-16 NOTE — Op Note (Signed)
[facility] Patient Name: Jimmy Jimenez Procedure Date: 05/16/2019 12:02 PM MRN: [id number] Account #: [id number] Date of Birth: 05/10/1925 Admit Type: Outpatient Age: 86 Room: [facility] ENDO ROOM 4 Gender: Male Note Status: Finalized Procedure:             Upper GI endoscopy Indications:           Dysphagia Providers:             Lin Landsman MD, MD Referring MD:          Deborra Medina, MD (Referring MD) Medicines:             General Anesthesia Complications:         No immediate complications. Estimated blood loss: None. Procedure:             Pre-Anesthesia Assessment:                        - Prior to the procedure, a History and Physical was                         performed, and patient medications and allergies were                         reviewed. The patient is competent. The risks and                         benefits of the procedure and the sedation options and                         risks were discussed with the patient. All questions                         were answered and informed consent was obtained.                         Patient identification and proposed procedure were                         verified by the physician, the nurse, the                         anesthesiologist, the anesthetist and the technician                         in the pre-procedure area in the procedure room in the                         endoscopy suite. Mental Status Examination: alert and                         oriented. Airway Examination: normal oropharyngeal                         airway and neck mobility. Respiratory Examination:                         clear to auscultation. CV Examination: normal.  Prophylactic Antibiotics: The patient does not require                         prophylactic antibiotics. Prior Anticoagulants: The                         patient has taken no previous anticoagulant or                          antiplatelet agents. ASA Grade Assessment: III - A                         patient with severe systemic disease. After reviewing                         the risks and benefits, the patient was deemed in                         satisfactory condition to undergo the procedure. The                         anesthesia plan was to use monitored anesthesia care                         (MAC). Immediately prior to administration of                         medications, the patient was re-assessed for adequacy                         to receive sedatives. The heart rate, respiratory                         rate, oxygen saturations, blood pressure, adequacy of                         pulmonary ventilation, and response to care were                         monitored throughout the procedure. The physical                         status of the patient was re-assessed after the                         procedure.                        After obtaining informed consent, the endoscope was                         passed under direct vision. Throughout the procedure,                         the patient's blood pressure, pulse, and oxygen                         saturations were monitored continuously. The Endoscope  was introduced through the mouth, and advanced to the                         second part of duodenum. The upper GI endoscopy was                         accomplished without difficulty. The patient tolerated                         the procedure well. Findings:      The duodenal bulb and second portion of the duodenum were normal.      The entire examined stomach was normal.      The cardia and gastric fundus were normal on retroflexion.      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 This does not explain choking sensation to liquids. A TTS       dilator was passed through the scope. Dilation with a 12-13.5-15 mm       balloon  dilator was performed to 15 mm. The dilation site was examined       following endoscope reinsertion and showed mild mucosal disruption.       Estimated blood loss: none. Impression:            - Normal duodenal bulb and second portion of the                         duodenum.                        - Normal stomach.                        - Web in the upper third of the esophagus. Dilated.                        - No specimens collected. Recommendation:        - Return patient to hospital ward for ongoing care.                        - Advance diet as tolerated today.                        - Continue present medications.                        - Perform routine esophageal manometry at [facility] at                         appointment to be scheduled.                        - Recommend speech path study to evaluate for                         oropharyngeal dysphagia as the presence of web with                         minimal esophageal narrowing does not explain choking  episodes to liquids or pills or soft food Procedure Code(s):     --- Professional ---                        [phone], Esophagogastroduodenoscopy, flexible,                         transoral; with transendoscopic balloon dilation of                         esophagus (less than 30 mm diameter) Diagnosis Code(s):     --- Professional ---                        Q39.4, Esophageal web                        R13.10, Dysphagia, unspecified CPT copyright 2019 American Medical Association. All rights reserved. The codes documented in this report are preliminary and upon coder review may  be revised to meet current compliance requirements. Dr. Ulyess Mort Lin Landsman MD, MD 05/16/2019 12:46:40 PM This report has been signed electronically. Number of Addenda: 0 Note Initiated On: 05/16/2019 12:02 PM Estimated Blood Loss:  Estimated blood loss: none.      [facility]

## 2019-05-16 NOTE — Anesthesia Preprocedure Evaluation (Addendum)
Anesthesia Evaluation  Patient identified by MRN, date of birth, ID bandGeneral Assessment Comment:Pt awake and alert but aphasic.  Reviewed: Allergy & Precautions, H&P , NPO status   History of Anesthesia Complications Negative for: history of anesthetic complications  Airway Mallampati: II  TM Distance: <3 FB Neck ROM: limited  Mouth opening: Limited Mouth Opening Comment: Unable to cooperate with exam Dental  (+) Dental Advidsory Given, Poor Dentition   Pulmonary shortness of breath, neg COPD, neg recent URI,  Pulmonary fibrosis   breath sounds clear to auscultation       Cardiovascular Exercise Tolerance: Poor hypertension, (-) angina+CHF  (-) Past MI and (-) Cardiac Stents (-) dysrhythmias  Rhythm:regular Rate:Normal     Neuro/Psych neg Seizures CODE STROKE CVA, Residual Symptoms    GI/Hepatic Neg liver ROS, GERD  ,  Endo/Other  negative endocrine ROS  Renal/GU CRFRenal disease     Musculoskeletal   Abdominal   Peds  Hematology thrombocytosis   Anesthesia Other Findings Past Medical History: No date: Colon polyps No date: Essential thrombocytosis ([facility]) No date: Essential thrombocytosis ([place]) No date: Hyperlipidemia No date: Hypertension 12/2004: Lung nodule     Comment:  found on CXR No date: Osteoporosis   Reproductive/Obstetrics                             Anesthesia Physical  Anesthesia Plan  ASA: III  Anesthesia Plan: General   Post-op Pain Management:    Induction: Intravenous  PONV Risk Score and Plan: 2 and Dexamethasone, Treatment may vary due to age or medical condition and Ondansetron  Airway Management Planned: Oral ETT  Additional Equipment:   Intra-op Plan:   Post-operative Plan: Extubation in OR and Possible Post-op intubation/ventilation  Informed Consent: I have reviewed the patients History and Physical, chart, labs and discussed the procedure  including the risks, benefits and alternatives for the proposed anesthesia with the patient or authorized representative who has indicated his/her understanding and acceptance.   Patient has DNR.  Discussed DNR with patient and Suspend DNR.   Dental Advisory Given  Plan Discussed with: CRNA, Anesthesiologist and Surgeon  Anesthesia Plan Comments: (Patient consented for risks of anesthesia including but not limited to:  - adverse reactions to medications - damage to teeth, lips or other oral mucosa - sore throat or hoarseness - Damage to heart, brain, lungs or loss of life  Patient voiced understanding.)       Anesthesia Quick Evaluation

## 2019-05-16 NOTE — Progress Notes (Signed)
SLP Cancellation Note  Patient Details Name: Jimmy Jimenez MRN: [id number] DOB: 1925/03/05   Cancelled treatment:       Reason Eval/Treat Not Completed: Patient at procedure or test/unavailable(chart reviewed). Pt is NPO currently for procedure today - upper endoscopy w/ GI. Pt was unable to take sufficient barium during the DG Esophagus exam.  ST services will f/u later today/tomorrow post results of Endoscopy, GI notes. Recommend oral care for hygiene and stimulation of swallowing.     Orinda Kenner, [place], CCC-SLP Amori Colomb 05/16/2019, 8:28 AM

## 2019-05-16 NOTE — Progress Notes (Signed)
PROGRESS NOTE    Jimmy Jimenez  [id number] DOB: 02-Jun-1925 DOA: 05/13/2019 PCP: Crecencio Mc, MD   Brief Narrative:  Jimmy Jimenez is a 86 y.o. 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  He also has had issues with hypersalivation for years.  Patient became more concerned as he has been unable to eat food or keep down his medications.  She was seen in a day prior to admission in ED for similar symptoms, negative CT head and was discharged home to follow-up with his GI. He was hypoxic on presentation requiring 2 L, potassium of 2.9 with lower extremity edema and chest x-ray with interstitial edema.  Admitted for CHF exacerbation.  Subjective: Pt underwent EGD today.  After the procedure, pt complained of no pain, no dyspnea.  No fever, chest pain, abdominal pain, N/V/D, dysuria.  Still can't swallow.   Assessment & Plan:   Principal Problem:   Acute on chronic congestive heart failure (HCC) Active Problems:   Essential thrombocytosis (HCC)   Essential hypertension   Mass of pancreas   History of CVA (cerebrovascular accident)   Hypokalemia   Dysphagia   Choking sensation   Protein-calorie malnutrition, severe  Acute hypoxic respiratory failure in the setting of acute on chronic diastolic heart failure and history of pulmonary fibrosis.  Patient was unable to take his medications for the past few days due to difficulty swallowing. Saturation improves with diuresis. -d/c IV Lasix 40 mg daily 2/2 Cr increase -Continue daily weight and strict intake and output.  Dysphagia  Swallow evaluation was done, low risk for  aspiration but they are recommending GI consult. He has incomplete study for esophageal gram today as he was unable to drink whole contrast. -Going for EGD today. --Palliative consult  Pancreatic cyst/intraductal papillary neoplasm. Patient apparently has an history of pancreatic head cystic mass which is growing in size and CA 19-9 was elevating.  Being investigated by [facility] oncology.  He was planned for EUS got delayed due to his CVA in January.  According to their note patient was interested in surgery if diagnosed with cancer. EUS is normally not done here and he needs to keep his appointment for Thursday.  Elevated troponin.  Most likely secondary to demand, started trending down.  Hypokalemia.  Resolved. -Monitor electrolytes replete as needed.  History of CVA with right hemiparesis Continue Plavix  Essential thrombocytosis Stable Continue hydroxyurea.  Hypertension Hold home lisinopril 2/2 Cr bump   DVT prophylaxis: Lovenox Code Status: Partial/DNI Family Communication: wife udpated at bedside Disposition Plan: Pending feeding solutions    Objective: Vitals:   05/16/19 1340 05/16/19 1348 05/16/19 1350 05/16/19 1400  BP: (!) 135/59  (!) 135/58 (!) 117/54  Pulse: 96 96 95 92  Resp: (!) 34 (!) 30 (!) 29 (!) 21  Temp:  97.8 F (36.6 C)    TempSrc:      SpO2: 94% 95% 94% 93%  Weight:      Height:        Intake/Output Summary (Last 24 hours) at 05/16/2019 1611 Last data filed at 05/16/2019 1353 Gross per 24 hour  Intake 400 ml  Output 700 ml  Net -300  ml   Filed Weights   05/15/19 0514 05/16/19 0446 05/16/19 1201  Weight: 56.6 kg 57.3 kg 57.3 kg    Examination:  Constitutional: NAD, AAOx3 HEENT: conjunctivae and lids normal, EOMI CV: RRR no M,R,G. Distal pulses +2.  No cyanosis.   RESP: CTA B/L, normal respiratory effort, on 2L GI: +BS, NTND Extremities: No effusions, edema, or tenderness in BLE SKIN: warm, dry and intact Neuro: II - XII  grossly intact.  Sensation intact Psych: Depressed mood and affect.     Consultants:   GI  Procedures:  Antimicrobials:   Data Reviewed: I have personally reviewed following labs and imaging studies  CBC: Recent Labs  Lab 05/12/19 0951 05/13/19 2058 05/14/19 0714 05/15/19 0457  WBC 9.0 10.3 10.3 9.6  NEUTROABS  --  7.6  --   --   HGB 14.3 13.9 13.9 13.6  HCT 43.6 42.1 42.5 41.6  MCV 98.6 96.8 97.5 99.3  PLT 434* 416* 436* [id number]*   Basic Metabolic Panel: Recent Labs  Lab 05/12/19 0951 05/13/19 2058 05/14/19 0714 05/15/19 0457 05/16/19 0520  NA 138 135 138 140 143  K 2.8* 2.9* 2.8* 3.5 3.5  CL 87* 86* 85* 88* 89*  CO2 40* 37* 35* 35* 32  GLUCOSE 111* 82 58* 60* 79  BUN 17 13 14 18  26*  CREATININE 0.96 0.81 0.90 0.95 1.28*  CALCIUM 9.3 9.5 9.3 9.3 9.3  MG  --   --  1.9  --   --    GFR: Estimated Creatinine Clearance: 30.5 mL/min (A) (by C-G formula based on SCr of 1.28 mg/dL (H)). Liver Function Tests: Recent Labs  Lab 05/12/19 0951 05/13/19 2058  AST 20 24  ALT 14 12  ALKPHOS 66 71  BILITOT 1.2 1.7*  PROT 6.7 6.6  ALBUMIN 4.0 4.0   No results for input(s): LIPASE, AMYLASE in the last 168 hours. No results for input(s): AMMONIA in the last 168 hours. Coagulation Profile: No results for input(s): INR, PROTIME in the last 168 hours. Cardiac Enzymes: No results for input(s): CKTOTAL, CKMB, CKMBINDEX, TROPONINI in the last 168 hours. BNP (last 3 results) No results for input(s): PROBNP in the last 8760 hours. HbA1C: No results for input(s): HGBA1C in the last 72 hours. CBG: Recent Labs  Lab 05/14/19 0829 05/14/19 0858  GLUCAP 50* 138*   Lipid Profile: No results for input(s): CHOL, HDL, LDLCALC, TRIG, CHOLHDL, LDLDIRECT in the last 72 hours. Thyroid Function Tests: No results for input(s): TSH, T4TOTAL, FREET4, T3FREE, THYROIDAB in the last 72 hours. Anemia Panel: No results for input(s): VITAMINB12, FOLATE, FERRITIN, TIBC, IRON, RETICCTPCT in  the last 72 hours. Sepsis Labs: No results for input(s): PROCALCITON, LATICACIDVEN in the last 168 hours.  Recent Results (from the past 240 hour(s))  SARS CORONAVIRUS 2 (TAT 6-24 HRS) Nasopharyngeal Nasopharyngeal Swab     Status: None   Collection Time: 05/13/19 10:17 PM   Specimen: Nasopharyngeal Swab  Result Value Ref Range Status   SARS Coronavirus 2 NEGATIVE NEGATIVE Final    Comment: (NOTE) SARS-CoV-2 target nucleic acids are NOT DETECTED. The SARS-CoV-2 RNA is generally detectable in upper and lower respiratory specimens during the acute phase of infection. Negative results do not preclude SARS-CoV-2 infection, do not rule out co-infections with other pathogens, and should not be used as the sole basis for treatment or other patient management decisions. Negative results must be combined with clinical observations, patient history, and epidemiological information. The expected result is Negative. Fact Sheet for Patients:  SugarRoll.be Fact Sheet for Healthcare Providers: https://www.woods-mathews.com/ This test is not yet approved or cleared by the [place] FDA and  has been authorized for detection and/or diagnosis of SARS-CoV-2 by FDA under an Emergency Use Authorization (EUA). This EUA will remain  in effect (meaning this test can be used) for the duration of the COVID-19 declaration under Section 56 4(b)(1) of the Act, 21 U.S.C. section 360bbb-3(b)(1), unless the authorization is terminated or revoked sooner. Performed at [facility], [place] [address] [place], [place] [address]      Radiology Studies: DG UGI W DOUBLE CM (HD BA)  Result Date: 05/15/2019 CLINICAL DATA:  Indigestion. EXAM: UPPER GI SERIES WITHOUT KUB TECHNIQUE: Routine upper GI series was performed with thin density barium. FLUOROSCOPY TIME:  Fluoroscopy Time:  1 minutes 6 seconds Radiation Exposure Index (if provided by the fluoroscopic device): 4.0  mGy Number of Acquired Spot Images: 3 COMPARISON:  Chest x-ray 05/13/2019, 03/01/2019, 08/06/2015. FINDINGS: Patient could only take 1-2 small sips of barium and could not drink anymore due to choking sensation. Upper most portion of the esophagus appears patent and is nondilated. Lower esophagus and stomach could not be evaluated. Follow-up chest x-ray and KUB can be obtained to demonstrate passage of what little bit of barium the patient was able to swallow. Chronic interstitial pulmonary disease noted. IMPRESSION: Very limited and incomplete exam as above. Electronically Signed   By: Marcello Moores  Register   On: 05/15/2019 09:45    Scheduled Meds: . aspirin  324 mg Oral Once  . atorvastatin  10 mg Oral q1800  . clopidogrel  75 mg Oral Daily  . enoxaparin (LOVENOX) injection  40 mg Subcutaneous Q24H  . feeding supplement (ENSURE ENLIVE)  237 mL Oral TID BM  . hydroxyurea  500 mg Oral QODAY  . [START ON 05/17/2019] multivitamin  15 mL Per Tube Daily  . pantoprazole (PROTONIX) IV  40 mg Intravenous Q12H  . cyanocobalamin  1,000 mcg Oral Daily   Continuous Infusions:    LOS: 3 days    Enzo Bi, MD [facility]  If 7PM-7AM, please contact night-coverage [email or web address]  05/16/2019, 4:11 PM

## 2019-05-16 NOTE — Transfer of Care (Signed)
Immediate Anesthesia Transfer of Care Note  Patient: Jimmy Jimenez  Procedure(s) Performed: ESOPHAGOGASTRODUODENOSCOPY (EGD) (N/A )  Patient Location: PACU  Anesthesia Type:General  Level of Consciousness: sedated  Airway & Oxygen Therapy: Patient Spontanous Breathing and Patient connected to face mask oxygen  Post-op Assessment: Report given to RN and Post -op Vital signs reviewed and stable  Post vital signs: Reviewed and stable  Last Vitals:  Vitals Value Taken Time  BP 140/64 05/16/19 1302  Temp    Pulse 97 05/16/19 1309  Resp 32 05/16/19 1309  SpO2 92 % 05/16/19 1309  Vitals shown include unvalidated device data.  Last Pain:  Vitals:   05/16/19 1201  TempSrc: Temporal  PainSc: 0-No pain         Complications: No apparent anesthesia complications

## 2019-05-17 ENCOUNTER — Ambulatory Visit: Payer: No Typology Code available for payment source

## 2019-05-17 ENCOUNTER — Encounter: Payer: Self-pay | Admitting: *Deleted

## 2019-05-17 DIAGNOSIS — Z515 Encounter for palliative care: Secondary | ICD-10-CM

## 2019-05-17 DIAGNOSIS — Z7189 Other specified counseling: Secondary | ICD-10-CM

## 2019-05-17 LAB — BASIC METABOLIC PANEL
Anion gap: 18 — ABNORMAL HIGH (ref 5–15)
BUN: 30 mg/dL — ABNORMAL HIGH (ref 8–23)
CO2: 35 mmol/L — ABNORMAL HIGH (ref 22–32)
Calcium: 9.3 mg/dL (ref 8.9–10.3)
Chloride: 91 mmol/L — ABNORMAL LOW (ref 98–111)
Creatinine, Ser: 1.35 mg/dL — ABNORMAL HIGH (ref 0.61–1.24)
GFR calc Af Amer: 53 mL/min — ABNORMAL LOW (ref >60)
GFR calc non Af Amer: 46 mL/min — ABNORMAL LOW (ref >60)
Glucose, Bld: 135 mg/dL — ABNORMAL HIGH (ref 70–99)
Potassium: 3.9 mmol/L (ref 3.5–5.1)
Sodium: 144 mmol/L (ref 135–145)

## 2019-05-17 LAB — CBC
HCT: 45.4 % (ref 39.0–52.0)
Hemoglobin: 14.4 g/dL (ref 13.0–17.0)
MCH: 32.1 pg (ref 26.0–34.0)
MCHC: 31.7 g/dL (ref 30.0–36.0)
MCV: 101.3 fL — ABNORMAL HIGH (ref 80.0–100.0)
Platelets: 437 10*3/uL — ABNORMAL HIGH (ref 150–400)
RBC: 4.48 MIL/uL (ref 4.22–5.81)
RDW: 13.2 % (ref 11.5–15.5)
WBC: 18.2 10*3/uL — ABNORMAL HIGH (ref 4.0–10.5)
nRBC: 0 % (ref 0.0–0.2)

## 2019-05-17 LAB — PROCALCITONIN: Procalcitonin: 0.11 ng/mL

## 2019-05-17 LAB — MAGNESIUM: Magnesium: 2.1 mg/dL (ref 1.7–2.4)

## 2019-05-17 MED ORDER — ONDANSETRON 4 MG PO TBDP
4.0000 mg | ORAL_TABLET | Freq: Four times a day (QID) | ORAL | Status: DC | PRN
  Filled 2019-05-17: qty 1

## 2019-05-17 MED ORDER — POLYVINYL ALCOHOL 1.4 % OP SOLN
1.0000 [drp] | Freq: Four times a day (QID) | OPHTHALMIC | Status: DC | PRN
  Filled 2019-05-17: qty 15

## 2019-05-17 MED ORDER — HALOPERIDOL LACTATE 2 MG/ML PO CONC
0.5000 mg | ORAL | Status: DC | PRN
  Filled 2019-05-17: qty 0.3

## 2019-05-17 MED ORDER — BIOTENE DRY MOUTH MT LIQD: 15.0000 mL | OROMUCOSAL | Status: DC | PRN

## 2019-05-17 MED ORDER — DEXTROSE-NACL 5-0.45 % IV SOLN: INTRAVENOUS | Status: DC

## 2019-05-17 MED ORDER — GLYCOPYRROLATE 0.2 MG/ML IJ SOLN
0.2000 mg | INTRAMUSCULAR | Status: DC | PRN
  Filled 2019-05-17: qty 1

## 2019-05-17 MED ORDER — HALOPERIDOL LACTATE 5 MG/ML IJ SOLN: 0.5000 mg | INTRAMUSCULAR | Status: DC | PRN

## 2019-05-17 MED ORDER — LORAZEPAM 2 MG/ML PO CONC
1.0000 mg | ORAL | Status: DC | PRN
  Filled 2019-05-17: qty 0.5

## 2019-05-17 MED ORDER — HALOPERIDOL 0.5 MG PO TABS
0.5000 mg | ORAL_TABLET | ORAL | Status: DC | PRN
  Filled 2019-05-17: qty 1

## 2019-05-17 MED ORDER — ACETAMINOPHEN 650 MG RE SUPP: 650.0000 mg | Freq: Four times a day (QID) | RECTAL | Status: DC | PRN

## 2019-05-17 MED ORDER — GLYCOPYRROLATE 1 MG PO TABS
1.0000 mg | ORAL_TABLET | ORAL | Status: DC | PRN
  Filled 2019-05-17: qty 1

## 2019-05-17 MED ORDER — MORPHINE SULFATE (PF) 2 MG/ML IV SOLN: 1.0000 mg | INTRAVENOUS | Status: DC | PRN

## 2019-05-17 MED ORDER — ONDANSETRON HCL 4 MG/2ML IJ SOLN: 4.0000 mg | Freq: Four times a day (QID) | INTRAMUSCULAR | Status: DC | PRN

## 2019-05-17 MED ORDER — ACETAMINOPHEN 325 MG PO TABS: 650.0000 mg | ORAL_TABLET | Freq: Four times a day (QID) | ORAL | Status: DC | PRN

## 2019-05-17 MED ORDER — LORAZEPAM 1 MG PO TABS: 1.0000 mg | ORAL_TABLET | ORAL | Status: DC | PRN

## 2019-05-17 MED ORDER — ENOXAPARIN SODIUM 30 MG/0.3ML ~~LOC~~ SOLN: 30.0000 mg | SUBCUTANEOUS | Status: DC

## 2019-05-17 MED ORDER — LORAZEPAM 2 MG/ML IJ SOLN: 0.5000 mg | INTRAMUSCULAR | Status: DC | PRN

## 2019-05-17 NOTE — Progress Notes (Signed)
PT Cancellation Note  Patient Details Name: Jimmy Jimenez MRN: [id number] DOB: 03-May-1925   Cancelled Treatment:    Reason Eval/Treat Not Completed: Other (comment) Another provider in a room, patient not available. Will re-attempt PT evaluation when patient is available and appropriate.   Trotter,Margaret PT, DPT 05/17/2019, 11:52 AM

## 2019-05-17 NOTE — Progress Notes (Addendum)
While attempting to do assessment on patient this am, palliative was in room speaking with patient and wife. Upon return with medications palliative verbalized patient would be transitioning to comfort measures. Assessed patient, patient laying in bed without any complaints. Will await for new orders.   Update: DNR armband applied. Comfort measures in place.Tele monitor taken off. Patient comfortable in bed, no complaints.

## 2019-05-17 NOTE — Progress Notes (Signed)
OT Cancellation Note  Patient Details Name: Jayro Castner MRN: [id number] DOB: 02-06-1926   Cancelled Treatment:    Reason Eval/Treat Not Completed: Other (comment)  OT consult received and chart reviewed. Upon chart review, noted that pt status changed to comfort measures this date. Will complete OT order and sign off at this time. Thank you.  Gerrianne Scale, [place], OTR/L ascom [phone] 05/17/19, 2:30 PM

## 2019-05-17 NOTE — Progress Notes (Addendum)
Daily Progress Note   Patient Name: Jimmy Jimenez       Date: 05/17/2019 DOB: [birth year]  Age: 86 y.o. MRN#: [id number] Attending Physician: Enzo Bi, MD Primary Care Physician: Crecencio Mc, MD Admit Date: 05/13/2019  Reason for Consultation/Follow-up: Establishing goals of care  Subjective: In to see patient. He states his wife spoke with him yesterday about [place], and he would not want a feeding tube or CPR. He states is tired and does not want any further lifePatient prayed "Lord, I want to spend my time with you father." He asked that I call his wife.  Wife in to bedside as I stepped out. Returned to bedside. He confirmed to her he is tired and wants no further life prolonging care. She inquires about ability to change her mind later after shifting to comfort care and he loudly said "let me go!" He confirmed he was ready to focus on comfort until he leaves this earth which could be in days to weeks.  He prayed in our presence for the Lord to save him (salvation), and the Lord to take him to be with him. His wife then prayed to thank God for saving him and discussed scripture confirming requests of the Lord to be saved.This seems to provide some peace for her. We discussed comfort care measures and she requests him to go to hospice facility.   His wife discusses the initial plan to go to [facility] for investigation of the pancreas mass, and that patient opted to look into swallowing first. We discussed that the ability to eat and drink, or have a form of nutrition is neccessary to sustaining life. She states a doctor advised her that she should have taken him on to [facility]. Supported wife, and discussed that wife is advocating for her husband's wishes.   He initially requested chocolate ice cream  during conversation.  Following conversation inquired if he wanted anything to eat or drink including the ice cream and he said "no".  Discussed diet liberation following EGD, and he again stated "no"!. He declines anything by mouth that he would need to swallow. He may allow sublingual medications.  I completed a MOST form today and the signed original was placed in the chart.  A photocopy was placed in the chart to be scanned into EMR. The patient  outlined their wishes for the following treatment decisions, and wife signed form as patient is too weak to do so:  Cardiopulmonary Resuscitation: Do Not Attempt Resuscitation (DNR/No CPR)  Medical Interventions: Comfort Measures: Keep clean, warm, and dry. Use medication by any route, positioning, wound care, and other measures to relieve pain and suffering. Use oxygen, suction and manual treatment of airway obstruction as needed for comfort. Do not transfer to the hospital unless comfort needs cannot be met in current location.  Antibiotics: No antibiotics (use other measures to relieve symptoms)  IV Fluids: No IV fluids (provide other measures to ensure comfort)  Feeding Tube: No feeding tube    Length of Stay: 4  Current Medications: Scheduled Meds:  . aspirin  324 mg Oral Once  . atorvastatin  10 mg Oral q1800  . clopidogrel  75 mg Oral Daily  . enoxaparin (LOVENOX) injection  40 mg Subcutaneous Q24H  . feeding supplement (ENSURE ENLIVE)  237 mL Oral TID BM  . hydroxyurea  500 mg Oral QODAY  . multivitamin  15 mL Per Tube Daily  . pantoprazole (PROTONIX) IV  40 mg Intravenous Q12H  . cyanocobalamin  1,000 mcg Oral Daily    Continuous Infusions: . dextrose 5 % and 0.45% NaCl      PRN Meds:   Physical Exam Pulmonary:     Effort: Pulmonary effort is normal.  Skin:    General: Skin is warm and dry.  Neurological:     Mental Status: He is alert.             Vital Signs: BP (!) 148/61 (BP Location: Right Arm)   Pulse 91   Temp  (!) 97.4 F (36.3 C) (Axillary)   Resp 20   Ht _0  (1.727 m)   Wt 54.4 kg   SpO2 100%   BMI 18.23 kg/m  SpO2: SpO2: 100 % O2 Device: O2 Device: Nasal Cannula O2 Flow Rate: O2 Flow Rate (L/min): 2 L/min  Intake/output summary:   Intake/Output Summary (Last 24 hours) at 05/17/2019 1006 Last data filed at 05/17/2019 2947 Gross per 24 hour  Intake 400 ml  Output 550 ml  Net -150 ml   LBM: Last BM Date: 05/15/19 Baseline Weight: Weight: 68 kg Most recent weight: Weight: 54.4 kg       Palliative Assessment/Data: 30%      Patient Active Problem List   Diagnosis Date Noted  . Protein-calorie malnutrition, severe 05/16/2019  . Choking sensation   . Acute on chronic congestive heart failure ([place]) 05/13/2019  . History of CVA (cerebrovascular accident) 05/13/2019  . Hypokalemia 05/13/2019  . Dysphagia 05/13/2019  . Hypersalivation 04/13/2019  . IPMN (intraductal papillary mucinous neoplasm) 04/01/2019  . Acute colitis 03/31/2019  . Cerebrovascular accident (CVA) ([place])   . Hematochezia   . Hospital discharge follow-up 03/11/2019  . Pseudoaneurysm ([place]) R groin 03/08/2019  . Leukocytosis 03/08/2019  . Acute blood loss anemia 03/08/2019  . Mild malnutrition ([place]) 03/08/2019  . Pressure injury of skin 03/06/2019  . 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 GERD (gastroesophageal reflux disease) 03/02/2019  . Chronic diastolic CHF (congestive heart failure) ([place]) 03/02/2019  . Elevated troponin 03/02/2019  . Sacral decubitus ulcer, stage II ([place]) 02/19/2019  . Chest pain 09/30/2018  . Rhinitis, chronic 11/20/2017  . Mass of pancreas 04/23/2016  . B12 deficiency 08/17/2015  . Low serum testosterone 05/20/2015  . Chronic constipation 03/31/2015  . Pulmonary fibrosis (  [place]) 12/12/2014  . Abnormal weight loss 08/20/2014  . Medicare annual wellness visit, subsequent 09/28/2013  . Statin intolerance  09/26/2013  . Pulmonary hypertension, mild ([place]) 12/14/2012  . Solitary pulmonary nodule 10/16/2012  . Heart failure, diastolic, due to HTN ([place]) 10/16/2012  . Bradycardia, sinus 09/02/2012  . Hyponatremia 12/01/2011  . Edema of both legs 10/30/2011  . Essential thrombocytosis ([place])   . Hyperlipidemia   . Essential hypertension   . Osteoporosis     Palliative Care Assessment & Plan     Recommendations/Plan:  MOST form completed for comfort. Shifting to comfort care.   Recommend hospice facility.     Code Status:    Code Status Orders  (From admission, onward)         Start     Ordered   05/13/19 2259  Limited resuscitation (code)  Continuous    Question Answer Comment  In the event of cardiac or respiratory ARREST: Initiate Code Blue, Call Rapid Response Yes   In the event of cardiac or respiratory ARREST: Perform CPR Yes   In the event of cardiac or respiratory ARREST: Perform Intubation/Mechanical Ventilation No   In the event of cardiac or respiratory ARREST: Use NIPPV/BiPAp only if indicated Yes   In the event of cardiac or respiratory ARREST: Administer ACLS medications if indicated Yes   In the event of cardiac or respiratory ARREST: Perform Defibrillation or Cardioversion if indicated Yes      05/13/19 2259        Code Status History    Date Active Date Inactive Code Status Order ID Comments User Context   03/31/2019 0459 04/03/2019 0036 Partial Code [id number]  Mansy, Arvella Merles, MD ED   03/04/2019 0007 03/08/2019 2333 Full Code [id number]  Kerney Elbe, MD ED   03/02/2019 1012 03/03/2019 2015 Full Code [id number]  Ivor Costa, MD ED   12/07/2014 1034 12/08/2014 1430 Full Code [id number]  Henreitta Leber, MD Inpatient   Advance Care Planning Activity    Advance Directive Documentation     Most Recent Value  Type of Advance Directive  Healthcare Power of Attorney, Living will  Pre-existing out of facility DNR order (yellow form or pink MOST form)  -  "MOST" Form in Place?   -       Prognosis:   < 2 weeks Patient is not eating or drinking currently, and does not desire to. Hypokalemia. WBC 18.2 today. CVA in January.      Care plan was discussed with primary RN and IM'd MD.   Thank you for allowing the Palliative Medicine Team to assist in the care of this patient.   Time In: 10:00 Time Out: 11:20 Total Time 80 min Prolonged Time Billed  yes      Greater than 50%  of this time was spent counseling and coordinating care related to the above assessment and plan.  Asencion Gowda, NP  Please contact Palliative Medicine Team phone at [phone] for questions and concerns.

## 2019-05-17 NOTE — Progress Notes (Signed)
S/p EGD yesterday with empiric dilation of mild upper esophageal web Patient is still unable to eat Palliative care has been consulted, family decided to proceed with comfort care Empathized with family, patient's son, grandson and wife  Cephas Darby, MD [address]  [place], [place] [address]  Main: [phone]  Fax: [phone] Pager: [phone]

## 2019-05-17 NOTE — Progress Notes (Signed)
PHARMACIST - PHYSICIAN COMMUNICATION  CONCERNING:  Enoxaparin (Lovenox) for DVT Prophylaxis    RECOMMENDATION: Patient was prescribed enoxaparin 40mg  q24 hours for VTE prophylaxis.   Filed Weights   05/16/19 0446 05/16/19 1201 05/17/19 0507  Weight: 126 lb 4.8 oz (57.3 kg) 126 lb 5.2 oz (57.3 kg) 119 lb 14.4 oz (54.4 kg)    Body mass index is 18.23 kg/m.  Estimated Creatinine Clearance: 27.4 mL/min (A) (by C-G formula based on SCr of 1.35 mg/dL (H)).   Patient is candidate for enoxaparin 30mg  every 24 hours based on CrCl <71ml/min or Weight less then 45kg for women or <57kg for men   DESCRIPTION: Pharmacy has adjusted enoxaparin dose per [facility] policy.  Patient is now receiving enoxaparin 30mg  every 24 hours.  [place] Resident 05/17/2019 11:15 AM

## 2019-05-17 NOTE — Progress Notes (Signed)
PROGRESS NOTE    Jimmy Jimenez  [id number] DOB: 09/19/1925 DOA: 05/13/2019 PCP: Crecencio Mc, MD   Brief Narrative:  Jimmy Jimenez is a 86 y.o. 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  He also has had issues with hypersalivation for years.  Patient became more concerned as he has been unable to eat food or keep down his medications.  She was seen in a day prior to admission in ED for similar symptoms, negative CT head and was discharged home to follow-up with his GI. He was hypoxic on presentation requiring 2 L, potassium of 2.9 with lower extremity edema and chest x-ray with interstitial edema.  Admitted for CHF exacerbation.  Subjective: Pt reported no pain, but just tired.  No fever, dyspnea, N/V/D.  Upon discussion with palliative care provider, pt decided to go with hospice.   Assessment & Plan:   Principal Problem:   Acute on chronic congestive heart failure (HCC) Active Problems:   Essential thrombocytosis (HCC)   Essential hypertension   Mass of pancreas   History of CVA (cerebrovascular accident)   Hypokalemia   Dysphagia   Choking sensation   Protein-calorie malnutrition, severe  Acute hypoxic respiratory failure in the setting of acute on chronic diastolic heart failure and history of pulmonary fibrosis.  Patient was unable to take his medications for the past few days due to difficulty swallowing. Saturation improves with diuresis. -d/c IV Lasix 40 mg daily 2/2 Cr increase --Now comfort care measures.  Dysphagia  Swallow evaluation was done, low risk for aspiration but they are recommending GI  consult. He has incomplete study for esophageal gram today as he was unable to drink whole contrast. --EGD found just minor web, no etiology for dysphagia. --comfort care now, will have ice chips and oral intake as tolerated.  Pancreatic cyst/intraductal papillary neoplasm. Patient apparently has an history of pancreatic head cystic mass which is growing in size and CA 19-9 was elevating.  Being investigated by [facility] and oncology.  He was planned for EUS got delayed due to his CVA in January.   --Comfort care now, no further workup  Elevated troponin.  Most likely secondary to demand, started trending down.  Hypokalemia.  Resolved.  History of CVA with right hemiparesis D/c Plavix  Essential thrombocytosis Stable D/c hydroxyurea.  Hypertension D/c home lisinopril    DVT prophylaxis: none, comfort care Code Status: DNR Family Communication: grandson updated at bedside Disposition Plan: to hospice facility when bed available.    Objective: Vitals:   05/16/19 1959 05/16/19 2005 05/17/19 0507 05/17/19 0742  BP: 125/67  (!) 134/59 (!) 148/61  Pulse: (!) 104  92 91  Resp:    20  Temp: 98.8 F (37.1 C)  98.3 F (36.8 C) (!) 97.4 F (36.3 C)  TempSrc: Oral  Oral Axillary  SpO2: 99% 99% 100% 100%  Weight:   54.4 kg   Height:        Intake/Output Summary (Last 24 hours) at 05/17/2019 1901 Last data filed at 05/17/2019 0950 Gross per 24 hour  Intake 0 ml  Output 550 ml  Net -550 ml   Filed Weights   05/16/19 0446 05/16/19 1201  05/17/19 0507  Weight: 57.3 kg 57.3 kg 54.4 kg    Examination:  Constitutional: NAD, AAOx3 HEENT: conjunctivae and lids normal, EOMI CV: RRR no M,R,G. Distal pulses +2.  No cyanosis.   RESP: CTA B/L, normal respiratory effort, on 2L GI: +BS, NTND Extremities: No effusions, edema, or tenderness in BLE SKIN: warm, dry and intact Neuro: II - XII grossly intact.  Sensation intact Psych: Depressed mood and affect.     Consultants:    GI  Procedures:  Antimicrobials:   Data Reviewed: I have personally reviewed following labs and imaging studies  CBC: Recent Labs  Lab 05/12/19 0951 05/13/19 2058 05/14/19 0714 05/15/19 0457 05/17/19 0515  WBC 9.0 10.3 10.3 9.6 18.2*  NEUTROABS  --  7.6  --   --   --   HGB 14.3 13.9 13.9 13.6 14.4  HCT 43.6 42.1 42.5 41.6 45.4  MCV 98.6 96.8 97.5 99.3 101.3*  PLT 434* 416* 436* 414* [id number]*   Basic Metabolic Panel: Recent Labs  Lab 05/13/19 2058 05/14/19 0714 05/15/19 0457 05/16/19 0520 05/17/19 0515  NA 135 138 140 143 144  K 2.9* 2.8* 3.5 3.5 3.9  CL 86* 85* 88* 89* 91*  CO2 37* 35* 35* 32 35*  GLUCOSE 82 58* 60* 79 135*  BUN 13 14 18  26* 30*  CREATININE 0.81 0.90 0.95 1.28* 1.35*  CALCIUM 9.5 9.3 9.3 9.3 9.3  MG  --  1.9  --   --  2.1   GFR: Estimated Creatinine Clearance: 27.4 mL/min (A) (by C-G formula based on SCr of 1.35 mg/dL (H)). Liver Function Tests: Recent Labs  Lab 05/12/19 0951 05/13/19 2058  AST 20 24  ALT 14 12  ALKPHOS 66 71  BILITOT 1.2 1.7*  PROT 6.7 6.6  ALBUMIN 4.0 4.0   No results for input(s): LIPASE, AMYLASE in the last 168 hours. No results for input(s): AMMONIA in the last 168 hours. Coagulation Profile: No results for input(s): INR, PROTIME in the last 168 hours. Cardiac Enzymes: No results for input(s): CKTOTAL, CKMB, CKMBINDEX, TROPONINI in the last 168 hours. BNP (last 3 results) No results for input(s): PROBNP in the last 8760 hours. HbA1C: No results for input(s): HGBA1C in the last 72 hours. CBG: Recent Labs  Lab 05/14/19 0829 05/14/19 0858  GLUCAP 50* 138*   Lipid Profile: No results for input(s): CHOL, HDL, LDLCALC, TRIG, CHOLHDL, LDLDIRECT in the last 72 hours. Thyroid Function Tests: No results for input(s): TSH, T4TOTAL, FREET4, T3FREE, THYROIDAB in the last 72 hours. Anemia Panel: No results for input(s): VITAMINB12, FOLATE, FERRITIN, TIBC, IRON, RETICCTPCT in the last 72 hours. Sepsis Labs: Recent  Labs  Lab 05/17/19 0515  PROCALCITON 0.11    Recent Results (from the past 240 hour(s))  SARS CORONAVIRUS 2 (TAT 6-24 HRS) Nasopharyngeal Nasopharyngeal Swab     Status: None   Collection Time: 05/13/19 10:17 PM   Specimen: Nasopharyngeal Swab  Result Value Ref Range Status   SARS Coronavirus 2 NEGATIVE NEGATIVE Final    Comment: (NOTE) SARS-CoV-2 target nucleic acids are NOT DETECTED. The SARS-CoV-2 RNA is generally detectable in upper and lower respiratory specimens during the acute phase of infection. Negative results do not preclude SARS-CoV-2 infection, do not rule out co-infections with other pathogens, and should not be used as the sole basis for treatment or other patient management decisions. Negative results must be combined with clinical observations, patient history, and epidemiological information. The expected result is Negative. Fact Sheet for Patients: SugarRoll.be Fact Sheet  for Healthcare Providers: https://www.woods-mathews.com/ This test is not yet approved or cleared by the [place] and  has been authorized for detection and/or diagnosis of SARS-CoV-2 by FDA under an Emergency Use Authorization (EUA). This EUA will remain  in effect (meaning this test can be used) for the duration of the COVID-19 declaration under Section 56 4(b)(1) of the Act, 21 U.S.C. section 360bbb-3(b)(1), unless the authorization is terminated or revoked sooner. Performed at [facility], [place] [address] [place], [place] [address]      Radiology Studies: No results found.  Scheduled Meds: . aspirin  324 mg Oral Once  . feeding supplement (ENSURE ENLIVE)  237 mL Oral TID BM  . pantoprazole (PROTONIX) IV  40 mg Intravenous Q12H   Continuous Infusions:    LOS: 4 days    Enzo Bi, MD [facility]  If 7PM-7AM, please contact night-coverage [email or web address]  05/17/2019, 7:01 PM

## 2019-05-17 NOTE — Progress Notes (Signed)
Ch visited with Pt in response to PG, to be with family after Pt was placed on comfort measures. Upon arrival, Pt's wife, Pt's son, and Pt's grandson were present bedside with pt. Pt was alert and coherent, but sad. After some initial conversations Pt's wife requested prayer. Ch prayed with Pt and family. Pt's son and grandson were very emotional and in tears. Pt called grandson by name and said "I love you, I am going to a better place" Pt looks aware of process. Ch let pt and family know about chaplain availability and left.

## 2019-05-17 NOTE — Progress Notes (Signed)
New referral for [facility] hospice home received from [place]. Patient information sent to referral.  PAtient has been approved by the hospice medical director. Writer spoke with patient and his grandson Ronalee Belts in the room. Will contact Mrs. Abeln regarding hospice services and consents in the morning. Bed is available for admission to the hospice home tomorrow. Write to follow up with hospital care team to advise. Thank you for the opportunity to be involved in the care of this patient and his family. Flo Shanks BSN, RN, [place] [phone]

## 2019-05-17 NOTE — TOC Progression Note (Signed)
Transition of Care [facility]) - Progression Note    Patient Details  Name: Jimmy Jimenez MRN: [id number] Date of Birth: [birth year]  Transition of Care [facility]) CM/SW [place], RN Phone Number: 05/17/2019, 11:54 AM  Clinical Narrative:     Per Palliative request, Hospice Home Referral placed with Flo Shanks, RN [facility].    Expected Discharge Plan: [place] Barriers to Discharge: Continued Medical Work up  Expected Discharge Plan and Services Expected Discharge Plan: [place] In-house Referral: Clinical Social Work, Hospice / Palliative Care Discharge Planning Services: CM Consult   Living arrangements for the past 2 months: Single Family Home                                       Social Determinants of Health (SDOH) Interventions    Readmission Risk Interventions Readmission Risk Prevention Plan 03/08/2019  Transportation Screening Complete  PCP or Specialist Appt within 5-7 Days Complete  Home Care Screening Complete  Medication Review (RN CM) Complete  Some recent data might be hidden

## 2019-05-18 MED ORDER — GLYCOPYRROLATE 0.2 MG/ML IJ SOLN: 0.2000 mg | INTRAMUSCULAR | Status: AC | PRN

## 2019-05-18 MED ORDER — ONDANSETRON HCL 4 MG/2ML IJ SOLN: 4.0000 mg | Freq: Four times a day (QID) | INTRAMUSCULAR | 0 refills | Status: AC | PRN

## 2019-05-18 MED ORDER — MORPHINE SULFATE (PF) 2 MG/ML IV SOLN: 1.0000 mg | INTRAVENOUS | 0 refills | Status: AC | PRN

## 2019-05-18 MED ORDER — PANTOPRAZOLE SODIUM 40 MG IV SOLR: 40.0000 mg | Freq: Two times a day (BID) | INTRAVENOUS | Status: AC

## 2019-05-18 MED ORDER — POLYVINYL ALCOHOL 1.4 % OP SOLN: 1.0000 [drp] | Freq: Four times a day (QID) | OPHTHALMIC | 0 refills | Status: AC | PRN

## 2019-05-18 MED ORDER — BIOTENE DRY MOUTH MT LIQD: 15.0000 mL | OROMUCOSAL | Status: AC | PRN

## 2019-05-18 MED ORDER — HALOPERIDOL LACTATE 5 MG/ML IJ SOLN: 0.5000 mg | INTRAMUSCULAR | Status: AC | PRN

## 2019-05-18 MED ORDER — LORAZEPAM 2 MG/ML IJ SOLN: 0.5000 mg | INTRAMUSCULAR | 0 refills | Status: AC | PRN

## 2019-05-18 NOTE — Discharge Summary (Signed)
Physician Discharge Summary   Jimmy Jimenez  male DOB: Jun 01, 1925  VX:[id number]  PCP: Crecencio Mc, MD  Admit date: 05/13/2019 Discharge date: 05/18/2019  Admitted From: home Disposition:  Hospice facility CODE STATUS: DNR  Discharge Instructions    AMB referral to CHF clinic   Complete by: As directed    AMB referral to pulmonary rehabilitation   Complete by: As directed    Please select a program: Respiratory Care Services   Respiratory Care Services Diagnosis: Heart Failure   After initial evaluation and assessments completed: Virtual Based Care may be provided alone or in conjunction with Pulmonary Rehab/Respiratory Care services based on patient barriers.: Yes   Diet - low sodium heart healthy   Complete by: As directed        Hospital Course:  For full details, please see H&P, progress notes, consult notes and ancillary notes.  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 Patient became more concerned as he had been unable to eat food or keep down his medications.    Dysphagia of unclear etiology Swallow evaluation was done, which found low risk for aspiration.  He had incomplete study for esophageal gram as he was unable to drink whole contrast.  EGD found just minor web, no etiology for dysphagia.  Pt didn't want a feeding tube, and given his many other medical issues (including likely pancreatic cancer), pt elected to go with comfort care and hospice.  Palliative care was consulted, and hospice referral made.  Pt was discharged to hospice facility.  Acute hypoxic  respiratory failure in the setting of acuteon chronicdiastolic heart failure and history of pulmonary fibrosis.   Patient was unable to take his medications for the past few days prior to presentation due to difficulty swallowing.  Pt initially received IV diuresis with improved in O2 saturation.  Pt was not discharged on diuretic due to comfort care status.  Pancreatic cyst/intraductal papillary neoplasm. Patient apparently has an history of pancreatic head cystic mass which is growing in size and CA 19-9 was elevating.  Being investigated by [facility] and oncology.  He was planned for EUS got delayed due to his CVA in January.  Pt was made Comfort care now, and will not pursue further workup  Elevated troponin.   Most likely secondary to demand.  Hypokalemia.  Resolved.  History of CVA with right hemiparesis Plavix d/c'ed due to inability to swallow and then comfort care status.  Essential thrombocytosis Stable Home hydroxyurea d/c'ed due to inability to swallow and then comfort care status.  Hypertension home lisinopril d/c'ed due to inability to swallow and then comfort care status.   Discharge Diagnoses:  Principal Problem:   Acute on chronic congestive heart failure (HCC) Active Problems:   Essential thrombocytosis (HCC)   Essential hypertension   Mass of pancreas   History of CVA (cerebrovascular accident)   Hypokalemia   Dysphagia   Choking sensation   Protein-calorie malnutrition, severe    Discharge Instructions:  Allergies as of 05/18/2019      Reactions   Amlodipine    swelling   Atenolol    swelling   Erythromycin    Other  reaction(s): Hallucination   Furosemide    Dizziness   Hctz [hydrochlorothiazide]    hyponatremia   Hydrochlorothiazide W-triamterene    Other reaction(s): Other (See Comments) Patient stated is causes low sodium Patient stated is causes low sodium Patient stated is causes low sodium Patient stated is causes low  sodium Patient stated is causes low sodium Patient stated is causes low sodium   Levofloxacin    Causes sleepiness   Metoprolol    dizziness   Micardis [telmisartan]    Causes swelling   Penicillins Itching   Has patient had a PCN reaction causing immediate rash, facial/tongue/throat swelling, SOB or lightheadedness with hypotension: No Has patient had a PCN reaction causing severe rash involving mucus membranes or skin necrosis: No Has patient had a PCN reaction that required hospitalization No Has patient had a PCN reaction occurring within the last 10 years: No If all of the above answers are "NO", then may proceed with Cephalosporin use.   Terazosin    Causes swelling in legs and feet   Triamterene-hctz    Patient stated is causes low sodium      Medication List    STOP taking these medications   atorvastatin 10 MG tablet Commonly known as: LIPITOR   azelastine 0.1 % nasal spray Commonly known as: ASTELIN   benzonatate 200 MG capsule Commonly known as: TESSALON   clopidogrel 75 MG tablet Commonly known as: PLAVIX   clotrimazole-betamethasone cream Commonly known as: LOTRISONE   cyanocobalamin 1000 MCG tablet   Dermacloud Crea   docusate sodium 100 MG capsule Commonly known as: COLACE   esomeprazole 40 MG capsule Commonly known as: NEXIUM   feeding supplement (ENSURE ENLIVE) Liqd   furosemide 20 MG tablet Commonly known as: LASIX   hydroxyurea 500 MG capsule Commonly known as: HYDREA   lisinopril 40 MG tablet Commonly known as: ZESTRIL   testosterone cypionate 200 MG/ML injection Commonly known as: DEPOTESTOSTERONE CYPIONATE   vitamin C 1000 MG tablet   Vitamin D3 25 MCG (1000 UT) Caps     TAKE these medications   antiseptic oral rinse Liqd Apply 15 mLs topically as needed for dry mouth.   glycopyrrolate 0.2 MG/ML injection Commonly known as: ROBINUL Inject 1 mL (0.2 mg total) into the vein every 4 (four) hours as needed (excessive  secretions).   haloperidol lactate 5 MG/ML injection Commonly known as: HALDOL Inject 0.1 mLs (0.5 mg total) into the vein every 4 (four) hours as needed (or delirium).   LORazepam 2 MG/ML injection Commonly known as: ATIVAN Inject 0.25 mLs (0.5 mg total) into the vein every 4 (four) hours as needed for anxiety.   morphine 2 MG/ML injection Inject 0.5 mLs (1 mg total) into the vein every 2 (two) hours as needed (or dyspnea).   ondansetron 4 MG/2ML Soln injection Commonly known as: ZOFRAN Inject 2 mLs (4 mg total) into the vein every 6 (six) hours as needed for nausea.   pantoprazole 40 MG injection Commonly known as: PROTONIX Inject 40 mg into the vein every 12 (twelve) hours.   polyvinyl alcohol 1.4 % ophthalmic solution Commonly known as: LIQUIFILM TEARS Place 1 drop into both eyes 4 (four) times daily as needed for dry eyes.       Follow-up Information    [place] Follow up on 05/23/2019.   Specialty: Cardiology Why: at 12:30pm. Enter through the [place] entrance Contact information: [place]  [phone]          Allergies  Allergen Reactions  . Amlodipine     swelling  . Atenolol     swelling  . Erythromycin     Other reaction(s): Hallucination  . Furosemide     Dizziness   . Hctz [Hydrochlorothiazide]     hyponatremia  . Hydrochlorothiazide W-Triamterene     Other reaction(s): Other (See Comments) Patient stated is causes low sodium Patient stated is causes low sodium Patient stated is causes low sodium  Patient stated is causes low sodium   Patient stated is causes low sodium Patient stated is causes low sodium  . Levofloxacin     Causes sleepiness  . Metoprolol     dizziness  . Micardis [Telmisartan]     Causes swelling  . Penicillins Itching    Has patient had a PCN reaction causing immediate rash, facial/tongue/throat swelling, SOB  or lightheadedness with hypotension: No Has patient had a PCN reaction causing severe rash involving mucus membranes or skin necrosis: No Has patient had a PCN reaction that required hospitalization No Has patient had a PCN reaction occurring within the last 10 years: No If all of the above answers are "NO", then may proceed with Cephalosporin use.  . Terazosin     Causes swelling in legs and feet  . Triamterene-Hctz     Patient stated is causes low sodium     The results of significant diagnostics from this hospitalization (including imaging, microbiology, ancillary and laboratory) are listed below for reference.   Consultations:   Procedures/Studies: DG Chest 1 View  Result Date: 05/13/2019 CLINICAL DATA:  Shortness of breath. 4+ pitting edema. EXAM: CHEST  1 VIEW COMPARISON:  Two-view chest x-ray 03/01/2019 FINDINGS: The heart is enlarged. Atherosclerotic changes are noted at the aortic arch. Patchy interstitial pattern has increased slightly. Small effusions are suspected. Loop recorder is in place. IMPRESSION: 1. Cardiomegaly with increasing interstitial edema and small effusions compatible with congestive heart failure. 2. Aortic atherosclerosis. Electronically Signed   By: San Morelle M.D.   On: 05/13/2019 21:03   CT Head Wo Contrast  Result Date: 05/12/2019 CLINICAL DATA:  86 year old male with altered mental status and difficulty swallowing. EXAM: CT HEAD WITHOUT CONTRAST TECHNIQUE: Contiguous axial images were obtained from the base of the skull through the vertex without intravenous contrast. COMPARISON:  03/04/2019 MR and prior studies FINDINGS: Brain: No evidence of acute infarction, hemorrhage, hydrocephalus, extra-axial collection or mass lesion/mass effect. Atrophy and chronic small-vessel white matter ischemic changes are again noted. Vascular: Carotid and vertebral atherosclerotic calcifications are noted. Skull: Normal. Negative for fracture or focal lesion.  Sinuses/Orbits: No acute finding. Other: None. IMPRESSION: 1. No evidence of acute intracranial abnormality. 2. Atrophy and chronic small-vessel white matter ischemic changes. Electronically Signed   By: Margarette Canada M.D.   On: 05/12/2019 11:42   [place] Lower Ext Art Right Ltd  Result Date: 04/24/2019 CLINICAL DATA:  86 year old male, status post thrombectomy for left MCA occlusion 03/04/2019. He then developed a right common femoral artery pseudoaneurysm which was treated with ultrasound-guided thrombin injection 03/07/2019. He returns today for repeat duplex EXAM: RIGHT LOWER EXTREMITY ARTERIAL DUPLEX SCAN TECHNIQUE: Gray-scale sonography as well as color Doppler and duplex ultrasound was performed to evaluate the lower extremity arteries . COMPARISON:  None. FINDINGS: Right lower Extremity Directed duplex of the right lower extremity demonstrates triphasic common femoral artery, SFA, profunda femoris, and tibial arteries. Thrombosed pseudoaneurysm identified at the common femoral artery with no  residual flow. IMPRESSION: Waveforms maintained throughout the right lower extremity. Thrombosed right common femoral artery pseudoaneurysm. Signed, Dulcy Fanny. Dellia Nims, RPVI [facility] Electronically Signed   By: Corrie Mckusick D.O.   On: 04/24/2019 10:34   DG UGI W DOUBLE CM (HD BA)  Result Date: 05/15/2019 CLINICAL DATA:  Indigestion. EXAM: UPPER GI SERIES WITHOUT KUB TECHNIQUE: Routine upper GI series was performed with thin density barium. FLUOROSCOPY TIME:  Fluoroscopy Time:  1 minutes 6 seconds Radiation Exposure Index (if provided by the fluoroscopic device): 4.0 mGy Number of Acquired Spot Images: 3 COMPARISON:  Chest x-ray 05/13/2019, 03/01/2019, 08/06/2015. FINDINGS: Patient could only take 1-2 small sips of barium and could not drink anymore due to choking sensation. Upper most portion of the esophagus appears patent and is nondilated. Lower  esophagus and stomach could not be evaluated. Follow-up chest x-ray and KUB can be obtained to demonstrate passage of what little bit of barium the patient was able to swallow. Chronic interstitial pulmonary disease noted. IMPRESSION: Very limited and incomplete exam as above. Electronically Signed   By: Marcello Moores  Register   On: 05/15/2019 09:45   CUP PACEART REMOTE DEVICE CHECK  Result Date: 05/14/2019 [facility] summary report received. Battery status OK. Normal device function. 5 inappropriate AF detections previously reported and evaluated by MD. No new symptom episodes, tachy episodes, brady, or pause episodes. No new AF episodes. Monthly summary reports and ROV/PRN  IR Radiologist Eval & Mgmt  Result Date: 04/24/2019 Please refer to notes tab for details about interventional procedure. (Op Note)     Labs: BNP (last 3 results) Recent Labs    03/01/19 1420 05/13/19 2057  BNP 115.0* [id number]*   Basic Metabolic Panel: Recent Labs  Lab 05/13/19 2058 05/14/19 0714 05/15/19 0457 05/16/19 0520 05/17/19 0515  NA 135 138 140 143 144  K 2.9* 2.8* 3.5 3.5 3.9  CL 86* 85* 88* 89* 91*  CO2 37* 35* 35* 32 35*  GLUCOSE 82 58* 60* 79 135*  BUN 13 14 18  26* 30*  CREATININE 0.81 0.90 0.95 1.28* 1.35*  CALCIUM 9.5 9.3 9.3 9.3 9.3  MG  --  1.9  --   --  2.1   Liver Function Tests: Recent Labs  Lab 05/12/19 0951 05/13/19 2058  AST 20 24  ALT 14 12  ALKPHOS 66 71  BILITOT 1.2 1.7*  PROT 6.7 6.6  ALBUMIN 4.0 4.0   No results for input(s): LIPASE, AMYLASE in the last 168 hours. No results for input(s): AMMONIA in the last 168 hours. CBC: Recent Labs  Lab 05/12/19 0951 05/13/19 2058 05/14/19 0714 05/15/19 0457 05/17/19 0515  WBC 9.0 10.3 10.3 9.6 18.2*  NEUTROABS  --  7.6  --   --   --   HGB 14.3 13.9 13.9 13.6 14.4  HCT 43.6 42.1 42.5 41.6 45.4  MCV 98.6 96.8 97.5 99.3 101.3*  PLT 434* 416* 436* 414* 437*   Cardiac Enzymes: No results for input(s): CKTOTAL, CKMB, CKMBINDEX,  TROPONINI in the last 168 hours. BNP: Invalid input(s): POCBNP CBG: Recent Labs  Lab 05/14/19 0829 05/14/19 0858  GLUCAP 50* 138*   D-Dimer No results for input(s): DDIMER in the last 72 hours. Hgb A1c No results for input(s): HGBA1C in the last 72 hours. Lipid Profile No results for input(s): CHOL, HDL, LDLCALC, TRIG, CHOLHDL, LDLDIRECT in the last 72 hours. Thyroid function studies No results for input(s): TSH, T4TOTAL, T3FREE, THYROIDAB in the last 72 hours.  Invalid input(s): FREET3 Anemia work up No results for input(s): VITAMINB12, FOLATE, FERRITIN, TIBC, IRON, RETICCTPCT in the last 72 hours. Urinalysis    Component Value Date/Time   COLORURINE YELLOW (A) 03/31/2019 0111   APPEARANCEUR CLEAR (A) 03/31/2019 0111   APPEARANCEUR Clear 03/10/2017 1324   LABSPEC >1.046 (H) 03/31/2019 0111   PHURINE 5.0 03/31/2019 0111   GLUCOSEU NEGATIVE 03/31/2019 0111   GLUCOSEU NEGATIVE 08/29/2014 1050   HGBUR NEGATIVE 03/31/2019 0111   BILIRUBINUR NEGATIVE 03/31/2019 0111   BILIRUBINUR Negative 03/10/2017 1324   KETONESUR NEGATIVE 03/31/2019 0111   PROTEINUR NEGATIVE 03/31/2019 0111   UROBILINOGEN 0.2 08/29/2014 1050   UROBILINOGEN 0.2 08/29/2014 1048   NITRITE NEGATIVE 03/31/2019 0111   LEUKOCYTESUR NEGATIVE 03/31/2019 0111   Sepsis Labs Invalid input(s): PROCALCITONIN,  WBC,  LACTICIDVEN Microbiology Recent Results (from the past 240 hour(s))  SARS CORONAVIRUS 2 (TAT 6-24 HRS) Nasopharyngeal Nasopharyngeal Swab     Status: None   Collection Time: 05/13/19 10:17 PM   Specimen: Nasopharyngeal Swab  Result Value Ref Range Status   SARS Coronavirus 2 NEGATIVE NEGATIVE Final    Comment: (NOTE) SARS-CoV-2 target nucleic acids are NOT DETECTED. The SARS-CoV-2 RNA is generally detectable in upper and lower respiratory specimens during the acute phase of infection. Negative results do not preclude SARS-CoV-2 infection, do not rule out co-infections with other pathogens, and  should not be used as the sole basis for treatment or other patient management decisions. Negative results must be combined with clinical observations, patient history, and epidemiological information. The expected result is Negative. Fact Sheet for Patients: SugarRoll.be Fact Sheet for Healthcare Providers: https://www.woods-mathews.com/ This test is not yet approved or cleared by the [place] FDA and  has been authorized for detection and/or diagnosis of SARS-CoV-2 by FDA under an Emergency Use Authorization (EUA). This EUA will remain  in effect (meaning this test can be used) for the duration of the COVID-19 declaration under Section 56 4(b)(1) of the Act, 21 U.S.C. section 360bbb-3(b)(1), unless the authorization is terminated or revoked sooner. Performed at [facility], [place] [address] [place], [place] [address]      Total time spend on discharging this patient, including the last patient exam, discussing the hospital stay, instructions for ongoing care as it relates to all pertinent caregivers, as well as preparing the medical discharge records, prescriptions, and/or referrals as applicable, is 30 minutes.    Enzo Bi, MD  [facility] 05/18/2019, 10:01 AM  If 7PM-7AM, please contact night-coverage

## 2019-05-18 NOTE — Progress Notes (Signed)
ReDS Pro not appropriate for this pt due to BMI < 22.

## 2019-05-18 NOTE — Progress Notes (Signed)
Patient transferred to hospice home via EMS.  Family at bedside.

## 2019-05-18 NOTE — Progress Notes (Signed)
Follow up visit made to new referral for [facility] hospice home. Writer had spoken to patient's wife earlier this morning via telephone to initiate education regarding hospice services, philosophy, team appraoch to care and current visitation policy. Much emotional support give and she is struggling with the decision regarding not taking him home "one more time". She did voice her agreement with the plan for transfer to the hospice home today. Consents have been completed. Jimmy Jimenez remains alert, appeared some what anxious. Jimmy Jimenez remains at bedside. Report called to the hospice home. EMS notified for transport. Signed out of facility DNR in place. Hospital care team all updated. Thank you. Flo Shanks BS, RN, [place] [phone]

## 2019-05-21 ENCOUNTER — Telehealth: Payer: Self-pay | Admitting: Family

## 2019-05-21 NOTE — Telephone Encounter (Signed)
LVM with patient to confirm his NP appointment with the CHF Clinic after we received referral when he was discharged. I also wanted to ask some basic questions since his discharge. LVM asking patient to call back to confirm when able.   Alyse Low, [place]

## 2019-05-22 ENCOUNTER — Telehealth: Payer: Self-pay | Admitting: Internal Medicine

## 2019-05-23 ENCOUNTER — Ambulatory Visit: Payer: No Typology Code available for payment source | Admitting: Family

## 2019-05-24 ENCOUNTER — Ambulatory Visit: Payer: PPO

## 2019-05-28 ENCOUNTER — Ambulatory Visit: Payer: PPO | Admitting: Internal Medicine

## 2019-05-28 ENCOUNTER — Inpatient Hospital Stay: Payer: No Typology Code available for payment source

## 2019-05-28 ENCOUNTER — Inpatient Hospital Stay: Payer: No Typology Code available for payment source | Admitting: Internal Medicine

## 2019-05-31 ENCOUNTER — Telehealth: Payer: PPO

## 2019-05-31 NOTE — Telephone Encounter (Signed)
Dr Derrel Nip aware. Patient changed to deceased in chart.

## 2019-05-31 NOTE — Telephone Encounter (Signed)
Pt's wife called to let us know that he had passed away this morning.

## 2019-05-31 DEATH — deceased

## 2019-06-07 ENCOUNTER — Ambulatory Visit: Payer: No Typology Code available for payment source

## 2019-06-21 ENCOUNTER — Ambulatory Visit: Payer: No Typology Code available for payment source

## 2019-07-18 ENCOUNTER — Ambulatory Visit: Payer: PPO | Admitting: Adult Health

## 2022-02-10 NOTE — Telephone Encounter (Signed)
Med DC'd
